# Patient Record
Sex: Female | Born: 1950
Health system: Southern US, Community
[De-identification: ages and names within clinical notes are randomized; demographics above are authoritative.]

## PROBLEM LIST (undated history)

## (undated) DIAGNOSIS — J189 Pneumonia, unspecified organism: Secondary | ICD-10-CM

## (undated) DIAGNOSIS — Z950 Presence of cardiac pacemaker: Secondary | ICD-10-CM

## (undated) DIAGNOSIS — I679 Cerebrovascular disease, unspecified: Secondary | ICD-10-CM

## (undated) DIAGNOSIS — M1990 Unspecified osteoarthritis, unspecified site: Secondary | ICD-10-CM

## (undated) DIAGNOSIS — Z87442 Personal history of urinary calculi: Secondary | ICD-10-CM

## (undated) DIAGNOSIS — C50919 Malignant neoplasm of unspecified site of unspecified female breast: Secondary | ICD-10-CM

## (undated) DIAGNOSIS — R7303 Prediabetes: Secondary | ICD-10-CM

## (undated) DIAGNOSIS — N189 Chronic kidney disease, unspecified: Secondary | ICD-10-CM

## (undated) DIAGNOSIS — I509 Heart failure, unspecified: Secondary | ICD-10-CM

## (undated) DIAGNOSIS — Z95810 Presence of automatic (implantable) cardiac defibrillator: Secondary | ICD-10-CM

## (undated) DIAGNOSIS — I639 Cerebral infarction, unspecified: Secondary | ICD-10-CM

## (undated) DIAGNOSIS — I428 Other cardiomyopathies: Secondary | ICD-10-CM

## (undated) HISTORY — DX: Malignant neoplasm of unspecified site of unspecified female breast: C50.919

## (undated) HISTORY — PX: ABDOMINAL HYSTERECTOMY: SHX81

## (undated) HISTORY — PX: TONSILLECTOMY: SUR1361

## (undated) HISTORY — PX: VARICOSE VEIN SURGERY: SHX832

## (undated) HISTORY — PX: ESOPHAGOGASTRODUODENOSCOPY: SHX1529

## (undated) HISTORY — DX: Cerebrovascular disease, unspecified: I67.9

## (undated) HISTORY — DX: Other cardiomyopathies: I42.8

## (undated) HISTORY — PX: MASTECTOMY: SHX3

## (undated) HISTORY — PX: COLONOSCOPY: SHX174

## (undated) HISTORY — DX: Heart failure, unspecified: I50.9

## (undated) HISTORY — PX: CHOLECYSTECTOMY: SHX55

## (undated) SURGERY — Surgical Case
Anesthesia: *Unknown

---

## 1993-02-10 HISTORY — PX: BREAST RECONSTRUCTION: SHX9

## 2008-02-11 DIAGNOSIS — I639 Cerebral infarction, unspecified: Secondary | ICD-10-CM

## 2008-02-11 HISTORY — PX: CARDIAC DEFIBRILLATOR PLACEMENT: SHX171

## 2008-02-11 HISTORY — DX: Cerebral infarction, unspecified: I63.9

## 2008-03-09 ENCOUNTER — Encounter: Payer: Self-pay | Admitting: Cardiology

## 2008-03-09 ENCOUNTER — Encounter (INDEPENDENT_AMBULATORY_CARE_PROVIDER_SITE_OTHER): Payer: Self-pay | Admitting: Neurology

## 2008-03-09 ENCOUNTER — Inpatient Hospital Stay (HOSPITAL_COMMUNITY): Admission: AD | Admit: 2008-03-09 | Discharge: 2008-03-13 | Payer: Self-pay | Admitting: Neurology

## 2008-03-09 ENCOUNTER — Ambulatory Visit: Payer: Self-pay | Admitting: Internal Medicine

## 2008-03-10 ENCOUNTER — Encounter: Payer: Self-pay | Admitting: Cardiology

## 2008-03-10 ENCOUNTER — Encounter (INDEPENDENT_AMBULATORY_CARE_PROVIDER_SITE_OTHER): Payer: Self-pay | Admitting: Neurology

## 2008-03-15 ENCOUNTER — Ambulatory Visit: Payer: Self-pay | Admitting: Cardiology

## 2008-03-16 DIAGNOSIS — I509 Heart failure, unspecified: Secondary | ICD-10-CM | POA: Insufficient documentation

## 2008-03-16 DIAGNOSIS — R5383 Other fatigue: Secondary | ICD-10-CM

## 2008-03-16 DIAGNOSIS — R0602 Shortness of breath: Secondary | ICD-10-CM | POA: Insufficient documentation

## 2008-03-16 DIAGNOSIS — R609 Edema, unspecified: Secondary | ICD-10-CM

## 2008-03-16 DIAGNOSIS — R5381 Other malaise: Secondary | ICD-10-CM

## 2008-03-23 ENCOUNTER — Encounter: Payer: Self-pay | Admitting: Nurse Practitioner

## 2008-03-23 ENCOUNTER — Ambulatory Visit: Payer: Self-pay | Admitting: Cardiology

## 2008-03-24 ENCOUNTER — Ambulatory Visit: Payer: Self-pay | Admitting: Cardiology

## 2008-03-30 ENCOUNTER — Ambulatory Visit: Payer: Self-pay

## 2008-03-30 ENCOUNTER — Encounter: Payer: Self-pay | Admitting: Cardiology

## 2008-03-31 ENCOUNTER — Ambulatory Visit: Payer: Self-pay | Admitting: Cardiology

## 2008-04-07 ENCOUNTER — Ambulatory Visit: Payer: Self-pay | Admitting: Cardiology

## 2008-04-11 ENCOUNTER — Encounter: Payer: Self-pay | Admitting: Nurse Practitioner

## 2008-04-11 ENCOUNTER — Ambulatory Visit: Payer: Self-pay | Admitting: Internal Medicine

## 2008-04-11 ENCOUNTER — Encounter: Payer: Self-pay | Admitting: Cardiology

## 2008-04-11 DIAGNOSIS — M542 Cervicalgia: Secondary | ICD-10-CM | POA: Insufficient documentation

## 2008-04-11 LAB — CONVERTED CEMR LAB
BUN: 12 mg/dL (ref 6–23)
Chloride: 103 meq/L (ref 96–112)
Creatinine, Ser: 0.7 mg/dL (ref 0.4–1.2)
GFR calc non Af Amer: 92 mL/min

## 2008-04-14 ENCOUNTER — Ambulatory Visit: Payer: Self-pay | Admitting: Cardiology

## 2008-04-18 ENCOUNTER — Ambulatory Visit: Payer: Self-pay | Admitting: Cardiology

## 2008-04-24 ENCOUNTER — Encounter: Payer: Self-pay | Admitting: Cardiology

## 2008-04-25 ENCOUNTER — Ambulatory Visit: Payer: Self-pay | Admitting: Cardiology

## 2008-04-28 ENCOUNTER — Ambulatory Visit: Payer: Self-pay

## 2008-05-04 ENCOUNTER — Encounter: Admission: RE | Admit: 2008-05-04 | Discharge: 2008-05-04 | Payer: Self-pay | Admitting: Hematology and Oncology

## 2008-05-09 ENCOUNTER — Ambulatory Visit: Payer: Self-pay | Admitting: Cardiology

## 2008-05-10 ENCOUNTER — Ambulatory Visit: Payer: Self-pay | Admitting: Cardiology

## 2008-05-11 HISTORY — PX: PACEMAKER INSERTION: SHX728

## 2008-05-17 ENCOUNTER — Telehealth (INDEPENDENT_AMBULATORY_CARE_PROVIDER_SITE_OTHER): Payer: Self-pay | Admitting: *Deleted

## 2008-05-18 ENCOUNTER — Ambulatory Visit: Payer: Self-pay

## 2008-05-18 ENCOUNTER — Encounter: Payer: Self-pay | Admitting: Cardiology

## 2008-05-18 ENCOUNTER — Encounter: Payer: Self-pay | Admitting: Internal Medicine

## 2008-05-18 ENCOUNTER — Ambulatory Visit: Payer: Self-pay | Admitting: Internal Medicine

## 2008-05-18 DIAGNOSIS — I428 Other cardiomyopathies: Secondary | ICD-10-CM | POA: Insufficient documentation

## 2008-05-18 DIAGNOSIS — I639 Cerebral infarction, unspecified: Secondary | ICD-10-CM | POA: Insufficient documentation

## 2008-05-18 DIAGNOSIS — I6350 Cerebral infarction due to unspecified occlusion or stenosis of unspecified cerebral artery: Secondary | ICD-10-CM | POA: Insufficient documentation

## 2008-05-18 DIAGNOSIS — I9589 Other hypotension: Secondary | ICD-10-CM

## 2008-05-18 DIAGNOSIS — I420 Dilated cardiomyopathy: Secondary | ICD-10-CM | POA: Insufficient documentation

## 2008-05-18 HISTORY — DX: Other hypotension: I95.89

## 2008-05-18 LAB — CONVERTED CEMR LAB
BUN: 20 mg/dL (ref 6–23)
Basophils Absolute: 0 10*3/uL (ref 0.0–0.1)
CO2: 30 meq/L (ref 19–32)
Eosinophils Absolute: 0.1 10*3/uL (ref 0.0–0.7)
GFR calc non Af Amer: 68.6 mL/min (ref >60)
Glucose, Bld: 124 mg/dL — ABNORMAL HIGH (ref 70–99)
HCT: 35.9 % — ABNORMAL LOW (ref 36.0–46.0)
Lymphs Abs: 1.5 10*3/uL (ref 0.7–4.0)
MCHC: 34.5 g/dL (ref 30.0–36.0)
MCV: 85.3 fL (ref 78.0–100.0)
Monocytes Absolute: 0.6 10*3/uL (ref 0.1–1.0)
Monocytes Relative: 9.6 % (ref 3.0–12.0)
Platelets: 183 10*3/uL (ref 150.0–400.0)
Potassium: 4.2 meq/L (ref 3.5–5.1)
Prothrombin Time: 24.6 s — ABNORMAL HIGH (ref 10.9–13.3)
RDW: 15.9 % — ABNORMAL HIGH (ref 11.5–14.6)

## 2008-05-26 ENCOUNTER — Observation Stay (HOSPITAL_COMMUNITY): Admission: AD | Admit: 2008-05-26 | Discharge: 2008-05-27 | Payer: Self-pay | Admitting: Internal Medicine

## 2008-05-26 ENCOUNTER — Ambulatory Visit: Payer: Self-pay | Admitting: Internal Medicine

## 2008-05-26 HISTORY — PX: CARDIAC DEFIBRILLATOR PLACEMENT: SHX171

## 2008-05-27 ENCOUNTER — Encounter: Payer: Self-pay | Admitting: Cardiology

## 2008-05-30 ENCOUNTER — Telehealth: Payer: Self-pay | Admitting: Internal Medicine

## 2008-05-30 ENCOUNTER — Ambulatory Visit: Payer: Self-pay | Admitting: Cardiology

## 2008-05-31 ENCOUNTER — Encounter: Payer: Self-pay | Admitting: Internal Medicine

## 2008-06-05 ENCOUNTER — Encounter: Payer: Self-pay | Admitting: Internal Medicine

## 2008-06-05 ENCOUNTER — Ambulatory Visit: Payer: Self-pay | Admitting: Cardiology

## 2008-06-05 ENCOUNTER — Ambulatory Visit: Payer: Self-pay

## 2008-06-06 ENCOUNTER — Ambulatory Visit: Payer: Self-pay | Admitting: Cardiology

## 2008-06-14 ENCOUNTER — Encounter: Payer: Self-pay | Admitting: Cardiology

## 2008-06-20 ENCOUNTER — Ambulatory Visit: Payer: Self-pay | Admitting: Cardiology

## 2008-06-27 ENCOUNTER — Ambulatory Visit: Payer: Self-pay | Admitting: Cardiology

## 2008-07-11 ENCOUNTER — Ambulatory Visit: Payer: Self-pay | Admitting: Cardiology

## 2008-07-25 ENCOUNTER — Ambulatory Visit: Payer: Self-pay | Admitting: Cardiology

## 2008-08-15 ENCOUNTER — Ambulatory Visit: Payer: Self-pay | Admitting: Cardiology

## 2008-08-23 ENCOUNTER — Ambulatory Visit: Payer: Self-pay | Admitting: Internal Medicine

## 2008-08-24 ENCOUNTER — Encounter: Admission: RE | Admit: 2008-08-24 | Discharge: 2008-08-24 | Payer: Self-pay | Admitting: Neurology

## 2008-08-24 ENCOUNTER — Encounter: Payer: Self-pay | Admitting: Cardiology

## 2008-09-05 ENCOUNTER — Encounter: Payer: Self-pay | Admitting: Cardiology

## 2008-09-19 ENCOUNTER — Ambulatory Visit: Payer: Self-pay | Admitting: Cardiology

## 2008-09-25 ENCOUNTER — Encounter: Payer: Self-pay | Admitting: *Deleted

## 2008-10-20 ENCOUNTER — Ambulatory Visit: Payer: Self-pay | Admitting: Cardiology

## 2008-11-26 ENCOUNTER — Encounter: Payer: Self-pay | Admitting: Internal Medicine

## 2008-11-27 ENCOUNTER — Ambulatory Visit: Payer: Self-pay | Admitting: Internal Medicine

## 2008-11-28 ENCOUNTER — Ambulatory Visit: Payer: Self-pay | Admitting: Cardiology

## 2008-12-18 ENCOUNTER — Encounter: Payer: Self-pay | Admitting: Internal Medicine

## 2008-12-26 ENCOUNTER — Ambulatory Visit: Payer: Self-pay | Admitting: Cardiology

## 2009-01-26 ENCOUNTER — Ambulatory Visit: Payer: Self-pay | Admitting: Cardiology

## 2009-01-26 LAB — CONVERTED CEMR LAB: POC INR: 2.7

## 2009-02-15 ENCOUNTER — Encounter: Payer: Self-pay | Admitting: Cardiology

## 2009-02-19 ENCOUNTER — Encounter: Payer: Self-pay | Admitting: Cardiology

## 2009-02-25 ENCOUNTER — Encounter: Payer: Self-pay | Admitting: Internal Medicine

## 2009-02-26 ENCOUNTER — Ambulatory Visit: Payer: Self-pay | Admitting: Internal Medicine

## 2009-03-06 ENCOUNTER — Ambulatory Visit: Payer: Self-pay | Admitting: Cardiology

## 2009-03-06 DIAGNOSIS — Z95810 Presence of automatic (implantable) cardiac defibrillator: Secondary | ICD-10-CM | POA: Insufficient documentation

## 2009-03-06 LAB — CONVERTED CEMR LAB: POC INR: 2.9

## 2009-03-07 ENCOUNTER — Encounter: Payer: Self-pay | Admitting: Internal Medicine

## 2009-04-06 ENCOUNTER — Ambulatory Visit: Payer: Self-pay | Admitting: Cardiology

## 2009-04-06 LAB — CONVERTED CEMR LAB: POC INR: 2.9

## 2009-04-24 ENCOUNTER — Encounter: Payer: Self-pay | Admitting: Cardiology

## 2009-05-15 ENCOUNTER — Ambulatory Visit: Payer: Self-pay | Admitting: Cardiology

## 2009-05-15 LAB — CONVERTED CEMR LAB: POC INR: 2.2

## 2009-06-12 ENCOUNTER — Ambulatory Visit: Payer: Self-pay | Admitting: Internal Medicine

## 2009-06-12 ENCOUNTER — Ambulatory Visit: Payer: Self-pay | Admitting: Cardiology

## 2009-06-12 DIAGNOSIS — I5022 Chronic systolic (congestive) heart failure: Secondary | ICD-10-CM

## 2009-06-12 HISTORY — DX: Chronic systolic (congestive) heart failure: I50.22

## 2009-06-12 LAB — CONVERTED CEMR LAB: POC INR: 2.3

## 2009-07-10 ENCOUNTER — Ambulatory Visit: Payer: Self-pay | Admitting: Cardiology

## 2009-07-10 LAB — CONVERTED CEMR LAB: POC INR: 2.3

## 2009-08-07 ENCOUNTER — Ambulatory Visit: Payer: Self-pay | Admitting: Cardiology

## 2009-08-14 ENCOUNTER — Telehealth (INDEPENDENT_AMBULATORY_CARE_PROVIDER_SITE_OTHER): Payer: Self-pay | Admitting: *Deleted

## 2009-09-04 ENCOUNTER — Ambulatory Visit: Payer: Self-pay | Admitting: Cardiology

## 2009-09-12 ENCOUNTER — Encounter: Payer: Self-pay | Admitting: Internal Medicine

## 2009-09-13 ENCOUNTER — Ambulatory Visit: Payer: Self-pay | Admitting: Internal Medicine

## 2009-09-28 ENCOUNTER — Encounter: Payer: Self-pay | Admitting: Internal Medicine

## 2009-10-02 ENCOUNTER — Ambulatory Visit: Payer: Self-pay | Admitting: Cardiology

## 2009-10-30 ENCOUNTER — Ambulatory Visit: Payer: Self-pay | Admitting: Cardiology

## 2009-11-27 ENCOUNTER — Ambulatory Visit: Payer: Self-pay | Admitting: Cardiology

## 2009-12-13 ENCOUNTER — Ambulatory Visit: Payer: Self-pay | Admitting: Internal Medicine

## 2009-12-14 ENCOUNTER — Encounter: Payer: Self-pay | Admitting: Internal Medicine

## 2009-12-24 ENCOUNTER — Encounter: Payer: Self-pay | Admitting: Internal Medicine

## 2009-12-25 ENCOUNTER — Ambulatory Visit: Payer: Self-pay | Admitting: Cardiology

## 2009-12-25 LAB — CONVERTED CEMR LAB: POC INR: 3.2

## 2010-01-22 ENCOUNTER — Ambulatory Visit: Payer: Self-pay | Admitting: Cardiology

## 2010-01-22 LAB — CONVERTED CEMR LAB: POC INR: 1.8

## 2010-02-19 ENCOUNTER — Ambulatory Visit: Admission: RE | Admit: 2010-02-19 | Discharge: 2010-02-19 | Payer: Self-pay | Source: Home / Self Care

## 2010-02-19 ENCOUNTER — Encounter: Payer: Self-pay | Admitting: Cardiology

## 2010-02-19 LAB — CONVERTED CEMR LAB: POC INR: 3.4

## 2010-03-08 ENCOUNTER — Ambulatory Visit
Admission: RE | Admit: 2010-03-08 | Discharge: 2010-03-08 | Payer: Self-pay | Source: Home / Self Care | Attending: Cardiology | Admitting: Cardiology

## 2010-03-08 ENCOUNTER — Encounter: Payer: Self-pay | Admitting: Cardiology

## 2010-03-12 NOTE — Assessment & Plan Note (Signed)
Summary: 6 MO FU DEC REMINDER-SRS   Visit Type:  Follow-up Referring Provider:  Dr Gala Romney Primary Provider:  Reuel Boom  CC:  follow-up visit.  History of Present Illness: the patient is a 60 year old female with history of nonischemic cardiomyopathy ejection fraction 25%. The patient status post ICD implant. She is in NYHA class I. She is on appropriate medical therapy for heart failure. The patient feels much improved. She denies any chest pain orthopnea PND palpitations or syncope. The patient has been taking drives by herself to [facility]. She has been visiting her grandson.  Patient's also been evaluated by neurology for possible small cerebral aneurysm. However CT scan did not demonstrate an aneurysm. She does have a small area of encephalomalacia or possible prior embolic event. The patient is on Coumadin therapy.  Clinical Review Panels:  Lab Work BUN 20 (05/18/2008) Creatinine 0.9 (05/18/2008)  EKG EKG Impression No significant ST segment change suggestive of ischemia. (05/18/2008)  CXR CXR results IMPRESSION:   Mild cardiomegaly.  No active lung disease.  Mild peribronchial   thickening. (03/08/2008)  Echocardiogram Echocardiogram   LEFT VENTRICLE:   -  The left ventricle was mildly dilated.   -  Overall left ventricular systolic function was markedly         decreased.   -  Left ventricular ejection fraction was estimated , range being 25         % to 30 %.   -  There was severe diffuse left ventricular hypokinesis.   -  Left ventricular wall thickness was normal.     Doppler interpretation(s):   -  Features were consistent with mild diastolic dysfunction.    AORTIC VALVE:   -  The aortic valve was trileaflet.   -  Aortic valve thickness was normal.   -  There was normal aortic valve leaflet excursion.     Doppler interpretation(s):   -  Transaortic velocity was within the normal range.   -  There was no evidence for aortic valve stenosis.  -  There was no significant aortic valvular regurgitation.    AORTA:   -  The aortic root was normal in size.    MITRAL VALVE:   -  There was mild thickening of the mitral valve.     Doppler interpretation(s):   -  There was trivial mitral valvular regurgitation.    LEFT ATRIUM:   -  Left atrial size was normal.    -  There was an atrial septal aneurysm.    RIGHT VENTRICLE:   -  Right ventricular size was normal.   -  Right ventricular systolic function was normal.    PULMONIC VALVE:   Doppler interpretation(s):   -  There was no significant pulmonic regurgitation.    TRICUSPID VALVE:   Doppler interpretation(s):   -  There was trivial tricuspid valvular regurgitation.    RIGHT ATRIUM:   -  Right atrial size was normal.    PERICARDIUM:   -  There was a minimal pericardial effusion, without hemodynamic         compromise. (05/18/2008)  Carotid Studies Carotid Doppler Results SUMMARY:   -  Mild mix plaque noted throughout bilaterally.    -  Vertebral artery flow antegrade bilaterally.   -  No significant right ICA stenosis noted.   -  No significant left ICA stenosis noted. (03/08/2008)    Preventive Screening-Counseling & Management  Alcohol-Tobacco     Smoking Status: never  Current  Problems (verified): 1)  Cva  (ICD-434.91) 2)  Hypotension, Chronic  (ICD-458.1) 3)  Cardiomyopathy, Primary, Dilated  (ICD-425.4) 4)  Neck Pain  (ICD-723.1) 5)  Fatigue / Malaise  (ICD-780.79) 6)  Long Term Use of Aspirin  (ICD-V58.66) 7)  Long Term Antiplatelet Therapy  (ICD-V58.63) 8)  Dyspnea  (ICD-786.05) 9)  Edema  (ICD-782.3) 10)  Congestive Heart Failure, Unspecified  (ICD-428.0)  Current Medications (verified): 1)  Adult Aspirin Ec Low Strength 81 Mg Tbec (Aspirin) .... Qd 2)  Toprol Xl 50 Mg Xr24h-Tab (Metoprolol Succinate) .Marland Kitchen.. 1 Tab Once Daily 3)  Furosemide 20 Mg Tabs (Furosemide) .Marland Kitchen.. 1 Tab Once Daily As Needed 4)  Lanoxin 0.125 Mg Tabs (Digoxin) .... Qd 5)   Warfarin Sodium 5 Mg Tabs (Warfarin Sodium) .... Take As Directed 6)  Lisinopril 10 Mg Tabs (Lisinopril) .... Take 1  Tablet By Mouth Daily 7)  Spironolactone 25 Mg Tabs (Spironolactone) .... Take 1/2 Tablet By Mouth Daily 8)  Macrodantin 50 Mg Caps (Nitrofurantoin Macrocrystal) .... Take 1 Tablet By Mouth Once A Day  Allergies (verified): No Known Drug Allergies  Comments:  Nurse/Medical Assistant: The patient's medications and allergies were reviewed with the patient and were updated in the Medication and Allergy Lists. List reviewed.  Past History:  Past Medical History: Last updated: 03/16/2008 Cancer (Breast) txt'd with Adriamycin 1990's Cerebrovascular Disease S/P Right MCA cardioemoblic infarct  03/09/08 Current Problems:  FATIGUE / MALAISE (ICD-780.79) LONG TERM USE OF ASPIRIN (ICD-V58.66) LONG TERM ANTIPLATELET THERAPY (ICD-V58.63) DYSPNEA (ICD-786.05) EDEMA (ICD-782.3) CONGESTIVE HEART FAILURE, UNSPECIFIED (ICD-428.0)  Family History: Last updated: 03/16/2008 Family History of Cardiomyopathy:  Brother S/P heart transplant  Family History of CVA or Stroke:   Social History: Last updated: 03/16/2008 Married lives in [place] w/husband Tobacco Use - No.  Alcohol Use - no Drug Use - no  Review of Systems  The patient denies fatigue, malaise, fever, weight gain/loss, vision loss, decreased hearing, hoarseness, chest pain, palpitations, shortness of breath, prolonged cough, wheezing, sleep apnea, coughing up blood, abdominal pain, blood in stool, nausea, vomiting, diarrhea, heartburn, incontinence, blood in urine, muscle weakness, joint pain, leg swelling, rash, skin lesions, headache, fainting, dizziness, depression, anxiety, enlarged lymph nodes, easy bruising or bleeding, and environmental allergies.    Vital Signs:  Patient profile:   60 year old female Height:      68 inches Weight:      153 pounds BMI:     23.35 Pulse rate:   66 / minute BP sitting:   103 / 69   (left arm) Cuff size:   regular  Vitals Entered By: Carlye Grippe (March 06, 2009 10:03 AM) CC: follow-up visit   Physical Exam  Additional Exam:  General: Well-developed, well-nourished in no distress head: Normocephalic and atraumatic eyes PERRLA/EOMI intact, conjunctiva and lids normal nose: No deformity or lesions mouth normal dentition, normal posterior pharynx neck: Supple, no JVD.  No masses, thyromegaly or abnormal cervical nodes. No carotid bruits lungs: Normal breath sounds bilaterally without wheezing.  Normal percussion heart: regular rate and rhythm with normal S1 and S2, no S3 or S4.  PMI is normal.  No pathological murmurs abdomen: Normal bowel sounds, abdomen is soft and nontender without masses, organomegaly or hernias noted.  No hepatosplenomegaly musculoskeletal: Back normal, normal gait muscle strength and tone normal pulsus: Pulse is normal in all 4 extremities Extremities: No peripheral pitting edema neurologic: Alert and oriented x 3 skin: Intact without lesions or rashes cervical nodes: No significant adenopathy psychologic: Normal affect  EKG  Procedure date:  03/06/2009  Findings:      atrial paced rhythm normal ventricular conduction. Heart rate 64 beats per minute PR interval 182 ms QRS duration 122 ms   ICD Specifications Following MD:  Sherryl Manges, MD     Referring MD:  [facility] ICD Vendor:  [employer]     ICD Model Number:  [id number]     ICD Serial Number:  [id number] ICD DOI:  05/26/2008     ICD Implanting MD:  Sherryl Manges, MD  Lead 1:    Location: RA     DOI: 05/26/2008     Model #: [id number]     Serial #: [id number]     Status: active Lead 2:    Location: RV     DOI: 05/26/2008     Model #: [id number]     Serial #: [id number]     Status: active  Indications::  NICM   ICD Follow Up ICD Dependent:  No      Episodes Coumadin:  No  Brady Parameters Mode DDI     Lower Rate Limit:  60     PAV 300      Tachy Zones VF:  181     VT:  214     VT1:  240      Impression & Recommendations:  Problem # 1:  CARDIOMYOPATHY, PRIMARY, DILATED (ICD-425.4) patient NYHA class I. She has AV sequential pacing. Her QRS interval 1 20 ms . Currently no definite indication for CRT upgrade.the patient is on appropriate therapy for heart failure. On her next visit we will obtain a 2-D echocardiogramlaboratory work from February 17, 1999 were reviewed. There was global renal function. Potassium on Aldactone is 3.9 and normal CBC was within normal limits LDL is 102 with an HDL cholesterol 50 The following medications were removed from the medication list:    Aldactone 25 Mg Tabs (Spironolactone) .Marland Kitchen... Take 1 tablet by mouth once a day Her updated medication list for this problem includes:    Adult Aspirin Ec Low Strength 81 Mg Tbec (Aspirin) ..... Qd    Toprol Xl 50 Mg Xr24h-tab (Metoprolol succinate) .Marland Kitchen... 1 tab once daily    Furosemide 20 Mg Tabs (Furosemide) .Marland Kitchen... 1 tab once daily as needed    Lanoxin 0.125 Mg Tabs (Digoxin) ..... Qd    Warfarin Sodium 5 Mg Tabs (Warfarin sodium) .Marland Kitchen... Take as directed    Lisinopril 10 Mg Tabs (Lisinopril) .Marland Kitchen... Take 1  tablet by mouth daily    Spironolactone 25 Mg Tabs (Spironolactone) .Marland Kitchen... Take 1/2 tablet by mouth daily  Problem # 2:  CVA (ICD-434.91) the patient has small area of encephalomalacia on her CT scan but no evidence of aneurysm. The patient will continue on Coumadin. She reports no complications. Her updated medication list for this problem includes:    Adult Aspirin Ec Low Strength 81 Mg Tbec (Aspirin) ..... Qd    Warfarin Sodium 5 Mg Tabs (Warfarin sodium) .Marland Kitchen... Take as directed  Problem # 3:  IMPLANTATION OF DEFIBRILLATOR, HX OF (ICD-V45.02) the patient has dual-chamber defibrillator. There was atrial pacing with normal ventricular conduction. We will stagger to patient's future appointments every 6 months with Dr. Graciela Husbands and myself.  Other Orders: EKG w/ Interpretation (93000)  Patient Instructions: 1)   Your physician recommends that you continue on your current medications as directed. Please refer to the Current Medication list given to you today. 2)  Follow up in  1 year.

## 2010-03-12 NOTE — Medication Information (Signed)
Summary: ccr-lr  Anticoagulant Therapy  Managed by: Vashti Hey, RN Referring MD: Donnella Bi PCP: Billie Lade MD: Diona Browner MD, Remi Deter Indication 1: CVA--stroke (ICD-436) Indication 2: Cerebral Artery Thrombosis (ICD-434.0) [facility] Site: [place] INR POC 3.2  Dietary changes: no    Health status changes: no    Bleeding/hemorrhagic complications: no    Recent/future hospitalizations: no    Any changes in medication regimen? no    Recent/future dental: no  Any missed doses?: no       Is patient compliant with meds? yes       Allergies: No Known Drug Allergies  Anticoagulation Management History:      The patient is taking warfarin and comes in today for a routine follow up visit.  Positive risk factors for bleeding include history of CVA/TIA.  Negative risk factors for bleeding include an age less than 59 years old.  The bleeding index is 'intermediate risk'.  Positive CHADS2 values include History of CHF and Prior Stroke/CVA/TIA.  Negative CHADS2 values include Age > 59 years old.  The start date was 03/10/2008.  Her last INR was 2.4 ratio.  Anticoagulation responsible provider: Diona Browner MD, Remi Deter.  INR POC: 3.2.  Cuvette Lot#: [id number].  Exp: 10/11.    Anticoagulation Management Assessment/Plan:      The patient's current anticoagulation dose is Warfarin sodium 5 mg tabs: take as directed.  The target INR is 2 - 3.  The next INR is due 01/22/2010.  Anticoagulation instructions were given to patient.  Results were reviewed/authorized by Vashti Hey, RN.  She was notified by Vashti Hey RN.         Prior Anticoagulation Instructions: INR 3.1 Continue coumadin 5mg  once daily except 2.5mg  on Mondays and Fridays  Current Anticoagulation Instructions: INR 3.2 Continue coumadin 5mg  once daily except 2.5mg  on Mondays and Fridays Increase greens

## 2010-03-12 NOTE — Cardiovascular Report (Signed)
Summary: Office Visit Remote   Office Visit Remote   Imported By: Roderic Ovens 10/01/2009 13:21:16  _____________________________________________________________________  External Attachment:    Type:   Image     Comment:   External Document

## 2010-03-12 NOTE — Medication Information (Signed)
Summary: ccr-lr  Anticoagulant Therapy  Managed by: Vashti Hey, RN Referring MD: Donnella Bi PCP: Billie Lade MD: Antoine Poche MD, Fayrene Fearing Indication 1: CVA--stroke (ICD-436) Indication 2: Cerebral Artery Thrombosis (ICD-434.0) Lab [facility] Site: [place] INR POC 2.5  Dietary changes: no    Health status changes: no    Bleeding/hemorrhagic complications: no    Recent/future hospitalizations: no    Any changes in medication regimen? no    Recent/future dental: no  Any missed doses?: no       Is patient compliant with meds? yes       Allergies: No Known Drug Allergies  Anticoagulation Management History:      The patient is taking warfarin and comes in today for a routine follow up visit.  Positive risk factors for bleeding include history of CVA/TIA.  Negative risk factors for bleeding include an age less than 24 years old.  The bleeding index is 'intermediate risk'.  Positive CHADS2 values include History of CHF and Prior Stroke/CVA/TIA.  Negative CHADS2 values include Age > 69 years old.  The start date was 03/10/2008.  Her last INR was 2.4 ratio.  Anticoagulation responsible provider: Antoine Poche MD, Fayrene Fearing.  INR POC: 2.5.  Cuvette Lot#: [id number].  Exp: 10/11.    Anticoagulation Management Assessment/Plan:      The patient's current anticoagulation dose is Warfarin sodium 5 mg tabs: take as directed.  The target INR is 2 - 3.  The next INR is due 08/07/2009.  Anticoagulation instructions were given to patient.  Results were reviewed/authorized by Vashti Hey, RN.  She was notified by Vashti Hey RN.         Prior Anticoagulation Instructions: INR 2.3 Continue coumadin 5mg  once daily except 2.5mg  on Mondays and Fridays  Current Anticoagulation Instructions: INR 2.5 Continue coumadin 5mg  once daily except 2.5mg  on Mondays and Fridays

## 2010-03-12 NOTE — Medication Information (Signed)
Summary: ccr-lr  Anticoagulant Therapy  Managed by: Vashti Hey, RN Referring MD: Donnella Bi PCP: Billie Lade MD: Andee Lineman MD, Michelle Piper Indication 1: CVA--stroke (ICD-436) Indication 2: Cerebral Artery Thrombosis (ICD-434.0) Lab [facility] [place] Site: [place] INR POC 3.4  Dietary changes: no    Health status changes: no    Bleeding/hemorrhagic complications: no    Recent/future hospitalizations: no    Any changes in medication regimen? no    Recent/future dental: no  Any missed doses?: no       Is patient compliant with meds? yes       Allergies: No Known Drug Allergies  Anticoagulation Management History:      The patient is taking warfarin and comes in today for a routine follow up visit.  Positive risk factors for bleeding include history of CVA/TIA.  Negative risk factors for bleeding include an age less than 29 years old.  The bleeding index is 'intermediate risk'.  Positive CHADS2 values include History of CHF and Prior Stroke/CVA/TIA.  Negative CHADS2 values include Age > 24 years old.  The start date was 03/10/2008.  Her last INR was 2.4 ratio.  Anticoagulation responsible provider: Andee Lineman MD, Michelle Piper.  INR POC: 3.4.  Cuvette Lot#: [id number].  Exp: 10/11.    Anticoagulation Management Assessment/Plan:      The patient's current anticoagulation dose is Warfarin sodium 5 mg tabs: take as directed.  The target INR is 2 - 3.  The next INR is due 10/30/2009.  Anticoagulation instructions were given to patient.  Results were reviewed/authorized by Vashti Hey, RN.  She was notified by Vashti Hey RN.         Prior Anticoagulation Instructions: INR 2.5 Continue coumadin 5mg  once daily except 2.5mg  on Mondays and Fridays  Current Anticoagulation Instructions: INR 3.4 Hold coumadin tonight then resume 5mg  once daily except 2.5mg  on Mondays and Fridays

## 2010-03-12 NOTE — Letter (Signed)
Summary: [name] [name] [facility] OFFICE NOTE  [facility] OFFICE NOTE   Imported By: Claudette Laws 05/01/2009 09:14:11  _____________________________________________________________________  External Attachment:    Type:   Image     Comment:   External Document

## 2010-03-12 NOTE — Medication Information (Signed)
Summary: ccr-lr  Anticoagulant Therapy  Managed by: Vashti Hey, RN Referring MD: Donnella Bi PCP: Lia Hopping Supervising MD: Andee Lineman MD, Michelle Piper Indication 1: CVA--stroke (ICD-436) Indication 2: Cerebral Artery Thrombosis (ICD-434.0) Lab Used: [facility] [place] Site: [place] INR POC 2.9  Dietary changes: no    Health status changes: no    Bleeding/hemorrhagic complications: no    Recent/future hospitalizations: no    Any changes in medication regimen? no    Recent/future dental: no  Any missed doses?: no       Is patient compliant with meds? yes       Allergies: No Known Drug Allergies  Anticoagulation Management History:      The patient is taking warfarin and comes in today for a routine follow up visit.  Positive risk factors for bleeding include history of CVA/TIA.  Negative risk factors for bleeding include an age less than 75 years old.  The bleeding index is 'intermediate risk'.  Positive CHADS2 values include History of CHF and Prior Stroke/CVA/TIA.  Negative CHADS2 values include Age > 13 years old.  The start date was 03/10/2008.  Her last INR was 2.4 ratio.  Anticoagulation responsible provider: Andee Lineman MD, Michelle Piper.  INR POC: 2.9.  Cuvette Lot#: [id number].  Exp: 10/11.    Anticoagulation Management Assessment/Plan:      The patient's current anticoagulation dose is Warfarin sodium 5 mg tabs: take as directed.  The target INR is 2 - 3.  The next INR is due 04/06/2009.  Anticoagulation instructions were given to patient.  Results were reviewed/authorized by Vashti Hey, RN.  She was notified by Vashti Hey RN.         Prior Anticoagulation Instructions: INR 2.7 Continue coumadin 5mg  once daily except 2.5mg  on Mondays and Fridays Pt requests recheck on 03/06/09 at Dr Andee Lineman appt  Current Anticoagulation Instructions: INR 2.9 Continue coumadin 5mg  once daily except 2.5mg  on Mondays and Fridays

## 2010-03-12 NOTE — Medication Information (Signed)
Summary: ccr-lr  Anticoagulant Therapy  Managed by: Vashti Hey, RN Referring MD: Donnella Bi PCP: Billie Lade MD: Antoine Poche MD, Fayrene Fearing Indication 1: CVA--stroke (ICD-436) Indication 2: Cerebral Artery Thrombosis (ICD-434.0) Lab [facility]: [facility] [place] Site: [place] INR POC 2.0  Dietary changes: no    Health status changes: no    Bleeding/hemorrhagic complications: no    Recent/future hospitalizations: no    Any changes in medication regimen? no    Recent/future dental: no  Any missed doses?: yes     Details: missed 1 dose last week  Is patient compliant with meds? yes       Allergies: No Known Drug Allergies  Anticoagulation Management History:      The patient is taking warfarin and comes in today for a routine follow up visit.  Positive risk factors for bleeding include history of CVA/TIA.  Negative risk factors for bleeding include an age less than 50 years old.  The bleeding index is 'intermediate risk'.  Positive CHADS2 values include History of CHF and Prior Stroke/CVA/TIA.  Negative CHADS2 values include Age > 77 years old.  The start date was 03/10/2008.  Her last INR was 2.4 ratio.  Anticoagulation responsible provider: Antoine Poche MD, Fayrene Fearing.  INR POC: 2.0.  Cuvette Lot#: [id number].  Exp: 10/11.    Anticoagulation Management Assessment/Plan:      The patient's current anticoagulation dose is Warfarin sodium 5 mg tabs: take as directed.  The target INR is 2 - 3.  The next INR is due 11/27/2009.  Anticoagulation instructions were given to patient.  Results were reviewed/authorized by Vashti Hey, RN.  She was notified by Vashti Hey RN.         Prior Anticoagulation Instructions: INR 3.4 Hold coumadin tonight then resume 5mg  once daily except 2.5mg  on Mondays and Fridays  Current Anticoagulation Instructions: INR 2.0 Take coumadin 7.5mg  tonight then resume 5mg  once daily except 2.5mg  on Mondays and Fridays

## 2010-03-12 NOTE — Medication Information (Signed)
Summary: ccr-lr  Anticoagulant Therapy  Managed by: Vashti Hey, RN Referring MD: Donnella Bi PCP: Billie Lade MD: Andee Lineman MD, Michelle Piper Indication 1: CVA--stroke (ICD-436) Indication 2: Cerebral Artery Thrombosis (ICD-434.0) Lab [facility] [place] Site: [place] INR POC 2.5  Dietary changes: no    Health status changes: no    Bleeding/hemorrhagic complications: no    Recent/future hospitalizations: no    Any changes in medication regimen? no    Recent/future dental: no  Any missed doses?: no       Is patient compliant with meds? yes       Allergies: No Known Drug Allergies  Anticoagulation Management History:      The patient is taking warfarin and comes in today for a routine follow up visit.  Positive risk factors for bleeding include history of CVA/TIA.  Negative risk factors for bleeding include an age less than 39 years old.  The bleeding index is 'intermediate risk'.  Positive CHADS2 values include History of CHF and Prior Stroke/CVA/TIA.  Negative CHADS2 values include Age > 23 years old.  The start date was 03/10/2008.  Her last INR was 2.4 ratio.  Anticoagulation responsible provider: Andee Lineman MD, Michelle Piper.  INR POC: 2.5.  Cuvette Lot#: [id number].  Exp: 10/11.    Anticoagulation Management Assessment/Plan:      The patient's current anticoagulation dose is Warfarin sodium 5 mg tabs: take as directed.  The target INR is 2 - 3.  The next INR is due 10/02/2009.  Anticoagulation instructions were given to patient.  Results were reviewed/authorized by Vashti Hey, RN.  She was notified by Vashti Hey RN.         Prior Anticoagulation Instructions: INR 2.5 Continue coumadin 5mg  once daily except 2.5mg  on Mondays and Fridays  Current Anticoagulation Instructions: Same as Prior Instructions.

## 2010-03-12 NOTE — Cardiovascular Report (Signed)
Summary: Office Visit   Office Visit   Imported By: Roderic Ovens 06/15/2009 14:18:10  _____________________________________________________________________  External Attachment:    Type:   Image     Comment:   External Document

## 2010-03-12 NOTE — Medication Information (Signed)
Summary: ccr-lr  Anticoagulant Therapy  Managed by: Vashti Hey, RN Referring MD: Donnella Bi PCP: Billie Lade MD: Myrtis Ser MD, Tinnie Gens Indication 1: CVA--stroke (ICD-436) Indication 2: Cerebral Artery Thrombosis (ICD-434.0) [facility] Site: [facility] INR POC 2.9  Dietary changes: no    Health status changes: no    Bleeding/hemorrhagic complications: no    Recent/future hospitalizations: no    Any changes in medication regimen? no    Recent/future dental: no  Any missed doses?: no       Is patient compliant with meds? yes       Allergies: No Known Drug Allergies  Anticoagulation Management History:      The patient is taking warfarin and comes in today for a routine follow up visit.  Positive risk factors for bleeding include history of CVA/TIA.  Negative risk factors for bleeding include an age less than 6 years old.  The bleeding index is 'intermediate risk'.  Positive CHADS2 values include History of CHF and Prior Stroke/CVA/TIA.  Negative CHADS2 values include Age > 9 years old.  The start date was 03/10/2008.  Her last INR was 2.4 ratio.  Anticoagulation responsible provider: Myrtis Ser MD, Tinnie Gens.  INR POC: 2.9.  Cuvette Lot#: [id number].  Exp: 10/11.    Anticoagulation Management Assessment/Plan:      The patient's current anticoagulation dose is Warfarin sodium 5 mg tabs: take as directed.  The target INR is 2 - 3.  The next INR is due 05/08/2009.  Anticoagulation instructions were given to patient.  Results were reviewed/authorized by Vashti Hey, RN.  She was notified by Vashti Hey RN.         Prior Anticoagulation Instructions: INR 2.9 Continue coumadin 5mg  once daily except 2.5mg  on Mondays and Fridays  Current Anticoagulation Instructions: Same as Prior Instructions.

## 2010-03-12 NOTE — Medication Information (Signed)
Summary: ccr-lr  Anticoagulant Therapy  Managed by: [name] Hey, RN Referring MD: Donnella Bi PCP: Billie Lade MD: Diona Browner MD, Remi Deter Indication 1: CVA--stroke (ICD-436) Indication 2: Cerebral Artery Thrombosis (ICD-434.0) Lab [facility] Site: [facility] INR POC 2.3  Dietary changes: no    Health status changes: no    Bleeding/hemorrhagic complications: no    Recent/future hospitalizations: no    Any changes in medication regimen? no    Recent/future dental: no  Any missed doses?: no       Is patient compliant with meds? yes       Allergies: No Known Drug Allergies  Anticoagulation Management History:      The patient is taking warfarin and comes in today for a routine follow up visit.  Positive risk factors for bleeding include history of CVA/TIA.  Negative risk factors for bleeding include an age less than 108 years old.  The bleeding index is 'intermediate risk'.  Positive CHADS2 values include History of CHF and Prior Stroke/CVA/TIA.  Negative CHADS2 values include Age > 11 years old.  The start date was 03/10/2008.  Her last INR was 2.4 ratio.  Anticoagulation responsible provider: Diona Browner MD, Remi Deter.  INR POC: 2.3.  Cuvette Lot#: [id number].  Exp: 10/11.    Anticoagulation Management Assessment/Plan:      The patient's current anticoagulation dose is Warfarin sodium 5 mg tabs: take as directed.  The target INR is 2 - 3.  The next INR is due 07/10/2009.  Anticoagulation instructions were given to patient.  Results were reviewed/authorized by [name] Hey, RN.  She was notified by [name] Hey RN.         Prior Anticoagulation Instructions: INR 2.2 Continue coumadin 5mg  once daily except 2.5mg  on M,F  Current Anticoagulation Instructions: INR 2.3 Continue coumadin 5mg  once daily except 2.5mg  on M,F

## 2010-03-12 NOTE — Progress Notes (Signed)
Summary: REQUEST REFILL K+  Phone Note Outgoing Call   Call placed by: Carlye Grippe,  August 14, 2009 8:53 AM Call placed to: Patient Summary of Call: Called patient inquiring about the request for K+ and that it wasn't on her list when she came in last to see [facility]. Patient advised nurse that she spoke with MD last week out in hallway and informed him that she was having some additional swelling and was now using the lasix daily and she said MD informed her to make sure she uses her K+ along with this. Nurse advised patient that a refill would have to be authorized by a provider since MD was out of the office to confirm this.  recommend a BMET prior to adding supplemental K.Nelida Meuse, PA-C  August 15, 2009 12:41 PM  Initial call taken by: Carlye Grippe,  August 14, 2009 8:55 AM  Follow-up for Phone Call        left message on machine to call office.    Follow-up by: Carlye Grippe,  August 15, 2009 2:31 PM  Additional Follow-up for Phone Call Additional follow up Details #1::        Patient informed of the above and does'nt want labs and wants message to be sent to [facility] on next week upon his return to the office.  Additional Follow-up by: Carlye Grippe,  August 15, 2009 4:14 PM    Additional Follow-up for Phone Call Additional follow up Details #2::    Tell patient she does not need KDur because she is on spironolactone. I was probably not aware of this when I spoke to her.  Lewayne Bunting, MD, [facility]  August 20, 2009 11:31 AM    Appended Document: REQUEST REFILL K+ Left message to return call.  Appended Document: REQUEST REFILL K+ Patient notified.

## 2010-03-12 NOTE — Letter (Signed)
Summary: Remote Device Check  [facility], Main Office  [address]   [place], [place] [address]   Phone: [phone]  Fax: [phone]     March 07, 2009 MRN: [id number]   [name] [name] [address] [place], [place]  [address]   Dear [name],   Your remote transmission was recieved and reviewed by your physician.  All diagnostics were within normal limits for you.    ___X___Your next office visit is scheduled for:  APRIL 2011. Dr Graciela Husbands is no longer seeing patients in our [place] office.  His partner, Dr Johney Frame, has appointment availability in [place].  If you would prefer to be seen in [place], please call [phone] to schedule an appointment with Dr Johney Frame.  If you would prefer to see Dr Graciela Husbands in [place], please call [phone].         Sincerely,  Proofreader

## 2010-03-12 NOTE — Medication Information (Signed)
Summary: ccr-lr  Anticoagulant Therapy  Managed by: Vashti Hey, RN Referring MD: Donnella Bi PCP: Billie Lade MD: Antoine Poche MD, Fayrene Fearing Indication 1: CVA--stroke (ICD-436) Indication 2: Cerebral Artery Thrombosis (ICD-434.0) Lab [facility] Site: [facility] INR POC 2.2  Dietary changes: no    Health status changes: no    Bleeding/hemorrhagic complications: no    Recent/future hospitalizations: no    Any changes in medication regimen? no    Recent/future dental: no  Any missed doses?: yes     Details: missed 1 dose 10 days ago  Is patient compliant with meds? yes       Allergies: No Known Drug Allergies  Anticoagulation Management History:      The patient is taking warfarin and comes in today for a routine follow up visit.  Positive risk factors for bleeding include history of CVA/TIA.  Negative risk factors for bleeding include an age less than 47 years old.  The bleeding index is 'intermediate risk'.  Positive CHADS2 values include History of CHF and Prior Stroke/CVA/TIA.  Negative CHADS2 values include Age > 42 years old.  The start date was 03/10/2008.  Her last INR was 2.4 ratio.  Anticoagulation responsible provider: Antoine Poche MD, Fayrene Fearing.  INR POC: 2.2.  Cuvette Lot#: [id number].  Exp: 10/11.    Anticoagulation Management Assessment/Plan:      The patient's current anticoagulation dose is Warfarin sodium 5 mg tabs: take as directed.  The target INR is 2 - 3.  The next INR is due 06/12/2009.  Anticoagulation instructions were given to patient.  Results were reviewed/authorized by Vashti Hey, RN.  She was notified by Vashti Hey RN.         Prior Anticoagulation Instructions: INR 2.9 Continue coumadin 5mg  once daily except 2.5mg  on Mondays and Fridays  Current Anticoagulation Instructions: INR 2.2 Continue coumadin 5mg  once daily except 2.5mg  on M,F

## 2010-03-12 NOTE — Medication Information (Signed)
Summary: ccr-lr  Anticoagulant Therapy  Managed by: Vashti Hey, RN Referring MD: Donnella Bi PCP: Billie Lade MD: Antoine Poche MD, Fayrene Fearing Indication 1: CVA--stroke (ICD-436) Indication 2: Cerebral Artery Thrombosis (ICD-434.0) Lab [facility] Site: [facility] INR POC 2.3  Dietary changes: no    Health status changes: no    Bleeding/hemorrhagic complications: no    Recent/future hospitalizations: no    Any changes in medication regimen? no    Recent/future dental: no  Any missed doses?: yes     Details: missed 1 dose  Is patient compliant with meds? yes       Allergies: No Known Drug Allergies  Anticoagulation Management History:      The patient is taking warfarin and comes in today for a routine follow up visit.  Positive risk factors for bleeding include history of CVA/TIA.  Negative risk factors for bleeding include an age less than 10 years old.  The bleeding index is 'intermediate risk'.  Positive CHADS2 values include History of CHF and Prior Stroke/CVA/TIA.  Negative CHADS2 values include Age > 18 years old.  The start date was 03/10/2008.  Her last INR was 2.4 ratio.  Anticoagulation responsible provider: Antoine Poche MD, Fayrene Fearing.  INR POC: 2.3.  Cuvette Lot#: [id number].  Exp: 10/11.    Anticoagulation Management Assessment/Plan:      The patient's current anticoagulation dose is Warfarin sodium 5 mg tabs: take as directed.  The target INR is 2 - 3.  The next INR is due 08/07/2009.  Anticoagulation instructions were given to patient.  Results were reviewed/authorized by Vashti Hey, RN.  She was notified by Vashti Hey RN.         Prior Anticoagulation Instructions: INR 2.3 Continue coumadin 5mg  once daily except 2.5mg  on M,F  Current Anticoagulation Instructions: INR 2.3 Continue coumadin 5mg  once daily except 2.5mg  on Mondays and Fridays

## 2010-03-12 NOTE — Letter (Signed)
Summary: Remote Device Check  [facility], Main Office  [address]   [place], [place] [address]   Phone: [phone]  Fax: [phone]     December 24, 2009 MRN: [id number]   ANDREINA OUTTEN [address] [place], [place]  [address]   Dear Ms. CLAGG,   Your remote transmission was recieved and reviewed by your physician.  All diagnostics were within normal limits for you.  __X___Your next transmission is scheduled for:  03-14-2010.  Please transmit at any time this day.  If you have a wireless device your transmission will be sent automatically.   Sincerely,  Vella Kohler

## 2010-03-12 NOTE — Cardiovascular Report (Signed)
Summary: [facility]/ OFFICE NOTE  [facility]/ OFFICE NOTE   Imported By: Dorise Hiss 03/05/2009 16:41:09  _____________________________________________________________________  External Attachment:    Type:   Image     Comment:   External Document

## 2010-03-12 NOTE — Assessment & Plan Note (Signed)
Summary: defib [email or web address]   Referring Provider:  Dr Gala Romney Primary Provider:  Reuel Boom   History of Present Illness: as is [name] [name] is seen in followup history of nonischemic cardiomyopathy ejection fraction 25%. The patient status post ICD implant. She is in NYHA class I. She is on appropriate medical therapy for heart failure. The patient denies SOB, chest pain, edema or palpitations   Current Medications (verified): 1)  Adult Aspirin Ec Low Strength 81 Mg Tbec (Aspirin) .... Qd 2)  Toprol Xl 50 Mg Xr24h-Tab (Metoprolol Succinate) .Marland Kitchen.. 1 Tab Once Daily 3)  Furosemide 20 Mg Tabs (Furosemide) .Marland Kitchen.. 1 Tab Once Daily As Needed 4)  Lanoxin 0.125 Mg Tabs (Digoxin) .... Take 1 Tablet By Mouth Once A Day 5)  Warfarin Sodium 5 Mg Tabs (Warfarin Sodium) .... Take As Directed 6)  Lisinopril 10 Mg Tabs (Lisinopril) .... Take 1  Tablet By Mouth Daily 7)  Spironolactone 25 Mg Tabs (Spironolactone) .... Take 1/2 Tablet By Mouth Daily 8)  Macrodantin 50 Mg Caps (Nitrofurantoin Macrocrystal) .... Take 1 Tablet By Mouth Once A Day  Allergies (verified): No Known Drug Allergies  Past History:  Past Medical History: Last updated: 03/16/2008 Cancer (Breast) txt'd with Adriamycin 1990's Cerebrovascular Disease S/P Right MCA cardioemoblic infarct  03/09/08 Current Problems:  FATIGUE / MALAISE (ICD-780.79) LONG TERM USE OF ASPIRIN (ICD-V58.66) LONG TERM ANTIPLATELET THERAPY (ICD-V58.63) DYSPNEA (ICD-786.05) EDEMA (ICD-782.3) CONGESTIVE HEART FAILURE, UNSPECIFIED (ICD-428.0)  Family History: Last updated: 03/16/2008 Family History of Cardiomyopathy:  Brother S/P heart transplant  Family History of CVA or Stroke:   Social History: Last updated: 03/16/2008 Married lives in [place] w/husband Tobacco Use - No.  Alcohol Use - no Drug Use - no  Vital Signs:  Patient profile:   60 year old female Height:      68 inches Weight:      156 pounds BMI:     23.81 Pulse rate:   67 /  minute Pulse rhythm:   regular BP sitting:   105 / 63  (left arm) Cuff size:   regular  Vitals Entered By: Judithe Modest CMA (Jun 12, 2009 12:43 PM)  Physical Exam  General:  The patient was alert and oriented in no acute distress. HEENT Normal.  Neck veins were flat, carotids were brisk.  Lungs were clear.  Heart sounds were regular without murmurs or gallops.  Abdomen was soft with active bowel sounds. There is no clubbing cyanosis or edema. Skin Warm and dry    PPM Specifications Following MD:  Sherryl Manges, MD       PPM Follow Up Remote Check?  No   PPM Device Measurements Atrium  Threshold:   V at   msec Right Ventricle  Threshold:   V at   msec  Parameters Mode:  DDDR      ICD Specifications Following MD:  Sherryl Manges, MD     Referring MD:  [facility] ICD Vendor:  [employer]     ICD Model Number:  [id number]     ICD Serial Number:  [id number] ICD DOI:  05/26/2008     ICD Implanting MD:  Sherryl Manges, MD  Lead 1:    Location: RA     DOI: 05/26/2008     Model #: [id number]     Serial #: [id number]     Status: active Lead 2:    Location: RV     DOI: 05/26/2008     Model #: [id number]     Serial #: [id number]  Status: active  Indications::  NICM   ICD Follow Up Battery Voltage:  3.19 V     Charge Time:  12.4 seconds     Battery Est. Longevity:  6.1-6.7 YRS Underlying rhythm:  SR ICD Dependent:  No       ICD Device Measurements Atrium:  Amplitude: 3.1 mV, Impedance: 440 ohms, Threshold: 0.5 V at 0.5 msec Right Ventricle:  Amplitude: 12.0  mV, Impedance: 540 ohms, Threshold: 0.75 V at 0.5 msec Shock Impedance: 65 ohms   Episodes MS Episodes:  0     Percent Mode Switch:  0     Coumadin:  No Shock:  0     ATP:  0     Nonsustained:  0     Atrial Therapies:  0 Atrial Pacing:  39%     Ventricular Pacing:  5.6%  Brady Parameters Mode DDI     Lower Rate Limit:  60     PAV 300      Tachy Zones VF:  240     VT:  214     VT1:  181     Next Remote Date:  09/13/2009     Tech Comments:   NORMAL DEVICE FUNCTION .  NO EPISODES.  NO CHANGES MADE. MERLIN CHECK 09-13-09. KRISTIN BANKSTON  Impression & Recommendations:  Problem # 1:  IMPLANTATION OF DEFIBRILLATOR, HX OF (ICD-V45.02) Device parameters and data were reviewed and no changes were made  Problem # 2:  CARDIOMYOPATHY, PRIMARY, DILATED (ICD-425.4) stable on her current extensive medications Her updated medication list for this problem includes:    Adult Aspirin Ec Low Strength 81 Mg Tbec (Aspirin) ..... Qd    Toprol Xl 50 Mg Xr24h-tab (Metoprolol succinate) .Marland Kitchen... 1 tab once daily    Furosemide 20 Mg Tabs (Furosemide) .Marland Kitchen... 1 tab once daily as needed    Lanoxin 0.125 Mg Tabs (Digoxin) .Marland Kitchen... Take 1 tablet by mouth once a day    Warfarin Sodium 5 Mg Tabs (Warfarin sodium) .Marland Kitchen... Take as directed    Lisinopril 10 Mg Tabs (Lisinopril) .Marland Kitchen... Take 1  tablet by mouth daily    Spironolactone 25 Mg Tabs (Spironolactone) .Marland Kitchen... Take 1/2 tablet by mouth daily  Problem # 3:  SYSTOLIC HEART FAILURE, CHRONIC (ICD-428.22) patient has stable class II heart failure on her current medications. Her updated medication list for this problem includes:    Adult Aspirin Ec Low Strength 81 Mg Tbec (Aspirin) ..... Qd    Toprol Xl 50 Mg Xr24h-tab (Metoprolol succinate) .Marland Kitchen... 1 tab once daily    Furosemide 20 Mg Tabs (Furosemide) .Marland Kitchen... 1 tab once daily as needed    Lanoxin 0.125 Mg Tabs (Digoxin) .Marland Kitchen... Take 1 tablet by mouth once a day    Warfarin Sodium 5 Mg Tabs (Warfarin sodium) .Marland Kitchen... Take as directed    Lisinopril 10 Mg Tabs (Lisinopril) .Marland Kitchen... Take 1  tablet by mouth daily    Spironolactone 25 Mg Tabs (Spironolactone) .Marland Kitchen... Take 1/2 tablet by mouth daily

## 2010-03-12 NOTE — Medication Information (Signed)
Summary: ccr-lr  Anticoagulant Therapy  Managed by: Vashti Hey, RN Referring MD: Donnella Bi PCP: Billie Lade MD: Andee Lineman MD, Michelle Piper Indication 1: CVA--stroke (ICD-436) Indication 2: Cerebral Artery Thrombosis (ICD-434.0) [facility]  Site: [facility] INR POC 3.1  Dietary changes: no    Health status changes: no    Bleeding/hemorrhagic complications: no    Recent/future hospitalizations: no    Any changes in medication regimen? no    Recent/future dental: no  Any missed doses?: no       Is patient compliant with meds? yes       Allergies: No Known Drug Allergies  Anticoagulation Management History:      The patient is taking warfarin and comes in today for a routine follow up visit.  Positive risk factors for bleeding include history of CVA/TIA.  Negative risk factors for bleeding include an age less than 80 years old.  The bleeding index is 'intermediate risk'.  Positive CHADS2 values include History of CHF and Prior Stroke/CVA/TIA.  Negative CHADS2 values include Age > 20 years old.  The start date was 03/10/2008.  Her last INR was 2.4 ratio.  Anticoagulation responsible provider: Andee Lineman MD, Michelle Piper.  INR POC: 3.1.  Cuvette Lot#: [id number].  Exp: 10/11.    Anticoagulation Management Assessment/Plan:      The patient's current anticoagulation dose is Warfarin sodium 5 mg tabs: take as directed.  The target INR is 2 - 3.  The next INR is due 12/25/2009.  Anticoagulation instructions were given to patient.  Results were reviewed/authorized by Vashti Hey, RN.  She was notified by Vashti Hey RN.         Prior Anticoagulation Instructions: INR 2.0 Take coumadin 7.5mg  tonight then resume 5mg  once daily except 2.5mg  on Mondays and Fridays  Current Anticoagulation Instructions: INR 3.1 Continue coumadin 5mg  once daily except 2.5mg  on Mondays and Fridays

## 2010-03-12 NOTE — Letter (Signed)
Summary: Remote Device Check  [facility], Main Office  [address]   [place], [place] [address]   Phone: [phone]  Fax: [phone]     September 28, 2009 MRN: [id number]   [name] [name] [address] [place], [place]  [address]   Dear Ms. WILCOXSON,   Your remote transmission was recieved and reviewed by your physician.  All diagnostics were within normal limits for you.  __X___Your next transmission is scheduled for:  12-13-2009.  Please transmit at any time this day.  If you have a wireless device your transmission will be sent automatically.   Sincerely,  Vella Kohler

## 2010-03-12 NOTE — Cardiovascular Report (Signed)
Summary: Office Visit Remote   Office Visit   Imported By: Roderic Ovens 03/07/2009 12:17:53  _____________________________________________________________________  External Attachment:    Type:   Image     Comment:   External Document

## 2010-03-12 NOTE — Cardiovascular Report (Signed)
Summary: Office Visit Remote   Office Visit Remote   Imported By: Roderic Ovens 12/24/2009 15:12:17  _____________________________________________________________________  External Attachment:    Type:   Image     Comment:   External Document

## 2010-03-12 NOTE — Letter (Signed)
Summary: External Correspondence/ NOTE DR. DANIEL  External Correspondence/ NOTE DR. DANIEL   Imported By: Dorise Hiss 02/22/2009 08:28:18  _____________________________________________________________________  External Attachment:    Type:   Image     Comment:   External Document

## 2010-03-14 ENCOUNTER — Encounter: Payer: Self-pay | Admitting: Cardiology

## 2010-03-14 ENCOUNTER — Encounter: Payer: Self-pay | Admitting: Internal Medicine

## 2010-03-14 ENCOUNTER — Ambulatory Visit: Admit: 2010-03-14 | Payer: Self-pay | Admitting: Internal Medicine

## 2010-03-14 DIAGNOSIS — I059 Rheumatic mitral valve disease, unspecified: Secondary | ICD-10-CM

## 2010-03-14 NOTE — Medication Information (Signed)
Summary: ccr-lr  Anticoagulant Therapy  Managed by: Vashti Hey, RN Referring MD: Donnella Bi PCP: Billie Lade MD: Andee Lineman MD, Michelle Piper Indication 1: CVA--stroke (ICD-436) Indication 2: Cerebral Artery Thrombosis (ICD-434.0) Lab [facility] [place] Site: [facility] INR POC 1.8  Dietary changes: no    Health status changes: no    Bleeding/hemorrhagic complications: no    Recent/future hospitalizations: no    Any changes in medication regimen? no    Recent/future dental: no  Any missed doses?: yes     Details: Missed 1/2 dose  Is patient compliant with meds? yes       Allergies: No Known Drug Allergies  Anticoagulation Management History:      The patient is taking warfarin and comes in today for a routine follow up visit.  Positive risk factors for bleeding include history of CVA/TIA.  Negative risk factors for bleeding include an age less than 48 years old.  The bleeding index is 'intermediate risk'.  Positive CHADS2 values include History of CHF and Prior Stroke/CVA/TIA.  Negative CHADS2 values include Age > 59 years old.  The start date was 03/10/2008.  Her last INR was 2.4 ratio.  Anticoagulation responsible provider: Andee Lineman MD, Michelle Piper.  INR POC: 1.8.  Cuvette Lot#: [id number].  Exp: 10/11.    Anticoagulation Management Assessment/Plan:      The patient's current anticoagulation dose is Warfarin sodium 5 mg tabs: take as directed.  The target INR is 2 - 3.  The next INR is due 02/19/2010.  Anticoagulation instructions were given to patient.  Results were reviewed/authorized by Vashti Hey, RN.  She was notified by Vashti Hey RN.         Prior Anticoagulation Instructions: INR 3.2 Continue coumadin 5mg  once daily except 2.5mg  on Mondays and Fridays Increase greens  Current Anticoagulation Instructions: INR 1.8 Missed 1 dose Take coumadin 1 1/2 tablets tonight then resume 1 tablet once daily except 1/2 tablet on Mondays and Fridays

## 2010-03-14 NOTE — Letter (Signed)
Summary: External Correspondence/ OFFICE VISIT [facility]  External Correspondence/ OFFICE VISIT [facility]   Imported By: Dorise Hiss 02/26/2010 12:41:10  _____________________________________________________________________  External Attachment:    Type:   Image     Comment:   External Document

## 2010-03-14 NOTE — Assessment & Plan Note (Addendum)
Summary: 1 yr fu per jan reminder -srs   Visit Type:  Follow-up Referring Provider:  Dr Gala Romney Primary Provider:  Reuel Boom   History of Present Illness: the patient is a 60 year old female with a history of nonischemic cardiomyopathy ejection fraction 20 to 25%.  The patient has a family history of dilated cardiomyopathy but in addition she also received in the 90s several cycles of Adriamycin.  Fourthly she remains in NYHA class one.  She has noticed that he sleep with some fluids however and at times takes extra Lasix.  She started taking 20 mg of Lasix daily.  The patient denies any shortness of breath.  She has no orthopnea PND.  She does have occasional brief chest pains which are very atypical.  Check Cardiolite stress study done in the past which was entirely within normal limits.  The patient never had a cardiac catheterization.  She has a low risk profile for significant coronary artery disease.  She reports no defibrillator discharges.  Preventive Screening-Counseling & Management  Alcohol-Tobacco     Smoking Status: never  Current Medications (verified): 1)  Adult Aspirin Ec Low Strength 81 Mg Tbec (Aspirin) .... Qd 2)  Toprol Xl 50 Mg Xr24h-Tab (Metoprolol Succinate) .Marland Kitchen.. 1 Tab Once Daily 3)  Furosemide 20 Mg Tabs (Furosemide) .Marland Kitchen.. 1 Tab Once Daily As Needed 4)  Lanoxin 0.125 Mg Tabs (Digoxin) .... Take 1 Tablet By Mouth Once A Day 5)  Warfarin Sodium 5 Mg Tabs (Warfarin Sodium) .... Take As Directed 6)  Lisinopril 10 Mg Tabs (Lisinopril) .... Take 1  Tablet By Mouth Daily 7)  Spironolactone 25 Mg Tabs (Spironolactone) .... Take 1/2 Tablet By Mouth Daily 8)  Macrodantin 50 Mg Caps (Nitrofurantoin Macrocrystal) .... Take 1 Tablet By Mouth Once A Day  Allergies (verified): No Known Drug Allergies  Comments:  Nurse/Medical Assistant: The patient's medication list and allergies were reviewed with the patient and were updated in the Medication and Allergy Lists.  Past  History:  Past Medical History: Last updated: 03/16/2008 Cancer (Breast) txt'd with Adriamycin 1990's Cerebrovascular Disease S/P Right MCA cardioemoblic infarct  03/09/08 Current Problems:  FATIGUE / MALAISE (ICD-780.79) LONG TERM USE OF ASPIRIN (ICD-V58.66) LONG TERM ANTIPLATELET THERAPY (ICD-V58.63) DYSPNEA (ICD-786.05) EDEMA (ICD-782.3) CONGESTIVE HEART FAILURE, UNSPECIFIED (ICD-428.0)  Family History: Last updated: 03/16/2008 Family History of Cardiomyopathy:  Brother S/P heart transplant  Family History of CVA or Stroke:   Social History: Last updated: 03/16/2008 Married lives in [place] w/husband Tobacco Use - No.  Alcohol Use - no Drug Use - no  Risk Factors: Smoking Status: never (03/08/2010)  Review of Systems       The patient complains of weight gain/loss.  The patient denies fatigue, malaise, fever, vision loss, decreased hearing, hoarseness, chest pain, palpitations, shortness of breath, prolonged cough, wheezing, sleep apnea, coughing up blood, abdominal pain, blood in stool, nausea, vomiting, diarrhea, heartburn, incontinence, blood in urine, muscle weakness, joint pain, leg swelling, rash, skin lesions, headache, fainting, dizziness, depression, anxiety, enlarged lymph nodes, easy bruising or bleeding, and environmental allergies.    Vital Signs:  Patient profile:   60 year old female Height:      68 inches Weight:      160 pounds Pulse rate:   83 / minute BP sitting:   99 / 64  (left arm) Cuff size:   regular  Vitals Entered By: Carlye Grippe (March 08, 2010 10:42 AM)  Physical Exam  Additional Exam:  General: Well-developed, well-nourished in no distress head: Normocephalic  and atraumatic eyes PERRLA/EOMI intact, conjunctiva and lids normal nose: No deformity or lesions mouth normal dentition, normal posterior pharynx neck: Supple, no JVD.  No masses, thyromegaly or abnormal cervical nodes. No carotid bruits lungs: Normal breath sounds  bilaterally without wheezing.  Normal percussion heart: regular rate and rhythm with normal S1 and S2, no S3 or S4.  PMI is normal.  No pathological murmurs abdomen: Normal bowel sounds, abdomen is soft and nontender without masses, organomegaly or hernias noted.  No hepatosplenomegaly musculoskeletal: Back normal, normal gait muscle strength and tone normal pulsus: Pulse is normal in all 4 extremities Extremities: trace peripheral pitting edema neurologic: Alert and oriented x 3 skin: Intact without lesions or rashes cervical nodes: No significant adenopathy psychologic: Normal affect    PPM Specifications Following MD:  Sherryl Manges, MD       Parameters Mode:  DDDR      ICD Specifications Following MD:  Sherryl Manges, MD     Referring MD:  [facility] ICD Vendor:  St Jude     ICD Model Number:  ZO1096-04     ICD Serial Number:  [id number] ICD DOI:  05/26/2008     ICD Implanting MD:  Sherryl Manges, MD  Lead 1:    Location: RA     DOI: 05/26/2008     Model #: [id number]     Serial #: [id number]     Status: active Lead 2:    Location: RV     DOI: 05/26/2008     Model #: [id number]     Serial #: [id number]     Status: active  Indications::  NICM   ICD Follow Up ICD Dependent:  No      Episodes Coumadin:  No  Brady Parameters Mode DDI     Lower Rate Limit:  60     PAV 300      Tachy Zones VF:  240     VT:  214     VT1:  181     Impression & Recommendations:  Problem # 1:  SYSTOLIC HEART FAILURE, CHRONIC (ICD-428.22) stable but with some easy increase in weight and volume status.  I asked the patient to increase her Lasix to 40 milligrams p.o. daily.  We will check an electrolyte panel in one week.  She was instructed not to take any potassium due to the fact that she is on Aldactone therapy.  The patient also needs follow-up echocardiogram to reassess her LV function and this will also be scheduled for next week. Her updated medication list for this problem includes:    Adult Aspirin Ec Low Strength  81 Mg Tbec (Aspirin) ..... Qd    Toprol Xl 50 Mg Xr24h-tab (Metoprolol succinate) .Marland Kitchen... 1 tab once daily    Furosemide 40 Mg Tabs (Furosemide) .Marland Kitchen... Take one tablet by mouth daily.    Lanoxin 0.125 Mg Tabs (Digoxin) .Marland Kitchen... Take 1 tablet by mouth once a day    Warfarin Sodium 5 Mg Tabs (Warfarin sodium) .Marland Kitchen... Take as directed    Lisinopril 10 Mg Tabs (Lisinopril) .Marland Kitchen... Take 1  tablet by mouth daily    Spironolactone 25 Mg Tabs (Spironolactone) .Marland Kitchen... Take 1/2 tablet by mouth daily  Orders: EKG w/ Interpretation (93000) T-Basic Metabolic Panel (84132-44010) T-BNP  (B Natriuretic Peptide) (27253-66440) 2-D Echocardiogram (2D Echo)  Problem # 2:  HYPOTENSION, CHRONIC (ICD-458.1) blood pressure stable and is asymptomatic.  No further changes in medical therapy required.  Problem # 3:  IMPLANTATION OF DEFIBRILLATOR, HX  OF (ICD-V45.02) patient is followed by EP service.  She has had no discharges. Orders: EKG w/ Interpretation (93000)  Patient Instructions: 1)  Your physician wants you to follow-up in: 6 months. You will receive a reminder letter in the mail one-two months in advance. If you don't receive a letter, please call our office to schedule the follow-up appointment. 2)  Increase Lasix (furosemide) to 40mg  by mouth once daily. 3)  Your physician recommends that you go to the [facility] for lab work: DO LABS NEXT WEEK. 4)  Your physician has requested that you have an echocardiogram.  Echocardiography is a painless test that uses sound waves to create images of your heart. It provides your doctor with information about the size and shape of your heart and how well your heart's chambers and valves are working.  This procedure takes approximately one hour. There are no restrictions for this procedure. Prescriptions: FUROSEMIDE 40 MG TABS (FUROSEMIDE) Take one tablet by mouth daily.  #30 x 6   Entered by:   Cyril Loosen, RN, BSN   Authorized by:   Lewayne Bunting, MD, [facility]   Signed by:    Cyril Loosen, RN, BSN on 03/08/2010   Method used:   Electronically to        [facility] [address] (retail)       [address]       [place], [place]  [address]       Ph: [phone] or [phone]       Fax: [phone]   RxID:   [id number]

## 2010-03-14 NOTE — Medication Information (Signed)
Summary: ccr-lr  Anticoagulant Therapy  Managed by: Vashti Hey, RN Referring MD: Donnella Bi PCP: Billie Lade MD: Andee Lineman MD, Michelle Piper Indication 1: CVA--stroke (ICD-436) Indication 2: Cerebral Artery Thrombosis (ICD-434.0) Lab [facility] [place] Site: [place] INR POC 3.4  Dietary changes: yes       Details: ate less Vit K foods this month  Health status changes: no    Bleeding/hemorrhagic complications: no    Recent/future hospitalizations: no    Any changes in medication regimen? no    Recent/future dental: no  Any missed doses?: no       Is patient compliant with meds? yes       Allergies: No Known Drug Allergies  Anticoagulation Management History:      The patient is taking warfarin and comes in today for a routine follow up visit.  Positive risk factors for bleeding include history of CVA/TIA.  Negative risk factors for bleeding include an age less than 69 years old.  The bleeding index is 'intermediate risk'.  Positive CHADS2 values include History of CHF and Prior Stroke/CVA/TIA.  Negative CHADS2 values include Age > 59 years old.  The start date was 03/10/2008.  Her last INR was 2.4 ratio.  Anticoagulation responsible provider: Andee Lineman MD, Michelle Piper.  INR POC: 3.4.  Cuvette Lot#: [id number].  Exp: 10/11.    Anticoagulation Management Assessment/Plan:      The patient's current anticoagulation dose is Warfarin sodium 5 mg tabs: take as directed.  The target INR is 2 - 3.  The next INR is due 03/19/2010.  Anticoagulation instructions were given to patient.  Results were reviewed/authorized by Vashti Hey, RN.  She was notified by Vashti Hey RN.         Prior Anticoagulation Instructions: INR 1.8 Missed 1 dose Take coumadin 1 1/2 tablets tonight then resume 1 tablet once daily except 1/2 tablet on Mondays and Fridays  Current Anticoagulation Instructions: INR 3.4 Take coumadin 1/2 tablet tonight then resume 1 tablet once daily except 1/2 tablets on Mondays and  Fridays Increase greens/salads

## 2010-03-18 ENCOUNTER — Encounter (INDEPENDENT_AMBULATORY_CARE_PROVIDER_SITE_OTHER): Payer: Self-pay | Admitting: *Deleted

## 2010-03-19 ENCOUNTER — Encounter (INDEPENDENT_AMBULATORY_CARE_PROVIDER_SITE_OTHER): Payer: BC Managed Care – PPO

## 2010-03-19 ENCOUNTER — Encounter: Payer: Self-pay | Admitting: Cardiology

## 2010-03-19 DIAGNOSIS — Z7901 Long term (current) use of anticoagulants: Secondary | ICD-10-CM

## 2010-03-19 DIAGNOSIS — I6789 Other cerebrovascular disease: Secondary | ICD-10-CM

## 2010-03-24 ENCOUNTER — Encounter: Payer: Self-pay | Admitting: Internal Medicine

## 2010-03-25 ENCOUNTER — Encounter (INDEPENDENT_AMBULATORY_CARE_PROVIDER_SITE_OTHER): Payer: BC Managed Care – PPO

## 2010-03-25 DIAGNOSIS — I428 Other cardiomyopathies: Secondary | ICD-10-CM

## 2010-03-28 NOTE — Letter (Signed)
Summary: Engineer, materials at [facility]  [address]   [place], [place] [address]   Phone: [phone]  Fax: [phone]        March 18, 2010 MRN: [id number]   [name] [name] [address] [place], [place]  [address]   Dear Ms. Orvan Falconer,  Your test ordered by Selena Batten has been reviewed by your physician (or physician assistant) and was found to be normal or stable. Your physician (or physician assistant) felt no changes were needed at this time.  ____ Echocardiogram  ____ Cardiac Stress Test  __X__ Lab Work  ____ Peripheral vascular study of arms, legs or neck  ____ CT scan or X-ray  ____ Lung or Breathing test  ____ Other:   Thank you.   Hoover Brunette, LPN    Duane Boston, M.D., F.A.C.C. Thressa Sheller, M.D., F.A.C.C. Oneal Grout, M.D., F.A.C.C. Cheree Ditto, M.D., F.A.C.C. Daiva Nakayama, M.D., F.A.C.C. Kenney Houseman, M.D., F.A.C.C. Jeanne Ivan, PA-C

## 2010-03-28 NOTE — Letter (Signed)
Summary: [employer] Phone Journalist, newspaper, Main Office  [address]   [place], [place] [address]   Phone: [phone]  Fax: [phone]     March 18, 2010 MRN: [id number]   NAELLE DIEGEL [address] [place], [place]  [address]   Dear Ms. GRANDBERRY,  According to our records, you were scheduled for a device phone transmission on  03-14-2010.     We did not receive any results from this check.  If you transmitted on your scheduled day, please call us to help troubleshoot your system.  If you forgot to send your transmission, please send one upon receipt of this letter.  Thank you,   Architectural technologist [facility]

## 2010-03-28 NOTE — Medication Information (Signed)
Summary: ccr-lr  Lab Visit  Orders Today:  Anticoagulant Therapy  Managed by: Vashti Hey, RN Referring MD: Donnella Bi PCP: Billie Lade MD: Diona Browner MD, Remi Deter Indication 1: CVA--stroke (ICD-436) Indication 2: Cerebral Artery Thrombosis (ICD-434.0) Lab [facility]: [facility] [place] Site: [place] INR POC 2.8  Dietary changes: no    Health status changes: no    Bleeding/hemorrhagic complications: no    Recent/future hospitalizations: no    Any changes in medication regimen? no    Recent/future dental: no  Any missed doses?: no       Is patient compliant with meds? yes         Anticoagulation Management History:      The patient is taking warfarin and comes in today for a routine follow up visit.  Positive risk factors for bleeding include history of CVA/TIA.  Negative risk factors for bleeding include an age less than 59 years old.  The bleeding index is 'intermediate risk'.  Positive CHADS2 values include History of CHF and Prior Stroke/CVA/TIA.  Negative CHADS2 values include Age > 59 years old.  The start date was 03/10/2008.  Her last INR was 2.4 ratio.  Anticoagulation responsible provider: Diona Browner MD, Remi Deter.  INR POC: 2.8.  Cuvette Lot#: [id number].  Exp: 10/11.    Anticoagulation Management Assessment/Plan:      The patient's current anticoagulation dose is Warfarin sodium 5 mg tabs: take as directed.  The target INR is 2 - 3.  The next INR is due 04/16/2010.  Anticoagulation instructions were given to patient.  Results were reviewed/authorized by Vashti Hey, RN.  She was notified by Vashti Hey RN.         Prior Anticoagulation Instructions: INR 3.4 Take coumadin 1/2 tablet tonight then resume 1 tablet once daily except 1/2 tablets on Mondays and Fridays Increase greens/salads  Current Anticoagulation Instructions: INR 2.8 Continue coumadin 5mg  once daily except 2.5mg  on Mondays and Fridays

## 2010-04-01 ENCOUNTER — Encounter (INDEPENDENT_AMBULATORY_CARE_PROVIDER_SITE_OTHER): Payer: Self-pay | Admitting: *Deleted

## 2010-04-09 NOTE — Cardiovascular Report (Signed)
Summary: Office Visit Remote   Office Visit Remote   Imported By: Roderic Ovens 04/05/2010 11:14:17  _____________________________________________________________________  External Attachment:    Type:   Image     Comment:   External Document

## 2010-04-09 NOTE — Letter (Signed)
Summary: Remote Device Check  [employer], Main Office  [address]   [place], [place] [address]   Phone: [phone]  Fax: [phone]     April 01, 2010 MRN: [id number]   [name] [name] [address] [place], [place]  [address]   Dear [name],   Your remote transmission was recieved and reviewed by your physician.  All diagnostics were within normal limits for you.  __X____Your next office visit is scheduled for: May 2012 with Dr Graciela Husbands. Please call our office to schedule an appointment.    Sincerely,  Vella Kohler

## 2010-04-16 ENCOUNTER — Encounter: Payer: Self-pay | Admitting: Cardiology

## 2010-04-16 ENCOUNTER — Encounter (INDEPENDENT_AMBULATORY_CARE_PROVIDER_SITE_OTHER): Payer: BC Managed Care – PPO

## 2010-04-16 DIAGNOSIS — Z7901 Long term (current) use of anticoagulants: Secondary | ICD-10-CM

## 2010-04-16 DIAGNOSIS — I6789 Other cerebrovascular disease: Secondary | ICD-10-CM

## 2010-04-23 NOTE — Medication Information (Signed)
Summary: [facility]  Anticoagulant Therapy  Managed by: Vashti Hey, RN Referring MD: Donnella Bi PCP: Billie Lade MD: Myrtis Ser MD, Tinnie Gens Indication 1: CVA--stroke (ICD-436) Indication 2: Cerebral Artery Thrombosis (ICD-434.0) Lab Used: [facility] [place] Site: [place] INR POC 3.3  Dietary changes: no    Health status changes: no    Bleeding/hemorrhagic complications: no    Recent/future hospitalizations: no    Any changes in medication regimen? no    Recent/future dental: no  Any missed doses?: no       Is patient compliant with meds? yes       Allergies: No Known Drug Allergies  Anticoagulation Management History:      The patient is taking warfarin and comes in today for a routine follow up visit.  Positive risk factors for bleeding include history of CVA/TIA.  Negative risk factors for bleeding include an age less than 59 years old.  The bleeding index is 'intermediate risk'.  Positive CHADS2 values include History of CHF and Prior Stroke/CVA/TIA.  Negative CHADS2 values include Age > 59 years old.  The start date was 03/10/2008.  Her last INR was 2.4 ratio.  Anticoagulation responsible provider: Myrtis Ser MD, Tinnie Gens.  INR POC: 3.3.  Cuvette Lot#: [id number].  Exp: 10/11.    Anticoagulation Management Assessment/Plan:      The patient's current anticoagulation dose is Warfarin sodium 5 mg tabs: take as directed.  The target INR is 2 - 3.  The next INR is due 05/14/2010.  Anticoagulation instructions were given to patient.  Results were reviewed/authorized by Vashti Hey, RN.  She was notified by Vashti Hey RN.         Prior Anticoagulation Instructions: INR 2.8 Continue coumadin 5mg  once daily except 2.5mg  on Mondays and Fridays  Current Anticoagulation Instructions: INR 3.3 Hold coumadin tonight then resume 5mg  once daily except 2.5mg  on Mondays and Fridays Increase greens

## 2010-04-29 ENCOUNTER — Encounter: Payer: Self-pay | Admitting: *Deleted

## 2010-05-06 ENCOUNTER — Encounter: Payer: Self-pay | Admitting: Cardiology

## 2010-05-06 DIAGNOSIS — I6350 Cerebral infarction due to unspecified occlusion or stenosis of unspecified cerebral artery: Secondary | ICD-10-CM

## 2010-05-06 DIAGNOSIS — Z7901 Long term (current) use of anticoagulants: Secondary | ICD-10-CM | POA: Insufficient documentation

## 2010-05-09 NOTE — Letter (Signed)
Summary: Remote Device Check  [employer], Main Office  [address]   [place], [place] [address]   Phone: [phone]  Fax: [phone]     April 29, 2010 MRN: [id number]   [name] [name] [address] [place], [place]  [address]   Dear [name],   Your remote transmission was recieved and reviewed by your physician.  All diagnostics were within normal limits for you.  __X____Your next office visit is scheduled for: May 2012 with Dr Graciela Husbands. Please call our office to schedule an appointment.    Sincerely,  Vella Kohler

## 2010-05-09 NOTE — Cardiovascular Report (Signed)
Summary: Office Visit   Office Visit   Imported By: Roderic Ovens 05/01/2010 09:43:01  _____________________________________________________________________  External Attachment:    Type:   Image     Comment:   External Document

## 2010-05-14 ENCOUNTER — Other Ambulatory Visit: Payer: Self-pay | Admitting: *Deleted

## 2010-05-14 ENCOUNTER — Ambulatory Visit (INDEPENDENT_AMBULATORY_CARE_PROVIDER_SITE_OTHER): Payer: BC Managed Care – PPO | Admitting: *Deleted

## 2010-05-14 DIAGNOSIS — I6350 Cerebral infarction due to unspecified occlusion or stenosis of unspecified cerebral artery: Secondary | ICD-10-CM

## 2010-05-14 DIAGNOSIS — Z7901 Long term (current) use of anticoagulants: Secondary | ICD-10-CM

## 2010-05-14 MED ORDER — FUROSEMIDE 40 MG PO TABS: 40.0000 mg | ORAL_TABLET | Freq: Every day | ORAL | Status: DC

## 2010-05-16 ENCOUNTER — Telehealth: Payer: Self-pay | Admitting: *Deleted

## 2010-05-22 LAB — PROTIME-INR
INR: 2.4 — ABNORMAL HIGH (ref 0.00–1.49)
Prothrombin Time: 27.5 seconds — ABNORMAL HIGH (ref 11.6–15.2)

## 2010-05-23 NOTE — Telephone Encounter (Signed)
Left message for patient to call office.  

## 2010-05-23 NOTE — Telephone Encounter (Signed)
Spoke with patient and informed her that [employer] needed additonal information. Patient say's that she is supposed to be using a different pharmacy now and she would get back with our office with the name we need to send the furosemide rx to .

## 2010-05-27 LAB — BLOOD GAS, ARTERIAL
Acid-base deficit: 0.3 mmol/L (ref 0.0–2.0)
Bicarbonate: 22.9 mEq/L (ref 20.0–24.0)
FIO2: 0.21 %
O2 Saturation: 95.5 %
Patient temperature: 98.6
TCO2: 23.8 mmol/L (ref 0–100)
pCO2 arterial: 31.1 mmHg — ABNORMAL LOW (ref 35.0–45.0)
pH, Arterial: 7.48 — ABNORMAL HIGH (ref 7.350–7.400)
pO2, Arterial: 74.2 mmHg — ABNORMAL LOW (ref 80.0–100.0)

## 2010-05-27 LAB — COMPREHENSIVE METABOLIC PANEL
ALT: 64 U/L — ABNORMAL HIGH (ref 0–35)
ALT: 74 U/L — ABNORMAL HIGH (ref 0–35)
Alkaline Phosphatase: 45 U/L (ref 39–117)
Alkaline Phosphatase: 49 U/L (ref 39–117)
CO2: 24 mEq/L (ref 19–32)
GFR calc non Af Amer: >60 mL/min (ref >60)
Glucose, Bld: 108 mg/dL — ABNORMAL HIGH (ref 70–99)
Glucose, Bld: 111 mg/dL — ABNORMAL HIGH (ref 70–99)
Potassium: 3.6 mEq/L (ref 3.5–5.1)
Potassium: 3.7 mEq/L (ref 3.5–5.1)
Sodium: 137 mEq/L (ref 135–145)
Sodium: 141 mEq/L (ref 135–145)
Total Bilirubin: 0.8 mg/dL (ref 0.3–1.2)
Total Protein: 5.7 g/dL — ABNORMAL LOW (ref 6.0–8.3)

## 2010-05-27 LAB — BASIC METABOLIC PANEL
BUN: 12 mg/dL (ref 6–23)
CO2: 27 mEq/L (ref 19–32)
Calcium: 9 mg/dL (ref 8.4–10.5)
Creatinine, Ser: 0.89 mg/dL (ref 0.4–1.2)
GFR calc non Af Amer: >60 mL/min (ref >60)
Glucose, Bld: 100 mg/dL — ABNORMAL HIGH (ref 70–99)
Potassium: 3.4 mEq/L — ABNORMAL LOW (ref 3.5–5.1)
Potassium: 4 mEq/L (ref 3.5–5.1)
Sodium: 142 mEq/L (ref 135–145)

## 2010-05-27 LAB — HEPARIN LEVEL (UNFRACTIONATED)
Heparin Unfractionated: 0.15 IU/mL — ABNORMAL LOW (ref 0.30–0.70)
Heparin Unfractionated: 0.61 IU/mL (ref 0.30–0.70)

## 2010-05-27 LAB — URINALYSIS, ROUTINE W REFLEX MICROSCOPIC
Bilirubin Urine: NEGATIVE
Glucose, UA: NEGATIVE mg/dL
Leukocytes, UA: NEGATIVE
Nitrite: NEGATIVE
Specific Gravity, Urine: 1.026 (ref 1.005–1.030)
Specific Gravity, Urine: 1.027 (ref 1.005–1.030)
Urobilinogen, UA: 0.2 mg/dL (ref 0.0–1.0)
Urobilinogen, UA: 1 mg/dL (ref 0.0–1.0)

## 2010-05-27 LAB — CBC
HCT: 39.2 % (ref 36.0–46.0)
HCT: 39.5 % (ref 36.0–46.0)
HCT: 39.9 % (ref 36.0–46.0)
Hemoglobin: 13.1 g/dL (ref 12.0–15.0)
Hemoglobin: 13.2 g/dL (ref 12.0–15.0)
Hemoglobin: 13.2 g/dL (ref 12.0–15.0)
MCHC: 33.3 g/dL (ref 30.0–36.0)
MCV: 86.1 fL (ref 78.0–100.0)
Platelets: 176 10*3/uL (ref 150–400)
RBC: 4.5 MIL/uL (ref 3.87–5.11)
RBC: 4.55 MIL/uL (ref 3.87–5.11)
RBC: 4.56 MIL/uL (ref 3.87–5.11)
RBC: 4.56 MIL/uL (ref 3.87–5.11)
RDW: 15 % (ref 11.5–15.5)
WBC: 10.7 10*3/uL — ABNORMAL HIGH (ref 4.0–10.5)
WBC: 5.9 10*3/uL (ref 4.0–10.5)

## 2010-05-27 LAB — CARDIAC PANEL(CRET KIN+CKTOT+MB+TROPI)
CK, MB: 3 ng/mL (ref 0.3–4.0)
Total CK: 90 U/L (ref 7–177)
Troponin I: 0.1 ng/mL — ABNORMAL HIGH (ref 0.00–0.06)

## 2010-05-27 LAB — URINE MICROSCOPIC-ADD ON

## 2010-05-27 LAB — PREGNANCY, URINE: Preg Test, Ur: NEGATIVE

## 2010-05-27 LAB — TROPONIN I: Troponin I: 0.12 ng/mL — ABNORMAL HIGH (ref 0.00–0.06)

## 2010-05-27 LAB — URINE CULTURE

## 2010-05-27 LAB — PROTIME-INR: Prothrombin Time: 19 seconds — ABNORMAL HIGH (ref 11.6–15.2)

## 2010-05-27 LAB — LIPID PANEL
Cholesterol: 128 mg/dL (ref 0–200)
HDL: 33 mg/dL — ABNORMAL LOW (ref >39)
LDL Cholesterol: 82 mg/dL (ref 0–99)
Total CHOL/HDL Ratio: 3.9 RATIO

## 2010-05-27 LAB — HOMOCYSTEINE: Homocysteine: 4.8 umol/L (ref 4.0–15.4)

## 2010-05-27 LAB — HEMOGLOBIN A1C: Hgb A1c MFr Bld: 6 % (ref 4.6–6.1)

## 2010-05-27 LAB — CK TOTAL AND CKMB (NOT AT ARMC)
CK, MB: 2.5 ng/mL (ref 0.3–4.0)
Total CK: 67 U/L (ref 7–177)

## 2010-05-27 LAB — BRAIN NATRIURETIC PEPTIDE: Pro B Natriuretic peptide (BNP): 1161 pg/mL — ABNORMAL HIGH (ref 0.0–100.0)

## 2010-05-27 LAB — SODIUM, URINE, RANDOM: Sodium, Ur: 107 mEq/L

## 2010-05-28 LAB — BRAIN NATRIURETIC PEPTIDE: Pro B Natriuretic peptide (BNP): 713 pg/mL — ABNORMAL HIGH (ref 0.0–100.0)

## 2010-05-28 LAB — CBC
Platelets: 176 10*3/uL (ref 150–400)
RDW: 14.9 % (ref 11.5–15.5)

## 2010-05-28 LAB — COMPREHENSIVE METABOLIC PANEL
ALT: 35 U/L (ref 0–35)
Albumin: 3.2 g/dL — ABNORMAL LOW (ref 3.5–5.2)
Alkaline Phosphatase: 42 U/L (ref 39–117)
Calcium: 9.2 mg/dL (ref 8.4–10.5)
GFR calc Af Amer: >60 mL/min (ref >60)
Potassium: 4 mEq/L (ref 3.5–5.1)
Sodium: 140 mEq/L (ref 135–145)
Total Protein: 5.9 g/dL — ABNORMAL LOW (ref 6.0–8.3)

## 2010-06-11 ENCOUNTER — Ambulatory Visit (INDEPENDENT_AMBULATORY_CARE_PROVIDER_SITE_OTHER): Payer: BC Managed Care – PPO | Admitting: *Deleted

## 2010-06-11 DIAGNOSIS — Z7901 Long term (current) use of anticoagulants: Secondary | ICD-10-CM

## 2010-06-11 DIAGNOSIS — I6350 Cerebral infarction due to unspecified occlusion or stenosis of unspecified cerebral artery: Secondary | ICD-10-CM

## 2010-06-11 LAB — POCT INR: INR: 3.1

## 2010-06-25 NOTE — Op Note (Signed)
NAMECLAUDETTE, WERMUTH NO.:  [id number]   MEDICAL RECORD NO.:  [id number]          PATIENT TYPE:  INP   LOCATION:  3735                         FACILITY:  [facility]   PHYSICIAN:  Duke Salvia, MD, FACCDATE OF BIRTH:  August 02, 1950   DATE OF PROCEDURE:  05/26/2008  DATE OF DISCHARGE:                               OPERATIVE REPORT   PREOPERATIVE DIAGNOSIS:  Nonischemic cardiomyopathy, probably genetic,  possibly chemotherapy induced.   POSTOPERATIVE DIAGNOSIS:  Nonischemic cardiomyopathy, probably genetic,  possibly chemotherapy induced; bradycardia.   PROCEDURE:  Dual-chamber defibrillator implantation with intraoperative  defibrillation threshold testing.   PROCEDURE IN DETAIL:  Following obtaining informed consent, the patient  was brought to the Electrophysiology Laboratory and placed on the  fluoroscopic table in supine position.  After routine prep and drape of  the left upper chest, lidocaine was infiltrated in the prepectoral  subclavicular region.  An incision was made and carried down to layer of  the prepectoral fascia using electrocautery and sharp dissection.  A  pocket was formed similarly.  Hemostasis was obtained.   Thereafter, attention was turned to gain access to the extrathoracic  left subclavian vein which was accomplished without difficulty without  the aspiration or puncture of the artery.  Two separate venipunctures  were accomplished.  Guidewires were placed and retained and 0-silk  suture was placed in figure-of-eight fashion and allowed to hang  loosely.   Subsequently, an 8-French and then 7-French sheaths were placed through  which were passed sequentially a Durata 7122 65-cm active fixation  single coil defibrillator lead, serial #[id number] and a St. Jude [id number]  active fixation atrial lead, serial #[id number].  Under fluoroscopic  guidance, these were manipulated in the right ventricular apex and the  right atrial appendage respectively  where the bipolar R-wave was 11.3  with a pace impedance of 672 ohms, a threshold of 0.9 volts at 0.5  milliseconds, and current of threshold was 1.6 mA.  There was no  diaphragmatic pacing at 10 volts and the current of injury was modest.  The bipolar P-wave was 6.4 with the pace impedance of 578 ohms, a  threshold of 0.9 volts at 0.5 milliseconds, and current of threshold was  0.8 mA.  There was no diaphragmatic pacing at 10 volts and the current  of injury was brisk.  These leads were secured to the prepectoral fascia  and then attached to a St. Jude Current CD 2211 ICD model 4, serial  [id number].  Through the device, the bipolar P-wave was 5 with a pace  impedance of 540, threshold 0.5 volts at 0.5 milliseconds, the R-wave  was 11.8 with a pace impedance of 580, threshold 0.5 at 0.5, and high-  voltage impedance was 57 ohms.   With these acceptable parameters recorded, defibrillation threshold  testing was undertaken.  Ventricular fibrillation was induced via the T-  wave shock.  After a total duration of 6.5 seconds, a 15-joule shock was  delivered through a measured resistance of 61 ohms terminating  ventricular fibrillation and restoring sinus rhythm.  The device was  implanted.  The pocket was copiously irrigated  with antibiotic  containing saline solution.  Hemostasis was assured.  Leads in the pulse  generator were placed in the pocket and secured to the prepectoral  fascia.  The wound was closed  in 3 layers in normal fashion.  The wound was washed, dried, and a  benzoin and Steri-Strip dressing was applied.  Needle counts, sponge  counts, and instrument counts were correct at the end of the procedure  according to the staff.  The patient tolerated the procedure without  apparent complication.      Duke Salvia, MD, [facility]  Electronically Signed     Duke Salvia, MD, [facility]  Electronically Signed    SCK/MEDQ  D:  05/26/2008  T:  05/26/2008  Job:  [id number]   cc:    Learta Codding, MD,FACC  Carlena Bjornstad, M.D.

## 2010-06-25 NOTE — Assessment & Plan Note (Signed)
[facility]                          [facility]   [name] [name], [name] [name]                    MRN:          [id number]  DATE:04/18/2008                            DOB:          [birth year]    PRIMARY CARE PHYSICIAN:  Dr. Lia Hopping.   REASON FOR OFFICE VISIT:  At request of Dr. Olena Leatherwood to see the patient  after recent heart failure exacerbation and stroke at the [facility].  Also, questions regarding known use of ACE inhibitor  currently.   HISTORY OF PRESENT ILLNESS:  The patient is a very pleasant 60 year old  female with a family history of cardiomyopathy and a prior personal  history of cardiomyopathy.  The patient has a history of breast cancer  and experienced cardiotoxicity secondary to anthracycline therapy.  Reportedly, she received her chemotherapy in 1994.  Previous ejection  fractions, however, were not known and the patient presented with a  first heart failure exacerbation in January 2010.  During  hospitalization, ejection fraction was 15%, and based on the MRI, it  appeared that the patient had a cardioembolic event explaining her  neurological symptoms.  A followup ejection fraction demonstrate an EF  of 20-25% and the patient has been seen in followup in Heart Failure  Clinic at [facility].  Currently, the patient is an NYHA class IIB.  The  patient was on a medical regimen with Toprol, furosemide, and Lanoxin,  but there was no evidence that she was placed on an ACE inhibitor and  this was a question that Dr. Olena Leatherwood raised.  Also because of poor prior  documentation of her ejection fractions, I assume that no mention was  made of the need for ICD, although given her family history and her near  optimal therapy for heart failure therapy, and recent class IV heart  failure symptoms, the patient is a candidate for ICD implant.  She does  have narrow QRS by EKG and normal PR interval.  During this office  visit, I also spent considerable time with the patient to put data into  the [facility] Heart Failure Model.  The patient has been very frightful  and was worried that her prognosis was extremely poor.  At the right to  demonstrate that when she is on optimal therapy including an ACE  inhibitor and potentially even an ARB in addition together with ICD, her  statistical long-term survival is at least 16-17 years possibly longer  given her lack of major comorbidities.  The patient was very pleased  with me showing her these datas in a computer model fashion.   Currently, the patient states that she is doing well.  She does have  some neck pain secondary to herniated disk history.  She has no chest  pain, orthopnea, or PND.  Interestingly, it does not appear that the  patient never had an ischemia workup done previously.   As mentioned in the note from Dorian Pod that the patient was not  on ACE inhibitor due to hypotension.  Certainly, the patient today in  the office, however, blood pressure is quite good  at 118/75 and she  should be able to tolerate an ACE inhibitor.  We will have to follow up  her blood pressures carefully.   PAST MEDICAL HISTORY:  1. Breast cancer with anthracycline therapy in the 1990s.  2. Cerebrovascular disease status post right MCA cardioembolic infarct      March 09, 2008, also with mention made of a 3 x 2-mm aneurysm in      internal carotid artery with hypophysis.  3. Dyspnea, NYHA class IIB.  4. Status post acute systolic heart failure.  5. Family history of cardiomyopathy with a brother who had a heart      transplant.  6. Family history of CVA.   SOCIAL HISTORY:  The patient is married and lives in with her husband.  She does not smoke or drink.   MEDICATIONS:  1. Aspirin 81 mg p.o. daily.  2. Digoxin 0.125 mg p.o. daily.  3. Toprol-XL 25 mg p.o. daily.  4. Lasix 40 mg p.o. daily.  5. Potassium 20 mEq p.o. daily.  6. Coumadin 5 mg as  directed.  7. Prednisone taper.  8. Macrodantin 50 mg p.o. daily.   REVIEW OF SYSTEMS:  Of note is also that the patient has had frequent  UTIs and in her review of system today mention that she has had  significant hematuria as a matter of fact that they had her interview  when her INR was on the low side.  She had bright red blood from the  bladder.   PHYSICAL EXAMINATION:  VITAL SIGNS:  Blood pressure is 118/75, heart  rate 66, weight 161 pounds.  GENERAL:  Well-developed, well-nourished female in no acute distress.  HEENT:  Pupils anisocoric.  Conjunctivae are clear.  NECK:  Supple.  Normal carotid upstrokes and no carotid bruits.  LUNGS:  Clear breath sounds.  HEART:  Regular rate and rhythm.  Normal S1 and s2.  No murmurs, rubs,  or gallops.  ABDOMEN:  Soft, nontender with no rebound or guarding and good bowel  sounds.  EXTREMITIES:  No cyanosis, clubbing, or edema.  NEURO:  Patient alert, oriented, and grossly nonfocal.   PROBLEM LIST:  1. Status post acute-on-chronic systolic heart failure.  2. Ejection fraction 20-25%.  3. Probable nonischemic cardiomyopathy.  4. History of cardiomyopathy and a brother with heart transplant.  5. Question on use of ACE inhibitor due to prior hypotension.  6. History of breast cancer with chemotherapy with anthracycline      therapy and told that this cost cardiotoxicity.  7. Significant hematuria with an INR of 1.7.  8. Coumadin therapy secondary to recent cardioembolic events to the      right middle cerebral artery.  9. 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  10.Carotid hypophysis.   PLAN:  1. Interestingly in the discharge summary it was not mentioned that      there were any abnormalities on the MRI.  In addition, there is      also a small aneurysm and I will refer the patient back to      Neurology to address this as this may have been an oversight.  2. The patient has significant hematuria and she has a history of       chronic UTIs, but she will need a referral to urologist as she      certainly need a repeat cystoscopy  3. From a heart failure standpoint, the patient is currently stable      and is  in NYHA class IIB, but she needs to be optimized on her      medical therapy and we will add low-dose ACE inhibitor to her.  In      addition, we also possibly can add a low-dose ARB if her blood      pressure tolerates this, we may need to back off some on Lasix if      needed.  4. The patient's data were plugged into the [facility] Heart Failure      Model and if we are able to put her on ACE inhibitor as well as      Aldactone which I will start during this office visit and future      ICD, the patient's prognosis is at least 16-17 years based on the      addictive mortality benefit of the various therapies.  5. The patient also will need a repeat echo in 3-4 weeks and we will      then refer her to Dr. Graciela Husbands and I would opt for an early ICD      implant.  6. I discussed in great length needed heart failure therapies with the      patient and she was very appreciative and she will follow up with      me in the next couple of weeks.     Learta Codding, MD,FACC  Electronically Signed    GED/MedQ  DD: 04/23/2008  DT: 04/24/2008  Job #: [id number]   cc:   Lia Hopping

## 2010-06-25 NOTE — Letter (Signed)
Jun 21, 2008     RE:  [name] [name], [name] [name]  MRN:  [id number]  /  DOB:  April 17, 1950   To Whom It May Concern:   [name] [name] is under my care for severe nonischemic cardiomyopathy.  The patient should be released from jury duty due to functional  limitation associated with her heart disease.  She also recently  underwent surgical procedure requiring an implantable defibrillator.  Because of the above reasons, I do not feel that it is appropriate for  this patient to be exposed to the stress of jury duty.  If you have any  questions or concerns regarding this request, please do not hesitate to  contact me at [phone] at the [facility] in [place].    Sincerely,      Learta Codding, MD,FACC  Electronically Signed    GED/MedQ  DD: 06/21/2008  DT: 06/22/2008  Job #: [id number]

## 2010-06-25 NOTE — Assessment & Plan Note (Signed)
[facility]                          [facility]   [name] [name], [name] [name]                    MRN:          [id number]  DATE:07/11/2008                            DOB:          Sep 02, 1950    REFERRING PHYSICIAN:  Lia Hopping   The patient is a 60 year old female with history of nonischemic  cardiomyopathy.  The patient is status post CRTD.  She has been doing  extremely well.  During her last office visit, she was orthostatic and  we stopped her Lasix.  We also decreased her Aldactone and now she only  takes Lasix p.r.n.  She states that she has much improved.  This was  confirmed with a BNP level of only 116 and a BMET which was entirely  within normal limits with a potassium of 4.0.   MEDICATIONS:  1. Aspirin 81 mg a day.  2. Digoxin 0.125 daily.  3. Coumadin 5 mg as directed.  4. Macrodantin 50 mg daily.  5. Lisinopril 10 mg p.o. daily.  6. Toprol-XL 50 mg p.o. daily.  7. Aldactone 12.5 mg p.o. daily.   PHYSICAL EXAMINATION:  VITAL SIGNS:  Blood pressure 100/63, heart rate  69, weight 153.  NECK:  Normal carotid upstroke.  No carotid bruits.  LUNGS:  Clear breath sounds bilaterally.  HEART:  Regular rate and rhythm.  Normal S1 and S2.  No murmur, rubs, or  gallops.  ABDOMEN:  Soft, nontender.  No rebound or guarding.  Good bowel sounds.  EXTREMITIES:  No cyanosis, clubbing, or edema.  NEURO:  The patient is alert, oriented, and grossly nonfocal.   PROBLEM LIST:  1. Status post implantable cardioverter-defibrillator for primary      prevention.  2. Status post acute on chronic systolic heart failure.  3. Ejection fraction 25%.  4. Nonischemic cardiomyopathy.  5. History of breast cancer, chemotherapy with anthracycline therapy      with possible late cardiotoxicity.  6. Coumadin therapy secondary to recent cardioembolic event, right      middle cerebral artery.  7. A 3 x 2 aneurysm in the left internal carotid artery  in medical      therapy.  8. Orthostatic hypotension, resolved.   PLAN:  1. I made no changes in the patient's blood pressure medications and      heart failure drugs.  She is stable with no orthostasis.  2. The patient can be seen back in 6 months.  3. The patient will continue with routine EP followup.     Learta Codding, MD,FACC  Electronically Signed    GED/MedQ  DD: 07/11/2008  DT: 07/11/2008  Job #: [id number]   cc:   Lia Hopping

## 2010-06-25 NOTE — Discharge Summary (Signed)
NAMEMARLYNN, [name]           ACCOUNT NO.:  [id number]   MEDICAL RECORD NO.:  [id number]          PATIENT TYPE:  INP   LOCATION:  4730                         FACILITY:  [facility]   PHYSICIAN:  Melvyn Novas, M.D.  DATE OF BIRTH:  03-10-1950   DATE OF ADMISSION:  03/09/2008  DATE OF DISCHARGE:  03/13/2008                               DISCHARGE SUMMARY   DIAGNOSES AT THE TIME OF DISCHARGE:  1. Right middle cerebral artery cardioembolic infarct not seen on MRI      secondary to cardiomyopathy with ejection fraction 10-15%.  2. New-onset cardiomyopathy with ejection fraction 10-15%.  3. Breast cancer 1994 status post chemotherapy.  4. New-onset congestive heart failure.  5. Appendectomy.  6. Gallbladder resection.  7. Frequent urinary tract infections.   MEDICINES AT THE TIME OF DISCHARGE:  1. Macrodantin 100 mg p.o. b.i.d. x5 days, then decrease to 50 mg a      day.  2. Multivitamin a day.  3. Vitamin C a day.  4. Aspirin 81 mg a day.  5. Coumadin 5 mg a day.  6. Lanoxin 0.125 mg a day.  7. Toprol 25 mg XL a day.  8. Lasix 40 mg a day.  9. Potassium 20 mEq a day.  10.Pyridium 100 mg t.i.d. over-the-counter p.r.n.   STUDIES PERFORMED:  1. MRI done at [facility] showed no acute stroke that suggested      low flow in the right middle cerebral artery distribution and      possible clot in the M1 segment.  Chest x-ray at [facility] showed      mild congestive heart failure.  2. CT of the head at [facility] was unremarkable.  3. MRA of the brain at [facility] showed focal area of flow signal      drop off in the right MCA with a superior division of the right MCA      most consistent with an embolic thrombus of 3 x 2 mm focal      outpouching in the superior  hypophyseal region of the left ICA      suspicious of an aneurysm.  4. Chest x-ray at [facility] shows mild cardiomegaly, no active lung      disease.  Mild peribronchial thickening.  5. Two-D echocardiogram shows EF of  15-20% with diffuse hypokinesis,      inferoseptal posterior akinesis, no definite LV thrombus seen.      Some of the myocardium near the apex is echo bright.  Left      ventricle moderately dilated, moderate mitral valve regurgitation.      Left atrium moderately dilated, moderate pulmonic regurgitation and      small pericardial effusion.  Carotid Doppler shows no ICA stenosis.      EKG shows normal sinus rhythm with possible left atrial      enlargement, left anterior fascicular block, anterior infarct, ST      and T-wave abnormality and remained stable during hospitalization.  6. Transcranial Doppler was performed, results were pending.   LABORATORY STUDIES:  BNP March 10, 2008 2506 down to 713 on March 13, 2008.  Urine culture shows greater than 100,000 colonies of Gram-  negative rods, actual culture is pending.  PT was 25.4 with INR of 2.2  and heparin level was 0.55.  Chemistry showed glucose 105, otherwise  normal liver function test with total protein 5.9, albumin 3.2,  otherwise normal.  Random urine sodium 107.  Urine pregnancy test  negative.  Homocystine 4.8.  Cardiac enzymes with CK 83, CK-MB 1.4 and  troponin 0.1.  TSH 1.096.  Cholesterol 128, triglycerides 65, HDL 33,  LDL 82, hemoglobin A1c 6.0.  ABG on admission with pH 7.48, PO2 74.2,  bicarb 22.9, PCO2 23.8 and now with O2 sat at 95.5.   HISTORY OF PRESENT ILLNESS:  [name] is a 60 year old  right-handed Caucasian female with a history of breast cancer in 1994.  The patient received Adriamycin and another chemotherapy agent she  cannot remember.  Following treatment of the breast cancer, the patient  has had documented ejection fractions in the mid 40s.  The patient  however began having problems with orthopnea, shortness of breath and  over the past 10 days prior to admission, the patient noted sudden onset  of left hemiparesis at 7:30 a.m. that resolved within 20 minutes.  She  had a gait  deviation to the right and a pronounced left hemiparesis.  She presented to [facility] where an MRI of the brain showed no  acute stroke event but the MRI suggested low flow in the right middle  cerebral artery and possible clot in the M1 segment.  The patient had a  chest x-ray that showed mild congestive heart failure and a troponin  level of 0.10.  The patient was transferred to [facility] for further  evaluation.   From the stroke perspective, the patient's neurologic symptoms recovered  in the hospital.  It was felt that she had a right embolic infarct that  was not seen on MRI.  She had some mild finger-nose ataxia on the left  and right arm would orbit her left arm.  Other than that, her deficits  were minimal.  She was evaluated by PT and OT and had no outpatient  therapy needs.   Cardiology was consulted to address new congestive heart failure.  Medications were added including IV heparin and Coumadin.  She  apparently has a history of low blood pressure and was started on  captopril in addition to her other medicines in the hospital but this  was discontinued as blood pressure remained low.  Plans are to follow up  with her on Wednesday at the cardiologist's office in [place].  They  were to continue the Lasix and potassium as well as Toprol, hold  captopril, continue Coumadin and aspirin 81 mg.  She needs an outpatient  MRI of her heart for hyperechoic region of myocardium on the 2-D  echocardiogram.  She needs close followup of her blood pressure.  At  some point, she most likely needs a cardiac cath.   Of note, the patient also has history of chronic UTIs.  Urine culture  grew 100,000 colonies of Gram-negative rods with final culture pending.  She is on Macrodantin at home at low-dose but we will increase her dose  for 5 days then return to normal dose and follow up with her primary  care physician.  She has also been asked to follow up with Dr. Cleone Slim  related to  her Evista which she has not been taking regularly.   CONDITION AT DISCHARGE:  The patient alert and oriented x3.  Speech  clear.  No aphasia.  Extraocular movements are intact.  Visual fields  are full.  Face is symmetric.  Tongue is midline.  She has decreased  fine motor movement in the left arm and she orbits her right over her  left.  She has some finger-nose-finger ataxia but no lower extremity  weakness and her gait steady.   DISCHARGE PLAN:  1. Discharge home with family.  2. Coumadin and low-dose aspirin for secondary stroke prevention.      Follow up with [facility] on Wednesday in [name]      for dose adjustments as needed.  3. Follow up Dr. Cleone Slim on March 27, 2008.  4. Follow up Dr. Delia Heady in 2-3 months.  5. Follow up primary care physician in 1 month.      Annie Main, N.P.      Melvyn Novas, M.D.  Electronically Signed    SB/MEDQ  D:  03/13/2008  T:  03/14/2008  Job:  [id number]   cc:   Pramod P. Pearlean Brownie, MD  [facility]  Lia Hopping  Lynett Fish, M.D.

## 2010-06-25 NOTE — H&P (Signed)
[name] [name], DERRIG           ACCOUNT NO.:  [id number]   MEDICAL RECORD NO.:  [id number]          PATIENT TYPE:  INP   LOCATION:  3027                         FACILITY:  [facility]   PHYSICIAN:  Pricilla Riffle, MD, FACCDATE OF BIRTH:  1950-06-13   DATE OF ADMISSION:  03/09/2008  DATE OF DISCHARGE:                              HISTORY & PHYSICAL   [name] [name] is a very pleasant 60 year old. Caucasian female with no  known history of coronary artery disease or congestive heart failure.  She initially presented to [facility] today by EMS complaining of  left-sided weakness.  She also complained of dyspnea with minimal  exertion.  At [facility], CT of the head obtained that was  negative for bleed.  MRI negative for ischemia, however, questionable  right MCA thrombus.  The patient was transferred to [facility] under the  care of Dr. Anne Hahn.  The patient also complained of dyspnea with minimal  exertion.  Chest x-ray showed moderate cardiomegaly with mild diffuse  interstitial pulmonary edema and small bilateral pleural effusions.  A  12-lead EKG showed sinus rhythm with the right bundle branch block and  also questionable anterior MI with positive Q-waves.  Blood work at  [facility] also showed a BNP of 18.06 with mild bump in troponin of 0.11,  CK-MB of 2.2.  The patient complains of flu-like symptoms and congestion  that started 2 months ago prior to Thanksgiving.  She states she felt  like she recovered and then became sick again, also had an episode of  shingles and what sounds like sinus infection, possibly.  For the last 2  weeks, she has had increased shortness of breath with orthopnea.  She  states she is now sleeping on 3-4 pillows.  She is complaining of lower  extremity edema, abdominal fullness, nocturia, and early satiety, and  decreased appetite.  She had a fever during that illness with a  temperature greater than 101.  She currently is on 3000 at [facility].  Neural changes have resolved and the patient is pending repeat MRI.  She  has also received 20 mg of Lasix IV and has had a total of 3000 mL urine  output in her Foley cath.   PAST MEDICAL HISTORY:  Positive for breast cancer status post right  mastectomy treated with Adriamycin in 1994.  Echocardiogram in 1996 at  [facility].  The patient states her EF was 40-45%.  Her last  Adriamycin treatment was in 1995.  Status post cholecystectomy, status  post hysterectomy.  Denies any history of thyroid problems,  hypertension, or dyslipidemia.   MEDICATION:  The patient is currently on Macrodantin for UTI.   No known drug allergies.   SOCIAL HISTORY:  She lives in [place] with her husband.  She has a son and  a daughter-in-law, who is a Engineer, civil (consulting) in [place] here visiting.  Denies  any history of tobacco or EtOH use.   FAMILY HISTORY:  Concerning for heart failure on both sides.  Father had  cardiomyopathy, deceased in his 72s secondary to MI.  Mother with a  history of CHF, questionable valve  problems, and died from a stroke.  She has one brother who is followed at [facility] by Dr. Antoine Poche.  He had a  cardiac transplant secondary to a viral cardiomyopathy and second  brother also with cardiomyopathy with EF of 10%.   REVIEW OF SYSTEMS:  Positive for hematuria with recent UTI, orthopnea,  PND, lower extremity edema, dyspnea on exertion, abdominal fullness,  early satiety, decreased appetite.  Positive for nocturia.  Positive for  increased frequency and urgency.  Positive for dizziness prior to CVA.  All other review of systems negative.   PHYSICAL EXAMINATION:  VITAL SIGNS:  Temperature 98, heart rate 95,  respirations 18, blood pressure 109/77, sat 96% on 2 L.  GENERAL:  [name] is in no acute distress.  HEENT:  Unremarkable.  NECK:  Supple without lymphadenopathy or bruits.  She has JVD around 8  cm.  LUNGS:  She still has crackles in the right base, otherwise clear to   auscultation.  CARDIOVASCULAR:  S1 and S2, regular rate and rhythm, I do not hear any  murmurs, rubs, or gallops.  ABDOMEN:  Soft, nontender.  Positive bowel sounds.  EXTREMITIES:  Lower extremities without clubbing, cyanosis, or edema.  2+ pedal pulses bilaterally.  NEUROLOGIC:  Alert and oriented x3.   EKG sinus rhythm with a left anterior fascicular block, questionable  anterior MI, incomplete right bundle branch block also with prolonged  QT.   Lab work obtained here thus far show an H and H of 13.2 and 39.5  respectively, WBCs 8.2, platelets 168,000, INR 1.2.   IMPRESSION:  A 60 year old Caucasian female from [place] with transient  ischemic attack, with flu-like symptoms over the last 2 months that  never resolved, with presyncopal/dizziness yesterday resolved, with  episode of slurred speech and left-sided weakness today resulting in  several falls.  Symptoms resolved en route to hospital.  The patient  with symptoms suggestive of heart failure with abnormal EKG and recent  illness also with a history of Adriamycin therapy and previous ejection  fraction known to be 40-45%.  Strong family history of congestive heart  failure and coronary artery disease.  Symptoms worrisome for embolic  event.  We will continue with diuresis, check lipids, thyroid,  telemetry, aspirin, 2-D echocardiogram.  We will have echo done now.  Dr. Dietrich Pates has been in to examine and assess the patient.  We will  talk with Dr. Anne Hahn regarding further reassess in the a.m. an for other  medications in regards to her heart failure.      [name] [name], ACNP      Pricilla Riffle, MD, [facility]  Electronically Signed    MB/MEDQ  D:  03/09/2008  T:  03/10/2008  Job:  [id number]

## 2010-06-25 NOTE — Assessment & Plan Note (Signed)
[facility]                          [facility]   [name] [name], [name] [name]                    MRN:          [id number]  DATE:06/06/2008                            DOB:          20-Apr-1950    REFERRING PHYSICIANS:  Dr. Olena Leatherwood and Dr. Reuel Boom   HISTORY OF PRESENT ILLNESS:  The patient is a 60 year old female with a  history of nonischemic cardiomyopathy.  Her ejection fraction is 25%.  The patient is status post recent ICD implant.  She received a dual  chamber device due to underlying bradycardia.  She is currently A-  pacing.  I received a call from Dr. Olena Leatherwood, who was worried that the  patient was pacing frequently.  He wanted me to further evaluate this.  As it turns out that the patient just had a ICD interrogation and she  was at that time also A pacing.  She is on Toprol 50 mg a day, which she  usually takes at night.  I asked her to take this in the morning.  However, there is no ventricular pacing and the patient's symptoms have  not worsened.  She does report dizziness when she bends over.  Prior to  hospitalization, the patient also exhibited some orthostatic hypotension  when she was initiated on a more aggressive medical regimen.  Also her  BUN has doubled from 12 to 23 in the beginning of March when we  increased her medications.  Overall, she is doing well.  She denies any  chest pain, shortness of breath, orthopnea, or PND.   MEDICATIONS:  1. Aspirin 81 mg p.o. daily.  2. Digoxin 0.125 mg p.o. daily.  3. Toprol-XL 25 mg p.o. daily.  4. Coumadin 5 mg as directed.  5. Macrodantin 50 mg p.o. daily.  6. Lasix 40 mg half a tablet p.o. daily.  7. Aldactone 25 mg p.o. daily.  8. Lisinopril 10 mg p.o. daily.  9. Toprol-XL 50 mg p.o. daily.   PHYSICAL EXAMINATION:  VITAL SIGNS:  Blood pressure 109/60, heart rate  64, and weight is 167 pounds.  NECK:  Normal carotid upstrokes.  No carotid bruit.  LUNGS:  Clear breath  sounds bilaterally.  HEART:  Regular rate and rhythm.  Normal S1 and S2.  No murmurs, rubs,  or gallops.  ABDOMEN:  Soft and nontender.  No rebound or guarding.  Good bowel  sounds.  EXTREMITIES:  No cyanosis, clubbing, or edema.  NEUROLOGIC:  The patient is alert and oriented, grossly nonfocal.  SKIN:  The ICD pocket appears  to be well healed.   PROBLEMS:  1. Status post implantable cardioverter-defibrillator placement for      primary prevention.  2. Status post acute-on-chronic systolic heart failure.  3. Ejection fraction 25%.  4. Nonischemic cardiomyopathy.  5. History of breast cancer, chemotherapy with anthracycline therapy      with possible cardiotoxicity.  6. Coumadin therapy secondary to recent cardioembolic event, right      middle cerebral artery.  7. 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  8. Orthostatic hypotension.   PLAN:  1.  The patient will get formal orthostatic blood pressures done.  I      suspect that she is orthostatic.  2. I asked her to take her Lasix only p.r.n., cut her Aldactone to      12.5 mg p.o. daily.  We will check a BMET and BNP level in 1 week.  3. The patient remains somewhat dizzy, weak, and orthostatic.  We will      further decrease her Toprol to allow intrinsic pacing.     Learta Codding, MD,FACC  Electronically Signed    GED/MedQ  DD: 06/06/2008  DT: 06/07/2008  Job #: [id number]   cc:   Lavonda Jumbo

## 2010-06-25 NOTE — Assessment & Plan Note (Signed)
[facility]                          [facility]   LADREA, HOLLADAY                    MRN:          [id number]  DATE:05/10/2008                            DOB:          [birth year]    REFERRING PHYSICIAN:  Dr. Reuel Boom   HISTORY OF PRESENT ILLNESS:  The patient is a very pleasant 60 year old  female with a history of anthracycline cardiomyopathy.  The patient  presented with class IV heart failure symptoms and pulmonary edema in  January to [facility].  This was her first clinical presentation with  heart failure after she was administered anthracyclines in the 1990s.  The patient does have a brother, who required a transplant and a  familial cardiomyopathy cannot be excluded.  The patient saw me for  initial visit on April 18, 2008, and spend significant time with the  patient talking about the ability of medications and more advanced  therapies in the setting of chronic congestive heart failure.  The  patient has a presumed nonischemic cardiomyopathy, although she never  had a stress test done and we will order this prior to her visit with  Dr. Graciela Husbands on May 22, 2008.  The patient's ejection fraction initially  was 20-25%.  She has now been uptitrated on her medical therapy,  although not quite a target doses yet.  The patient does report heart  failure symptoms and she is currently in NYHA class IIB.  She is able to  tolerate the initiation of Aldactone and lisinopril started during her  last clinic visit.   MEDICATIONS:  1. Aspirin 81 mg p.o. daily.  2. Digoxin 0.125 mg p.o. daily.  3. Toprol-XL 25 mg p.o. daily.  4. Coumadin 5 mg as directed.  5. Lasix 40 mg half a tablet p.o. daily.  6. Macrodantin 50 mg p.o. daily.  7. Lasix 20 mg p.o. daily.  8. Aldactone 25 mg p.o. daily.  9. Lisinopril 10 mg p.o. daily   PHYSICAL EXAMINATION:  VITAL SIGNS:  Blood pressure 102/67, heart rate  85, and weight 167 pounds.  GENERAL:   A well-nourished white female in no apparent distress.  HEENT:  Pupils are isocoric.  Conjunctivae clear.  NECK:  Supple.  Normal carotid upstroke and no carotid bruits.  LUNGS:  Clear breath sounds bilaterally.  HEART:  Regular rate and rhythm.  Normal S1 and S2.  No pathological  murmurs.  ABDOMEN:  Soft, nontender.  No rebound or guarding.  Good bowel sounds.  EXTREMITIES:  No cyanosis, clubbing, or edema.   PROBLEM LIST:  1. Status post acute on chronic systolic heart failure.  2. Ejection fraction 20-25%, likely nonischemic cardiomyopathy, [place]      [place] Heart Association class IIB currently.  3. History of anthracycline therapy in the 1990s.  4. Family history of cardiomyopathy.  5. History of hypotension on angiotensin-converting enzyme inhibitor,      but currently tolerating.  6. Hematuria being evaluated.  7. Coumadin therapy secondary to recent cardioembolic events in the      right middle cerebral artery.  8. Small 3 x 2-mm aneurysm of the  internal carotid artery as      previously outlined.   PLAN:  1. The patient will further uptitrate her medical therapy of Toprol-XL      to 50 mg p.o. daily.  2. The patient has a followup appointment scheduled with Dr. Graciela Husbands.      Just prior to this, she will have a repeat echocardiographic study      to reassess her ejection fraction and also a stress test to rule      out the unlikely possibility of an ischemic cardiomyopathy.  3. At this point, I would favor early placement of an ICD.  The      patient may have some recovery of her LV function with medical      therapy; but given the family history and her late presentation of      anthracycline toxicity, make me suggest that ICD therapy is      indicated.  4.  The patient will follow up with [place] in 1-2 months      for further medical therapy and we will await the recommendations      with Dr. Graciela Husbands.     Learta Codding, MD,FACC  Electronically Signed    GED/MedQ  DD:  05/10/2008  DT: 05/10/2008  Job #: [id number]   cc:   Donzetta Sprung

## 2010-06-25 NOTE — Cardiovascular Report (Signed)
[facility]                   [facility]   [name] [name], [name] [name]                    MRN:          [id number]  DATE:08/23/2008                            DOB:          07/13/1950    [name] [name] was seen in followup for ICD implanted for primary  prevention in the setting of nonischemic cardiomyopathy.  She has done  well.  The device is well-healed and she is back to walking up to about  a mile a day.   Her medications are reviewed and are unchanged include:  1. Aspirin.  2. Digoxin 0.125.  3. Coumadin.  4. Macrodantin.  5. Lisinopril 10.  6. Toprol 50.  7. Aldactone 12.5.   PHYSICAL EXAMINATION:  VITAL SIGNS:  Today her blood pressure is 109/66,  her pulse is 65, her weight is 154.  LUNGS:  Clear.  HEART:  Sounds are regular.  Device pocket was well healed.  ABDOMEN:  Soft.  EXTREMITIES:  Without edema.  NEUROLOGIC:  Grossly normal.  SKIN:  Warm and dry.   Interrogation of her [employer] pulse generator demonstrates a battery  voltage of greater than 3.2 with a P-wave of 1.6 and R-wave of 11.8.  Atrial impedance was 440 and V was 450.  High-voltage impedance was 53  ohms.   Thresholds were 0.5 in the A and 0.75 in the V.   IMPRESSION:  1. Nonischemic cardiomyopathy.  2. Status post implantable cardioverter-defibrillator for the above.  3. Prior stroke with recent testing suggested that there may be an      aneurysm and scheduled for CT scan.  4. Family history of cardiomyopathy.   [name] [name] is doing really quite well from my point of view.  We will  try to understand what course to pursue in terms of elucidating the  potential and familial contribution of her cardiomyopathy.  I will  discuss her case with Dr. Jarold Motto down at [facility]     Duke Salvia, MD, [facility]  Electronically Signed    SCK/MedQ  DD: 08/23/2008  DT: 08/24/2008  Job #: [id number]

## 2010-06-25 NOTE — H&P (Signed)
NAMELUDMILA, EBARB           ACCOUNT NO.:  [id number]   MEDICAL RECORD NO.:  [id number]          PATIENT TYPE:  INP   LOCATION:  3027                         FACILITY:  [facility]   PHYSICIAN:  Marlan Palau, M.D.  DATE OF BIRTH:  April 15, 1950   DATE OF ADMISSION:  03/09/2008  DATE OF DISCHARGE:                              HISTORY & PHYSICAL   HISTORY OF PRESENT ILLNESS:  Lachele Lievanos is a 60 year old right-  handed white female born on 1950-11-01, with a history of breast  cancer in 1994.  The patient received Adriamycin and another  chemotherapy agent she cannot remember the name of.  Following treatment  of the breast cancer, the patient has had documented lower ejection  fractions in the mid 40s.  The patient, however, began having problems  with orthopnea and shortness of breath over the last 10 days.  The  patient noted sudden onset of left hemiparesis today at 7:30 a.m. that  resolved within about 20 minutes.  The patient had gait deviation to the  right and pronounced left hemiparesis.  The patient went to [facility].  An MRI scan of the brain showed no stroke event, but MRI  suggested low-flow in the right middle cerebral artery distribution and  possible clot within the M-1 segment of the middle cerebral artery.  The  patient had a chest x-ray showing evidence of mild congestive heart  failure and troponin I level 0.1.  The patient was transferred to the  Neurologic Service for further management.   PAST MEDICAL HISTORY:  Significant for:  1. History of embolic TIA of the right brain as above.  2. Breast cancer in 1994, status post chemotherapy.  3. New onset congestive heart failure.  4. Appendectomy.  5. Gallbladder resection.  6. Frequent urinary tract infections.   MEDICATIONS:  None.   The patient has no known allergies.  She does not smoke or drink.   SOCIAL HISTORY:  This patient is married, lives in the [place], [place]  [place] area.  Has 1  son who is alive and well.  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   FAMILY MEDICAL HISTORY:  Notable that mother died with heart disease and  stroke.  Father died with heart disease.  The patient has 3 brothers and  2 sisters. The patient has 1 brother who has had heart transplant, 1  sister with ovarian cancer, 1 brother also has heart disease.   REVIEW OF SYSTEMS:  Notable for no recent fevers or chills.  The patient  denied significant headache.  Did feel some dizziness.  Does not know if  she had any visual disturbance or not.  Did have weakness on the left  side.  The patient has had no chest pain, some slight chest pressure  over the last week or so.  Definitely, she has had orthopnea and  shortness of breath.  Denies any problems controlling bowels or bladder.  Denies any abdominal pain.  Has not had any blackout episodes.   PHYSICAL EXAMINATION:  VITAL SIGNS:  Blood pressure is 117/81, heart  rate 104, respiratory rate 20, and temperature afebrile.  GENERAL:  This patient is a fairly well-developed white female who is  alert and cooperative at the time of examination.  HEENT:  Head is atraumatic.  Eyes, pupils are equal, round and reactive  to light.  Disks are soft and flat bilaterally.  NECK:  Supple.  No carotid bruits noted.  RESPIRATORY:  Clear.  The patient did have bibasilar rales.  CARDIOVASCULAR:  Regular rate and rhythm.  No obvious murmurs or rubs  noted.  EXTREMITIES:  Without significant edema.  ABDOMEN:  Positive bowel sounds.  No organomegaly or tenderness is  noted.  NEUROLOGIC:  Cranial nerves as above.  Facial symmetry is present.  The  patient has good sensation of face to pinprick and soft touch  bilaterally.  She has good strength of facial muscles and muscles with  head turning and shoulder shrug bilaterally.  Speech is well enunciated,  not aphasic.  Motor testing reveals 5/5 strength in all 4s.  Good  symmetric motor tone is noted  throughout.  Sensory testing is intact to  pinprick, soft touch, and vibratory sensation throughout.  The patient  has good finger-nose-finger and heel-to-shin.  Gait was not tested.  Deep tendon reflexes are symmetric and normal.  Toes are downgoing  bilaterally.  No aphasia noted.  NIH stroke scale score was 0.   LABORATORY VALUES:  Notable for sodium 140, potassium 3.7, chloride of  111, CO2 of 24, BUN of 21, creatinine 0.8, glucose 122, calcium 8.3, CK-  MB of 2.2, troponin I of 0.11, total CK of 72.  The patient has an INR  of 1.3.  White count of 8.4, hemoglobin of 11.7, hematocrit of 34.8, MCV  of 87.6, and platelet count of 147.    CT of the head at [facility] is unremarkable.  MRI study did show  blow flow in the right middle cerebral artery distribution, no stroke.  A CT angiogram was done, but I do not have the report of this.  Chest x-  ray did show mild pulmonary edema with cardiomegaly.   IMPRESSION:  1. Transient ischemic attack event, right brain, likely cardioembolic.  2. New onset congestive heart failure, symptoms present for 10 days.  3. History of breast cancer.   This patient has an NIH stroke scale score of 0.  She is not a candidate  for TPA because of this.  The patient has had new cardiac symptoms and  the congestive heart failure may be her primary risk factor for stroke.  The patient has a very strong family history for heart disease.  We will  pursue further evaluation at this point.   PLAN:  1. The patient will be admitted to [facility].  2. MRI angiogram of intracranial and extracranial vessels.  3. A 2-D echocardiogram.  4. Aspirin therapy.  5. Gentle fluid hydration.  6. Cardiology evaluation.  7. Follow cardiac enzymes.  The patient could be considered for TPA if      symptoms recur.  We will follow the patient's clinical course while      in-house.      Marlan Palau, M.D.  Electronically Signed     CKW/MEDQ  D:   03/09/2008  T:  03/10/2008  Job:  [id number]   cc:   Lia Hopping  [facility]

## 2010-07-09 ENCOUNTER — Ambulatory Visit (INDEPENDENT_AMBULATORY_CARE_PROVIDER_SITE_OTHER): Payer: BC Managed Care – PPO | Admitting: *Deleted

## 2010-07-09 DIAGNOSIS — Z7901 Long term (current) use of anticoagulants: Secondary | ICD-10-CM

## 2010-07-09 DIAGNOSIS — I6350 Cerebral infarction due to unspecified occlusion or stenosis of unspecified cerebral artery: Secondary | ICD-10-CM

## 2010-07-09 LAB — POCT INR: INR: 2.3

## 2010-07-31 IMAGING — CR DG CHEST 2V
2 series · 2 of 2 positions shown · non-contrast
Comparison: 03/10/2008

CLINICAL DATA: Status post defibrillator placement

CHEST - 2 VIEW

[w chest pa]
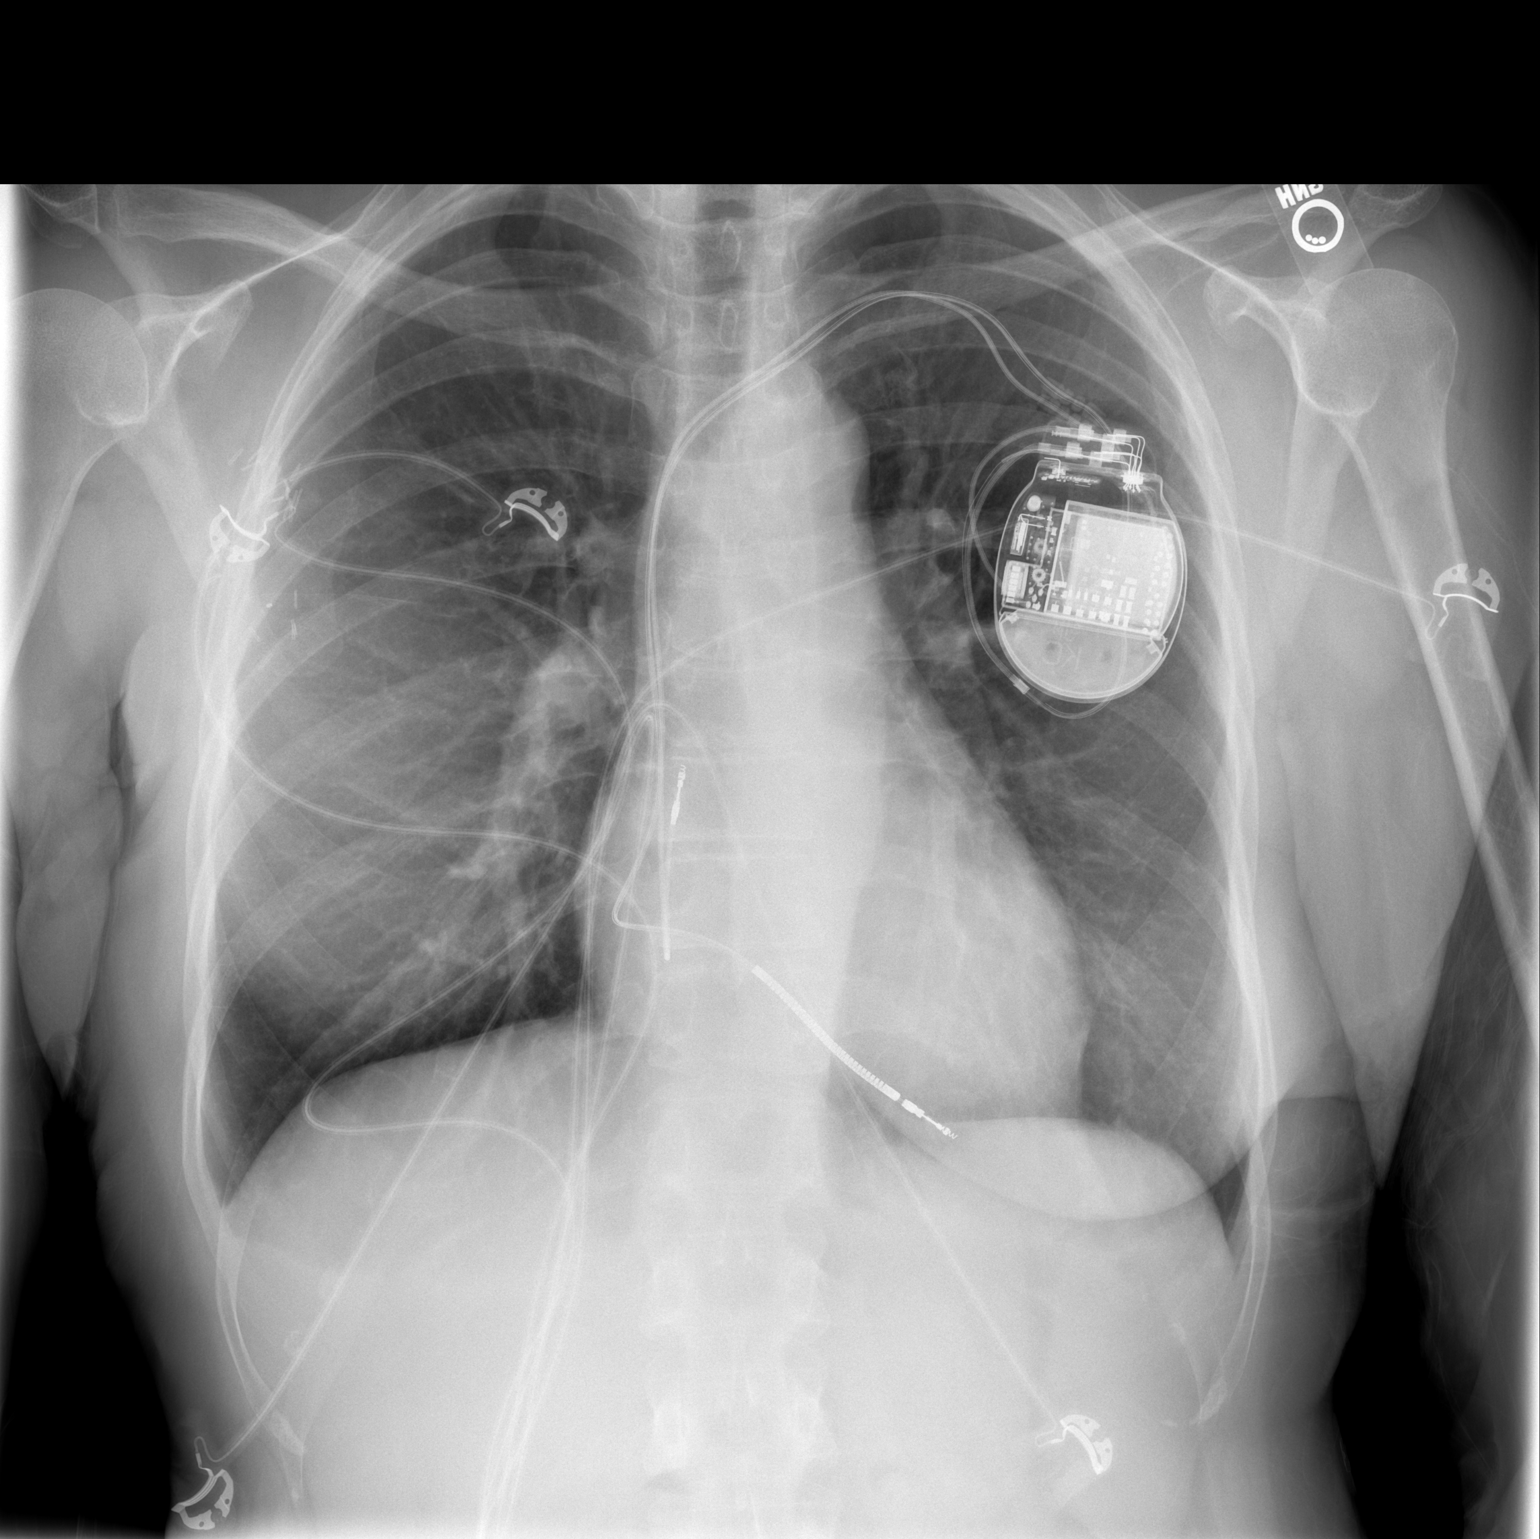

[w chest lat]
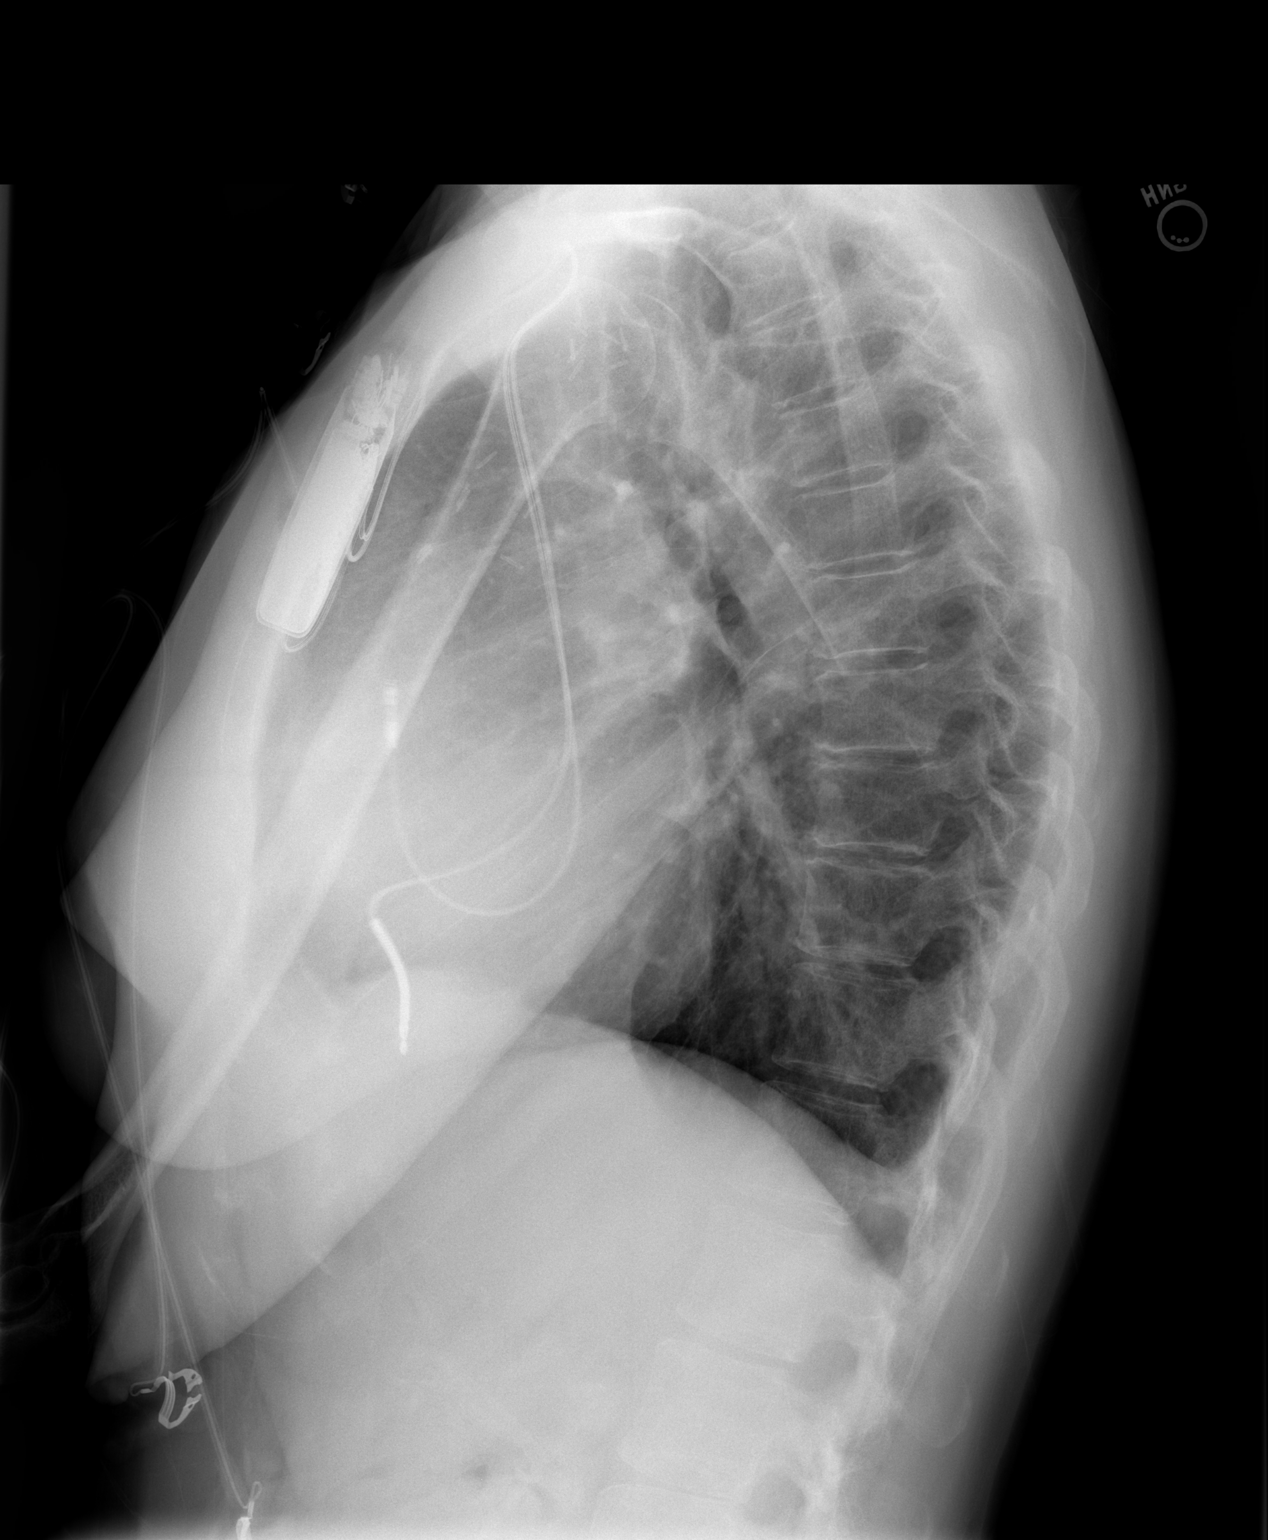

[2 of 2 positions shown; findings below may reference images not displayed]

FINDINGS: Lungs are hyperexpanded but clear.  Tiny right pleural
effusion is evident. The cardiopericardial silhouette is within
normal limits for size. Left-sided AICD noted with lead tips
overlying the right atrial appendage and right ventricular apex.
No evidence for pneumothorax. Telemetry leads overlie the chest.
The patient is status post right mastectomy with surgical clips
noted in the right axilla.
IMPRESSION: Status post left-sided AICD placement without evidence for
pneumothorax.

Emphysema with tiny right pleural effusion.

## 2010-08-06 ENCOUNTER — Ambulatory Visit (INDEPENDENT_AMBULATORY_CARE_PROVIDER_SITE_OTHER): Payer: BC Managed Care – PPO | Admitting: *Deleted

## 2010-08-06 DIAGNOSIS — Z7901 Long term (current) use of anticoagulants: Secondary | ICD-10-CM

## 2010-08-06 DIAGNOSIS — I6350 Cerebral infarction due to unspecified occlusion or stenosis of unspecified cerebral artery: Secondary | ICD-10-CM

## 2010-08-19 ENCOUNTER — Encounter: Payer: Self-pay | Admitting: Internal Medicine

## 2010-08-20 ENCOUNTER — Encounter: Payer: Self-pay | Admitting: Internal Medicine

## 2010-08-20 ENCOUNTER — Ambulatory Visit (INDEPENDENT_AMBULATORY_CARE_PROVIDER_SITE_OTHER): Payer: BC Managed Care – PPO | Admitting: Internal Medicine

## 2010-08-20 DIAGNOSIS — I6350 Cerebral infarction due to unspecified occlusion or stenosis of unspecified cerebral artery: Secondary | ICD-10-CM

## 2010-08-20 DIAGNOSIS — I428 Other cardiomyopathies: Secondary | ICD-10-CM

## 2010-08-20 DIAGNOSIS — I5022 Chronic systolic (congestive) heart failure: Secondary | ICD-10-CM

## 2010-08-20 DIAGNOSIS — Z95810 Presence of automatic (implantable) cardiac defibrillator: Secondary | ICD-10-CM

## 2010-08-20 LAB — ICD DEVICE OBSERVATION
AL AMPLITUDE: 2.3 mv
AL THRESHOLD: 0.5 V
BATTERY VOLTAGE: 3.1283 V
DEVICE MODEL ICD: 468775
FVT: 0
HV IMPEDENCE: 70 Ohm
MODE SWITCH EPISODES: 0
PACEART VT: 0
RV LEAD AMPLITUDE: 11.8 mv
TOT-0009: 1
TOT-0010: 10
TZAT-0001FASTVT: 1
TZAT-0013SLOWVT: 3
TZAT-0018FASTVT: NEGATIVE
TZAT-0018SLOWVT: NEGATIVE
TZAT-0020SLOWVT: 1 ms
TZON-0003FASTVT: 280 ms
TZON-0003SLOWVT: 330 ms
TZON-0005SLOWVT: 6
TZON-0010FASTVT: 80 ms
TZST-0001FASTVT: 2
TZST-0001SLOWVT: 2
TZST-0001SLOWVT: 4
TZST-0003FASTVT: 36 J
TZST-0003FASTVT: 36 J
TZST-0003SLOWVT: 30 J

## 2010-08-20 NOTE — Assessment & Plan Note (Signed)
Will discontinue aspirin given her attendant warfarin

## 2010-08-20 NOTE — Assessment & Plan Note (Signed)
The patient's device was interrogated.  The information was reviewed. No changes were made in the programming.    Is one episode of atrial noise and one magnet reversion

## 2010-08-20 NOTE — Assessment & Plan Note (Signed)
Stable on current meds 

## 2010-08-20 NOTE — Assessment & Plan Note (Signed)
Continue current medications. 

## 2010-08-20 NOTE — Patient Instructions (Signed)
Your physician has recommended you make the following change in your medication:  1) Stop Aspirin.  Your physician wants you to follow-up in: 1 year. You will receive a reminder letter in the mail two months in advance. If you don't receive a letter, please call our office to schedule the follow-up appointment.

## 2010-08-20 NOTE — Progress Notes (Signed)
  HPI  [name] [name] is a 60 y.o. female In followup for an ICD implanted for primary prevention in the setting of nonischemic cardiomyopathy.  The patient denies chest pain, shortness of breath, nocturnal dyspnea, orthopnea or peripheral edema.  There have been no palpitations, lightheadedness or syncope.        Past Medical History  Diagnosis Date  . Breast cancer     hx  . Cerebrovascular disease     s/p right MCA cardioemoblic infarct 5/62/13  . Fatigue   . Dyspnea   . Edema   . CHF (congestive heart failure)     Past Surgical History  Procedure Date  . Cardiac defibrillator placement     St. Jude remote    Current Outpatient Prescriptions  Medication Sig Dispense Refill  . aspirin 81 MG tablet Take 81 mg by mouth daily.        . digoxin (LANOXIN) 0.125 MG tablet Take 125 mcg by mouth daily.        . furosemide (LASIX) 40 MG tablet Take 1 tablet (40 mg total) by mouth daily. Dose increase  90 tablet  3  . lisinopril (PRINIVIL,ZESTRIL) 10 MG tablet Take 10 mg by mouth daily.        . metoprolol (TOPROL-XL) 50 MG 24 hr tablet Take 50 mg by mouth daily.        . nitrofurantoin (MACRODANTIN) 50 MG capsule Take 50 mg by mouth at bedtime.        Marland Kitchen spironolactone (ALDACTONE) 25 MG tablet Take by mouth. Take 1/2 tablet daily       . warfarin (COUMADIN) 5 MG tablet Take by mouth as directed.          No Known Allergies  Review of Systems negative except from HPI and PMH  Physical Exam Well developed and well nourished in no acute distress HENT normal E scleral and icterus clear Neck Supple JVP flat; carotids brisk and full Clear to ausculation Regular rate and rhythm, no murmurs gallops or rub Soft with active bowel sounds No clubbing cyanosis and edema Alert and oriented, grossly normal motor and sensory function Skin Warm and Dry   Assessment and  Plan

## 2010-09-03 ENCOUNTER — Ambulatory Visit (INDEPENDENT_AMBULATORY_CARE_PROVIDER_SITE_OTHER): Payer: BC Managed Care – PPO | Admitting: *Deleted

## 2010-09-03 DIAGNOSIS — Z7901 Long term (current) use of anticoagulants: Secondary | ICD-10-CM

## 2010-09-03 DIAGNOSIS — I6350 Cerebral infarction due to unspecified occlusion or stenosis of unspecified cerebral artery: Secondary | ICD-10-CM

## 2010-09-03 LAB — POCT INR: INR: 3

## 2010-09-09 ENCOUNTER — Other Ambulatory Visit: Payer: Self-pay | Admitting: *Deleted

## 2010-09-09 MED ORDER — DIGOXIN 125 MCG PO TABS: 125.0000 ug | ORAL_TABLET | Freq: Every day | ORAL | Status: DC

## 2010-09-09 MED ORDER — LISINOPRIL 10 MG PO TABS: 10.0000 mg | ORAL_TABLET | Freq: Every day | ORAL | Status: DC

## 2010-09-09 MED ORDER — SPIRONOLACTONE 25 MG PO TABS: ORAL_TABLET | ORAL | Status: DC

## 2010-09-17 ENCOUNTER — Other Ambulatory Visit: Payer: Self-pay | Admitting: *Deleted

## 2010-09-17 MED ORDER — WARFARIN SODIUM 5 MG PO TABS: 5.0000 mg | ORAL_TABLET | ORAL | Status: DC

## 2010-09-17 MED ORDER — FUROSEMIDE 40 MG PO TABS: 40.0000 mg | ORAL_TABLET | Freq: Every day | ORAL | Status: DC

## 2010-09-17 MED ORDER — METOPROLOL SUCCINATE ER 50 MG PO TB24: 50.0000 mg | ORAL_TABLET | Freq: Every day | ORAL | Status: DC

## 2010-10-01 ENCOUNTER — Encounter: Payer: Self-pay | Admitting: Cardiology

## 2010-10-01 ENCOUNTER — Ambulatory Visit (INDEPENDENT_AMBULATORY_CARE_PROVIDER_SITE_OTHER): Payer: BC Managed Care – PPO | Admitting: *Deleted

## 2010-10-01 ENCOUNTER — Ambulatory Visit (INDEPENDENT_AMBULATORY_CARE_PROVIDER_SITE_OTHER): Payer: BC Managed Care – PPO | Admitting: Cardiology

## 2010-10-01 VITALS — BP 93/57 | HR 73 | Ht 68.0 in | Wt 156.0 lb

## 2010-10-01 DIAGNOSIS — Z7901 Long term (current) use of anticoagulants: Secondary | ICD-10-CM

## 2010-10-01 DIAGNOSIS — I6350 Cerebral infarction due to unspecified occlusion or stenosis of unspecified cerebral artery: Secondary | ICD-10-CM

## 2010-10-01 DIAGNOSIS — I5022 Chronic systolic (congestive) heart failure: Secondary | ICD-10-CM

## 2010-10-01 DIAGNOSIS — Z95810 Presence of automatic (implantable) cardiac defibrillator: Secondary | ICD-10-CM

## 2010-10-01 DIAGNOSIS — I428 Other cardiomyopathies: Secondary | ICD-10-CM

## 2010-10-01 MED ORDER — FUROSEMIDE 40 MG PO TABS: 20.0000 mg | ORAL_TABLET | Freq: Every day | ORAL | Status: DC

## 2010-10-01 MED ORDER — SPIRONOLACTONE 25 MG PO TABS: 25.0000 mg | ORAL_TABLET | Freq: Every day | ORAL | Status: DC

## 2010-10-01 NOTE — Assessment & Plan Note (Signed)
Follow Dr. Graciela Husbands. No defibrillator discharges

## 2010-10-01 NOTE — Assessment & Plan Note (Signed)
Nonischemic cardio myopathy: Dramatic improvement in ejection fraction. Continue heart failure regimen but decrease Lasix to 20 mg by mouth daily and increase spironolactone to 25 mg by mouth daily in light of relatively low potassiums. We'll check an electrolyte panel/BMET in 2 weeks. Otherwise we'll followup the patient in one year. We'll repeat a bedside echocardiogram at that point in time, but unless there is change in her clinical symptoms the patient does not warrant repeat followup complete echocardiograms.

## 2010-10-01 NOTE — Patient Instructions (Signed)
   Decrease Lasix to 20mg  daily  Increase Spironolactone to 25mg  daily  Stop Aspirin Your physician recommends that you go to the [facility] for lab work in 2 weeks for [employer]. If the results of your test are normal or stable, you will receive a letter.  If they are abnormal, the nurse will contact you by phone. Your physician wants you to follow up in:  1 year.  You will receive a reminder letter in the mail one-two months in advance.  If you don't receive a letter, please call our office to schedule the follow up appointment

## 2010-10-01 NOTE — Assessment & Plan Note (Signed)
History of CVA felt to be cardioembolic in nature. Continue Coumadin. Aspirin has been discontinued.

## 2010-10-01 NOTE — Progress Notes (Signed)
HPI The patient is a 60 year old female with a history of nonischemic cardiomyopathy, prior ejection fraction of 20-25%, improved to 45-50% on a recent echocardiogram in February 2012. The patient remains in NYHA class I. The patient does have a family history of dilated cardiomyopathy in addition to prior therapy with Adriamycin. She has been evaluated with Cardiolite stress study which was within normal limits. Aspirin has been discontinued in the meanwhile. Laboratory work in February demonstrated slightly low potassium of 3.4 and a BNP level of 23. The patient has remained on Coumadin because of its higher right middle cerebral artery cardioembolic infarct in January of 2010. She states she is doing fairly well. She has had no defibrillator discharges and her defibrillator has been recently interrogated. She denies any orthopnea PND palpitations or syncope. Bedside echocardiogram: Normal LV function ejection fraction 55%. Normal RV function. Normal left atrial and right atrial size. Normal mitral valve and aortic valve.  No Known Allergies  Current Outpatient Prescriptions on File Prior to Visit  Medication Sig Dispense Refill  . digoxin (LANOXIN) 0.125 MG tablet Take 1 tablet (125 mcg total) by mouth daily.  90 tablet  3  . lisinopril (PRINIVIL,ZESTRIL) 10 MG tablet Take 1 tablet (10 mg total) by mouth daily.  90 tablet  3  . metoprolol (TOPROL-XL) 50 MG 24 hr tablet Take 1 tablet (50 mg total) by mouth daily.  90 tablet  3  . nitrofurantoin (MACRODANTIN) 50 MG capsule Take 50 mg by mouth at bedtime.        Marland Kitchen warfarin (COUMADIN) 5 MG tablet Take 1 tablet (5 mg total) by mouth as directed. Per Coumadin clinic  90 tablet  3    Past Medical History  Diagnosis Date  . Breast cancer     hx  . Cerebrovascular disease     s/p right MCA cardioemoblic infarct 10/24/76  . Nonischemic cardiomyopathy   . Automatic implantable cardiac defibrillator in situ     [employer]  . Edema   . CHF (congestive  heart failure)     Past Surgical History  Procedure Date  . Cardiac defibrillator placement     [employer] remote    Family History  Problem Relation Age of Onset  . Cardiomyopathy      family hx of  . Other Brother     s/p heart transplant  . Stroke      family hx of    History   Social History  . Marital Status: Married    Spouse Name: N/A    Number of Children: N/A  . Years of Education: N/A   Occupational History  . Not on file.   Social History Main Topics  . Smoking status: Never Smoker   . Smokeless tobacco: Never Used  . Alcohol Use: No  . Drug Use: No  . Sexually Active: Not on file   Other Topics Concern  . Not on file   Social History Narrative  . No narrative on file   GNF:[id number] positives as outlined above. The remainder of the 18  point review of systems is negative  PHYSICAL EXAM BP 93/57  Pulse 73  Ht 5\' 8"  (1.727 m)  Wt 156 lb (70.761 kg)  BMI 23.72 kg/m2  General: Well-developed, well-nourished in no distress Head: Normocephalic and atraumatic Eyes:PERRLA/EOMI intact, conjunctiva and lids normal Ears: No deformity or lesions Mouth:normal dentition, normal posterior pharynx Neck: Supple, no JVD.  No masses, thyromegaly or abnormal cervical nodes Lungs: Normal breath sounds  bilaterally without wheezing.  Normal percussion Cardiac: regular rate and rhythm with normal S1 and S2, no S3 or S4.  PMI is normal.  No pathological murmurs Abdomen: Normal bowel sounds, abdomen is soft and nontender without masses, organomegaly or hernias noted.  No hepatosplenomegaly MSK: Back normal, normal gait muscle strength and tone normal Vascular: Pulse is normal in all 4 extremities Extremities: No peripheral pitting edema Neurologic: Alert and oriented x 3 Skin: Intact without lesions or rashes Lymphatics: No significant adenopathy Psychologic: Normal affect   ECG:na  Bedside echocardiogram: Normal LV function ejection fraction 55%. Normal RV  function. Normal left atrial and right atrial size. Normal mitral valve and aortic valve.  ASSESSMENT AND PLAN

## 2010-10-28 IMAGING — CT CT ANGIO HEAD
1 of 11 series · 1 of 33 positions shown · IV contrast ([ID] OMNI 350)
Comparison: MRA head and neck 03/09/2008

CTA HEAD

CLINICAL DATA: Headache.  Rule out aneurysm.  Breast cancer.

CT ANGIOGRAPHY HEAD AND NECK
TECHNIQUE: Multidetector CT imaging of the head and neck was
performed using the standard protocol during bolus administration
of intravenous contrast. Multiplanar CT image reconstructions
including MIPs were obtained to evaluate the vascular anatomy.
Carotid stenosis measurements (when applicable) are obtained
utilizing NASCET criteria, using the distal internal carotid
diameter as the denominator.
Contrast: 100 ml Omnipaque 350 IV

[Series 2: head w/(date) · axial · 0.43mm/px · 1 of 28 slices shown]
[im 1/28  soft-tissue]
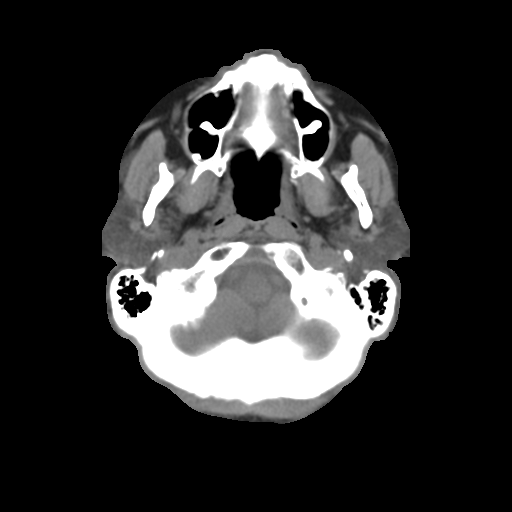

[1 of 33 positions shown; findings below may reference images not displayed]

FINDINGS: Small focal area of encephalomalacia in the right
posterior temporal parietal lobe compatible with chronic ischemia.
Mild chronic ischemia in the white matter.  There is no acute
infarct.  There is no hemorrhage or mass lesion.

Both vertebral arteries are patent and the basilar is patent.  The
posterior cerebral arteries are patent bilaterally.  Tiny posterior
communicating arteries bilaterally.

The internal carotid artery is patent bilaterally.  The prior MRI
questioned a small aneurysm in the left superior hypophyseal
region.  There is slight irregularity of the internal carotid in
this area but I do not see an aneurysm.  The anterior and middle
cerebral arteries are patent bilaterally without significant
stenosis.

Negative for cerebral aneurysm.

 Review of the MIP images confirms the above findings.
IMPRESSION: No significant abnormality.  Slight irregularity the cavernous
carotid may be early atherosclerotic disease without stenosis.  No
aneurysm is detected.

CTA NECK
FINDINGS: The right common carotid artery is widely patent without
stenosis or atherosclerotic disease.  The right carotid bifurcation
is patent without stenosis.  The right internal carotid artery is
patent to the skull base.

The left common carotid artery is widely patent without stenosis.
The left carotid bifurcation and left internal carotid artery are
patent without stenosis or atherosclerotic disease.

Both vertebral arteries are patent to the basilar without stenosis.

There are multiple small cervical lymph nodes bilaterally which are
not pathologic.  No mass lesion is present.

 Review of the MIP images confirms the above findings.
IMPRESSION: No significant carotid or vertebral atherosclerotic disease or
stenosis.

## 2010-10-29 ENCOUNTER — Ambulatory Visit (INDEPENDENT_AMBULATORY_CARE_PROVIDER_SITE_OTHER): Payer: BC Managed Care – PPO | Admitting: *Deleted

## 2010-10-29 DIAGNOSIS — I6350 Cerebral infarction due to unspecified occlusion or stenosis of unspecified cerebral artery: Secondary | ICD-10-CM

## 2010-10-29 DIAGNOSIS — Z7901 Long term (current) use of anticoagulants: Secondary | ICD-10-CM

## 2010-11-15 ENCOUNTER — Ambulatory Visit (INDEPENDENT_AMBULATORY_CARE_PROVIDER_SITE_OTHER): Payer: BC Managed Care – PPO | Admitting: *Deleted

## 2010-11-15 DIAGNOSIS — I6350 Cerebral infarction due to unspecified occlusion or stenosis of unspecified cerebral artery: Secondary | ICD-10-CM

## 2010-11-15 DIAGNOSIS — Z7901 Long term (current) use of anticoagulants: Secondary | ICD-10-CM

## 2010-11-28 ENCOUNTER — Encounter: Payer: BC Managed Care – PPO | Admitting: *Deleted

## 2010-12-02 ENCOUNTER — Encounter: Payer: Self-pay | Admitting: *Deleted

## 2010-12-06 ENCOUNTER — Ambulatory Visit (INDEPENDENT_AMBULATORY_CARE_PROVIDER_SITE_OTHER): Payer: BC Managed Care – PPO | Admitting: *Deleted

## 2010-12-06 ENCOUNTER — Encounter: Payer: BC Managed Care – PPO | Admitting: *Deleted

## 2010-12-06 ENCOUNTER — Other Ambulatory Visit: Payer: Self-pay | Admitting: Internal Medicine

## 2010-12-06 ENCOUNTER — Encounter: Payer: Self-pay | Admitting: Internal Medicine

## 2010-12-06 DIAGNOSIS — I6350 Cerebral infarction due to unspecified occlusion or stenosis of unspecified cerebral artery: Secondary | ICD-10-CM

## 2010-12-06 DIAGNOSIS — Z7901 Long term (current) use of anticoagulants: Secondary | ICD-10-CM

## 2010-12-06 DIAGNOSIS — I428 Other cardiomyopathies: Secondary | ICD-10-CM

## 2010-12-10 LAB — ICD DEVICE OBSERVATION
AL AMPLITUDE: 1.9 mv
BAMS-0001: 180 {beats}/min
DEV-0020ICD: NEGATIVE
DEVICE MODEL ICD: 468775
FVT: 0
MODE SWITCH EPISODES: 0
PACEART VT: 0
RV LEAD AMPLITUDE: 10.8 mv
RV LEAD IMPEDENCE ICD: 437.5 Ohm
TOT-0008: 0
TZAT-0004FASTVT: 8
TZAT-0018FASTVT: NEGATIVE
TZON-0003FASTVT: 280 ms
TZON-0010FASTVT: 80 ms
TZON-0010SLOWVT: 80 ms
TZST-0001FASTVT: 3
TZST-0001SLOWVT: 2
TZST-0001SLOWVT: 5
TZST-0003FASTVT: 36 J
TZST-0003FASTVT: 36 J
TZST-0003SLOWVT: 36 J
VENTRICULAR PACING ICD: 9.1 pct
VF: 1

## 2010-12-12 NOTE — Progress Notes (Signed)
ICD remote 

## 2010-12-20 ENCOUNTER — Other Ambulatory Visit: Payer: Self-pay | Admitting: *Deleted

## 2010-12-20 MED ORDER — SPIRONOLACTONE 25 MG PO TABS: 25.0000 mg | ORAL_TABLET | Freq: Every day | ORAL | Status: DC

## 2010-12-31 ENCOUNTER — Encounter: Payer: Self-pay | Admitting: *Deleted

## 2011-01-07 ENCOUNTER — Ambulatory Visit (INDEPENDENT_AMBULATORY_CARE_PROVIDER_SITE_OTHER): Payer: BC Managed Care – PPO | Admitting: *Deleted

## 2011-01-07 DIAGNOSIS — Z7901 Long term (current) use of anticoagulants: Secondary | ICD-10-CM

## 2011-01-07 DIAGNOSIS — I6350 Cerebral infarction due to unspecified occlusion or stenosis of unspecified cerebral artery: Secondary | ICD-10-CM

## 2011-01-07 LAB — POCT INR: INR: 2.4

## 2011-02-07 ENCOUNTER — Encounter: Payer: Self-pay | Admitting: *Deleted

## 2011-02-07 ENCOUNTER — Ambulatory Visit (INDEPENDENT_AMBULATORY_CARE_PROVIDER_SITE_OTHER): Payer: BC Managed Care – PPO | Admitting: *Deleted

## 2011-02-07 DIAGNOSIS — Z7901 Long term (current) use of anticoagulants: Secondary | ICD-10-CM

## 2011-02-07 DIAGNOSIS — I6350 Cerebral infarction due to unspecified occlusion or stenosis of unspecified cerebral artery: Secondary | ICD-10-CM

## 2011-03-11 ENCOUNTER — Ambulatory Visit (INDEPENDENT_AMBULATORY_CARE_PROVIDER_SITE_OTHER): Payer: BC Managed Care – PPO | Admitting: *Deleted

## 2011-03-11 DIAGNOSIS — I6350 Cerebral infarction due to unspecified occlusion or stenosis of unspecified cerebral artery: Secondary | ICD-10-CM

## 2011-03-11 DIAGNOSIS — Z7901 Long term (current) use of anticoagulants: Secondary | ICD-10-CM

## 2011-03-13 ENCOUNTER — Ambulatory Visit (INDEPENDENT_AMBULATORY_CARE_PROVIDER_SITE_OTHER): Payer: BC Managed Care – PPO | Admitting: *Deleted

## 2011-03-13 DIAGNOSIS — I428 Other cardiomyopathies: Secondary | ICD-10-CM

## 2011-03-13 DIAGNOSIS — Z95810 Presence of automatic (implantable) cardiac defibrillator: Secondary | ICD-10-CM

## 2011-03-14 ENCOUNTER — Encounter: Payer: Self-pay | Admitting: Internal Medicine

## 2011-03-14 LAB — REMOTE ICD DEVICE
AL AMPLITUDE: 2.3 mv
AL IMPEDENCE ICD: 380 Ohm
ATRIAL PACING ICD: 25 pct
BATTERY VOLTAGE: 2.96 V
DEV-0020ICD: NEGATIVE
MODE SWITCH EPISODES: 2
RV LEAD AMPLITUDE: 11.8 mv
TZAT-0001FASTVT: 1
TZAT-0004FASTVT: 8
TZAT-0013SLOWVT: 3
TZAT-0018SLOWVT: NEGATIVE
TZAT-0020SLOWVT: 1 ms
TZON-0003FASTVT: 280 ms
TZON-0003SLOWVT: 330 ms
TZON-0005SLOWVT: 6
TZON-0010FASTVT: 80 ms
TZON-0010SLOWVT: 80 ms
TZST-0001FASTVT: 2
TZST-0001FASTVT: 3
TZST-0001SLOWVT: 2
TZST-0001SLOWVT: 4
TZST-0003FASTVT: 36 J
TZST-0003FASTVT: 36 J
TZST-0003FASTVT: 36 J
TZST-0003SLOWVT: 30 J
TZST-0003SLOWVT: 36 J
VENTRICULAR PACING ICD: 2.1 pct

## 2011-03-19 NOTE — Progress Notes (Signed)
Remote defib check  

## 2011-04-01 ENCOUNTER — Encounter: Payer: Self-pay | Admitting: *Deleted

## 2011-04-08 ENCOUNTER — Ambulatory Visit (INDEPENDENT_AMBULATORY_CARE_PROVIDER_SITE_OTHER): Payer: BC Managed Care – PPO | Admitting: *Deleted

## 2011-04-08 DIAGNOSIS — I6350 Cerebral infarction due to unspecified occlusion or stenosis of unspecified cerebral artery: Secondary | ICD-10-CM

## 2011-04-08 DIAGNOSIS — Z7901 Long term (current) use of anticoagulants: Secondary | ICD-10-CM

## 2011-04-08 LAB — POCT INR: INR: 3.6

## 2011-04-25 ENCOUNTER — Ambulatory Visit (INDEPENDENT_AMBULATORY_CARE_PROVIDER_SITE_OTHER): Payer: BC Managed Care – PPO | Admitting: *Deleted

## 2011-04-25 DIAGNOSIS — Z7901 Long term (current) use of anticoagulants: Secondary | ICD-10-CM

## 2011-04-25 DIAGNOSIS — I6350 Cerebral infarction due to unspecified occlusion or stenosis of unspecified cerebral artery: Secondary | ICD-10-CM

## 2011-04-25 LAB — POCT INR: INR: 2.8

## 2011-05-20 ENCOUNTER — Ambulatory Visit (INDEPENDENT_AMBULATORY_CARE_PROVIDER_SITE_OTHER): Payer: BC Managed Care – PPO | Admitting: *Deleted

## 2011-05-20 DIAGNOSIS — I6350 Cerebral infarction due to unspecified occlusion or stenosis of unspecified cerebral artery: Secondary | ICD-10-CM

## 2011-05-20 DIAGNOSIS — Z7901 Long term (current) use of anticoagulants: Secondary | ICD-10-CM

## 2011-05-20 LAB — POCT INR: INR: 1.9

## 2011-06-12 ENCOUNTER — Ambulatory Visit (INDEPENDENT_AMBULATORY_CARE_PROVIDER_SITE_OTHER): Payer: BC Managed Care – PPO | Admitting: *Deleted

## 2011-06-12 ENCOUNTER — Encounter: Payer: Self-pay | Admitting: Internal Medicine

## 2011-06-12 DIAGNOSIS — I428 Other cardiomyopathies: Secondary | ICD-10-CM

## 2011-06-13 LAB — REMOTE ICD DEVICE
AL AMPLITUDE: 1.3 mv
DEV-0020ICD: NEGATIVE
DEVICE MODEL ICD: 468775
RV LEAD AMPLITUDE: 11.8 mv
TZAT-0001SLOWVT: 1
TZAT-0004FASTVT: 8
TZAT-0013FASTVT: 1
TZAT-0013SLOWVT: 3
TZAT-0018FASTVT: NEGATIVE
TZAT-0020SLOWVT: 1 ms
TZON-0003FASTVT: 280 ms
TZON-0005SLOWVT: 6
TZON-0010SLOWVT: 80 ms
TZST-0001FASTVT: 3
TZST-0001FASTVT: 5
TZST-0001SLOWVT: 2
TZST-0001SLOWVT: 4
TZST-0001SLOWVT: 5
TZST-0003FASTVT: 36 J
TZST-0003SLOWVT: 25 J
TZST-0003SLOWVT: 36 J

## 2011-06-17 ENCOUNTER — Ambulatory Visit (INDEPENDENT_AMBULATORY_CARE_PROVIDER_SITE_OTHER): Payer: BC Managed Care – PPO | Admitting: *Deleted

## 2011-06-17 DIAGNOSIS — I6350 Cerebral infarction due to unspecified occlusion or stenosis of unspecified cerebral artery: Secondary | ICD-10-CM

## 2011-06-17 DIAGNOSIS — Z7901 Long term (current) use of anticoagulants: Secondary | ICD-10-CM

## 2011-06-17 LAB — POCT INR: INR: 2.7

## 2011-06-20 NOTE — Progress Notes (Signed)
ICD remote 

## 2011-07-02 ENCOUNTER — Encounter: Payer: Self-pay | Admitting: *Deleted

## 2011-07-22 ENCOUNTER — Ambulatory Visit (INDEPENDENT_AMBULATORY_CARE_PROVIDER_SITE_OTHER): Payer: BC Managed Care – PPO | Admitting: *Deleted

## 2011-07-22 DIAGNOSIS — I6350 Cerebral infarction due to unspecified occlusion or stenosis of unspecified cerebral artery: Secondary | ICD-10-CM

## 2011-07-22 DIAGNOSIS — Z7901 Long term (current) use of anticoagulants: Secondary | ICD-10-CM

## 2011-08-19 ENCOUNTER — Ambulatory Visit (INDEPENDENT_AMBULATORY_CARE_PROVIDER_SITE_OTHER): Payer: BC Managed Care – PPO | Admitting: *Deleted

## 2011-08-19 DIAGNOSIS — Z7901 Long term (current) use of anticoagulants: Secondary | ICD-10-CM

## 2011-08-19 DIAGNOSIS — I6350 Cerebral infarction due to unspecified occlusion or stenosis of unspecified cerebral artery: Secondary | ICD-10-CM

## 2011-09-30 ENCOUNTER — Ambulatory Visit (INDEPENDENT_AMBULATORY_CARE_PROVIDER_SITE_OTHER): Payer: BC Managed Care – PPO | Admitting: *Deleted

## 2011-09-30 DIAGNOSIS — Z7901 Long term (current) use of anticoagulants: Secondary | ICD-10-CM

## 2011-09-30 DIAGNOSIS — I6350 Cerebral infarction due to unspecified occlusion or stenosis of unspecified cerebral artery: Secondary | ICD-10-CM

## 2011-10-01 ENCOUNTER — Other Ambulatory Visit: Payer: Self-pay | Admitting: *Deleted

## 2011-10-01 MED ORDER — DIGOXIN 125 MCG PO TABS: 125.0000 ug | ORAL_TABLET | Freq: Every day | ORAL | Status: DC

## 2011-10-03 ENCOUNTER — Other Ambulatory Visit: Payer: Self-pay | Admitting: *Deleted

## 2011-10-03 MED ORDER — LISINOPRIL 10 MG PO TABS: 10.0000 mg | ORAL_TABLET | Freq: Every day | ORAL | Status: DC

## 2011-10-29 ENCOUNTER — Encounter: Payer: Self-pay | Admitting: *Deleted

## 2011-11-11 ENCOUNTER — Encounter: Payer: BC Managed Care – PPO | Admitting: Internal Medicine

## 2011-11-11 ENCOUNTER — Ambulatory Visit (INDEPENDENT_AMBULATORY_CARE_PROVIDER_SITE_OTHER): Payer: BC Managed Care – PPO | Admitting: *Deleted

## 2011-11-11 DIAGNOSIS — I6350 Cerebral infarction due to unspecified occlusion or stenosis of unspecified cerebral artery: Secondary | ICD-10-CM

## 2011-11-11 DIAGNOSIS — Z7901 Long term (current) use of anticoagulants: Secondary | ICD-10-CM

## 2011-11-11 LAB — POCT INR: INR: 2.7

## 2011-11-24 ENCOUNTER — Other Ambulatory Visit: Payer: Self-pay | Admitting: *Deleted

## 2011-11-24 MED ORDER — METOPROLOL SUCCINATE ER 50 MG PO TB24: 50.0000 mg | ORAL_TABLET | Freq: Every day | ORAL | Status: DC

## 2011-11-24 MED ORDER — WARFARIN SODIUM 5 MG PO TABS: 5.0000 mg | ORAL_TABLET | ORAL | Status: DC

## 2011-11-24 MED ORDER — DIGOXIN 125 MCG PO TABS: 125.0000 ug | ORAL_TABLET | Freq: Every day | ORAL | Status: DC

## 2011-11-24 MED ORDER — FUROSEMIDE 40 MG PO TABS: 20.0000 mg | ORAL_TABLET | Freq: Every day | ORAL | Status: DC

## 2011-12-23 ENCOUNTER — Ambulatory Visit (INDEPENDENT_AMBULATORY_CARE_PROVIDER_SITE_OTHER): Payer: BC Managed Care – PPO | Admitting: *Deleted

## 2011-12-23 ENCOUNTER — Ambulatory Visit (INDEPENDENT_AMBULATORY_CARE_PROVIDER_SITE_OTHER): Payer: BC Managed Care – PPO | Admitting: Cardiology

## 2011-12-23 ENCOUNTER — Encounter: Payer: Self-pay | Admitting: Cardiology

## 2011-12-23 VITALS — BP 118/69 | HR 72 | Ht 68.0 in | Wt 161.0 lb

## 2011-12-23 DIAGNOSIS — R0602 Shortness of breath: Secondary | ICD-10-CM

## 2011-12-23 DIAGNOSIS — I509 Heart failure, unspecified: Secondary | ICD-10-CM

## 2011-12-23 DIAGNOSIS — Z7901 Long term (current) use of anticoagulants: Secondary | ICD-10-CM

## 2011-12-23 DIAGNOSIS — I428 Other cardiomyopathies: Secondary | ICD-10-CM

## 2011-12-23 DIAGNOSIS — R609 Edema, unspecified: Secondary | ICD-10-CM

## 2011-12-23 DIAGNOSIS — I6350 Cerebral infarction due to unspecified occlusion or stenosis of unspecified cerebral artery: Secondary | ICD-10-CM

## 2011-12-23 LAB — POCT INR: INR: 3

## 2011-12-23 MED ORDER — DIGOXIN 125 MCG PO TABS: 125.0000 ug | ORAL_TABLET | Freq: Every day | ORAL | Status: DC

## 2011-12-23 MED ORDER — FUROSEMIDE 40 MG PO TABS: 40.0000 mg | ORAL_TABLET | Freq: Every day | ORAL | Status: DC

## 2011-12-23 MED ORDER — SPIRONOLACTONE 25 MG PO TABS: 25.0000 mg | ORAL_TABLET | Freq: Every day | ORAL | Status: DC

## 2011-12-23 MED ORDER — METOPROLOL SUCCINATE ER 50 MG PO TB24: 50.0000 mg | ORAL_TABLET | Freq: Every day | ORAL | Status: DC

## 2011-12-23 MED ORDER — LISINOPRIL 10 MG PO TABS: 10.0000 mg | ORAL_TABLET | Freq: Every day | ORAL | Status: DC

## 2011-12-23 NOTE — Patient Instructions (Addendum)

## 2011-12-23 NOTE — Progress Notes (Signed)
HPI The patient has a history of a nonischemic cardiomyopathy. Her ejection fraction was at one point in time her EF was 20%. In February 2012 it was up to 45-50%. She does have a defibrillator. She gets along well. The patient denies any new symptoms such as chest discomfort, neck or arm discomfort. There has been no new shortness of breath, PND or orthopnea. There have been no reported palpitations, presyncope or syncope. 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 Of note she has a strong family history of dilated cardiomyopathy. Her brother has a heart transplant.  No Known Allergies  Current Outpatient Prescriptions  Medication Sig Dispense Refill  . digoxin (LANOXIN) 0.125 MG tablet Take 1 tablet (125 mcg total) by mouth daily.  90 tablet  0  . furosemide (LASIX) 40 MG tablet Take 40 mg by mouth daily. Dose increase      . lisinopril (PRINIVIL,ZESTRIL) 10 MG tablet Take 1 tablet (10 mg total) by mouth daily.  90 tablet  3  . metoprolol succinate (TOPROL-XL) 50 MG 24 hr tablet Take 1 tablet (50 mg total) by mouth daily.  90 tablet  0  . nitrofurantoin (MACRODANTIN) 50 MG capsule Take 50 mg by mouth at bedtime.        Marland Kitchen spironolactone (ALDACTONE) 25 MG tablet Take 1 tablet (25 mg total) by mouth daily.  90 tablet  3  . warfarin (COUMADIN) 5 MG tablet Take 1 tablet (5 mg total) by mouth as directed. Per Coumadin clinic  90 tablet  0  . [DISCONTINUED] furosemide (LASIX) 40 MG tablet Take 0.5 tablets (20 mg total) by mouth daily. Dose increase  45 tablet  0    Past Medical History  Diagnosis Date  . Breast cancer     hx  . Cerebrovascular disease     s/p right MCA cardioemoblic infarct 4/78/29  . Nonischemic cardiomyopathy   . Automatic implantable cardiac defibrillator in situ     [employer]  . Edema   . CHF (congestive heart failure)     Past Surgical History  Procedure Date  . Cardiac defibrillator placement     [employer] remote    ROS:  As stated in the HPI  and negative for all other systems.  PHYSICAL EXAM BP 118/69  Pulse 72  Ht 5\' 8"  (1.727 m)  Wt 161 lb (73.029 kg)  BMI 24.48 kg/m2 GENERAL:  Well appearing HEENT:  Pupils equal round and reactive, fundi not visualized, oral mucosa unremarkable NECK:  No jugular venous distention, waveform within normal limits, carotid upstroke brisk and symmetric, no bruits, no thyromegaly LYMPHATICS:  No cervical, inguinal adenopathy LUNGS:  Clear to auscultation bilaterally BACK:  No CVA tenderness CHEST:  Well healed ICD pocket HEART:  PMI not displaced or sustained,S1 and S2 within normal limits, no S3, no S4, no clicks, no rubs, no murmurs ABD:  Flat, positive bowel sounds normal in frequency in pitch, no bruits, no rebound, no guarding, no midline pulsatile mass, no hepatomegaly, no splenomegaly EXT:  2 plus pulses throughout, no edema, no cyanosis no clubbing SKIN:  No rashes no nodules NEURO:  Cranial nerves II through XII grossly intact, motor grossly intact throughout PSYCH:  Cognitively intact, oriented to person place and time   EKG:  Sinus rhythm with demand atrial pacing, axis within normal limits, intervals within normal limits, no acute ST-T wave changes. 12/23/2011  ASSESSMENT AND PLAN  CARDIOMYOPATHY, PRIMARY, DILATED - She has no symptoms. At  this point I will make no changes to her regimen. I would like to repeat an echocardiogram to make sure there's been no change in her ejection fraction.  Implantable defibrillator -  Follow Dr. Graciela Husbands. 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 Continue Coumadin. Aspirin has been discontinued

## 2012-01-13 ENCOUNTER — Encounter: Payer: BC Managed Care – PPO | Admitting: Internal Medicine

## 2012-01-15 ENCOUNTER — Other Ambulatory Visit (INDEPENDENT_AMBULATORY_CARE_PROVIDER_SITE_OTHER): Payer: BC Managed Care – PPO

## 2012-01-15 DIAGNOSIS — I509 Heart failure, unspecified: Secondary | ICD-10-CM

## 2012-01-15 DIAGNOSIS — I428 Other cardiomyopathies: Secondary | ICD-10-CM

## 2012-01-20 ENCOUNTER — Telehealth: Payer: Self-pay | Admitting: *Deleted

## 2012-01-20 NOTE — Telephone Encounter (Signed)
Message copied by Eustace Moore on Tue Jan 20, 2012 11:23 AM ------      Message from: Rollene Rotunda      Created: Fri Jan 16, 2012  6:11 PM       Her EF is about the same as previous.  No change in therapy or further imaging is indicated. Call Ms. Orvan Falconer with the results and send results to Donzetta Sprung, MD

## 2012-01-20 NOTE — Telephone Encounter (Signed)
Patient informed and copy sent to PCP. 

## 2012-02-10 ENCOUNTER — Ambulatory Visit (INDEPENDENT_AMBULATORY_CARE_PROVIDER_SITE_OTHER): Payer: BC Managed Care – PPO | Admitting: *Deleted

## 2012-02-10 DIAGNOSIS — Z7901 Long term (current) use of anticoagulants: Secondary | ICD-10-CM

## 2012-02-10 DIAGNOSIS — I6350 Cerebral infarction due to unspecified occlusion or stenosis of unspecified cerebral artery: Secondary | ICD-10-CM

## 2012-02-10 LAB — POCT INR: INR: 2.4

## 2012-02-17 ENCOUNTER — Ambulatory Visit (INDEPENDENT_AMBULATORY_CARE_PROVIDER_SITE_OTHER): Payer: BC Managed Care – PPO | Admitting: Internal Medicine

## 2012-02-17 ENCOUNTER — Encounter: Payer: Self-pay | Admitting: Internal Medicine

## 2012-02-17 VITALS — BP 111/69 | HR 79 | Resp 18 | Ht 68.0 in | Wt 163.0 lb

## 2012-02-17 DIAGNOSIS — I428 Other cardiomyopathies: Secondary | ICD-10-CM

## 2012-02-17 DIAGNOSIS — I6350 Cerebral infarction due to unspecified occlusion or stenosis of unspecified cerebral artery: Secondary | ICD-10-CM

## 2012-02-17 DIAGNOSIS — Z95810 Presence of automatic (implantable) cardiac defibrillator: Secondary | ICD-10-CM

## 2012-02-17 DIAGNOSIS — I5022 Chronic systolic (congestive) heart failure: Secondary | ICD-10-CM

## 2012-02-17 LAB — ICD DEVICE OBSERVATION
AL AMPLITUDE: 2 mv
BAMS-0001: 180 {beats}/min
DEV-0020ICD: NEGATIVE
DEVICE MODEL ICD: 468775
FVT: 0
MODE SWITCH EPISODES: 0
RV LEAD AMPLITUDE: 11.8 mv
RV LEAD THRESHOLD: 1 V
TOT-0008: 0
TZAT-0001FASTVT: 1
TZAT-0001SLOWVT: 1
TZAT-0012FASTVT: 200 ms
TZAT-0019SLOWVT: 7.5 V
TZAT-0020FASTVT: 1 ms
TZAT-0020SLOWVT: 1 ms
TZON-0004SLOWVT: 24
TZON-0005FASTVT: 6
TZON-0005SLOWVT: 6
TZON-0010SLOWVT: 80 ms
TZST-0001FASTVT: 5
TZST-0001SLOWVT: 2
TZST-0001SLOWVT: 4
TZST-0003FASTVT: 36 J
TZST-0003FASTVT: 36 J
TZST-0003SLOWVT: 25 J
TZST-0003SLOWVT: 36 J
VENTRICULAR PACING ICD: 7.7 pct
VF: 1

## 2012-02-17 LAB — BASIC METABOLIC PANEL
BUN: 15 mg/dL (ref 6–23)
Creatinine, Ser: 0.8 mg/dL (ref 0.4–1.2)
GFR: 81.07 mL/min (ref >60.00)
Glucose, Bld: 110 mg/dL — ABNORMAL HIGH (ref 70–99)
Potassium: 3.6 mEq/L (ref 3.5–5.1)
Sodium: 138 mEq/L (ref 135–145)

## 2012-02-17 NOTE — Patient Instructions (Signed)
Your physician recommends that you have lab work today: digoxin/ bmp  Your physician wants you to follow-up in: 1 year with Dr. Graciela Husbands. You will receive a reminder letter in the mail two months in advance. If you don't receive a letter, please call our office to schedule the follow-up appointment.  Your physician recommends that you continue on your current medications as directed. Please refer to the Current Medication list given to you today.

## 2012-02-17 NOTE — Assessment & Plan Note (Signed)
The patient's device was interrogated.  The information was reviewed. No changes were made in the programming.    

## 2012-02-17 NOTE — Progress Notes (Signed)
Patient Care Team: Donzetta Sprung, MD as PCP - General   HPI  [name] [name] is a 62 y.o. female Seen in followup for ICD implanted for primary prevention in the setting of nonischemic heart disease  The patient denies chest pain, shortness of breath, nocturnal dyspnea, orthopnea or peripheral edema.  There have been no palpitations, lightheadedness or syncope.   Tolerating meds  Now following with [facility]  Past Medical History  Diagnosis Date  . Breast cancer     hx  . Cerebrovascular disease     s/p right MCA cardioemoblic infarct 4/54/09  . Nonischemic cardiomyopathy   . Automatic implantable cardiac defibrillator in situ     [employer]  . Edema   . CHF (congestive heart failure)     Past Surgical History  Procedure Date  . Cardiac defibrillator placement     [employer] remote    Current Outpatient Prescriptions  Medication Sig Dispense Refill  . digoxin (LANOXIN) 0.125 MG tablet Take 1 tablet (125 mcg total) by mouth daily.  90 tablet  3  . furosemide (LASIX) 40 MG tablet Take by mouth as needed. Dose increase      . lisinopril (PRINIVIL,ZESTRIL) 10 MG tablet Take 1 tablet (10 mg total) by mouth daily.  90 tablet  3  . metoprolol succinate (TOPROL-XL) 50 MG 24 hr tablet Take 1 tablet (50 mg total) by mouth daily.  90 tablet  3  . nitrofurantoin (MACRODANTIN) 50 MG capsule Take 50 mg by mouth at bedtime.        Marland Kitchen spironolactone (ALDACTONE) 25 MG tablet Take 1 tablet (25 mg total) by mouth daily.  90 tablet  3  . warfarin (COUMADIN) 5 MG tablet Take 1 tablet (5 mg total) by mouth as directed. Per Coumadin clinic  90 tablet  0    No Known Allergies  Review of Systems negative except from HPI and PMH  Physical Exam BP 111/69  Pulse 79  Resp 18  Ht 5\' 8"  (1.727 m)  Wt 163 lb (73.936 kg)  BMI 24.78 kg/m2  SpO2 96% Well developed and well nourished in no acute distress HENT normal E scleral and icterus clear Neck Supple JVP flat; carotids brisk and full Clear to  ausculation Device pocket well healed; without hematoma or erythema Regular rate and rhythm, no murmurs gallops or rub Soft with active bowel sounds No clubbing cyanosis none Edema Alert and oriented, grossly normal motor and sensory function Skin Warm and Dry    Assessment and  Plan

## 2012-02-17 NOTE — Assessment & Plan Note (Signed)
This occurred in the setting of a apical thrombus. There are no data currently that I know of that would justify the use of a NOAC in this situation. I think it is tempting to switch, however, I think we should stay with what has been proven for right now

## 2012-02-17 NOTE — Assessment & Plan Note (Signed)
Stable class 2 symptoms;  echo 12/13-EF 35-40% to continue her on her medications

## 2012-02-17 NOTE — Assessment & Plan Note (Signed)
As above We'll check a metabolic profile and digoxin level given her Aldactone therapy

## 2012-02-18 LAB — DIGOXIN LEVEL: Digoxin Level: 0.6 ng/mL — ABNORMAL LOW (ref 0.8–2.0)

## 2012-03-23 ENCOUNTER — Ambulatory Visit (INDEPENDENT_AMBULATORY_CARE_PROVIDER_SITE_OTHER): Payer: BC Managed Care – PPO | Admitting: *Deleted

## 2012-03-23 DIAGNOSIS — Z7901 Long term (current) use of anticoagulants: Secondary | ICD-10-CM

## 2012-03-23 DIAGNOSIS — I6350 Cerebral infarction due to unspecified occlusion or stenosis of unspecified cerebral artery: Secondary | ICD-10-CM

## 2012-03-23 LAB — POCT INR: INR: 2.7

## 2012-05-04 ENCOUNTER — Ambulatory Visit (INDEPENDENT_AMBULATORY_CARE_PROVIDER_SITE_OTHER): Payer: BC Managed Care – PPO | Admitting: *Deleted

## 2012-05-04 DIAGNOSIS — Z7901 Long term (current) use of anticoagulants: Secondary | ICD-10-CM

## 2012-05-04 DIAGNOSIS — I6350 Cerebral infarction due to unspecified occlusion or stenosis of unspecified cerebral artery: Secondary | ICD-10-CM

## 2012-05-04 MED ORDER — DIGOXIN 125 MCG PO TABS: 125.0000 ug | ORAL_TABLET | Freq: Every day | ORAL | Status: DC

## 2012-05-04 MED ORDER — WARFARIN SODIUM 5 MG PO TABS: 5.0000 mg | ORAL_TABLET | ORAL | Status: DC

## 2012-05-17 ENCOUNTER — Encounter: Payer: BC Managed Care – PPO | Admitting: *Deleted

## 2012-05-19 ENCOUNTER — Encounter: Payer: Self-pay | Admitting: *Deleted

## 2012-05-24 ENCOUNTER — Ambulatory Visit (INDEPENDENT_AMBULATORY_CARE_PROVIDER_SITE_OTHER): Payer: BC Managed Care – PPO | Admitting: *Deleted

## 2012-05-24 ENCOUNTER — Encounter: Payer: Self-pay | Admitting: Internal Medicine

## 2012-05-24 ENCOUNTER — Other Ambulatory Visit: Payer: Self-pay

## 2012-05-24 DIAGNOSIS — Z95810 Presence of automatic (implantable) cardiac defibrillator: Secondary | ICD-10-CM

## 2012-05-24 DIAGNOSIS — I428 Other cardiomyopathies: Secondary | ICD-10-CM

## 2012-05-24 LAB — REMOTE ICD DEVICE
AL AMPLITUDE: 1.5 mv
ATRIAL PACING ICD: 29 pct
BAMS-0001: 180 {beats}/min
DEVICE MODEL ICD: 468775
HV IMPEDENCE: 64 Ohm
RV LEAD IMPEDENCE ICD: 560 Ohm
TZAT-0001SLOWVT: 1
TZAT-0004SLOWVT: 8
TZAT-0012FASTVT: 200 ms
TZAT-0012SLOWVT: 200 ms
TZAT-0013FASTVT: 1
TZAT-0018FASTVT: NEGATIVE
TZAT-0020SLOWVT: 1 ms
TZON-0003FASTVT: 280 ms
TZON-0005FASTVT: 6
TZON-0005SLOWVT: 6
TZST-0001FASTVT: 3
TZST-0001FASTVT: 5
TZST-0001SLOWVT: 4
TZST-0001SLOWVT: 5
TZST-0003FASTVT: 36 J
TZST-0003FASTVT: 36 J
TZST-0003SLOWVT: 25 J

## 2012-06-22 ENCOUNTER — Ambulatory Visit (INDEPENDENT_AMBULATORY_CARE_PROVIDER_SITE_OTHER): Payer: BC Managed Care – PPO | Admitting: *Deleted

## 2012-06-22 DIAGNOSIS — I6350 Cerebral infarction due to unspecified occlusion or stenosis of unspecified cerebral artery: Secondary | ICD-10-CM

## 2012-06-22 DIAGNOSIS — Z7901 Long term (current) use of anticoagulants: Secondary | ICD-10-CM

## 2012-06-22 LAB — POCT INR: INR: 2.4

## 2012-08-03 ENCOUNTER — Ambulatory Visit (INDEPENDENT_AMBULATORY_CARE_PROVIDER_SITE_OTHER): Payer: BC Managed Care – PPO | Admitting: *Deleted

## 2012-08-03 DIAGNOSIS — Z7901 Long term (current) use of anticoagulants: Secondary | ICD-10-CM

## 2012-08-03 DIAGNOSIS — I6350 Cerebral infarction due to unspecified occlusion or stenosis of unspecified cerebral artery: Secondary | ICD-10-CM

## 2012-08-23 ENCOUNTER — Encounter: Payer: BC Managed Care – PPO | Admitting: *Deleted

## 2012-08-24 ENCOUNTER — Encounter: Payer: Self-pay | Admitting: *Deleted

## 2012-09-01 ENCOUNTER — Ambulatory Visit (INDEPENDENT_AMBULATORY_CARE_PROVIDER_SITE_OTHER): Payer: BC Managed Care – PPO | Admitting: *Deleted

## 2012-09-01 DIAGNOSIS — Z95810 Presence of automatic (implantable) cardiac defibrillator: Secondary | ICD-10-CM

## 2012-09-01 DIAGNOSIS — I5022 Chronic systolic (congestive) heart failure: Secondary | ICD-10-CM

## 2012-09-02 LAB — REMOTE ICD DEVICE
AL AMPLITUDE: 1.2 mv
BAMS-0001: 180 {beats}/min
DEV-0020ICD: NEGATIVE
HV IMPEDENCE: 63 Ohm
RV LEAD AMPLITUDE: 11.8 mv
TZAT-0001SLOWVT: 1
TZAT-0004FASTVT: 8
TZAT-0012FASTVT: 200 ms
TZAT-0013FASTVT: 1
TZAT-0013SLOWVT: 3
TZAT-0018FASTVT: NEGATIVE
TZON-0003FASTVT: 280 ms
TZST-0001FASTVT: 3
TZST-0001FASTVT: 5
TZST-0001SLOWVT: 2
TZST-0001SLOWVT: 4
TZST-0001SLOWVT: 5
TZST-0003FASTVT: 36 J
TZST-0003FASTVT: 36 J
TZST-0003SLOWVT: 25 J
TZST-0003SLOWVT: 30 J
TZST-0003SLOWVT: 36 J

## 2012-09-14 ENCOUNTER — Ambulatory Visit (INDEPENDENT_AMBULATORY_CARE_PROVIDER_SITE_OTHER): Payer: BC Managed Care – PPO | Admitting: *Deleted

## 2012-09-14 DIAGNOSIS — Z7901 Long term (current) use of anticoagulants: Secondary | ICD-10-CM

## 2012-09-14 DIAGNOSIS — I6350 Cerebral infarction due to unspecified occlusion or stenosis of unspecified cerebral artery: Secondary | ICD-10-CM

## 2012-10-12 ENCOUNTER — Ambulatory Visit (INDEPENDENT_AMBULATORY_CARE_PROVIDER_SITE_OTHER): Payer: BC Managed Care – PPO | Admitting: *Deleted

## 2012-10-12 DIAGNOSIS — Z7901 Long term (current) use of anticoagulants: Secondary | ICD-10-CM

## 2012-10-12 DIAGNOSIS — I6350 Cerebral infarction due to unspecified occlusion or stenosis of unspecified cerebral artery: Secondary | ICD-10-CM

## 2012-10-26 ENCOUNTER — Ambulatory Visit: Payer: BC Managed Care – PPO | Admitting: Cardiology

## 2012-10-29 ENCOUNTER — Encounter: Payer: Self-pay | Admitting: *Deleted

## 2012-11-09 ENCOUNTER — Encounter: Payer: Self-pay | Admitting: Internal Medicine

## 2012-11-09 ENCOUNTER — Ambulatory Visit (INDEPENDENT_AMBULATORY_CARE_PROVIDER_SITE_OTHER): Payer: BC Managed Care – PPO | Admitting: *Deleted

## 2012-11-09 DIAGNOSIS — Z7901 Long term (current) use of anticoagulants: Secondary | ICD-10-CM

## 2012-11-09 DIAGNOSIS — I6350 Cerebral infarction due to unspecified occlusion or stenosis of unspecified cerebral artery: Secondary | ICD-10-CM

## 2012-11-09 LAB — POCT INR: INR: 3.4

## 2012-12-06 ENCOUNTER — Encounter: Payer: Self-pay | Admitting: Internal Medicine

## 2012-12-06 ENCOUNTER — Ambulatory Visit (INDEPENDENT_AMBULATORY_CARE_PROVIDER_SITE_OTHER): Payer: BC Managed Care – PPO | Admitting: *Deleted

## 2012-12-06 DIAGNOSIS — I428 Other cardiomyopathies: Secondary | ICD-10-CM

## 2012-12-06 LAB — REMOTE ICD DEVICE
AL AMPLITUDE: 2 mv
ATRIAL PACING ICD: 27 pct
BAMS-0001: 180 {beats}/min
BATTERY VOLTAGE: 2.57 V
DEV-0020ICD: NEGATIVE
DEVICE MODEL ICD: 468775
RV LEAD AMPLITUDE: 11.8 mv
TZAT-0004SLOWVT: 8
TZAT-0012SLOWVT: 200 ms
TZAT-0013FASTVT: 1
TZAT-0013SLOWVT: 3
TZAT-0018FASTVT: NEGATIVE
TZAT-0020FASTVT: 1 ms
TZON-0004FASTVT: 12
TZON-0005SLOWVT: 6
TZON-0010FASTVT: 80 ms
TZST-0001FASTVT: 2
TZST-0001SLOWVT: 3
TZST-0001SLOWVT: 5
TZST-0003FASTVT: 36 J
TZST-0003FASTVT: 36 J
TZST-0003FASTVT: 36 J
TZST-0003SLOWVT: 30 J
VENTRICULAR PACING ICD: 9.3 pct

## 2012-12-07 ENCOUNTER — Ambulatory Visit (INDEPENDENT_AMBULATORY_CARE_PROVIDER_SITE_OTHER): Payer: BC Managed Care – PPO | Admitting: *Deleted

## 2012-12-07 DIAGNOSIS — I6350 Cerebral infarction due to unspecified occlusion or stenosis of unspecified cerebral artery: Secondary | ICD-10-CM

## 2012-12-07 DIAGNOSIS — Z7901 Long term (current) use of anticoagulants: Secondary | ICD-10-CM

## 2012-12-14 ENCOUNTER — Encounter: Payer: Self-pay | Admitting: Cardiology

## 2012-12-14 ENCOUNTER — Ambulatory Visit (INDEPENDENT_AMBULATORY_CARE_PROVIDER_SITE_OTHER): Payer: BC Managed Care – PPO | Admitting: Cardiology

## 2012-12-14 ENCOUNTER — Encounter (INDEPENDENT_AMBULATORY_CARE_PROVIDER_SITE_OTHER): Payer: Self-pay

## 2012-12-14 VITALS — BP 118/70 | HR 62 | Ht 68.0 in | Wt 166.8 lb

## 2012-12-14 DIAGNOSIS — I5022 Chronic systolic (congestive) heart failure: Secondary | ICD-10-CM

## 2012-12-14 DIAGNOSIS — I428 Other cardiomyopathies: Secondary | ICD-10-CM

## 2012-12-14 MED ORDER — SPIRONOLACTONE 25 MG PO TABS: 12.5000 mg | ORAL_TABLET | Freq: Every day | ORAL | Status: DC

## 2012-12-14 NOTE — Progress Notes (Signed)
.  HPI The patient has a history of a nonischemic cardiomyopathy. Her ejection fraction was at one point in time her EF was 20%. The most recent ejection fraction was about 40% in 2013.. She gets along well. The patient denies any new symptoms such as chest discomfort, neck or arm discomfort. There has been no new PND or orthopnea. There have been no reported palpitations, presyncope or syncope. 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 She did have some lightheadedness and had her dose of spironolactone reduced.  She has had some mild increased dyspnea with significant exertion since then.  Of note she has a strong family history of dilated cardiomyopathy. Her brother has a heart transplant.  No Known Allergies  Current Outpatient Prescriptions  Medication Sig Dispense Refill  . digoxin (LANOXIN) 0.125 MG tablet Take 1 tablet (125 mcg total) by mouth daily.  90 tablet  3  . furosemide (LASIX) 40 MG tablet Take by mouth as needed. Dose increase      . lisinopril (PRINIVIL,ZESTRIL) 10 MG tablet Take 1 tablet (10 mg total) by mouth daily.  90 tablet  3  . metoprolol succinate (TOPROL-XL) 50 MG 24 hr tablet Take 1 tablet (50 mg total) by mouth daily.  90 tablet  3  . nitrofurantoin (MACRODANTIN) 50 MG capsule Take 50 mg by mouth every other day.       . spironolactone (ALDACTONE) 25 MG tablet Take 1 tablet (25 mg total) by mouth daily.  90 tablet  3  . warfarin (COUMADIN) 5 MG tablet Take 1 tablet (5 mg total) by mouth as directed. Per Coumadin clinic  90 tablet  3   No current facility-administered medications for this visit.    Past Medical History  Diagnosis Date  . Breast cancer     hx  . Cerebrovascular disease     s/p right MCA cardioemoblic infarct 8/46/96  . Nonischemic cardiomyopathy   . Automatic implantable cardiac defibrillator in situ     [employer]  . Edema   . CHF (congestive heart failure)     Past Surgical History  Procedure  Laterality Date  . Cardiac defibrillator placement      [employer] remote    ROS:  As stated in the HPI and negative for all other systems.  PHYSICAL EXAM BP 118/70  Pulse 62  Ht 5\' 8"  (1.727 m)  Wt 166 lb 12.8 oz (75.66 kg)  BMI 25.37 kg/m2 GENERAL:  Well appearing HEENT:  Pupils equal round and reactive, fundi not visualized, oral mucosa unremarkable NECK:  No jugular venous distention, waveform within normal limits, carotid upstroke brisk and symmetric, no bruits, no thyromegaly LUNGS:  Clear to auscultation bilaterally BACK:  No CVA tenderness CHEST:  Well healed ICD pocket HEART:  PMI not displaced or sustained,S1 and S2 within normal limits, no S3, no S4, no clicks, no rubs, no murmurs ABD:  Flat, positive bowel sounds normal in frequency in pitch, no bruits, no rebound, no guarding, no midline pulsatile mass, no hepatomegaly, no splenomegaly EXT:  2 plus pulses throughout, no edema, no cyanosis no clubbing  EKG:  Atrial pace rhythm rate 61,  axis within normal limits, intervals within normal limits, no acute ST-T wave changes. 12/14/2012  ASSESSMENT AND PLAN  CARDIOMYOPATHY, PRIMARY, DILATED - She does have some mild increased swelling she thinks that happens when her spironolactone, was reduced.  However, this has been mild. It was reduced because of lightheadedness. I  will keep her at the lower dose.  Implantable defibrillator -  She follows in our defibrillator clinic.   CVA -  History of CVA felt to be cardioembolic in nature. Continue Coumadin. Aspirin has been discontinued

## 2012-12-14 NOTE — Patient Instructions (Signed)
The current medical regimen is effective;  continue present plan and medications.  Follow up in 1 year with Dr Hochrein.  You will receive a letter in the mail 2 months before you are due.  Please call us when you receive this letter to schedule your follow up appointment.  

## 2012-12-16 NOTE — Progress Notes (Signed)
Remote defib received  

## 2012-12-17 ENCOUNTER — Encounter: Payer: Self-pay | Admitting: *Deleted

## 2012-12-31 ENCOUNTER — Ambulatory Visit (INDEPENDENT_AMBULATORY_CARE_PROVIDER_SITE_OTHER): Payer: BC Managed Care – PPO | Admitting: *Deleted

## 2012-12-31 DIAGNOSIS — I6350 Cerebral infarction due to unspecified occlusion or stenosis of unspecified cerebral artery: Secondary | ICD-10-CM

## 2012-12-31 DIAGNOSIS — Z7901 Long term (current) use of anticoagulants: Secondary | ICD-10-CM

## 2012-12-31 LAB — POCT INR: INR: 2.2

## 2013-01-17 ENCOUNTER — Other Ambulatory Visit: Payer: Self-pay

## 2013-01-17 MED ORDER — LISINOPRIL 10 MG PO TABS: 10.0000 mg | ORAL_TABLET | Freq: Every day | ORAL | Status: DC

## 2013-01-17 MED ORDER — METOPROLOL SUCCINATE ER 50 MG PO TB24: 50.0000 mg | ORAL_TABLET | Freq: Every day | ORAL | Status: DC

## 2013-01-17 MED ORDER — FUROSEMIDE 40 MG PO TABS: ORAL_TABLET | ORAL | Status: DC

## 2013-01-19 ENCOUNTER — Other Ambulatory Visit: Payer: Self-pay

## 2013-01-19 MED ORDER — SPIRONOLACTONE 25 MG PO TABS: 12.5000 mg | ORAL_TABLET | Freq: Every day | ORAL | Status: DC

## 2013-02-15 ENCOUNTER — Ambulatory Visit (INDEPENDENT_AMBULATORY_CARE_PROVIDER_SITE_OTHER): Payer: BC Managed Care – PPO | Admitting: *Deleted

## 2013-02-15 DIAGNOSIS — Z7901 Long term (current) use of anticoagulants: Secondary | ICD-10-CM

## 2013-02-15 DIAGNOSIS — I6350 Cerebral infarction due to unspecified occlusion or stenosis of unspecified cerebral artery: Secondary | ICD-10-CM

## 2013-02-15 LAB — POCT INR: INR: 2.8

## 2013-02-22 ENCOUNTER — Encounter: Payer: Self-pay | Admitting: *Deleted

## 2013-04-01 ENCOUNTER — Ambulatory Visit (INDEPENDENT_AMBULATORY_CARE_PROVIDER_SITE_OTHER): Payer: BC Managed Care – PPO | Admitting: *Deleted

## 2013-04-01 DIAGNOSIS — Z7901 Long term (current) use of anticoagulants: Secondary | ICD-10-CM

## 2013-04-01 DIAGNOSIS — Z5181 Encounter for therapeutic drug level monitoring: Secondary | ICD-10-CM

## 2013-04-01 DIAGNOSIS — I6350 Cerebral infarction due to unspecified occlusion or stenosis of unspecified cerebral artery: Secondary | ICD-10-CM

## 2013-04-01 LAB — POCT INR: INR: 1.8

## 2013-04-12 ENCOUNTER — Encounter: Payer: Self-pay | Admitting: Internal Medicine

## 2013-04-12 ENCOUNTER — Ambulatory Visit (INDEPENDENT_AMBULATORY_CARE_PROVIDER_SITE_OTHER): Payer: BC Managed Care – PPO | Admitting: Internal Medicine

## 2013-04-12 VITALS — BP 110/64 | HR 72 | Ht 68.0 in | Wt 168.5 lb

## 2013-04-12 DIAGNOSIS — Z95810 Presence of automatic (implantable) cardiac defibrillator: Secondary | ICD-10-CM

## 2013-04-12 DIAGNOSIS — I5022 Chronic systolic (congestive) heart failure: Secondary | ICD-10-CM

## 2013-04-12 DIAGNOSIS — I428 Other cardiomyopathies: Secondary | ICD-10-CM

## 2013-04-12 LAB — MDC_IDC_ENUM_SESS_TYPE_INCLINIC
Battery Remaining Longevity: 48 mo
Battery Voltage: 2.57 V
Brady Statistic RA Percent Paced: 29 %
Date Time Interrogation Session: 20150303154510
HighPow Impedance: 65.25 Ohm
Implantable Pulse Generator Serial Number: 468775
Lead Channel Impedance Value: 425 Ohm
Lead Channel Impedance Value: 525 Ohm
Lead Channel Pacing Threshold Amplitude: 1 V
Lead Channel Pacing Threshold Pulse Width: 0.5 ms
Lead Channel Sensing Intrinsic Amplitude: 1.9 mV
Lead Channel Setting Pacing Amplitude: 2.5 V
Lead Channel Setting Sensing Sensitivity: 0.3 mV
MDC IDC MSMT LEADCHNL RA PACING THRESHOLD AMPLITUDE: 0.5 V
MDC IDC MSMT LEADCHNL RA PACING THRESHOLD PULSEWIDTH: 0.5 ms
MDC IDC MSMT LEADCHNL RV SENSING INTR AMPL: 11.8 mV
MDC IDC SET LEADCHNL RA PACING AMPLITUDE: 2 V
MDC IDC SET LEADCHNL RV PACING PULSEWIDTH: 0.5 ms
MDC IDC SET ZONE DETECTION INTERVAL: 250 ms
MDC IDC STAT BRADY RV PERCENT PACED: 9.2 %
Zone Setting Detection Interval: 280 ms
Zone Setting Detection Interval: 330 ms

## 2013-04-12 NOTE — Patient Instructions (Addendum)
Your physician recommends that you have lab work today: Digoxin level  Your physician recommends that you continue on your current medications as directed. Please refer to the Current Medication list given to you today.  Remote monitoring is used to monitor your Pacemaker of ICD from home. This monitoring reduces the number of office visits required to check your device to one time per year. It allows [place] to keep an eye on the functioning of your device to ensure it is working properly. You are scheduled for a device check from home on 07/14/13. You may send your transmission at any time that day. If you have a wireless device, the transmission will be sent automatically. After your physician reviews your transmission, you will receive a postcard with your next transmission date.  Your physician wants you to follow-up in: 1 year with Dr. Caryl Comes.  You will receive a reminder letter in the mail two months in advance. If you don't receive a letter, please call our office to schedule the follow-up appointment.

## 2013-04-12 NOTE — Assessment & Plan Note (Addendum)
Euvolemic\ Check dig level

## 2013-04-12 NOTE — Assessment & Plan Note (Signed)
,  The patient's device was interrogated.  The information was reviewed. No changes were made in the programming.    

## 2013-04-12 NOTE — Progress Notes (Signed)
      Patient Care Team: Caryl Bis, MD as PCP - General   HPI  [name] [name] is a 63 y.o. female Seen in followup for ICD implanted for primary prevention in the setting of nonischemic heart disease  The patient denies chest pain, shortness of breath, nocturnal dyspnea, orthopnea  Some peripheral edema occasionallty  Prompting extra lasix. There have been no palpitations, lightheadedness or syncope.  Tolerating meds  Now following with [facility]   Past Medical History  Diagnosis Date  . Breast cancer     hx  . Cerebrovascular disease     s/p right MCA cardioemoblic infarct 3/71/69  . Nonischemic cardiomyopathy   . Automatic implantable cardiac defibrillator in situ     [employer]  . Edema   . CHF (congestive heart failure)     Past Surgical History  Procedure Laterality Date  . Cardiac defibrillator placement      [employer] remote    Current Outpatient Prescriptions  Medication Sig Dispense Refill  . digoxin (LANOXIN) 0.125 MG tablet Take 1 tablet (125 mcg total) by mouth daily.  90 tablet  3  . furosemide (LASIX) 40 MG tablet 40 mg daily. Dose increase      . lisinopril (PRINIVIL,ZESTRIL) 10 MG tablet Take 1 tablet (10 mg total) by mouth daily.  90 tablet  1  . metoprolol succinate (TOPROL-XL) 50 MG 24 hr tablet Take 1 tablet (50 mg total) by mouth daily.  90 tablet  0  . nitrofurantoin (MACRODANTIN) 50 MG capsule Take 50 mg by mouth every other day.       . spironolactone (ALDACTONE) 25 MG tablet Take 0.5 tablets (12.5 mg total) by mouth daily.  90 tablet  3  . warfarin (COUMADIN) 5 MG tablet Take 1 tablet (5 mg total) by mouth as directed. Per Coumadin clinic  90 tablet  3   No current facility-administered medications for this visit.    No Known Allergies  Review of Systems negative except from HPI and PMH  Physical Exam BP 110/64  Pulse 72  Ht 5\' 8"  (1.727 m)  Wt 168 lb 8 oz (76.431 kg)  BMI 25.63 kg/m2 Well developed and well nourished in no acute  distress HENT normal E scleral and icterus clear Neck Supple JVP flat; carotids brisk and full Clear to ausculation Device pocket well healed; without hematoma or erythema.  There is no tethering Regular rate and rhythm, no murmurs gallops or rub Soft with active bowel sounds No clubbing cyanosis none Edema Alert and oriented, grossly normal motor and sensory function Skin Warm and Dry    Assessment and  Plan

## 2013-04-12 NOTE — Assessment & Plan Note (Signed)
Continue c8urrernt meds

## 2013-04-13 LAB — DIGOXIN LEVEL: Digoxin Level: 0.4 ng/mL — ABNORMAL LOW (ref 0.8–2.0)

## 2013-04-22 ENCOUNTER — Other Ambulatory Visit: Payer: Self-pay

## 2013-04-22 ENCOUNTER — Ambulatory Visit (INDEPENDENT_AMBULATORY_CARE_PROVIDER_SITE_OTHER): Payer: BC Managed Care – PPO | Admitting: *Deleted

## 2013-04-22 DIAGNOSIS — Z7901 Long term (current) use of anticoagulants: Secondary | ICD-10-CM

## 2013-04-22 DIAGNOSIS — I6350 Cerebral infarction due to unspecified occlusion or stenosis of unspecified cerebral artery: Secondary | ICD-10-CM

## 2013-04-22 LAB — POCT INR: INR: 1.8

## 2013-04-22 MED ORDER — METOPROLOL SUCCINATE ER 50 MG PO TB24: 50.0000 mg | ORAL_TABLET | Freq: Every day | ORAL | Status: DC

## 2013-04-22 MED ORDER — DIGOXIN 125 MCG PO TABS: 125.0000 ug | ORAL_TABLET | Freq: Every day | ORAL | Status: DC

## 2013-05-06 ENCOUNTER — Ambulatory Visit (INDEPENDENT_AMBULATORY_CARE_PROVIDER_SITE_OTHER): Payer: BC Managed Care – PPO | Admitting: *Deleted

## 2013-05-06 DIAGNOSIS — Z7901 Long term (current) use of anticoagulants: Secondary | ICD-10-CM

## 2013-05-06 DIAGNOSIS — I6350 Cerebral infarction due to unspecified occlusion or stenosis of unspecified cerebral artery: Secondary | ICD-10-CM

## 2013-05-06 LAB — POCT INR: INR: 2.3

## 2013-05-16 ENCOUNTER — Encounter: Payer: Self-pay | Admitting: *Deleted

## 2013-05-19 ENCOUNTER — Telehealth: Payer: Self-pay | Admitting: Internal Medicine

## 2013-05-19 NOTE — Telephone Encounter (Signed)
I attempted to call the patient. I left a message I will call her back on Monday.

## 2013-05-19 NOTE — Telephone Encounter (Signed)
Spoke with pt who states she received letter from Alvis Lemmings regarding genetic testing. She would like to discuss.  Will have Heather contact her.  Pt states best number to reach her is the cell number.

## 2013-05-19 NOTE — Telephone Encounter (Signed)
The patient called back. She is interested in genetic testing. I will have Dr. Caryl Comes sign her paperwork on Monday for genetic testing and I will call her back when this is done. I can then coordinate with her when she can come for testing. She is agreeable.

## 2013-05-19 NOTE — Telephone Encounter (Signed)
New problem   Pt need to speak to nurse concerning having genetic testing. Please call pt.

## 2013-05-26 NOTE — Telephone Encounter (Signed)
I left the patient a message on her cell # that her Gene DX order is almost complete, but I am waiting on clarification as to which panel of test we need to order. I will call her back once completed. I have explained on the message that Dr. Caryl Comes is out of the country now and it may be around 4/28 before I can be back in touch with her. I stated she could call with any questions.

## 2013-06-02 ENCOUNTER — Other Ambulatory Visit: Payer: Self-pay

## 2013-06-02 MED ORDER — LISINOPRIL 10 MG PO TABS: 10.0000 mg | ORAL_TABLET | Freq: Every day | ORAL | Status: DC

## 2013-06-03 ENCOUNTER — Ambulatory Visit (INDEPENDENT_AMBULATORY_CARE_PROVIDER_SITE_OTHER): Payer: BC Managed Care – PPO | Admitting: *Deleted

## 2013-06-03 ENCOUNTER — Other Ambulatory Visit: Payer: Self-pay | Admitting: *Deleted

## 2013-06-03 DIAGNOSIS — I6350 Cerebral infarction due to unspecified occlusion or stenosis of unspecified cerebral artery: Secondary | ICD-10-CM

## 2013-06-03 DIAGNOSIS — Z7901 Long term (current) use of anticoagulants: Secondary | ICD-10-CM

## 2013-06-03 LAB — POCT INR: INR: 2

## 2013-06-03 MED ORDER — WARFARIN SODIUM 5 MG PO TABS: ORAL_TABLET | ORAL | Status: DC

## 2013-06-09 NOTE — Telephone Encounter (Signed)
Paper work complete on our end for Gene DX testing. I have left a message for the patient to call to arrange testing.

## 2013-06-13 NOTE — Telephone Encounter (Signed)
I left a message for the patient to call. 

## 2013-06-20 ENCOUNTER — Telehealth: Payer: Self-pay | Admitting: Internal Medicine

## 2013-06-20 NOTE — Telephone Encounter (Signed)
I spoke with the patient and she is aware her transmission is ok. She will monitor symptoms and call back if they become more persistent for her. She is agreeable.

## 2013-06-20 NOTE — Telephone Encounter (Signed)
Remote transmission received. Reviewed with Erasmo Downer in the device clinic. No arrhythmias noted on transmission. I left a message for the patient to call.

## 2013-06-20 NOTE — Telephone Encounter (Signed)
The patient called this morning to set up a time to come in for genetic testing. She will come this Thursday. She also mentioned that Friday night, she did not feel well and noticed that her HR was up around 90 bpm. Saturday her HR's were improved, but she felt weak all day. She is on warfarin for history of a CVA. She states that she and her husband did go out of town last week to [place] and they were outside quite a bit. She doesn't feel like she overdid any activity. I explained to her she may have gotten a little dehydrated with the warmer weather. She will send a manuel transmission in today for [place] to review. I will be back in touch with her after this is received.

## 2013-06-23 ENCOUNTER — Other Ambulatory Visit: Payer: BC Managed Care – PPO

## 2013-07-05 ENCOUNTER — Ambulatory Visit (INDEPENDENT_AMBULATORY_CARE_PROVIDER_SITE_OTHER): Payer: BC Managed Care – PPO | Admitting: *Deleted

## 2013-07-05 DIAGNOSIS — Z7901 Long term (current) use of anticoagulants: Secondary | ICD-10-CM

## 2013-07-05 DIAGNOSIS — I6350 Cerebral infarction due to unspecified occlusion or stenosis of unspecified cerebral artery: Secondary | ICD-10-CM

## 2013-07-05 LAB — POCT INR: INR: 2.8

## 2013-07-14 ENCOUNTER — Ambulatory Visit (INDEPENDENT_AMBULATORY_CARE_PROVIDER_SITE_OTHER): Payer: BC Managed Care – PPO | Admitting: *Deleted

## 2013-07-14 DIAGNOSIS — I428 Other cardiomyopathies: Secondary | ICD-10-CM

## 2013-07-15 ENCOUNTER — Encounter: Payer: Self-pay | Admitting: Internal Medicine

## 2013-07-15 ENCOUNTER — Telehealth: Payer: Self-pay | Admitting: Cardiology

## 2013-07-15 LAB — MDC_IDC_ENUM_SESS_TYPE_REMOTE
Brady Statistic AP VP Percent: 16 %
Brady Statistic AP VS Percent: 21 %
Brady Statistic AS VP Percent: 1 %
Brady Statistic AS VS Percent: 61 %
Brady Statistic RA Percent Paced: 36 %
Brady Statistic RV Percent Paced: 16 %
Date Time Interrogation Session: 20150605154948
HIGH POWER IMPEDANCE MEASURED VALUE: 55 Ohm
HighPow Impedance: 55 Ohm
Implantable Pulse Generator Serial Number: 468775
Lead Channel Impedance Value: 560 Ohm
Lead Channel Pacing Threshold Amplitude: 1 V
Lead Channel Pacing Threshold Pulse Width: 0.5 ms
Lead Channel Sensing Intrinsic Amplitude: 11.8 mV
Lead Channel Setting Pacing Amplitude: 2 V
Lead Channel Setting Sensing Sensitivity: 0.3 mV
MDC IDC MSMT BATTERY REMAINING LONGEVITY: 46 mo
MDC IDC MSMT BATTERY VOLTAGE: 2.57 V
MDC IDC MSMT LEADCHNL RA IMPEDANCE VALUE: 350 Ohm
MDC IDC MSMT LEADCHNL RA PACING THRESHOLD AMPLITUDE: 0.5 V
MDC IDC MSMT LEADCHNL RA PACING THRESHOLD PULSEWIDTH: 0.5 ms
MDC IDC MSMT LEADCHNL RA SENSING INTR AMPL: 1.3 mV
MDC IDC SET LEADCHNL RV PACING AMPLITUDE: 2.5 V
MDC IDC SET LEADCHNL RV PACING PULSEWIDTH: 0.5 ms
MDC IDC SET ZONE DETECTION INTERVAL: 280 ms
Zone Setting Detection Interval: 250 ms
Zone Setting Detection Interval: 330 ms

## 2013-07-15 NOTE — Telephone Encounter (Signed)
Spoke with pt and reminded pt of remote transmission that is due today. Pt verbalized understanding.   

## 2013-07-15 NOTE — Progress Notes (Signed)
Remote ICD transmission.   

## 2013-07-26 ENCOUNTER — Other Ambulatory Visit: Payer: Self-pay

## 2013-07-26 MED ORDER — FUROSEMIDE 40 MG PO TABS: 40.0000 mg | ORAL_TABLET | Freq: Every day | ORAL | Status: DC

## 2013-08-10 ENCOUNTER — Encounter: Payer: Self-pay | Admitting: Cardiology

## 2013-08-16 ENCOUNTER — Ambulatory Visit (INDEPENDENT_AMBULATORY_CARE_PROVIDER_SITE_OTHER): Payer: BC Managed Care – PPO | Admitting: *Deleted

## 2013-08-16 DIAGNOSIS — Z7901 Long term (current) use of anticoagulants: Secondary | ICD-10-CM

## 2013-08-16 DIAGNOSIS — I6350 Cerebral infarction due to unspecified occlusion or stenosis of unspecified cerebral artery: Secondary | ICD-10-CM

## 2013-08-16 LAB — POCT INR: INR: 2.3

## 2013-09-19 ENCOUNTER — Telehealth: Payer: Self-pay | Admitting: *Deleted

## 2013-09-19 ENCOUNTER — Encounter: Payer: Self-pay | Admitting: *Deleted

## 2013-09-19 NOTE — Telephone Encounter (Signed)
Gene DX report received. I left a message for the patient to call on her cell #. Home # machine is full.

## 2013-09-19 NOTE — Telephone Encounter (Signed)
I spoke with the patient. 

## 2013-09-30 ENCOUNTER — Ambulatory Visit (INDEPENDENT_AMBULATORY_CARE_PROVIDER_SITE_OTHER): Payer: BC Managed Care – PPO | Admitting: *Deleted

## 2013-09-30 DIAGNOSIS — Z7901 Long term (current) use of anticoagulants: Secondary | ICD-10-CM

## 2013-09-30 DIAGNOSIS — I6350 Cerebral infarction due to unspecified occlusion or stenosis of unspecified cerebral artery: Secondary | ICD-10-CM

## 2013-09-30 LAB — POCT INR: INR: 2.5

## 2013-10-24 ENCOUNTER — Other Ambulatory Visit: Payer: Self-pay | Admitting: Cardiology

## 2013-10-25 ENCOUNTER — Other Ambulatory Visit: Payer: Self-pay | Admitting: *Deleted

## 2013-10-25 MED ORDER — WARFARIN SODIUM 5 MG PO TABS: ORAL_TABLET | ORAL | Status: DC

## 2013-11-11 ENCOUNTER — Ambulatory Visit (INDEPENDENT_AMBULATORY_CARE_PROVIDER_SITE_OTHER): Payer: BC Managed Care – PPO | Admitting: *Deleted

## 2013-11-11 DIAGNOSIS — I639 Cerebral infarction, unspecified: Secondary | ICD-10-CM

## 2013-11-11 DIAGNOSIS — I6350 Cerebral infarction due to unspecified occlusion or stenosis of unspecified cerebral artery: Secondary | ICD-10-CM

## 2013-11-11 DIAGNOSIS — Z7901 Long term (current) use of anticoagulants: Secondary | ICD-10-CM

## 2013-11-11 LAB — POCT INR: INR: 3.6

## 2013-11-17 ENCOUNTER — Encounter: Payer: Self-pay | Admitting: Internal Medicine

## 2013-11-17 ENCOUNTER — Ambulatory Visit (INDEPENDENT_AMBULATORY_CARE_PROVIDER_SITE_OTHER): Payer: BC Managed Care – PPO | Admitting: Internal Medicine

## 2013-11-17 VITALS — BP 128/80 | HR 62 | Ht 68.0 in | Wt 158.4 lb

## 2013-11-17 DIAGNOSIS — I428 Other cardiomyopathies: Secondary | ICD-10-CM

## 2013-11-17 DIAGNOSIS — Z95810 Presence of automatic (implantable) cardiac defibrillator: Secondary | ICD-10-CM

## 2013-11-17 DIAGNOSIS — I429 Cardiomyopathy, unspecified: Secondary | ICD-10-CM | POA: Diagnosis not present

## 2013-11-17 DIAGNOSIS — I5022 Chronic systolic (congestive) heart failure: Secondary | ICD-10-CM

## 2013-11-17 DIAGNOSIS — I420 Dilated cardiomyopathy: Secondary | ICD-10-CM

## 2013-11-17 LAB — MDC_IDC_ENUM_SESS_TYPE_INCLINIC
Battery Remaining Longevity: 26.4 mo
Battery Voltage: 2.56 V
Brady Statistic RV Percent Paced: 17 %
HIGH POWER IMPEDANCE MEASURED VALUE: 65.25 Ohm
Implantable Pulse Generator Serial Number: 468775
Lead Channel Impedance Value: 350 Ohm
Lead Channel Impedance Value: 562.5 Ohm
Lead Channel Pacing Threshold Amplitude: 0.5 V
Lead Channel Pacing Threshold Amplitude: 1 V
Lead Channel Pacing Threshold Pulse Width: 0.5 ms
Lead Channel Sensing Intrinsic Amplitude: 1.9 mV
Lead Channel Setting Pacing Amplitude: 2 V
Lead Channel Setting Pacing Amplitude: 2.5 V
Lead Channel Setting Pacing Pulse Width: 0.5 ms
Lead Channel Setting Sensing Sensitivity: 0.3 mV
MDC IDC MSMT LEADCHNL RV PACING THRESHOLD PULSEWIDTH: 0.5 ms
MDC IDC MSMT LEADCHNL RV SENSING INTR AMPL: 11.8 mV
MDC IDC SESS DTM: 20151008103703
MDC IDC SET ZONE DETECTION INTERVAL: 280 ms
MDC IDC STAT BRADY RA PERCENT PACED: 36 %
Zone Setting Detection Interval: 250 ms
Zone Setting Detection Interval: 330 ms

## 2013-11-17 NOTE — Progress Notes (Signed)
      Patient Care Team: Caryl Bis, MD as PCP - General   HPI  [name] [name] is a 63 y.o. female Seen in followup for ICD implanted for primary prevention in the setting of nonischemic heart disease  The patient denies chest pain, shortness of breath, nocturnal dyspnea, orthopnea  Some peripheral edema occasionallty  Prompting extra lasix. There have been no palpitations, lightheadedness or syncope.  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 Breast cancer     hx  . Cerebrovascular disease     s/p right MCA cardioemoblic infarct 7/61/60  . Nonischemic cardiomyopathy   . Automatic implantable cardiac defibrillator in situ     [employer]  . Edema   . CHF (congestive heart failure)     Past Surgical History  Procedure Laterality Date  . Cardiac defibrillator placement      [employer] remote    Current Outpatient Prescriptions  Medication Sig Dispense Refill  . digoxin (LANOXIN) 0.125 MG tablet Take 1 tablet (125 mcg total) by mouth daily.  90 tablet  3  . furosemide (LASIX) 40 MG tablet Take 1 tablet (40 mg total) by mouth daily. Dose increase  90 tablet  1  . lisinopril (PRINIVIL,ZESTRIL) 10 MG tablet TAKE 1 BY MOUTH DAILY  90 tablet  0  . metoprolol succinate (TOPROL-XL) 50 MG 24 hr tablet Take 1 tablet (50 mg total) by mouth daily.  90 tablet  3  . warfarin (COUMADIN) 5 MG tablet 1 tablet daily except 1/2 tablet on Mondays, Wednesdays and Fridays or as directed by coumadin clinic  90 tablet  3   No current facility-administered medications for this visit.    No Known Allergies  Review of Systems negative except from HPI and PMH  Physical Exam BP 128/80  Pulse 62  Ht 5\' 8"  (1.727 m)  Wt 158 lb 6.4 oz (71.85 kg)  BMI 24.09 kg/m2 Well developed and well nourished in no acute distress HENT normal E scleral and icterus clear Neck Supple JVP flat; carotids brisk and full Clear to ausculation Device pocket well healed; without  hematoma or erythema.  There is no tethering Regular rate and rhythm, no murmurs gallops or rub Soft with active bowel sounds No clubbing cyanosis none Edema Alert and oriented, grossly normal motor and sensory function Skin Warm and Dry    Assessment and  Plan  DCM  ICD  Fam hx of + gene mutation  Discussed with her the implications of gene testing on her brothers to confirm the putative gene mutation.   We spent more than 50% of our >25 min visit in face to face counseling regarding the above

## 2013-11-17 NOTE — Patient Instructions (Signed)
Your physician recommends that you continue on your current medications as directed. Please refer to the Current Medication list given to you today.  Lab today: Digoxin level  Remote monitoring is used to monitor your Pacemaker of ICD from home. This monitoring reduces the number of office visits required to check your device to one time per year. It allows [place] to keep an eye on the functioning of your device to ensure it is working properly. You are scheduled for a device check from home on 02/20/14. You may send your transmission at any time that day. If you have a wireless device, the transmission will be sent automatically. After your physician reviews your transmission, you will receive a postcard with your next transmission date.  Your physician wants you to follow-up in: 1 year with Dr. Caryl Comes.  You will receive a reminder letter in the mail two months in advance. If you don't receive a letter, please call our office to schedule the follow-up appointment.

## 2013-11-18 LAB — DIGOXIN LEVEL: DIGOXIN LVL: 0.4 ng/mL — AB (ref 0.8–2.0)

## 2013-11-22 ENCOUNTER — Encounter: Payer: Self-pay | Admitting: Internal Medicine

## 2013-12-08 ENCOUNTER — Ambulatory Visit (INDEPENDENT_AMBULATORY_CARE_PROVIDER_SITE_OTHER): Payer: BC Managed Care – PPO | Admitting: *Deleted

## 2013-12-08 DIAGNOSIS — Z7901 Long term (current) use of anticoagulants: Secondary | ICD-10-CM

## 2013-12-08 DIAGNOSIS — I639 Cerebral infarction, unspecified: Secondary | ICD-10-CM

## 2013-12-08 DIAGNOSIS — I6350 Cerebral infarction due to unspecified occlusion or stenosis of unspecified cerebral artery: Secondary | ICD-10-CM

## 2013-12-08 LAB — POCT INR: INR: 2.4

## 2013-12-14 ENCOUNTER — Other Ambulatory Visit: Payer: Self-pay | Admitting: *Deleted

## 2013-12-14 DIAGNOSIS — I5022 Chronic systolic (congestive) heart failure: Secondary | ICD-10-CM

## 2013-12-14 DIAGNOSIS — I420 Dilated cardiomyopathy: Secondary | ICD-10-CM

## 2013-12-16 ENCOUNTER — Ambulatory Visit (HOSPITAL_COMMUNITY)
Admission: RE | Admit: 2013-12-16 | Discharge: 2013-12-16 | Disposition: A | Discharge status: Home / Self Care | Payer: BC Managed Care – PPO | Source: Ambulatory Visit | Attending: Cardiology | Admitting: Cardiology

## 2013-12-16 ENCOUNTER — Encounter: Payer: Self-pay | Admitting: Cardiology

## 2013-12-16 ENCOUNTER — Ambulatory Visit (INDEPENDENT_AMBULATORY_CARE_PROVIDER_SITE_OTHER): Payer: BC Managed Care – PPO | Admitting: Cardiology

## 2013-12-16 VITALS — BP 122/72 | HR 62 | Ht 67.5 in | Wt 162.7 lb

## 2013-12-16 DIAGNOSIS — I5022 Chronic systolic (congestive) heart failure: Secondary | ICD-10-CM

## 2013-12-16 DIAGNOSIS — I379 Nonrheumatic pulmonary valve disorder, unspecified: Secondary | ICD-10-CM

## 2013-12-16 DIAGNOSIS — I420 Dilated cardiomyopathy: Secondary | ICD-10-CM

## 2013-12-16 DIAGNOSIS — I9589 Other hypotension: Secondary | ICD-10-CM

## 2013-12-16 DIAGNOSIS — I428 Other cardiomyopathies: Secondary | ICD-10-CM | POA: Diagnosis present

## 2013-12-16 NOTE — Progress Notes (Signed)
.    HPI The patient has a history of a nonischemic cardiomyopathy. Her ejection fraction was at one point in time her EF was 15 - 20%. The most recent ejection fraction was about 35% in 2013. I did repeat an echo with preliminary results as below. She gets along well. The patient denies any new symptoms such as chest discomfort, neck or arm discomfort. There has been no new PND or orthopnea. There have been no reported palpitations, presyncope or syncope. She denies any shortness of breath. She's had no PND or orthopnea. She's had no weight gain or edema.  Of note she has a strong family history of dilated cardiomyopathy. Her brother has a heart transplant.  No Known Allergies  Current Outpatient Prescriptions  Medication Sig Dispense Refill  . digoxin (LANOXIN) 0.125 MG tablet Take 1 tablet (125 mcg total) by mouth daily. 90 tablet 3  . furosemide (LASIX) 40 MG tablet Take 1 tablet (40 mg total) by mouth daily. Dose increase 90 tablet 1  . lisinopril (PRINIVIL,ZESTRIL) 10 MG tablet TAKE 1 BY MOUTH DAILY 90 tablet 0  . metoprolol succinate (TOPROL-XL) 50 MG 24 hr tablet Take 1 tablet (50 mg total) by mouth daily. 90 tablet 3  . spironolactone (ALDACTONE) 25 MG tablet Take 12.5 mg by mouth daily.    Marland Kitchen warfarin (COUMADIN) 5 MG tablet 1 tablet daily except 1/2 tablet on Mondays, Wednesdays and Fridays or as directed by coumadin clinic 90 tablet 3   No current facility-administered medications for this visit.    Past Medical History  Diagnosis Date  . Breast cancer     hx  . Cerebrovascular disease     s/p right MCA cardioemoblic infarct 07/31/28  . Nonischemic cardiomyopathy     +LMN mutation  . Automatic implantable cardiac defibrillator in situ     [employer]  . Edema   . CHF (congestive heart failure)     Past Surgical History  Procedure Laterality Date  . Cardiac defibrillator placement      [employer] remote    ROS:  As stated in the HPI and negative for all other  systems.  PHYSICAL EXAM BP 122/72 mmHg  Pulse 62  Ht 5' 7.5" (1.715 m)  Wt 162 lb 11.2 oz (73.8 kg)  BMI 25.09 kg/m2 GENERAL:  Well appearing NECK:  No jugular venous distention, waveform within normal limits, carotid upstroke brisk and symmetric, no bruits, no thyromegaly LUNGS:  Clear to auscultation bilaterally BACK:  No CVA tenderness CHEST:  Well healed ICD pocket HEART:  PMI not displaced or sustained,S1 and S2 within normal limits, no S3, no S4, no clicks, no rubs, no murmurs ABD:  Flat, positive bowel sounds normal in frequency in pitch, no bruits, no rebound, no guarding, no midline pulsatile mass, no hepatomegaly, no splenomegaly EXT:  2 plus pulses throughout, no edema, no cyanosis no clubbing   ASSESSMENT AND PLAN  CARDIOMYOPATHY, PRIMARY, DILATED - I did review the echo and the preliminary suggests the EF is around 30% and unchanged from previous. She's having no new symptoms. No change in therapy is indicated.   Implantable defibrillator -  She follows in our defibrillator clinic.   CVA -  History of CVA felt to be cardioembolic in nature. Continue Coumadin.

## 2013-12-16 NOTE — Patient Instructions (Signed)
Continue same medications  Your physician wants you to follow-up in: 6 months after sees EP. You will receive a reminder letter in the mail two months in advance. If you don't receive a letter, please call our office to schedule the follow-up appointment.

## 2013-12-16 NOTE — Progress Notes (Signed)
2D Echo Performed 12/16/2013    Marygrace Drought, RCS

## 2013-12-21 ENCOUNTER — Encounter: Payer: Self-pay | Admitting: Cardiology

## 2013-12-21 NOTE — Telephone Encounter (Signed)
Contacted the pt to inform her that per Dr Percival Spanish her EF is about 40 - 45%, which is good.  Also informed the pt that per Dr Percival Spanish we will send these results to Gar Ponto, MD, pts PCP.  Pt verbalized understanding and pleased with this news.

## 2014-01-12 ENCOUNTER — Ambulatory Visit (INDEPENDENT_AMBULATORY_CARE_PROVIDER_SITE_OTHER): Payer: BC Managed Care – PPO | Admitting: *Deleted

## 2014-01-12 DIAGNOSIS — I6350 Cerebral infarction due to unspecified occlusion or stenosis of unspecified cerebral artery: Secondary | ICD-10-CM

## 2014-01-12 DIAGNOSIS — Z7901 Long term (current) use of anticoagulants: Secondary | ICD-10-CM

## 2014-01-12 DIAGNOSIS — I639 Cerebral infarction, unspecified: Secondary | ICD-10-CM

## 2014-01-12 LAB — POCT INR: INR: 3.3

## 2014-02-16 ENCOUNTER — Ambulatory Visit (INDEPENDENT_AMBULATORY_CARE_PROVIDER_SITE_OTHER): Payer: BLUE CROSS/BLUE SHIELD | Admitting: *Deleted

## 2014-02-16 DIAGNOSIS — I639 Cerebral infarction, unspecified: Secondary | ICD-10-CM

## 2014-02-16 DIAGNOSIS — I6350 Cerebral infarction due to unspecified occlusion or stenosis of unspecified cerebral artery: Secondary | ICD-10-CM

## 2014-02-16 DIAGNOSIS — Z7901 Long term (current) use of anticoagulants: Secondary | ICD-10-CM

## 2014-02-16 LAB — POCT INR: INR: 1.7

## 2014-02-20 ENCOUNTER — Other Ambulatory Visit: Payer: Self-pay | Admitting: Internal Medicine

## 2014-02-20 ENCOUNTER — Ambulatory Visit (INDEPENDENT_AMBULATORY_CARE_PROVIDER_SITE_OTHER): Payer: BLUE CROSS/BLUE SHIELD | Admitting: *Deleted

## 2014-02-20 DIAGNOSIS — I429 Cardiomyopathy, unspecified: Secondary | ICD-10-CM

## 2014-02-20 DIAGNOSIS — Z95810 Presence of automatic (implantable) cardiac defibrillator: Secondary | ICD-10-CM

## 2014-02-20 DIAGNOSIS — I5022 Chronic systolic (congestive) heart failure: Secondary | ICD-10-CM

## 2014-02-20 DIAGNOSIS — I428 Other cardiomyopathies: Secondary | ICD-10-CM

## 2014-02-23 LAB — MDC_IDC_ENUM_SESS_TYPE_REMOTE
Battery Remaining Longevity: 16 mo
Battery Voltage: 2.54 V
Brady Statistic AP VP Percent: 7.7 %
Brady Statistic RA Percent Paced: 31 %
Date Time Interrogation Session: 20160111142227
HIGH POWER IMPEDANCE MEASURED VALUE: 66 Ohm
HighPow Impedance: 66 Ohm
Implantable Pulse Generator Serial Number: 468775
Lead Channel Pacing Threshold Amplitude: 0.5 V
Lead Channel Pacing Threshold Amplitude: 1 V
Lead Channel Pacing Threshold Pulse Width: 0.5 ms
Lead Channel Sensing Intrinsic Amplitude: 1.7 mV
Lead Channel Sensing Intrinsic Amplitude: 12 mV
Lead Channel Setting Pacing Amplitude: 2 V
Lead Channel Setting Pacing Pulse Width: 0.5 ms
Lead Channel Setting Sensing Sensitivity: 0.3 mV
MDC IDC MSMT LEADCHNL RA IMPEDANCE VALUE: 330 Ohm
MDC IDC MSMT LEADCHNL RV IMPEDANCE VALUE: 560 Ohm
MDC IDC MSMT LEADCHNL RV PACING THRESHOLD PULSEWIDTH: 0.5 ms
MDC IDC SET LEADCHNL RV PACING AMPLITUDE: 2.5 V
MDC IDC SET ZONE DETECTION INTERVAL: 280 ms
MDC IDC STAT BRADY AP VS PERCENT: 24 %
MDC IDC STAT BRADY AS VP PERCENT: 1 %
MDC IDC STAT BRADY AS VS PERCENT: 69 %
MDC IDC STAT BRADY RV PERCENT PACED: 7.7 %
Zone Setting Detection Interval: 250 ms
Zone Setting Detection Interval: 330 ms

## 2014-02-23 NOTE — Progress Notes (Signed)
ICD remote received. Sensing, impedances consistent with previous device measurements. Histograms appropriate for patient and level of activity. AT/AF burden <1%. No ventricular arrhythmias recorded. All other diagnostic data reviewed and is appropriate and stable for patient. Real time EGM demonstrates appropriate sensing. Estimated longevity 1.41yrs. Merlin 05/25/14 & ROV w/ SK 10/16.

## 2014-03-16 ENCOUNTER — Ambulatory Visit (INDEPENDENT_AMBULATORY_CARE_PROVIDER_SITE_OTHER): Payer: BLUE CROSS/BLUE SHIELD | Admitting: *Deleted

## 2014-03-16 DIAGNOSIS — Z7901 Long term (current) use of anticoagulants: Secondary | ICD-10-CM

## 2014-03-16 DIAGNOSIS — I639 Cerebral infarction, unspecified: Secondary | ICD-10-CM

## 2014-03-16 DIAGNOSIS — I6350 Cerebral infarction due to unspecified occlusion or stenosis of unspecified cerebral artery: Secondary | ICD-10-CM

## 2014-03-16 LAB — POCT INR: INR: 2.5

## 2014-04-13 ENCOUNTER — Ambulatory Visit (INDEPENDENT_AMBULATORY_CARE_PROVIDER_SITE_OTHER): Payer: BLUE CROSS/BLUE SHIELD | Admitting: *Deleted

## 2014-04-13 DIAGNOSIS — Z7901 Long term (current) use of anticoagulants: Secondary | ICD-10-CM

## 2014-04-13 DIAGNOSIS — I639 Cerebral infarction, unspecified: Secondary | ICD-10-CM

## 2014-04-13 DIAGNOSIS — I6350 Cerebral infarction due to unspecified occlusion or stenosis of unspecified cerebral artery: Secondary | ICD-10-CM

## 2014-04-13 LAB — POCT INR: INR: 3.4

## 2014-04-20 ENCOUNTER — Encounter: Payer: Self-pay | Admitting: *Deleted

## 2014-04-26 ENCOUNTER — Encounter: Payer: Self-pay | Admitting: Internal Medicine

## 2014-05-23 ENCOUNTER — Ambulatory Visit (INDEPENDENT_AMBULATORY_CARE_PROVIDER_SITE_OTHER): Payer: BLUE CROSS/BLUE SHIELD | Admitting: *Deleted

## 2014-05-23 DIAGNOSIS — I639 Cerebral infarction, unspecified: Secondary | ICD-10-CM

## 2014-05-23 DIAGNOSIS — Z7901 Long term (current) use of anticoagulants: Secondary | ICD-10-CM

## 2014-05-23 DIAGNOSIS — I6350 Cerebral infarction due to unspecified occlusion or stenosis of unspecified cerebral artery: Secondary | ICD-10-CM

## 2014-05-23 LAB — POCT INR: INR: 2.8

## 2014-05-25 ENCOUNTER — Ambulatory Visit (INDEPENDENT_AMBULATORY_CARE_PROVIDER_SITE_OTHER): Payer: BLUE CROSS/BLUE SHIELD | Admitting: *Deleted

## 2014-05-25 DIAGNOSIS — I429 Cardiomyopathy, unspecified: Secondary | ICD-10-CM

## 2014-05-25 DIAGNOSIS — I428 Other cardiomyopathies: Secondary | ICD-10-CM

## 2014-05-25 NOTE — Progress Notes (Signed)
Remote ICD transmission.   

## 2014-06-02 ENCOUNTER — Other Ambulatory Visit: Payer: Self-pay | Admitting: *Deleted

## 2014-06-02 MED ORDER — SPIRONOLACTONE 25 MG PO TABS: 12.5000 mg | ORAL_TABLET | Freq: Every day | ORAL | Status: DC

## 2014-06-02 MED ORDER — LISINOPRIL 10 MG PO TABS: ORAL_TABLET | ORAL | Status: DC

## 2014-06-02 MED ORDER — DIGOXIN 125 MCG PO TABS: 125.0000 ug | ORAL_TABLET | Freq: Every day | ORAL | Status: DC

## 2014-06-04 LAB — MDC_IDC_ENUM_SESS_TYPE_REMOTE
Battery Remaining Longevity: 25 mo
Battery Voltage: 2.56 V
Brady Statistic AP VP Percent: 8.4 %
Brady Statistic AP VS Percent: 22 %
Brady Statistic AS VS Percent: 69 %
Brady Statistic RA Percent Paced: 31 %
Brady Statistic RV Percent Paced: 8.4 %
Date Time Interrogation Session: 20160414062204
HighPow Impedance: 60 Ohm
HighPow Impedance: 60 Ohm
Lead Channel Setting Pacing Amplitude: 2 V
Lead Channel Setting Pacing Amplitude: 2.5 V
Lead Channel Setting Pacing Pulse Width: 0.5 ms
MDC IDC MSMT LEADCHNL RA IMPEDANCE VALUE: 340 Ohm
MDC IDC MSMT LEADCHNL RA SENSING INTR AMPL: 1.2 mV
MDC IDC MSMT LEADCHNL RV IMPEDANCE VALUE: 460 Ohm
MDC IDC MSMT LEADCHNL RV SENSING INTR AMPL: 11.8 mV
MDC IDC PG SERIAL: 468775
MDC IDC SET LEADCHNL RV SENSING SENSITIVITY: 0.3 mV
MDC IDC STAT BRADY AS VP PERCENT: 1 %
Zone Setting Detection Interval: 250 ms
Zone Setting Detection Interval: 280 ms
Zone Setting Detection Interval: 330 ms

## 2014-06-27 ENCOUNTER — Ambulatory Visit (INDEPENDENT_AMBULATORY_CARE_PROVIDER_SITE_OTHER): Payer: BLUE CROSS/BLUE SHIELD | Admitting: *Deleted

## 2014-06-27 DIAGNOSIS — I639 Cerebral infarction, unspecified: Secondary | ICD-10-CM | POA: Diagnosis not present

## 2014-06-27 DIAGNOSIS — I6350 Cerebral infarction due to unspecified occlusion or stenosis of unspecified cerebral artery: Secondary | ICD-10-CM

## 2014-06-27 DIAGNOSIS — Z7901 Long term (current) use of anticoagulants: Secondary | ICD-10-CM

## 2014-06-27 LAB — POCT INR: INR: 3.3

## 2014-06-30 ENCOUNTER — Encounter: Payer: Self-pay | Admitting: Cardiology

## 2014-07-04 ENCOUNTER — Encounter: Payer: Self-pay | Admitting: Internal Medicine

## 2014-07-14 ENCOUNTER — Encounter: Payer: Self-pay | Admitting: Cardiology

## 2014-07-20 ENCOUNTER — Ambulatory Visit (INDEPENDENT_AMBULATORY_CARE_PROVIDER_SITE_OTHER): Payer: BLUE CROSS/BLUE SHIELD | Admitting: *Deleted

## 2014-07-20 DIAGNOSIS — I6350 Cerebral infarction due to unspecified occlusion or stenosis of unspecified cerebral artery: Secondary | ICD-10-CM

## 2014-07-20 DIAGNOSIS — I639 Cerebral infarction, unspecified: Secondary | ICD-10-CM

## 2014-07-20 DIAGNOSIS — Z7901 Long term (current) use of anticoagulants: Secondary | ICD-10-CM | POA: Diagnosis not present

## 2014-07-20 LAB — POCT INR: INR: 2.7

## 2014-08-17 ENCOUNTER — Ambulatory Visit (INDEPENDENT_AMBULATORY_CARE_PROVIDER_SITE_OTHER): Payer: BLUE CROSS/BLUE SHIELD | Admitting: *Deleted

## 2014-08-17 DIAGNOSIS — I639 Cerebral infarction, unspecified: Secondary | ICD-10-CM

## 2014-08-17 DIAGNOSIS — Z7901 Long term (current) use of anticoagulants: Secondary | ICD-10-CM | POA: Diagnosis not present

## 2014-08-17 DIAGNOSIS — I6350 Cerebral infarction due to unspecified occlusion or stenosis of unspecified cerebral artery: Secondary | ICD-10-CM

## 2014-08-17 LAB — POCT INR: INR: 2.6

## 2014-08-26 ENCOUNTER — Encounter: Payer: Self-pay | Admitting: Internal Medicine

## 2014-08-28 ENCOUNTER — Ambulatory Visit (INDEPENDENT_AMBULATORY_CARE_PROVIDER_SITE_OTHER): Payer: BLUE CROSS/BLUE SHIELD | Admitting: *Deleted

## 2014-08-28 DIAGNOSIS — I428 Other cardiomyopathies: Secondary | ICD-10-CM | POA: Diagnosis not present

## 2014-08-28 DIAGNOSIS — I429 Cardiomyopathy, unspecified: Secondary | ICD-10-CM

## 2014-08-28 NOTE — Progress Notes (Signed)
Remote ICD transmission.   

## 2014-08-29 LAB — CUP PACEART REMOTE DEVICE CHECK
Battery Voltage: 2.54 V
Brady Statistic AP VP Percent: 11 %
Brady Statistic AS VP Percent: 1 %
Brady Statistic AS VS Percent: 68 %
Brady Statistic RA Percent Paced: 32 %
Brady Statistic RV Percent Paced: 11 %
HIGH POWER IMPEDANCE MEASURED VALUE: 65 Ohm
HighPow Impedance: 65 Ohm
Lead Channel Impedance Value: 350 Ohm
Lead Channel Pacing Threshold Amplitude: 0.5 V
Lead Channel Pacing Threshold Amplitude: 1 V
Lead Channel Pacing Threshold Pulse Width: 0.5 ms
Lead Channel Pacing Threshold Pulse Width: 0.5 ms
Lead Channel Sensing Intrinsic Amplitude: 11.8 mV
Lead Channel Setting Pacing Amplitude: 2.5 V
Lead Channel Setting Pacing Pulse Width: 0.5 ms
Lead Channel Setting Sensing Sensitivity: 0.3 mV
MDC IDC MSMT BATTERY REMAINING LONGEVITY: 14 mo
MDC IDC MSMT LEADCHNL RA SENSING INTR AMPL: 1 mV
MDC IDC MSMT LEADCHNL RV IMPEDANCE VALUE: 440 Ohm
MDC IDC PG SERIAL: 468775
MDC IDC SESS DTM: 20160716065447
MDC IDC SET LEADCHNL RA PACING AMPLITUDE: 2 V
MDC IDC SET ZONE DETECTION INTERVAL: 280 ms
MDC IDC STAT BRADY AP VS PERCENT: 22 %
Zone Setting Detection Interval: 250 ms
Zone Setting Detection Interval: 330 ms

## 2014-09-14 ENCOUNTER — Ambulatory Visit (INDEPENDENT_AMBULATORY_CARE_PROVIDER_SITE_OTHER): Payer: BLUE CROSS/BLUE SHIELD | Admitting: *Deleted

## 2014-09-14 DIAGNOSIS — I6350 Cerebral infarction due to unspecified occlusion or stenosis of unspecified cerebral artery: Secondary | ICD-10-CM

## 2014-09-14 DIAGNOSIS — Z7901 Long term (current) use of anticoagulants: Secondary | ICD-10-CM | POA: Diagnosis not present

## 2014-09-14 DIAGNOSIS — I639 Cerebral infarction, unspecified: Secondary | ICD-10-CM

## 2014-09-14 LAB — POCT INR: INR: 2.7

## 2014-09-20 ENCOUNTER — Encounter: Payer: Self-pay | Admitting: Cardiology

## 2014-09-22 ENCOUNTER — Other Ambulatory Visit: Payer: Self-pay | Admitting: *Deleted

## 2014-09-22 MED ORDER — SPIRONOLACTONE 25 MG PO TABS: 12.5000 mg | ORAL_TABLET | Freq: Every day | ORAL | Status: DC

## 2014-09-22 MED ORDER — DIGOXIN 125 MCG PO TABS: 125.0000 ug | ORAL_TABLET | Freq: Every day | ORAL | Status: DC

## 2014-09-22 MED ORDER — LISINOPRIL 10 MG PO TABS
ORAL_TABLET | ORAL | Status: DC
Start: 2014-09-22 — End: 2014-12-08

## 2014-10-04 ENCOUNTER — Encounter: Payer: Self-pay | Admitting: Cardiology

## 2014-10-26 ENCOUNTER — Ambulatory Visit (INDEPENDENT_AMBULATORY_CARE_PROVIDER_SITE_OTHER): Payer: BLUE CROSS/BLUE SHIELD | Admitting: *Deleted

## 2014-10-26 DIAGNOSIS — Z7901 Long term (current) use of anticoagulants: Secondary | ICD-10-CM | POA: Diagnosis not present

## 2014-10-26 DIAGNOSIS — I6350 Cerebral infarction due to unspecified occlusion or stenosis of unspecified cerebral artery: Secondary | ICD-10-CM

## 2014-10-26 DIAGNOSIS — I639 Cerebral infarction, unspecified: Secondary | ICD-10-CM | POA: Diagnosis not present

## 2014-10-26 LAB — POCT INR: INR: 2.8

## 2014-12-07 ENCOUNTER — Ambulatory Visit (INDEPENDENT_AMBULATORY_CARE_PROVIDER_SITE_OTHER): Payer: BLUE CROSS/BLUE SHIELD | Admitting: *Deleted

## 2014-12-07 DIAGNOSIS — Z7901 Long term (current) use of anticoagulants: Secondary | ICD-10-CM

## 2014-12-07 DIAGNOSIS — I6350 Cerebral infarction due to unspecified occlusion or stenosis of unspecified cerebral artery: Secondary | ICD-10-CM | POA: Diagnosis not present

## 2014-12-07 LAB — POCT INR: INR: 2.7

## 2014-12-08 ENCOUNTER — Other Ambulatory Visit: Payer: Self-pay | Admitting: Cardiology

## 2014-12-08 MED ORDER — DIGOXIN 125 MCG PO TABS: 125.0000 ug | ORAL_TABLET | Freq: Every day | ORAL | Status: DC

## 2014-12-08 MED ORDER — SPIRONOLACTONE 25 MG PO TABS: 12.5000 mg | ORAL_TABLET | Freq: Every day | ORAL | Status: DC

## 2014-12-08 MED ORDER — LISINOPRIL 10 MG PO TABS: ORAL_TABLET | ORAL | Status: DC

## 2014-12-11 ENCOUNTER — Telehealth: Payer: Self-pay | Admitting: Cardiology

## 2014-12-12 NOTE — Telephone Encounter (Signed)
Closed encounter °

## 2014-12-15 ENCOUNTER — Other Ambulatory Visit: Payer: Self-pay | Admitting: Cardiology

## 2014-12-20 ENCOUNTER — Other Ambulatory Visit: Payer: Self-pay

## 2014-12-20 MED ORDER — FUROSEMIDE 40 MG PO TABS: 40.0000 mg | ORAL_TABLET | Freq: Every day | ORAL | Status: DC

## 2014-12-20 MED ORDER — LISINOPRIL 10 MG PO TABS: ORAL_TABLET | ORAL | Status: DC

## 2014-12-20 MED ORDER — DIGOXIN 125 MCG PO TABS: 125.0000 ug | ORAL_TABLET | Freq: Every day | ORAL | Status: DC

## 2014-12-22 ENCOUNTER — Ambulatory Visit (INDEPENDENT_AMBULATORY_CARE_PROVIDER_SITE_OTHER): Payer: BLUE CROSS/BLUE SHIELD | Admitting: Cardiology

## 2014-12-22 ENCOUNTER — Encounter: Payer: Self-pay | Admitting: Cardiology

## 2014-12-22 VITALS — BP 100/62 | HR 61 | Ht 68.0 in | Wt 166.4 lb

## 2014-12-22 DIAGNOSIS — I429 Cardiomyopathy, unspecified: Secondary | ICD-10-CM | POA: Diagnosis not present

## 2014-12-22 DIAGNOSIS — I6350 Cerebral infarction due to unspecified occlusion or stenosis of unspecified cerebral artery: Secondary | ICD-10-CM

## 2014-12-22 MED ORDER — LISINOPRIL 10 MG PO TABS: ORAL_TABLET | ORAL | Status: DC

## 2014-12-22 MED ORDER — SPIRONOLACTONE 25 MG PO TABS: 12.5000 mg | ORAL_TABLET | Freq: Every day | ORAL | Status: DC

## 2014-12-22 MED ORDER — FUROSEMIDE 40 MG PO TABS: 40.0000 mg | ORAL_TABLET | Freq: Every day | ORAL | Status: DC

## 2014-12-22 MED ORDER — DIGOXIN 125 MCG PO TABS: 125.0000 ug | ORAL_TABLET | Freq: Every day | ORAL | Status: DC

## 2014-12-22 NOTE — Progress Notes (Signed)
.    HPI The patient has a history of a nonischemic cardiomyopathy. Her ejection fraction was at one point in time her EF was 15 - 20%. The most recent ejection fraction was about 40% in 2015.   She is doing relatively well although she does have some mild increased lower extremity swelling. The patient denies any new symptoms such as chest discomfort, neck or arm discomfort. There has been no new PND or orthopnea. There have been no reported palpitations, presyncope or syncope.  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 In addition her sister-in-law who was a patient of mine died suddenly this year. The family's had a hard time dealing with this.  Of note she has a strong family history of dilated cardiomyopathy. Her brother has a heart transplant.   No Known Allergies  Current Outpatient Prescriptions  Medication Sig Dispense Refill  . digoxin (LANOXIN) 0.125 MG tablet Take 1 tablet (125 mcg total) by mouth daily. 90 tablet 0  . furosemide (LASIX) 40 MG tablet Take 1 tablet (40 mg total) by mouth daily. Dose increase 90 tablet 0  . lisinopril (PRINIVIL,ZESTRIL) 10 MG tablet TAKE 1 BY MOUTH DAILY 90 tablet 0  . metoprolol succinate (TOPROL-XL) 50 MG 24 hr tablet Take 1 tablet (50 mg total) by mouth daily. 90 tablet 3  . spironolactone (ALDACTONE) 25 MG tablet TAKE 1/2 BY MOUTH DAILY MUST KEEP UP COMING APPOINTMENT FOR REFILLS 15 tablet 0  . warfarin (COUMADIN) 5 MG tablet 1 tablet daily except 1/2 tablet on Mondays, Wednesdays and Fridays or as directed by coumadin clinic 90 tablet 3   No current facility-administered medications for this visit.    Past Medical History  Diagnosis Date  . Breast cancer     hx  . Cerebrovascular disease     s/p right MCA cardioemoblic infarct [id number]  . Nonischemic cardiomyopathy (HCC)     +LMN mutation  . Automatic implantable cardiac defibrillator in situ     [employer]  . Edema   . CHF (congestive heart failure)     Past  Surgical History  Procedure Laterality Date  . Cardiac defibrillator placement      [employer] remote    ROS:  As stated in the HPI and negative for all other systems.  PHYSICAL EXAM There were no vitals taken for this visit. GENERAL:  Well appearing NECK:  No jugular venous distention, waveform within normal limits, carotid upstroke brisk and symmetric, no bruits, no thyromegaly LUNGS:  Clear to auscultation bilaterally BACK:  No CVA tenderness CHEST:  Well healed ICD pocket HEART:  PMI not displaced or sustained,S1 and S2 within normal limits, no S3, no S4, no clicks, no rubs, no murmurs ABD:  Flat, positive bowel sounds normal in frequency in pitch, no bruits, no rebound, no guarding, no midline pulsatile mass, no hepatomegaly, no splenomegaly EXT:  2 plus pulses throughout, no edema, no cyanosis no clubbing  EKG:  Atrial paced rhythm with rate 61, borderline IVCD.  12/22/2014  ASSESSMENT AND PLAN  CARDIOMYOPATHY, PRIMARY, DILATED - Given the increased swelling I will check an echocardiogram this year. I will consider starting Entresto and will discuss the relative value of doing this in a patient with a stable ejection fraction and minimal symptoms.  Implantable defibrillator -  She follows in our defibrillator clinic.   CVA -  History of CVA felt to be cardioembolic in nature. Continue Coumadin.

## 2014-12-22 NOTE — Patient Instructions (Signed)

## 2014-12-29 ENCOUNTER — Ambulatory Visit: Payer: BLUE CROSS/BLUE SHIELD | Admitting: Internal Medicine

## 2015-01-11 ENCOUNTER — Ambulatory Visit (INDEPENDENT_AMBULATORY_CARE_PROVIDER_SITE_OTHER): Payer: BLUE CROSS/BLUE SHIELD | Admitting: *Deleted

## 2015-01-11 DIAGNOSIS — I6350 Cerebral infarction due to unspecified occlusion or stenosis of unspecified cerebral artery: Secondary | ICD-10-CM

## 2015-01-11 DIAGNOSIS — Z7901 Long term (current) use of anticoagulants: Secondary | ICD-10-CM | POA: Diagnosis not present

## 2015-01-11 LAB — POCT INR: INR: 2.4

## 2015-02-08 ENCOUNTER — Other Ambulatory Visit: Payer: Self-pay | Admitting: Cardiology

## 2015-02-08 NOTE — Telephone Encounter (Signed)
Rx has been sent to the pharmacy electronically. ° °

## 2015-02-20 ENCOUNTER — Encounter: Payer: BLUE CROSS/BLUE SHIELD | Admitting: Internal Medicine

## 2015-02-20 ENCOUNTER — Other Ambulatory Visit (HOSPITAL_COMMUNITY): Payer: BLUE CROSS/BLUE SHIELD

## 2015-02-22 ENCOUNTER — Ambulatory Visit (INDEPENDENT_AMBULATORY_CARE_PROVIDER_SITE_OTHER): Payer: BLUE CROSS/BLUE SHIELD | Admitting: *Deleted

## 2015-02-22 DIAGNOSIS — I6350 Cerebral infarction due to unspecified occlusion or stenosis of unspecified cerebral artery: Secondary | ICD-10-CM | POA: Diagnosis not present

## 2015-02-22 DIAGNOSIS — Z7901 Long term (current) use of anticoagulants: Secondary | ICD-10-CM | POA: Diagnosis not present

## 2015-02-22 LAB — POCT INR: INR: 2.2

## 2015-04-02 ENCOUNTER — Ambulatory Visit (INDEPENDENT_AMBULATORY_CARE_PROVIDER_SITE_OTHER): Payer: BLUE CROSS/BLUE SHIELD | Admitting: Internal Medicine

## 2015-04-02 ENCOUNTER — Encounter: Payer: Self-pay | Admitting: Internal Medicine

## 2015-04-02 ENCOUNTER — Other Ambulatory Visit: Payer: Self-pay | Admitting: Cardiology

## 2015-04-02 ENCOUNTER — Other Ambulatory Visit: Payer: Self-pay

## 2015-04-02 ENCOUNTER — Encounter: Payer: BLUE CROSS/BLUE SHIELD | Admitting: Internal Medicine

## 2015-04-02 ENCOUNTER — Ambulatory Visit (HOSPITAL_COMMUNITY): Payer: BLUE CROSS/BLUE SHIELD | Attending: Cardiovascular Disease

## 2015-04-02 VITALS — BP 116/60 | HR 68 | Ht 68.0 in | Wt 161.0 lb

## 2015-04-02 DIAGNOSIS — I517 Cardiomegaly: Secondary | ICD-10-CM | POA: Insufficient documentation

## 2015-04-02 DIAGNOSIS — I429 Cardiomyopathy, unspecified: Secondary | ICD-10-CM | POA: Diagnosis not present

## 2015-04-02 DIAGNOSIS — I5022 Chronic systolic (congestive) heart failure: Secondary | ICD-10-CM | POA: Diagnosis not present

## 2015-04-02 DIAGNOSIS — I340 Nonrheumatic mitral (valve) insufficiency: Secondary | ICD-10-CM | POA: Insufficient documentation

## 2015-04-02 DIAGNOSIS — I509 Heart failure, unspecified: Secondary | ICD-10-CM | POA: Diagnosis not present

## 2015-04-02 DIAGNOSIS — Z95810 Presence of automatic (implantable) cardiac defibrillator: Secondary | ICD-10-CM

## 2015-04-02 DIAGNOSIS — I420 Dilated cardiomyopathy: Secondary | ICD-10-CM | POA: Diagnosis not present

## 2015-04-02 DIAGNOSIS — I313 Pericardial effusion (noninflammatory): Secondary | ICD-10-CM | POA: Insufficient documentation

## 2015-04-02 LAB — CUP PACEART INCLINIC DEVICE CHECK
Battery Remaining Longevity: 4.6
Battery Voltage: 2.5 V
Brady Statistic RA Percent Paced: 33 %
Brady Statistic RV Percent Paced: 11 %
HIGH POWER IMPEDANCE MEASURED VALUE: 59.625
Implantable Lead Implant Date: 20100416
Implantable Lead Location: 753860
Implantable Lead Model: 7122
Lead Channel Impedance Value: 375 Ohm
Lead Channel Impedance Value: 425 Ohm
Lead Channel Pacing Threshold Amplitude: 0.5 V
Lead Channel Pacing Threshold Amplitude: 1 V
Lead Channel Pacing Threshold Amplitude: 1 V
Lead Channel Pacing Threshold Pulse Width: 0.5 ms
Lead Channel Sensing Intrinsic Amplitude: 11.8 mV
Lead Channel Setting Pacing Amplitude: 2 V
Lead Channel Setting Pacing Amplitude: 2.5 V
Lead Channel Setting Pacing Pulse Width: 0.5 ms
MDC IDC LEAD IMPLANT DT: 20100416
MDC IDC LEAD LOCATION: 753859
MDC IDC MSMT LEADCHNL RA PACING THRESHOLD AMPLITUDE: 0.5 V
MDC IDC MSMT LEADCHNL RA PACING THRESHOLD PULSEWIDTH: 0.5 ms
MDC IDC MSMT LEADCHNL RA SENSING INTR AMPL: 2.1 mV
MDC IDC MSMT LEADCHNL RV PACING THRESHOLD PULSEWIDTH: 0.5 ms
MDC IDC MSMT LEADCHNL RV PACING THRESHOLD PULSEWIDTH: 0.5 ms
MDC IDC PG SERIAL: 468775
MDC IDC SESS DTM: 20170220213515
MDC IDC SET LEADCHNL RV SENSING SENSITIVITY: 0.3 mV

## 2015-04-02 LAB — BASIC METABOLIC PANEL
BUN: 16 mg/dL (ref 7–25)
CALCIUM: 9.5 mg/dL (ref 8.6–10.4)
CO2: 28 mmol/L (ref 20–31)
Chloride: 104 mmol/L (ref 98–110)
Creat: 0.73 mg/dL (ref 0.50–0.99)
GLUCOSE: 108 mg/dL — AB (ref 65–99)
POTASSIUM: 4.2 mmol/L (ref 3.5–5.3)
SODIUM: 141 mmol/L (ref 135–146)

## 2015-04-02 NOTE — Patient Instructions (Signed)
Medication Instructions: - Your physician recommends that you continue on your current medications as directed. Please refer to the Current Medication list given to you today.  Labwork: - Your physician recommends that you have lab work today: [employer]   Procedures/Testing: - none  Follow-Up: - Your physician recommends that you schedule a follow-up appointment in: 3 months with Chanetta Marshall, NP for Dr. Caryl Comes.  Any Additional Special Instructions Will Be Listed Below (If Applicable).     If you need a refill on your cardiac medications before your next appointment, please call your pharmacy.

## 2015-04-02 NOTE — Progress Notes (Signed)
      Patient Care Team: Caryl Bis, MD as PCP - General   HPI  [name] [name] is a 65 y.o. female Seen in followup for ICD implanted for primary prevention in the setting of nonischemic heart disease  The patient denies chest pain, shortness of breath, nocturnal dyspnea, orthopnea  Some peripheral edema occasionallty  Prompting extra lasix. There have been no palpitations, lightheadedness or syncope.  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 Breast cancer ([place])     hx  . Cerebrovascular disease     s/p right MCA cardioemoblic infarct [id number]  . Nonischemic cardiomyopathy (HCC)     +LMN mutation  . Automatic implantable cardiac defibrillator in situ     [employer]  . Edema   . CHF (congestive heart failure) [facility])     Past Surgical History  Procedure Laterality Date  . Cardiac defibrillator placement      [employer] remote    Current Outpatient Prescriptions  Medication Sig Dispense Refill  . digoxin (LANOXIN) 0.125 MG tablet Take 1 tablet (125 mcg total) by mouth daily. 90 tablet 3  . furosemide (LASIX) 40 MG tablet Take 40 mg by mouth daily as needed for edema.    Marland Kitchen lisinopril (PRINIVIL,ZESTRIL) 10 MG tablet TAKE 1 BY MOUTH DAILY 90 tablet 3  . metoprolol succinate (TOPROL-XL) 50 MG 24 hr tablet TAKE 1 BY MOUTH DAILY 90 tablet 3  . spironolactone (ALDACTONE) 25 MG tablet Take 0.5 tablets (12.5 mg total) by mouth daily. 90 tablet 3  . warfarin (COUMADIN) 5 MG tablet 1 tablet daily except 1/2 tablet on Mondays, Wednesdays and Fridays or as directed by coumadin clinic 90 tablet 3   No current facility-administered medications for this visit.    No Known Allergies  Review of Systems negative except from HPI and PMH  Physical Exam BP 116/60 mmHg  Pulse 68  Ht 5\' 8"  (1.727 m)  Wt 161 lb (73.029 kg)  BMI 24.49 kg/m2 Well developed and well nourished in no acute distress HENT normal E scleral and icterus clear Neck Supple JVP  flat; carotids brisk and full Clear to ausculation Device pocket well healed; without hematoma or erythema.  There is no tethering Regular rate and rhythm, no murmurs gallops or rub Soft with active bowel sounds No clubbing cyanosis none Edema Alert and oriented, grossly normal motor and sensory function Skin Warm and Dry    Assessment and  Plan  DCM  ICD  Fam hx of + gene mutation  High risk medication surveillance  Discussed with her the implications of gene testing on her brothers to confirm the putative gene mutation.   1 she is doing well. We will check her potassium and digoxin levlees

## 2015-04-03 ENCOUNTER — Telehealth: Payer: Self-pay | Admitting: Internal Medicine

## 2015-04-03 ENCOUNTER — Ambulatory Visit (INDEPENDENT_AMBULATORY_CARE_PROVIDER_SITE_OTHER): Payer: BLUE CROSS/BLUE SHIELD | Admitting: *Deleted

## 2015-04-03 DIAGNOSIS — I6350 Cerebral infarction due to unspecified occlusion or stenosis of unspecified cerebral artery: Secondary | ICD-10-CM | POA: Diagnosis not present

## 2015-04-03 DIAGNOSIS — Z7901 Long term (current) use of anticoagulants: Secondary | ICD-10-CM | POA: Diagnosis not present

## 2015-04-03 LAB — POCT INR: INR: 1.8

## 2015-04-03 NOTE — Telephone Encounter (Signed)
I am in the hospital this week. She saw SK yesterday. Will send to Alvis Lemmings, triage  Chanetta Marshall, NP 04/03/2015 11:25 AM

## 2015-04-03 NOTE — Telephone Encounter (Signed)
Pt says she needs to talk to you only. This is concerning getting her pacemaker changed.

## 2015-04-12 ENCOUNTER — Telehealth: Payer: Self-pay | Admitting: Internal Medicine

## 2015-04-12 NOTE — Telephone Encounter (Signed)
Previous phone encounter open will close this encounter.

## 2015-04-12 NOTE — Telephone Encounter (Signed)
Follow up ° ° °Pt returning rn call °

## 2015-04-12 NOTE — Telephone Encounter (Signed)
I left a message for the patient to call. 

## 2015-04-12 NOTE — Telephone Encounter (Signed)
I spoke with the patient. She states she is supposed to follow up with Amber in May since her device is approaching ERI. She was concerned as her grandchildren are coming up in late May and she is going to [place] in June. She was wanting to see if her battery could be changed out prior to that.  I advised her that insurance will not pay for the battery change out until ERI is reached and even then, she should have about a 3 month window to have this replaced, therefore her summer plans should not be interrupted. I advised her I will forward a message to Lorenda Hatchet to contact her about a follow up appointment in early May. She is agreeable.

## 2015-04-22 NOTE — Progress Notes (Signed)
.    HPI The patient has a history of a nonischemic cardiomyopathy. Her ejection fraction was at one point in time her EF was 15 - 20%. The most recent ejection fraction was about 40% in 2015.   Last month the EF was 25% again.  She is doing well although with no increased lower extremity swelling. The patient denies any new symptoms such as chest discomfort, neck or arm discomfort. There has been no new PND or orthopnea. There have been no reported palpitations, presyncope or syncope.  Of note she has a strong family history of dilated cardiomyopathy. Her brother has a heart transplant.  Her other brother is being considered for an LVAD.  Saw klein.    No Known Allergies  Current Outpatient Prescriptions  Medication Sig Dispense Refill  . digoxin (LANOXIN) 0.125 MG tablet Take 1 tablet (125 mcg total) by mouth daily. 90 tablet 3  . furosemide (LASIX) 40 MG tablet Take 40 mg by mouth daily as needed for edema.    Marland Kitchen lisinopril (PRINIVIL,ZESTRIL) 10 MG tablet TAKE 1 BY MOUTH DAILY 90 tablet 3  . metoprolol succinate (TOPROL-XL) 50 MG 24 hr tablet TAKE 1 BY MOUTH DAILY 90 tablet 3  . spironolactone (ALDACTONE) 25 MG tablet Take 0.5 tablets (12.5 mg total) by mouth daily. 90 tablet 3  . warfarin (COUMADIN) 5 MG tablet 1 tablet daily except 1/2 tablet on Mondays, Wednesdays and Fridays or as directed by coumadin clinic 90 tablet 3   No current facility-administered medications for this visit.    Past Medical History  Diagnosis Date  . Breast cancer ([place])     hx  . Cerebrovascular disease     s/p right MCA cardioemoblic infarct [id number]  . Nonischemic cardiomyopathy (HCC)     +LMN mutation  . Automatic implantable cardiac defibrillator in situ     [employer]  . Edema   . CHF (congestive heart failure) [facility])     Past Surgical History  Procedure Laterality Date  . Cardiac defibrillator placement      [employer] remote    ROS:  As stated in the HPI and negative for all other  systems.  PHYSICAL EXAM BP 146/80 mmHg  Pulse 76  Ht 5\' 8"  (1.727 m)  Wt 167 lb (75.751 kg)  BMI 25.40 kg/m2 GENERAL:  Well appearing NECK:  No jugular venous distention, waveform within normal limits, carotid upstroke brisk and symmetric, no bruits, no thyromegaly LUNGS:  Clear to auscultation bilaterally BACK:  No CVA tenderness CHEST:  Well healed ICD pocket HEART:  PMI not displaced or sustained,S1 and S2 within normal limits, no S3, no S4, no clicks, no rubs, no murmurs ABD:  Flat, positive bowel sounds normal in frequency in pitch, no bruits, no rebound, no guarding, no midline pulsatile mass, no hepatomegaly, no splenomegaly EXT:  2 plus pulses throughout, no edema, no cyanosis no clubbing  ASSESSMENT AND PLAN  CARDIOMYOPATHY, PRIMARY, DILATED - Her EF is slightly lower.  I am going to increase her lisinopril and further titrate her meds.  I will see her back in two weeks for a BMET and to titrate. . I will consider starting Entresto.  Implantable defibrillator -  She follows in our defibrillator clinic.   CVA -  History of CVA felt to be cardioembolic in nature. Continue Coumadin.

## 2015-04-23 ENCOUNTER — Encounter: Payer: Self-pay | Admitting: Cardiology

## 2015-04-23 ENCOUNTER — Ambulatory Visit (INDEPENDENT_AMBULATORY_CARE_PROVIDER_SITE_OTHER): Payer: BLUE CROSS/BLUE SHIELD | Admitting: Cardiology

## 2015-04-23 VITALS — BP 146/80 | HR 76 | Ht 68.0 in | Wt 167.0 lb

## 2015-04-23 DIAGNOSIS — Z79899 Other long term (current) drug therapy: Secondary | ICD-10-CM | POA: Diagnosis not present

## 2015-04-23 MED ORDER — LISINOPRIL 10 MG PO TABS: ORAL_TABLET | ORAL | Status: DC

## 2015-04-23 NOTE — Patient Instructions (Signed)
Your physician recommends that you schedule a follow-up appointment in: 2 Weeks  Your physician recommends that you return for lab work in: [place] has recommended you make the following change in your medication: Increase Lisinopril 10 mg twice a day

## 2015-04-24 ENCOUNTER — Ambulatory Visit (INDEPENDENT_AMBULATORY_CARE_PROVIDER_SITE_OTHER): Payer: BLUE CROSS/BLUE SHIELD | Admitting: *Deleted

## 2015-04-24 DIAGNOSIS — I6350 Cerebral infarction due to unspecified occlusion or stenosis of unspecified cerebral artery: Secondary | ICD-10-CM

## 2015-04-24 DIAGNOSIS — Z7901 Long term (current) use of anticoagulants: Secondary | ICD-10-CM

## 2015-04-24 LAB — POCT INR: INR: 2.1

## 2015-05-07 DIAGNOSIS — T8549XA Other mechanical complication of breast prosthesis and implant, initial encounter: Secondary | ICD-10-CM | POA: Insufficient documentation

## 2015-05-08 LAB — BASIC METABOLIC PANEL
BUN: 19 mg/dL (ref 7–25)
CALCIUM: 9.7 mg/dL (ref 8.6–10.4)
CO2: 28 mmol/L (ref 20–31)
Chloride: 100 mmol/L (ref 98–110)
Creat: 0.89 mg/dL (ref 0.50–0.99)
GLUCOSE: 94 mg/dL (ref 65–99)
Potassium: 4.6 mmol/L (ref 3.5–5.3)
SODIUM: 139 mmol/L (ref 135–146)

## 2015-05-09 ENCOUNTER — Ambulatory Visit (INDEPENDENT_AMBULATORY_CARE_PROVIDER_SITE_OTHER): Payer: BLUE CROSS/BLUE SHIELD | Admitting: Cardiology

## 2015-05-09 ENCOUNTER — Telehealth: Payer: Self-pay | Admitting: Cardiology

## 2015-05-09 VITALS — BP 107/67 | HR 63 | Ht 68.0 in | Wt 161.0 lb

## 2015-05-09 DIAGNOSIS — I420 Dilated cardiomyopathy: Secondary | ICD-10-CM

## 2015-05-09 MED ORDER — METOPROLOL SUCCINATE ER 50 MG PO TB24: ORAL_TABLET | ORAL | Status: DC

## 2015-05-09 NOTE — Patient Instructions (Signed)
Your physician recommends that you schedule a follow-up appointment in: [place] has recommended you make the following change in your medication: Take an extra Half of Metoprolol 25 mg in the Evening

## 2015-05-09 NOTE — Progress Notes (Signed)
.  HPI The patient has a history of a nonischemic cardiomyopathy. Her ejection fraction was at one point in time her EF was 15 - 20%. The most recent ejection fraction was about 40% in 2015.   Most recently the EF was 25% again. . The patient denies any new symptoms such as chest discomfort, neck or arm discomfort. There has been no new PND or orthopnea. There have been no reported palpitations, presyncope or syncope.  Of note she has a strong family history of dilated cardiomyopathy. Her brother has a heart transplant.  Her other brother is being considered for an LVAD. At the last visit I increased her lisinopril.  She had one symptoms with this.     No Known Allergies  Current Outpatient Prescriptions  Medication Sig Dispense Refill  . digoxin (LANOXIN) 0.125 MG tablet Take 1 tablet (125 mcg total) by mouth daily. 90 tablet 3  . furosemide (LASIX) 40 MG tablet Take 40 mg by mouth daily as needed for edema.    Marland Kitchen lisinopril (PRINIVIL,ZESTRIL) 10 MG tablet TAKE 1 BY MOUTH TWICE A DAY 60 tablet 0  . metoprolol succinate (TOPROL-XL) 50 MG 24 hr tablet TAKE 1 BY MOUTH DAILY 90 tablet 3  . spironolactone (ALDACTONE) 25 MG tablet Take 0.5 tablets (12.5 mg total) by mouth daily. 90 tablet 3  . warfarin (COUMADIN) 5 MG tablet 1 tablet daily except 1/2 tablet on Mondays, Wednesdays and Fridays or as directed by coumadin clinic 90 tablet 3   No current facility-administered medications for this visit.    Past Medical History  Diagnosis Date  . Breast cancer ([place])     hx  . Cerebrovascular disease     s/p right MCA cardioemoblic infarct [id number]  . Nonischemic cardiomyopathy (H[facility])     +LMN mutation  . Automatic implantable cardiac defibrillator in situ     [employer] Judes  . Edema   . CHF (congestive heart failure) [facility])     Past Surgical History  Procedure Laterality Date  . Cardiac defibrillator placement      St. Jude remote    ROS:  As stated in the HPI and negative for all other  systems.  PHYSICAL EXAM BP 107/67 mmHg  Pulse 63  Ht 5\' 8"  (1.727 m)  Wt 161 lb (73.029 kg)  BMI 24.49 kg/m2 GENERAL:  Well appearing NECK:  No jugular venous distention, waveform within normal limits, carotid upstroke brisk and symmetric, no bruits, no thyromegaly LUNGS:  Clear to auscultation bilaterally BACK:  No CVA tenderness CHEST:  Well healed ICD pocket HEART:  PMI not displaced or sustained,S1 and S2 within normal limits, no S3, no S4, no clicks, no rubs, no murmurs ABD:  Flat, positive bowel sounds normal in frequency in pitch, no bruits, no rebound, no guarding, no midline pulsatile mass, no hepatomegaly, no splenomegaly EXT:  2 plus pulses throughout, no edema, no cyanosis no clubbing  ASSESSMENT AND PLAN  CARDIOMYOPATHY, PRIMARY, DILATED - Her EF is slightly lower.  I am going to increase her metoprolol and further titrate her meds.  I will see her back in two weeks for a BMET and to titrate.  I will consider starting Entresto in the future.  Implantable defibrillator -  She follows in our defibrillator clinic.   CVA -  History of CVA felt to be cardioembolic in nature. Continue Coumadin.   PREOP - She needs surgery for a deflated breast implant.  She would have no contraindication to this.  She needs  usual ICD management for this.  She will need to hold her warfarin and will need Lovenox bridging.  We will contact our Coumadin clinic.

## 2015-05-09 NOTE — Telephone Encounter (Signed)
Dr. Crissie Reese called in wanting to know if Dr. Percival Spanish would clear her for breast reconstruction surgery from breast cancer. He also inquired about what should be done about her coumadin, if she can stop it altogether or can it be bridged with Lovenox. He says that he will be faxing over a clearance form to  [phone]. Please f/u with Dr. Harlow Mares

## 2015-05-09 NOTE — Telephone Encounter (Signed)
Message sent to Dr.Hochrein for advice. °

## 2015-05-10 ENCOUNTER — Encounter: Payer: Self-pay | Admitting: Cardiology

## 2015-05-10 NOTE — Telephone Encounter (Signed)
Office note send to Dr Harlow Mares office via Epic clearing patient for surgery, Lattie Haw in Coumadin clinic in [place] was contacted about Levenox bridging for Mrs [name] [name], she will contact patient as to when she need to start Levenox.

## 2015-05-22 ENCOUNTER — Ambulatory Visit (INDEPENDENT_AMBULATORY_CARE_PROVIDER_SITE_OTHER): Payer: BLUE CROSS/BLUE SHIELD | Admitting: *Deleted

## 2015-05-22 DIAGNOSIS — I6350 Cerebral infarction due to unspecified occlusion or stenosis of unspecified cerebral artery: Secondary | ICD-10-CM

## 2015-05-22 DIAGNOSIS — Z7901 Long term (current) use of anticoagulants: Secondary | ICD-10-CM

## 2015-05-22 LAB — POCT INR: INR: 2.2

## 2015-05-23 ENCOUNTER — Other Ambulatory Visit (HOSPITAL_COMMUNITY): Payer: Self-pay | Admitting: Plastic Surgery

## 2015-05-23 ENCOUNTER — Inpatient Hospital Stay (HOSPITAL_COMMUNITY)
Admission: RE | Admit: 2015-05-23 | Discharge: 2015-05-23 | Disposition: A | Discharge status: Home / Self Care | Payer: BLUE CROSS/BLUE SHIELD | Source: Ambulatory Visit

## 2015-05-29 ENCOUNTER — Encounter (HOSPITAL_COMMUNITY): Payer: Self-pay

## 2015-05-29 ENCOUNTER — Encounter (HOSPITAL_COMMUNITY)
Admission: RE | Admit: 2015-05-29 | Discharge: 2015-05-29 | Disposition: A | Discharge status: Home / Self Care | Payer: BLUE CROSS/BLUE SHIELD | Source: Ambulatory Visit | Attending: Plastic Surgery | Admitting: Plastic Surgery

## 2015-05-29 DIAGNOSIS — T8549XA Other mechanical complication of breast prosthesis and implant, initial encounter: Secondary | ICD-10-CM | POA: Diagnosis not present

## 2015-05-29 DIAGNOSIS — Z8673 Personal history of transient ischemic attack (TIA), and cerebral infarction without residual deficits: Secondary | ICD-10-CM | POA: Diagnosis not present

## 2015-05-29 DIAGNOSIS — Z950 Presence of cardiac pacemaker: Secondary | ICD-10-CM | POA: Diagnosis not present

## 2015-05-29 DIAGNOSIS — Y831 Surgical operation with implant of artificial internal device as the cause of abnormal reaction of the patient, or of later complication, without mention of misadventure at the time of the procedure: Secondary | ICD-10-CM | POA: Diagnosis not present

## 2015-05-29 DIAGNOSIS — Z853 Personal history of malignant neoplasm of breast: Secondary | ICD-10-CM | POA: Diagnosis not present

## 2015-05-29 DIAGNOSIS — I509 Heart failure, unspecified: Secondary | ICD-10-CM | POA: Diagnosis not present

## 2015-05-29 DIAGNOSIS — N651 Disproportion of reconstructed breast: Secondary | ICD-10-CM | POA: Diagnosis not present

## 2015-05-29 HISTORY — DX: Unspecified osteoarthritis, unspecified site: M19.90

## 2015-05-29 HISTORY — DX: Presence of automatic (implantable) cardiac defibrillator: Z95.810

## 2015-05-29 HISTORY — DX: Personal history of urinary calculi: Z87.442

## 2015-05-29 HISTORY — DX: Cerebral infarction, unspecified: I63.9

## 2015-05-29 LAB — BASIC METABOLIC PANEL
ANION GAP: 11 (ref 5–15)
BUN: 14 mg/dL (ref 6–20)
CALCIUM: 9.3 mg/dL (ref 8.9–10.3)
CO2: 27 mmol/L (ref 22–32)
Chloride: 102 mmol/L (ref 101–111)
Creatinine, Ser: 0.74 mg/dL (ref 0.44–1.00)
GFR calc Af Amer: >60 mL/min (ref >60)
Glucose, Bld: 102 mg/dL — ABNORMAL HIGH (ref 65–99)
POTASSIUM: 3.8 mmol/L (ref 3.5–5.1)
SODIUM: 140 mmol/L (ref 135–145)

## 2015-05-29 LAB — CBC
HCT: 39.6 % (ref 36.0–46.0)
Hemoglobin: 12.7 g/dL (ref 12.0–15.0)
MCH: 28.4 pg (ref 26.0–34.0)
MCHC: 32.1 g/dL (ref 30.0–36.0)
MCV: 88.6 fL (ref 78.0–100.0)
PLATELETS: 175 10*3/uL (ref 150–400)
RBC: 4.47 MIL/uL (ref 3.87–5.11)
RDW: 14.6 % (ref 11.5–15.5)
WBC: 7.2 10*3/uL (ref 4.0–10.5)

## 2015-05-29 LAB — PROTIME-INR
INR: 1.11 (ref 0.00–1.49)
PROTHROMBIN TIME: 14.5 s (ref 11.6–15.2)

## 2015-05-29 NOTE — Progress Notes (Signed)
PCP - Dr. Gar Ponto Cardiologist - Dr. Percival Spanish  EKG - 12/22/14 CXR - denies Echo - 03/2015 Stress test/Cardiac Cath - denies  Patient denies chest pain and shortness of breath at PAT appointment.  Patient has a Sport and exercise psychologist. Jude ICD.  St. Jude rep notified of procedure date and time.    Patient states that her last dose of Coumadin was 4/12 and that she started Lovenox 100 mg on 4/14.

## 2015-05-29 NOTE — Progress Notes (Addendum)
Anesthesia Chart Review:  Pt is a 65 year old female scheduled for removal R breast implants, R breast reconstruction with saline implants, L breast mastopexy on 05/31/2015 with Dr. Harlow Mares.   Cardiologist is Dr. Minus Breeding who has cleared pt for surgery.   PMH includes:  CHF, nonischemic cardiomyopathy, AICD (St Jude), stroke, breast cancer. Never smoker. BMI 25  Medications include: digoxin, lovenox, lasix, lisinopril, metoprolol, spironolactone, coumadin. Pt stopped coumadin 4/12 and started lovenox 4/14.   Preoperative labs reviewed.    EKG 12/22/14: AV paced rhythm with prolonged AV conduction. Septal infarct, age undetermined.   Echo 04/02/15:  - Left ventricle: The cavity size was mildly dilated. Wall thickness was normal. Systolic function was severely reduced. The estimated ejection fraction was in the range of 25% to 30%. Severe diffuse hypokinesis with no identifiable regional variations. Doppler parameters are consistent with abnormal left ventricular relaxation (grade 1 diastolic dysfunction). No evidence of thrombus. - Mitral valve: Systolic bowing without prolapse. There was mild regurgitation. - Left atrium: The atrium was mildly dilated. - Pericardium, extracardiac: A trivial pericardial effusion was identified.  Perioperative prescription for ICD programming notes procedure will likely interfere with device function. Device should be programmed to have tachy therapies disabled. Provide continuous ECG monitoring. Device rep was notified by PAT RN.   If no changes, I anticipate pt can proceed with surgery as scheduled.   Willeen Cass, FNP-BC [facility]/Anesthesiology Phone: [phone] 05/29/2015 4:15 PM

## 2015-05-29 NOTE — Pre-Procedure Instructions (Signed)
    ASTER DAUER  05/29/2015      [facility] - [address] [place], [place] [address] Phone: [phone] Fax: [phone]  [facility]) [place], [place] [address] Phone: [phone] Fax: [phone]  [facility] - [place], [place], [place] - [place] [address] [place] [address] Phone: [phone] Fax: [phone]  [facility] [address] [place] - [address] [place] [address] Phone: [phone] Fax: [phone]    Your procedure is scheduled on Thursday, April 20th, 2017  Report to [facility] Admitting at 5:30 A.M.   Call this number if you have problems the morning of surgery:  [phone]   Remember:  Do not eat food or drink liquids after midnight.   Take these medicines the morning of surgery with A SIP OF WATER: Digoxin (Lanoxin), Metoprolol Succinate (Toprol-XL).  14 days prior to surgery, stop taking the following: Warfarin (Coumadin), Aspirin, NSAIDS, Aleve, Naproxen, Ibuprofen, Advil, Motrin, BC's, Goody's, Fish oil, all herbal medications, and all vitamins.    Do not wear jewelry, make-up or nail polish.  Do not wear lotions, powders, or perfumes.  You may NOT wear deodorant.  Do not shave 48 hours prior to surgery.    Do not bring valuables to the hospital.   [facility] is not responsible for any belongings or valuables.  Contacts, dentures or bridgework may not be worn into surgery.  Leave your suitcase in the car.  After surgery it may be brought to your room.  For patients admitted to the hospital, discharge time will be determined by your treatment team.  Patients discharged the day of surgery will not be allowed to drive home.   Special instructions:  See attached.   Please read over the following fact sheets that you  were given. Pain Booklet, Coughing and Deep Breathing and Surgical Site Infection Prevention

## 2015-05-31 ENCOUNTER — Encounter (HOSPITAL_COMMUNITY): Payer: Self-pay | Admitting: *Deleted

## 2015-05-31 ENCOUNTER — Encounter (HOSPITAL_COMMUNITY)
Admission: RE | Disposition: A | Discharge status: Home / Self Care | Payer: Self-pay | Source: Ambulatory Visit | Attending: Plastic Surgery

## 2015-05-31 ENCOUNTER — Ambulatory Visit (HOSPITAL_COMMUNITY)
Admission: RE | Admit: 2015-05-31 | Discharge: 2015-06-01 | Disposition: A | Discharge status: Home / Self Care | Payer: BLUE CROSS/BLUE SHIELD | Source: Ambulatory Visit | Attending: Plastic Surgery | Admitting: Plastic Surgery

## 2015-05-31 ENCOUNTER — Ambulatory Visit (HOSPITAL_COMMUNITY): Payer: BLUE CROSS/BLUE SHIELD | Admitting: Anesthesiology

## 2015-05-31 ENCOUNTER — Ambulatory Visit (HOSPITAL_COMMUNITY): Payer: BLUE CROSS/BLUE SHIELD | Admitting: Emergency Medicine

## 2015-05-31 DIAGNOSIS — Z9889 Other specified postprocedural states: Secondary | ICD-10-CM

## 2015-05-31 DIAGNOSIS — Z950 Presence of cardiac pacemaker: Secondary | ICD-10-CM | POA: Insufficient documentation

## 2015-05-31 DIAGNOSIS — Y831 Surgical operation with implant of artificial internal device as the cause of abnormal reaction of the patient, or of later complication, without mention of misadventure at the time of the procedure: Secondary | ICD-10-CM | POA: Insufficient documentation

## 2015-05-31 DIAGNOSIS — Z853 Personal history of malignant neoplasm of breast: Secondary | ICD-10-CM | POA: Insufficient documentation

## 2015-05-31 DIAGNOSIS — T8549XA Other mechanical complication of breast prosthesis and implant, initial encounter: Secondary | ICD-10-CM | POA: Diagnosis not present

## 2015-05-31 DIAGNOSIS — Z8673 Personal history of transient ischemic attack (TIA), and cerebral infarction without residual deficits: Secondary | ICD-10-CM | POA: Insufficient documentation

## 2015-05-31 DIAGNOSIS — N651 Disproportion of reconstructed breast: Secondary | ICD-10-CM | POA: Insufficient documentation

## 2015-05-31 DIAGNOSIS — I509 Heart failure, unspecified: Secondary | ICD-10-CM | POA: Insufficient documentation

## 2015-05-31 HISTORY — PX: BREAST RECONSTRUCTION: SHX9

## 2015-05-31 HISTORY — PX: MASTOPEXY: SHX5358

## 2015-05-31 HISTORY — PX: BREAST IMPLANT REMOVAL: SHX5361

## 2015-05-31 SURGERY — REMOVAL, IMPLANT, BREAST
Anesthesia: General | Site: Chest | Laterality: Right

## 2015-05-31 MED ORDER — ONDANSETRON HCL 4 MG/2ML IJ SOLN
4.0000 mg | Freq: Four times a day (QID) | INTRAMUSCULAR | Status: DC | PRN
  Administered 2015-05-31: 4 mg via INTRAVENOUS
  Filled 2015-05-31 (×2): qty 2

## 2015-05-31 MED ORDER — DEXTROSE-NACL 5-0.45 % IV SOLN
INTRAVENOUS | Status: DC
  Administered 2015-05-31: 22:00:00 via INTRAVENOUS

## 2015-05-31 MED ORDER — LISINOPRIL 10 MG PO TABS
10.0000 mg | ORAL_TABLET | Freq: Two times a day (BID) | ORAL | Status: DC
  Administered 2015-05-31 – 2015-06-01 (×2): 10 mg via ORAL
  Filled 2015-05-31 (×3): qty 1

## 2015-05-31 MED ORDER — HEPARIN SODIUM (PORCINE) 5000 UNIT/ML IJ SOLN
5000.0000 [IU] | Freq: Once | INTRAMUSCULAR | Status: AC
  Administered 2015-05-31: 5000 [IU] via SUBCUTANEOUS

## 2015-05-31 MED ORDER — HYDROMORPHONE HCL 2 MG PO TABS
2.0000 mg | ORAL_TABLET | ORAL | Status: DC | PRN
  Filled 2015-05-31: qty 1

## 2015-05-31 MED ORDER — ROCURONIUM BROMIDE 100 MG/10ML IV SOLN
INTRAVENOUS | Status: DC | PRN
  Administered 2015-05-31: 20 mg via INTRAVENOUS
  Administered 2015-05-31: 30 mg via INTRAVENOUS
  Administered 2015-05-31: 50 mg via INTRAVENOUS

## 2015-05-31 MED ORDER — ETOMIDATE 2 MG/ML IV SOLN
INTRAVENOUS | Status: DC | PRN
  Administered 2015-05-31: 10 mg via INTRAVENOUS

## 2015-05-31 MED ORDER — LIDOCAINE HCL (CARDIAC) 20 MG/ML IV SOLN
INTRAVENOUS | Status: AC
  Filled 2015-05-31: qty 5

## 2015-05-31 MED ORDER — MIDAZOLAM HCL 2 MG/2ML IJ SOLN
INTRAMUSCULAR | Status: AC
  Filled 2015-05-31: qty 2

## 2015-05-31 MED ORDER — PHENYLEPHRINE HCL 10 MG/ML IJ SOLN
10.0000 mg | INTRAVENOUS | Status: DC | PRN
  Administered 2015-05-31: 25 ug/min via INTRAVENOUS

## 2015-05-31 MED ORDER — FENTANYL CITRATE (PF) 250 MCG/5ML IJ SOLN
INTRAMUSCULAR | Status: AC
Start: 2015-05-31 — End: 2015-05-31
  Filled 2015-05-31: qty 5

## 2015-05-31 MED ORDER — SUGAMMADEX SODIUM 200 MG/2ML IV SOLN
INTRAVENOUS | Status: DC | PRN
  Administered 2015-05-31: 200 mg via INTRAVENOUS

## 2015-05-31 MED ORDER — LACTATED RINGERS IV SOLN
INTRAVENOUS | Status: DC | PRN
  Administered 2015-05-31 (×2): via INTRAVENOUS

## 2015-05-31 MED ORDER — FUROSEMIDE 40 MG PO TABS: 40.0000 mg | ORAL_TABLET | Freq: Every day | ORAL | Status: DC | PRN

## 2015-05-31 MED ORDER — CEFAZOLIN SODIUM-DEXTROSE 2-4 GM/100ML-% IV SOLN
2.0000 g | INTRAVENOUS | Status: AC
  Administered 2015-05-31: 2 g via INTRAVENOUS

## 2015-05-31 MED ORDER — PROPOFOL 10 MG/ML IV BOLUS
INTRAVENOUS | Status: AC
  Filled 2015-05-31: qty 20

## 2015-05-31 MED ORDER — ONDANSETRON HCL 4 MG/2ML IJ SOLN
INTRAMUSCULAR | Status: DC | PRN
  Administered 2015-05-31: 4 mg via INTRAVENOUS

## 2015-05-31 MED ORDER — METOPROLOL SUCCINATE ER 25 MG PO TB24
25.0000 mg | ORAL_TABLET | Freq: Two times a day (BID) | ORAL | Status: DC
  Administered 2015-06-01: 25 mg via ORAL
  Filled 2015-05-31: qty 2
  Filled 2015-05-31: qty 1

## 2015-05-31 MED ORDER — HYDROMORPHONE HCL 1 MG/ML IJ SOLN
INTRAMUSCULAR | Status: AC
  Filled 2015-05-31: qty 1

## 2015-05-31 MED ORDER — ROCURONIUM BROMIDE 50 MG/5ML IV SOLN
INTRAVENOUS | Status: AC
  Filled 2015-05-31: qty 2

## 2015-05-31 MED ORDER — DOCUSATE SODIUM 100 MG PO CAPS
100.0000 mg | ORAL_CAPSULE | Freq: Every day | ORAL | Status: DC
  Administered 2015-05-31 – 2015-06-01 (×2): 100 mg via ORAL
  Filled 2015-05-31: qty 1

## 2015-05-31 MED ORDER — ROCURONIUM BROMIDE 50 MG/5ML IV SOLN
INTRAVENOUS | Status: AC
  Filled 2015-05-31: qty 1

## 2015-05-31 MED ORDER — METHOCARBAMOL 500 MG PO TABS
500.0000 mg | ORAL_TABLET | Freq: Four times a day (QID) | ORAL | Status: DC
Start: 2015-05-31 — End: 2015-06-01
  Administered 2015-05-31 – 2015-06-01 (×4): 500 mg via ORAL
  Filled 2015-05-31 (×4): qty 1

## 2015-05-31 MED ORDER — HYDROMORPHONE HCL 1 MG/ML IJ SOLN
0.2500 mg | INTRAMUSCULAR | Status: DC | PRN
  Administered 2015-05-31 (×2): 0.5 mg via INTRAVENOUS

## 2015-05-31 MED ORDER — GENTAMICIN SULFATE 40 MG/ML IJ SOLN
INTRAMUSCULAR | Status: DC | PRN
  Administered 2015-05-31: 09:00:00

## 2015-05-31 MED ORDER — HYDROMORPHONE HCL 1 MG/ML IJ SOLN
0.5000 mg | INTRAMUSCULAR | Status: DC | PRN
  Administered 2015-05-31: 1 mg via INTRAVENOUS
  Filled 2015-05-31: qty 1

## 2015-05-31 MED ORDER — DEXAMETHASONE SODIUM PHOSPHATE 10 MG/ML IJ SOLN
INTRAMUSCULAR | Status: AC
  Filled 2015-05-31: qty 1

## 2015-05-31 MED ORDER — BACITRACIN ZINC 500 UNIT/GM EX OINT
TOPICAL_OINTMENT | CUTANEOUS | Status: AC
  Filled 2015-05-31: qty 28.35

## 2015-05-31 MED ORDER — LACTATED RINGERS IV SOLN: INTRAVENOUS | Status: DC

## 2015-05-31 MED ORDER — SODIUM CHLORIDE 0.9 % IV SOLN
Freq: Once | INTRAVENOUS | Status: DC
  Filled 2015-05-31 (×2): qty 1

## 2015-05-31 MED ORDER — ACETAMINOPHEN 325 MG PO TABS
650.0000 mg | ORAL_TABLET | Freq: Four times a day (QID) | ORAL | Status: DC | PRN
  Administered 2015-06-01: 650 mg via ORAL
  Filled 2015-05-31: qty 2

## 2015-05-31 MED ORDER — MIDAZOLAM HCL 5 MG/5ML IJ SOLN
INTRAMUSCULAR | Status: DC | PRN
  Administered 2015-05-31: 2 mg via INTRAVENOUS

## 2015-05-31 MED ORDER — ARTIFICIAL TEARS OP OINT
TOPICAL_OINTMENT | OPHTHALMIC | Status: DC | PRN
  Administered 2015-05-31: 1 via OPHTHALMIC

## 2015-05-31 MED ORDER — HEPARIN SODIUM (PORCINE) 5000 UNIT/ML IJ SOLN
INTRAMUSCULAR | Status: AC
  Administered 2015-05-31: 5000 [IU] via SUBCUTANEOUS
  Filled 2015-05-31: qty 1

## 2015-05-31 MED ORDER — DEXAMETHASONE SODIUM PHOSPHATE 10 MG/ML IJ SOLN
INTRAMUSCULAR | Status: DC | PRN
  Administered 2015-05-31: 10 mg via INTRAVENOUS

## 2015-05-31 MED ORDER — SPIRONOLACTONE 25 MG PO TABS
12.5000 mg | ORAL_TABLET | Freq: Every day | ORAL | Status: DC
  Administered 2015-05-31 – 2015-06-01 (×2): 12.5 mg via ORAL
  Filled 2015-05-31 (×2): qty 1

## 2015-05-31 MED ORDER — LIDOCAINE HCL (CARDIAC) 20 MG/ML IV SOLN
INTRAVENOUS | Status: DC | PRN
  Administered 2015-05-31: 50 mg via INTRAVENOUS

## 2015-05-31 MED ORDER — CEFAZOLIN SODIUM-DEXTROSE 2-4 GM/100ML-% IV SOLN
INTRAVENOUS | Status: AC
  Filled 2015-05-31: qty 100

## 2015-05-31 MED ORDER — ENOXAPARIN SODIUM 100 MG/ML ~~LOC~~ SOLN
100.0000 mg | SUBCUTANEOUS | Status: DC
  Administered 2015-05-31: 100 mg via SUBCUTANEOUS
  Filled 2015-05-31: qty 1

## 2015-05-31 MED ORDER — ONDANSETRON HCL 4 MG/2ML IJ SOLN
INTRAMUSCULAR | Status: AC
  Filled 2015-05-31: qty 2

## 2015-05-31 MED ORDER — FENTANYL CITRATE (PF) 100 MCG/2ML IJ SOLN
INTRAMUSCULAR | Status: DC | PRN
  Administered 2015-05-31 (×3): 50 ug via INTRAVENOUS

## 2015-05-31 MED ORDER — CEFAZOLIN SODIUM 1-5 GM-% IV SOLN
1.0000 g | Freq: Three times a day (TID) | INTRAVENOUS | Status: DC
Start: 2015-05-31 — End: 2015-06-01
  Administered 2015-05-31 – 2015-06-01 (×3): 1 g via INTRAVENOUS
  Filled 2015-05-31 (×5): qty 50

## 2015-05-31 MED ORDER — 0.9 % SODIUM CHLORIDE (POUR BTL) OPTIME
TOPICAL | Status: DC | PRN
  Administered 2015-05-31 (×2): 1000 mL

## 2015-05-31 MED ORDER — ETOMIDATE 2 MG/ML IV SOLN
INTRAVENOUS | Status: AC
  Filled 2015-05-31: qty 10

## 2015-05-31 MED ORDER — SUGAMMADEX SODIUM 200 MG/2ML IV SOLN
INTRAVENOUS | Status: AC
  Filled 2015-05-31: qty 2

## 2015-05-31 MED ORDER — DIGOXIN 125 MCG PO TABS
125.0000 ug | ORAL_TABLET | Freq: Every day | ORAL | Status: DC
  Administered 2015-06-01: 125 ug via ORAL
  Filled 2015-05-31: qty 1

## 2015-05-31 SURGICAL SUPPLY — 79 items
APPLIER CLIP 9.375 MED OPEN (MISCELLANEOUS)
ATCH SMKEVC FLXB CAUT HNDSWH (FILTER) ×2 IMPLANT
BAG DECANTER FOR FLEXI CONT (MISCELLANEOUS) ×3 IMPLANT
BINDER BREAST LRG (GAUZE/BANDAGES/DRESSINGS) ×3 IMPLANT
BINDER BREAST XLRG (GAUZE/BANDAGES/DRESSINGS) IMPLANT
BIOPATCH RED 1 DISK 7.0 (GAUZE/BANDAGES/DRESSINGS) ×6 IMPLANT
BLADE 10 SAFETY STRL DISP (BLADE) ×3 IMPLANT
CANISTER SUCTION 2500CC (MISCELLANEOUS) ×3 IMPLANT
CHLORAPREP W/TINT 26ML (MISCELLANEOUS) ×3 IMPLANT
CLIP APPLIE 9.375 MED OPEN (MISCELLANEOUS) IMPLANT
COTTONBALL LRG STERILE PKG (GAUZE/BANDAGES/DRESSINGS) ×3 IMPLANT
COVER SURGICAL LIGHT HANDLE (MISCELLANEOUS) ×3 IMPLANT
DERMABOND ADVANCED (GAUZE/BANDAGES/DRESSINGS) ×1
DERMABOND ADVANCED .7 DNX12 (GAUZE/BANDAGES/DRESSINGS) ×2 IMPLANT
DRAIN CHANNEL 19F RND (DRAIN) ×3 IMPLANT
DRAPE LAPAROSCOPIC ABDOMINAL (DRAPES) IMPLANT
DRAPE ORTHO SPLIT 77X108 STRL (DRAPES) ×2
DRAPE PROXIMA HALF (DRAPES) ×6 IMPLANT
DRAPE SURG 17X23 STRL (DRAPES) ×12 IMPLANT
DRAPE SURG ORHT 6 SPLT 77X108 (DRAPES) ×4 IMPLANT
DRAPE UTILITY XL STRL (DRAPES) IMPLANT
DRAPE WARM FLUID 44X44 (DRAPE) ×3 IMPLANT
DRSG PAD ABDOMINAL 8X10 ST (GAUZE/BANDAGES/DRESSINGS) IMPLANT
DRSG SORBAVIEW 3.5X5-5/16 MED (GAUZE/BANDAGES/DRESSINGS) ×3 IMPLANT
DRSG TEGADERM 2-3/8X2-3/4 SM (GAUZE/BANDAGES/DRESSINGS) ×3 IMPLANT
DRSG TEGADERM 4X4.75 (GAUZE/BANDAGES/DRESSINGS) ×3 IMPLANT
DRSG TELFA 3X8 NADH (GAUZE/BANDAGES/DRESSINGS) IMPLANT
ELECT BLADE 6.5 EXT (BLADE) IMPLANT
ELECT CAUTERY BLADE 6.4 (BLADE) ×3 IMPLANT
ELECT REM PT RETURN 9FT ADLT (ELECTROSURGICAL) ×3
ELECTRODE REM PT RTRN 9FT ADLT (ELECTROSURGICAL) ×2 IMPLANT
EVACUATOR SILICONE 100CC (DRAIN) ×3 IMPLANT
EVACUATOR SMOKE ACCUVAC VALLEY (FILTER) ×1
GAUZE SPONGE 4X4 12PLY STRL (GAUZE/BANDAGES/DRESSINGS) ×6 IMPLANT
GAUZE XEROFORM 5X9 LF (GAUZE/BANDAGES/DRESSINGS) IMPLANT
GLOVE BIO SURGEON STRL SZ7.5 (GLOVE) ×3 IMPLANT
GLOVE BIO SURGEON STRL SZ8 (GLOVE) ×3 IMPLANT
GLOVE BIOGEL PI IND STRL 8 (GLOVE) ×2 IMPLANT
GLOVE BIOGEL PI INDICATOR 8 (GLOVE) ×1
GLOVE ECLIPSE 6.5 STRL STRAW (GLOVE) ×3 IMPLANT
GOWN STRL REUS W/ TWL LRG LVL3 (GOWN DISPOSABLE) ×6 IMPLANT
GOWN STRL REUS W/ TWL XL LVL3 (GOWN DISPOSABLE) ×2 IMPLANT
GOWN STRL REUS W/TWL LRG LVL3 (GOWN DISPOSABLE) ×3
GOWN STRL REUS W/TWL XL LVL3 (GOWN DISPOSABLE) ×1
IMPL BREAST SALINE 270CC (Breast) ×2 IMPLANT
IMPLANT BREAST SALINE 270CC (Breast) ×3 IMPLANT
KIT BASIN OR (CUSTOM PROCEDURE TRAY) ×3 IMPLANT
KIT ROOM TURNOVER OR (KITS) ×3 IMPLANT
MARKER SKIN DUAL TIP RULER LAB (MISCELLANEOUS) ×3 IMPLANT
NS IRRIG 1000ML POUR BTL (IV SOLUTION) ×6 IMPLANT
PACK GENERAL/GYN (CUSTOM PROCEDURE TRAY) ×3 IMPLANT
PAD ABD 8X10 STRL (GAUZE/BANDAGES/DRESSINGS) ×6 IMPLANT
PAD ARMBOARD 7.5X6 YLW CONV (MISCELLANEOUS) ×6 IMPLANT
PREFILTER EVAC NS 1 1/3-3/8IN (MISCELLANEOUS) ×3 IMPLANT
SET ASEPTIC TRANSFER (MISCELLANEOUS) ×3 IMPLANT
SIZER BREAST 325CC (SIZER) ×1
SIZER BRST P3.8-4.5XMDRT325CC (SIZER) ×2 IMPLANT
SOLUTION BETADINE 4OZ (MISCELLANEOUS) ×3 IMPLANT
SPECIMEN JAR LARGE (MISCELLANEOUS) IMPLANT
SPONGE GAUZE 4X4 12PLY STER LF (GAUZE/BANDAGES/DRESSINGS) IMPLANT
SPONGE LAP 18X18 X RAY DECT (DISPOSABLE) IMPLANT
STAPLER VISISTAT 35W (STAPLE) ×3 IMPLANT
STRIP CLOSURE SKIN 1/2X4 (GAUZE/BANDAGES/DRESSINGS) ×6 IMPLANT
SUT MNCRL AB 3-0 PS2 18 (SUTURE) ×12 IMPLANT
SUT MNCRL AB 4-0 PS2 18 (SUTURE) ×3 IMPLANT
SUT PDS 0 CT 1 18  CR/8 (SUTURE) IMPLANT
SUT PDS AB 2-0 CT1 27 (SUTURE) IMPLANT
SUT PDS AB 3-0 SH 27 (SUTURE) IMPLANT
SUT PROLENE 3 0 PS 2 (SUTURE) ×3 IMPLANT
SUT PROLENE 5 0 P 3 (SUTURE) IMPLANT
SUT VIC AB 2-0 FS1 27 (SUTURE) IMPLANT
SUT VIC AB 3-0 SH 18 (SUTURE) ×3 IMPLANT
SYR BULB IRRIGATION 50ML (SYRINGE) ×6 IMPLANT
TOWEL OR 17X24 6PK STRL BLUE (TOWEL DISPOSABLE) ×3 IMPLANT
TOWEL OR 17X26 10 PK STRL BLUE (TOWEL DISPOSABLE) ×3 IMPLANT
TRAY FOLEY CATH 16FRSI W/METER (SET/KITS/TRAYS/PACK) IMPLANT
TUBE CONNECTING 12X1/4 (SUCTIONS) ×3 IMPLANT
TUBING 1/4  WITH 7/8  ADAPTER (MISCELLANEOUS) ×3 IMPLANT
WATER STERILE IRR 1000ML POUR (IV SOLUTION) ×3 IMPLANT

## 2015-05-31 NOTE — Brief Op Note (Signed)
05/31/2015  9:31 AM  PATIENT:  [name] [name]  65 y.o. female  PRE-OPERATIVE DIAGNOSIS:  BREAST CANCER  POST-OPERATIVE DIAGNOSIS:  BREAST CANCER  PROCEDURE:  Procedure(s): REMOVAL RIGHT  BREAST IMPLANTS (Right) RIGHT BREAST RECONSTRUCTION WITH SALINE IMPLANTS  (Right) LEFT BREAST MASTOPEXY (Left)  SURGEON:  Surgeon(s) and Role:    * Crissie Reese, MD - Primary  PHYSICIAN ASSISTANT:   ASSISTANTS: Judyann Munson   ANESTHESIA:   general  EBL:  Total I/O In: -  Out: 25 [Blood:25]  BLOOD ADMINISTERED:none  DRAINS: (1) Jackson-Pratt drain(s) with closed bulb suction in the right chest   LOCAL MEDICATIONS USED:  NONE  SPECIMEN:  No Specimen  DISPOSITION OF SPECIMEN:  N/A  COUNTS:  YES  TOURNIQUET:  * No tourniquets in log *  DICTATION: .Other Dictation: Dictation Number [id number]  PLAN OF CARE: Admit for overnight observation  PATIENT DISPOSITION:  PACU - hemodynamically stable.   Delay start of Pharmacological VTE agent (>24hrs) due to surgical blood loss or risk of bleeding: no

## 2015-05-31 NOTE — H&P (Signed)
I have re-examined and re-evaluated the patient and there are no changes.  See office notes in paper chart for H&P. 

## 2015-05-31 NOTE — Transfer of Care (Signed)
Immediate Anesthesia Transfer of Care Note  Patient: [name] [name]  Procedure(s) Performed: Procedure(s): REMOVAL RIGHT  BREAST IMPLANTS (Right) RIGHT BREAST RECONSTRUCTION WITH SALINE IMPLANTS  (Right) LEFT BREAST MASTOPEXY (Left)  Patient Location: PACU  Anesthesia Type:General  Level of Consciousness: awake, alert , oriented, patient cooperative and responds to stimulation  Airway & Oxygen Therapy: Patient Spontanous Breathing and Patient connected to nasal cannula oxygen  Post-op Assessment: Report given to RN and Post -op Vital signs reviewed and stable  Post vital signs: Reviewed and stable  Last Vitals:  Filed Vitals:   05/31/15 0639  BP: 108/55  Pulse: 61  Temp: 36.6 C  Resp: 20    Complications: No apparent anesthesia complications

## 2015-05-31 NOTE — Anesthesia Postprocedure Evaluation (Signed)
Anesthesia Post Note  Patient: [name] [name]  Procedure(s) Performed: Procedure(s) (LRB): REMOVAL RIGHT  BREAST IMPLANTS (Right) RIGHT BREAST RECONSTRUCTION WITH SALINE IMPLANTS  (Right) LEFT BREAST MASTOPEXY (Left)  Patient location during evaluation: PACU Anesthesia Type: General Level of consciousness: awake and alert, oriented and patient cooperative Pain management: pain level controlled Vital Signs Assessment: post-procedure vital signs reviewed and stable Respiratory status: spontaneous breathing, nonlabored ventilation and respiratory function stable Cardiovascular status: blood pressure returned to baseline and stable Postop Assessment: no signs of nausea or vomiting Anesthetic complications: no    Last Vitals:  Filed Vitals:   05/31/15 1128 05/31/15 1129  BP:  101/56  Pulse: 62 60  Temp:    Resp: 26     Last Pain:  Filed Vitals:   05/31/15 1134  PainSc: Asleep                 Zayvion Stailey,E. Isaack Preble

## 2015-05-31 NOTE — Op Note (Signed)
[name] [name], [name] NO.:  [id number]  MEDICAL RECORD NO.:  [id number]  LOCATION:  [facility]                         FACILITY:  [facility]  PHYSICIAN:  Crissie Reese, M.D.     DATE OF BIRTH:  [birth year]  DATE OF PROCEDURE:  05/31/2015 DATE OF DISCHARGE:                              OPERATIVE REPORT   PREOPERATIVE DIAGNOSES: 1. History of right breast reconstruction for breast cancer. 2. Mechanical complication of implant device. 3. Glandular ptosis of left breast with asymmetry.  POSTOPERATIVE DIAGNOSES: 1. History of right breast reconstruction for breast cancer. 2. Mechanical complication of implant device. 3. Glandular ptosis of left breast with asymmetry.  PROCEDURES PERFORMED: 1. Removal of ruptured saline implant of right side distinct procedure. 2. Delayed breast reconstruction with saline implant of right side. 3. Mastopexy of left breast distinct procedure.  SURGEON:  Crissie Reese, M.D.  ASSISTANT:  Judyann Munson, RNFA.  ANESTHESIA:  General.  ESTIMATED BLOOD LOSS:  25 mL.  DRAINS:  One 19-French on the right.  CLINICAL NOTE:  This is a 65 year old woman, who has had a right breast cancer and had breast reconstruction 20 years ago.  She has done well, but recently noted a decrease in size on the right side.  This was consistent with a ruptured saline implant.  She did desire correction for this and also had developed as fairly significant glandular ptosis on the left side.  Planned procedures were discussed.  She understood that we did not have any records of the implant size on the right and therefore best estimate would be made.  The nature of the procedures and risks and possible complications were discussed with her and these risks include, not limited to, bleeding, infection, healing problems, scarring, loss of sensation and loss of sensation in the nipple, fluid accumulations, anesthesia related complications, pneumothorax, DVT, PE, failure  of device, capsular contracture, displacement device, wrinkles and ripples, asymmetry, chronic pain, and overall disappointment.  She understood all this and wished to proceed.  DESCRIPTION OF PROCEDURE:  The patient was marked in a full standing position in the holding area and then taken to the operating room and placed supine.  After successful induction of general anesthesia, she was prepped with ChloraPrep and draped with sterile drapes.  Right side was approached first to the old mastectomy scar and then a muscle- splitting incision was used to access the underlying ruptured implant which was removed.  The submuscular space was then thoroughly irrigated with saline.  Capsulotomy performed as needed.  The skin was prepped with Betadine and the Sizer was soaked in antibiotic solution.  The Sizer was positioned and it was filled to 325 mL with air.  This seemed to give nice symmetry with the opposite side.  The Sizer was removed. The space again irrigated with antibiotic solution, which was allowed to dwell in the space.  After thoroughly cleaning gloves, the implant was prepared.  This was a Product manager high-profile 325 mL maximum fill smooth wall saline implant.  A 100 mL sterile saline placed using a closed filling system, it was returned to the antibiotic solution.  The space was again inspected and found to be in good hemostasis.  A  19-French drain was positioned, brought through separate stab wound inferolaterally and secured with 3-0 Prolene suture.  After thoroughly cleaning gloves following re-prepped the skin with Betadine around the incision, the antibiotic solution was again placed in the space and the implant was positioned.  A 3-0 Vicryl sutures were then pre-placed and left and tied with great care taken to avoid damage to underlying implant, which was kept under direct vision all times.  The implant was filled with the maximum 325 mL using sterile saline via a closed  filling system.  The fill tubing was removed and the port was closed. Antibiotic solution placed in the 3-0 Vicryl sutures tied securely.  A 3- 0 Monocryl interrupted inverted deep dermal sutures, running 3-0 Monocryl subcuticular suture completed the closure.  Attention was then directed to the left side where the measurement was taken from the nipple down 7 cm.  The intervening skin between that point and the inframammary fold that will be marked in the holding area was then marked and was removed.  Hemostasis with the electrocautery. Irrigation with saline and then the closure with 3-0 Monocryl interrupted inverted deep dermal sutures, running 4 Monocryl subcuticular suture.  This seem to give her a nice symmetry in terms of positioning.  The Dermabond was applied, dry sterile dressings, Biopatch and SorbaView around the drain and she was placed in the breast binder and transferred to recovery room in stable having tolerated procedure well.  DISPOSITION:  She will be observed overnight.     Crissie Reese, M.D.     DB/MEDQ  D:  05/31/2015  T:  05/31/2015  Job:  [id number]

## 2015-05-31 NOTE — Anesthesia Preprocedure Evaluation (Addendum)
Anesthesia Evaluation  Patient identified by MRN, date of birth, ID band Patient awake    Reviewed: Allergy & Precautions, H&P , NPO status , Patient's Chart, lab work & pertinent test results  Airway Mallampati: II  TM Distance: >3 FB Neck ROM: full    Dental  (+) Dental Advisory Given, Caps 2 front upper capped:   Pulmonary neg pulmonary ROS,    Pulmonary exam normal breath sounds clear to auscultation       Cardiovascular +CHF  Normal cardiovascular exam+ Cardiac Defibrillator  Rhythm:regular Rate:Normal  EF 25%   Neuro/Psych CVA, No Residual Symptoms negative neurological ROS  negative psych ROS   GI/Hepatic negative GI ROS, Neg liver ROS,   Endo/Other  negative endocrine ROS  Renal/GU negative Renal ROS  negative genitourinary   Musculoskeletal   Abdominal   Peds  Hematology negative hematology ROS (+)   Anesthesia Other Findings   Reproductive/Obstetrics negative OB ROS                          Anesthesia Physical Anesthesia Plan  ASA: IV  Anesthesia Plan: General   Post-op Pain Management:    Induction: Intravenous  Airway Management Planned: Oral ETT  Additional Equipment:   Intra-op Plan:   Post-operative Plan: Extubation in OR  Informed Consent: I have reviewed the patients History and Physical, chart, labs and discussed the procedure including the risks, benefits and alternatives for the proposed anesthesia with the patient or authorized representative who has indicated his/her understanding and acceptance.   Dental Advisory Given  Plan Discussed with: CRNA and Surgeon  Anesthesia Plan Comments:        Anesthesia Quick Evaluation

## 2015-06-01 ENCOUNTER — Encounter (HOSPITAL_COMMUNITY): Payer: Self-pay | Admitting: Plastic Surgery

## 2015-06-01 DIAGNOSIS — T8549XA Other mechanical complication of breast prosthesis and implant, initial encounter: Secondary | ICD-10-CM | POA: Diagnosis not present

## 2015-06-01 MED ORDER — HYDROMORPHONE HCL 2 MG PO TABS: 2.0000 mg | ORAL_TABLET | ORAL | Status: DC | PRN

## 2015-06-01 MED ORDER — METHOCARBAMOL 500 MG PO TABS: 500.0000 mg | ORAL_TABLET | Freq: Four times a day (QID) | ORAL | Status: DC

## 2015-06-01 MED ORDER — DOXYCYCLINE HYCLATE 100 MG PO TABS
100.0000 mg | ORAL_TABLET | Freq: Two times a day (BID) | ORAL | Status: DC
  Administered 2015-06-01: 100 mg via ORAL
  Filled 2015-06-01: qty 1

## 2015-06-01 MED ORDER — DOXYCYCLINE HYCLATE 100 MG PO TABS: 100.0000 mg | ORAL_TABLET | Freq: Two times a day (BID) | ORAL | Status: DC

## 2015-06-01 MED ORDER — DOCUSATE SODIUM 100 MG PO CAPS: 100.0000 mg | ORAL_CAPSULE | Freq: Every day | ORAL | Status: DC

## 2015-06-01 MED ORDER — ENOXAPARIN SODIUM 100 MG/ML ~~LOC~~ SOLN: 100.0000 mg | SUBCUTANEOUS | Status: DC

## 2015-06-01 NOTE — Discharge Instructions (Addendum)
No lifting for 6 weeks No vigorous activity for 6 weeks (including outdoor walks) No driving for 4 weeks OK to walk up stairs slowly No lifting or exercising or walking outdoors Stay propped up Use incentive spirometer at home every hour while awake No shower while drains are in place Empty drains at least three times a day and record the amounts separately Change drain dressings every third day if instructed to do so by Dr. Harlow Mares  Apply Bacitracin antibiotic ointment to the drain sites  Place gauze dressing over drains  Secure the gauze with tape Take an over-the-counter Probiotic while on antibiotics Take an over-the-counter stool softener (such as Colace) while on pain medication See Dr. Harlow Mares in his office next week For questions call [phone] or [phone]

## 2015-06-01 NOTE — Discharge Summary (Signed)
Physician Discharge Summary  Patient ID: [name] [name] MRN: [id number] DOB/AGE: Jun 25, 1950 65 y.o.  Admit date: 05/31/2015 Discharge date: 06/01/2015  Admission Diagnoses: History of breast reconstruction with mechanical complication of prosthetic device  Discharge Diagnoses: Same Active Problems:   History of reconstruction of right breast   Discharged Condition: good  Hospital Course: On the day of admission the patient was taken to surgery and had right removal of ruptured breast implant and reconstruction of right breast with 325 cc smooth saline implant ([name]) and left mastopexy. The patient tolerated the procedures well. Postoperatively, she did well other than some nausea, which has now resolved. Her defibrillator has been turned back on by the rep. The patient was ambulatory and tolerating diet on the first postoperative day. She is ready for discharge. She was continued on full anticoagulation with Lovenox as recommended by her cardiologist due to her history of embolic stroke.  Treatments: antibiotics: Ancef, anticoagulation: LMW heparin and surgery: right removal ruptured implant and reconstruction right breast with saline implant. Left mastopexy.  Discharge Exam: Blood pressure 95/49, pulse 61, temperature 97.9 F (36.6 C), temperature source Oral, resp. rate 18, height 5\' 8"  (1.727 m), weight 163 lb 3.2 oz (74.027 kg), SpO2 97 %.  Operative sites: Implant appears to be in good position Drains functioning. Drainage thin. There is no evidence of bleeding or infection either side.  Disposition: Discharge home     Medication List    STOP taking these medications        Biotin 10 MG Tabs     CINNAMON PO     COQ10 PO     omega-3 acid ethyl esters 1 g capsule  Commonly known as:  LOVAZA     warfarin 5 MG tablet  Commonly known as:  COUMADIN      TAKE these medications        digoxin 0.125 MG tablet  Commonly known as:  LANOXIN  Take 1 tablet (125 mcg  total) by mouth daily.     docusate sodium 100 MG capsule  Commonly known as:  COLACE  Take 1 capsule (100 mg total) by mouth daily.     doxycycline 100 MG tablet  Commonly known as:  VIBRA-TABS  Take 1 tablet (100 mg total) by mouth every 12 (twelve) hours.     enoxaparin 100 MG/ML injection  Commonly known as:  LOVENOX  Inject 100 mg into the skin daily.     enoxaparin 100 MG/ML injection  Commonly known as:  LOVENOX  Inject 1 mL (100 mg total) into the skin daily.     furosemide 40 MG tablet  Commonly known as:  LASIX  Take 40 mg by mouth daily as needed for fluid or edema.     HYDROmorphone 2 MG tablet  Commonly known as:  DILAUDID  Take 1-2 tablets (2-4 mg total) by mouth every 4 (four) hours as needed for moderate pain or severe pain.     lisinopril 10 MG tablet  Commonly known as:  PRINIVIL,ZESTRIL  TAKE 1 BY MOUTH TWICE A DAY     methocarbamol 500 MG tablet  Commonly known as:  ROBAXIN  Take 1 tablet (500 mg total) by mouth 4 (four) times daily.     metoprolol succinate 50 MG 24 hr tablet  Commonly known as:  TOPROL-XL  TAKE 1 BY MOUTH IN THE MORNING AND HALF TABLET IN EVENING     multivitamin with minerals Tabs tablet  Take 1 tablet by mouth daily.  spironolactone 25 MG tablet  Commonly known as:  ALDACTONE  Take 0.5 tablets (12.5 mg total) by mouth daily.         SignedHarlow Mares, Judith Campillo M 06/01/2015, 8:07 AM

## 2015-06-01 NOTE — Discharge Planning (Signed)
AVS and pain rx to patient who verbalizes understanding. JP care reviewed with patient, able to demonstrate.  Dc'd to private car home per w/c with all personal belongings accompanied by husband at 4.

## 2015-06-11 NOTE — Progress Notes (Signed)
.  HPI The patient has a history of a nonischemic cardiomyopathy. Her ejection fraction was at one point in time her EF was 15 - 20%. The most recent ejection fraction was about 40% in 2015.   Most recently the EF was 25% again.  The patient denies any new symptoms such as chest discomfort, neck or arm discomfort. There has been no new PND or orthopnea. There have been no reported palpitations, presyncope or syncope.  Of note she has a strong family history of dilated cardiomyopathy. Her brother has a heart transplant.  Her other brother is being considered for an LVAD. At the last visit I increased her lisinopril.    At the last visit I increased her beta blocker.  She was cleared for breast surgery.  She did OK with this.   No Known Allergies  Current Outpatient Prescriptions  Medication Sig Dispense Refill  . digoxin (LANOXIN) 0.125 MG tablet Take 1 tablet (125 mcg total) by mouth daily. 90 tablet 3  . enoxaparin (LOVENOX) 100 MG/ML injection Inject 1 mL (100 mg total) into the skin daily. 10 Syringe 0  . furosemide (LASIX) 40 MG tablet Take 40 mg by mouth daily as needed for fluid or edema.     Marland Kitchen lisinopril (PRINIVIL,ZESTRIL) 10 MG tablet TAKE 1 BY MOUTH TWICE A DAY (Patient taking differently: Take 10 mg by mouth 2 (two) times daily. TAKE 1 BY MOUTH TWICE A DAY) 60 tablet 0  . metoprolol succinate (TOPROL-XL) 50 MG 24 hr tablet TAKE 1 BY MOUTH IN THE MORNING AND HALF TABLET IN EVENING (Patient taking differently: Take 25-50 mg by mouth 2 (two) times daily. TAKE 1 BY MOUTH IN THE MORNING AND HALF TABLET IN EVENING) 135 tablet 3  . Multiple Vitamin (MULTIVITAMIN WITH MINERALS) TABS tablet Take 1 tablet by mouth daily.    Marland Kitchen spironolactone (ALDACTONE) 25 MG tablet Take 0.5 tablets (12.5 mg total) by mouth daily. 90 tablet 3   No current facility-administered medications for this visit.    Past Medical History  Diagnosis Date  . Breast cancer ([place])   . Cerebrovascular disease     s/p  right MCA cardioemoblic infarct [id number]  . Nonischemic cardiomyopathy ([facility])     a. +LMNA mutation b. s/p STJ dual chamber ICD  . CHF (congestive heart failure) ([place])   . Arthritis     thumbs    Past Surgical History  Procedure Laterality Date  . Cardiac defibrillator placement  2010    STJ dual chamber ICD implanted for NICM by Dr Caryl Comes  . Cholecystectomy    . Abdominal hysterectomy    . Mastectomy Right   . Varicose vein surgery Left   . Tonsillectomy    . Colonoscopy    . Esophagogastroduodenoscopy    . Breast reconstruction Right 1995  . Breast implant removal Right 05/31/2015    Procedure: REMOVAL RIGHT  BREAST IMPLANTS;  Surgeon: Crissie Reese, MD;  Location: [place];  Service: Plastics;  Laterality: Right;  . Breast reconstruction Right 05/31/2015    Procedure: RIGHT BREAST RECONSTRUCTION WITH SALINE IMPLANTS ;  Surgeon: Crissie Reese, MD;  Location: [place];  Service: Plastics;  Laterality: Right;  . Mastopexy Left 05/31/2015    Procedure: LEFT BREAST MASTOPEXY;  Surgeon: Crissie Reese, MD;  Location: [place];  Service: Plastics;  Laterality: Left;    ROS:  As stated in the HPI and negative for all other systems.  PHYSICAL EXAM BP 104/62 mmHg  Pulse 76  Ht 5'  8" (1.727 m)  Wt 162 lb 2 oz (73.539 kg)  BMI 24.66 kg/m2 GENERAL:  Well appearing NECK:  No jugular venous distention, waveform within normal limits, carotid upstroke brisk and symmetric, no bruits, no thyromegaly LUNGS:  Clear to auscultation bilaterally BACK:  No CVA tenderness CHEST:  Well healed ICD pocket HEART:  PMI not displaced or sustained,S1 and S2 within normal limits, no S3, no S4, no clicks, no rubs, no murmurs ABD:  Flat, positive bowel sounds normal in frequency in pitch, no bruits, no rebound, no guarding, no midline pulsatile mass, no hepatomegaly, no splenomegaly EXT:  2 plus pulses throughout, no edema, no cyanosis no clubbing  ASSESSMENT AND PLAN  CARDIOMYOPATHY, PRIMARY, DILATED - Today I will  stop the lisinopril and start Entresto 24/26.  I will titrate as tolerated.   I talked to her today about genetic testing with the familial cardiomyopathy.  She wants to talk to Dr. Caryl Comes about this more.    I will send a message to him.    Implantable defibrillator -  She follows in our defibrillator clinic.   CVA -  History of CVA felt to be cardioembolic in nature. Continue Coumadin.

## 2015-06-12 ENCOUNTER — Encounter: Payer: Self-pay | Admitting: Cardiology

## 2015-06-12 ENCOUNTER — Ambulatory Visit (INDEPENDENT_AMBULATORY_CARE_PROVIDER_SITE_OTHER): Payer: BLUE CROSS/BLUE SHIELD | Admitting: Cardiology

## 2015-06-12 ENCOUNTER — Encounter: Payer: Self-pay | Admitting: Nurse Practitioner

## 2015-06-12 VITALS — BP 104/62 | HR 76 | Ht 68.0 in | Wt 162.1 lb

## 2015-06-12 DIAGNOSIS — I420 Dilated cardiomyopathy: Secondary | ICD-10-CM | POA: Diagnosis not present

## 2015-06-12 MED ORDER — SACUBITRIL-VALSARTAN 24-26 MG PO TABS
1.0000 | ORAL_TABLET | Freq: Two times a day (BID) | ORAL | Status: DC
Start: 2015-06-12 — End: 2015-10-18

## 2015-06-12 NOTE — Patient Instructions (Signed)
Medication Instructions:  STOP Lisinopril 36 hrs before STARTING Entresto 24/26 mg twice a day  Labwork: NONE  Testing/Procedures: NONE  Follow-Up: 1 Month  Any Other Special Instructions Will Be Listed Below (If Applicable).   If you need a refill on your cardiac medications before your next appointment, please call your pharmacy.

## 2015-06-12 NOTE — Progress Notes (Signed)
Electrophysiology Office Note Date: 06/13/2015  ID:  [name], [name] Jul 09, 1950, MRN [id number]  PCP: Gar Ponto, MD Primary Cardiologist: [place] Electrophysiologist: Caryl Comes  CC: Routine ICD follow-up  [name] [name] is a 65 y.o. female seen today for Dr Caryl Comes.  She presents today for routine electrophysiology followup.  Since last being seen in our clinic, the patient reports doing very well. She denies chest pain, palpitations, dyspnea, PND, orthopnea, nausea, vomiting, dizziness, syncope, edema, weight gain, or early satiety.  She has not had ICD shocks.   Echo 03/2015 demonstrated EF [id number], grade 1 diastolic dysfunction, mild MR  Device History: STJ dual chamber ICD implanted 2010 for NICM; +LMNA gene mutation History of appropriate therapy: No History of AAD therapy: No   Past Medical History  Diagnosis Date  . Breast cancer ([place])   . Cerebrovascular disease     s/p right MCA cardioemoblic infarct [id number]  . Nonischemic cardiomyopathy ([facility])     a. +LMNA mutation b. s/p STJ dual chamber ICD  . CHF (congestive heart failure) ([place])   . Arthritis     thumbs   Past Surgical History  Procedure Laterality Date  . Cardiac defibrillator placement  2010    STJ dual chamber ICD implanted for NICM by Dr Caryl Comes  . Cholecystectomy    . Abdominal hysterectomy    . Mastectomy Right   . Varicose vein surgery Left   . Tonsillectomy    . Colonoscopy    . Esophagogastroduodenoscopy    . Breast reconstruction Right 1995  . Breast implant removal Right 05/31/2015    Procedure: REMOVAL RIGHT  BREAST IMPLANTS;  Surgeon: Crissie Reese, MD;  Location: [place];  Service: Plastics;  Laterality: Right;  . Breast reconstruction Right 05/31/2015    Procedure: RIGHT BREAST RECONSTRUCTION WITH SALINE IMPLANTS ;  Surgeon: Crissie Reese, MD;  Location: [place];  Service: Plastics;  Laterality: Right;  . Mastopexy Left 05/31/2015    Procedure: LEFT BREAST MASTOPEXY;  Surgeon: Crissie Reese,  MD;  Location: [place];  Service: Plastics;  Laterality: Left;    Current Outpatient Prescriptions  Medication Sig Dispense Refill  . metoprolol succinate (TOPROL-XL) 50 MG 24 hr tablet Take 50 mg by mouth daily. Take one tablet in the morning and one half tablet in the evening  2  . digoxin (LANOXIN) 0.125 MG tablet Take 1 tablet (125 mcg total) by mouth daily. 90 tablet 3  . enoxaparin (LOVENOX) 100 MG/ML injection Inject 1 mL (100 mg total) into the skin daily. 10 Syringe 0  . furosemide (LASIX) 40 MG tablet Take 40 mg by mouth daily as needed for fluid or edema.     . Multiple Vitamin (MULTIVITAMIN WITH MINERALS) TABS tablet Take 1 tablet by mouth daily.    . sacubitril-valsartan (ENTRESTO) 24-26 MG Take 1 tablet by mouth 2 (two) times daily. 60 tablet 6  . spironolactone (ALDACTONE) 25 MG tablet Take 0.5 tablets (12.5 mg total) by mouth daily. 90 tablet 3   No current facility-administered medications for this visit.    Allergies:   Review of patient's allergies indicates no known allergies.   Social History: Social History   Social History  . Marital Status: Married    Spouse Name: N/A  . Number of Children: N/A  . Years of Education: N/A   Occupational History  . Not on file.   Social History Main Topics  . Smoking status: Never Smoker   . Smokeless tobacco: Never Used  . Alcohol  Use: No  . Drug Use: No  . Sexual Activity: Not on file   Other Topics Concern  . Not on file   Social History Narrative    Family History: Family History  Problem Relation Age of Onset  . Cardiomyopathy      family hx of  . Other Brother     s/p heart transplant  . Stroke      family hx of    Review of Systems: All other systems reviewed and are otherwise negative except as noted above.   Physical Exam: VS:  BP 115/68 mmHg  Pulse 61  Ht 5\' 8"  (1.727 m)  Wt 164 lb 6.4 oz (74.571 kg)  BMI 25.00 kg/m2 , BMI Body mass index is 25 kg/(m^2).  GEN- The patient is well  appearing, alert and oriented x 3 today.   HEENT: normocephalic, atraumatic; sclera clear, conjunctiva pink; hearing intact; oropharynx clear; neck supple  Lungs- Clear to ausculation bilaterally, normal work of breathing.  No wheezes, rales, rhonchi Heart- Regular rate and rhythm, no murmurs, rubs or gallops  GI- soft, non-tender, non-distended, bowel sounds present  Extremities- no clubbing, cyanosis, or edema; DP/PT/radial pulses 2+ bilaterally MS- no significant deformity or atrophy Skin- warm and dry, no rash or lesion; ICD pocket well healed Psych- euthymic mood, full affect Neuro- strength and sensation are intact  ICD interrogation- reviewed in detail today,  See PACEART report  EKG:  EKG is ordered today. The ekg ordered today shows atrial pacing with intrinsic ventricular conduction  Recent Labs: 05/29/2015: BUN 14; Creatinine, Ser 0.74; Hemoglobin 12.7; Platelets 175; Potassium 3.8; Sodium 140   Wt Readings from Last 3 Encounters:  06/13/15 164 lb 6.4 oz (74.571 kg)  06/12/15 162 lb 2 oz (73.539 kg)  05/31/15 163 lb 3.2 oz (74.027 kg)     Other studies Reviewed: Additional studies/ records that were reviewed today include: Dr Percival Spanish and Dr Olin Pia office notes  Assessment and Plan:  1.  Chronic systolic dysfunction euvolemic today Stable on an appropriate medical regimen - Dr Percival Spanish is considering starting Entresto Normal ICD function See Claudia Desanctis Art report No changes today Device is at [facility].  Risks, benefits to generator change discussed with patient today. Risks, benefits to generator change discussed with patient today who would like to proceed. With prior CVA, will need to do gen change therapeutic on Coumadin. Please do not hold Warfarin for procedure.   2.  Prior CVA Continue Warfarin Will do gen change with therapeutic INR as above INR followed by [facility] - Elberta Leatherwood, PharmD discussed dosing of Lovenox/Warfarin with patient today post breast surgery Recent CBC  stable  Current medicines are reviewed at length with the patient today.   The patient does not have concerns regarding her medicines.  The following changes were made today:  none  Labs/ tests ordered today include: none   Disposition:   Follow up with Dr Percival Spanish as scheduled, Dr Caryl Comes after gen change    Signed, Chanetta Marshall, NP 06/13/2015 10:26 AM  [facility] [address] [place] [address] [phone] (office) [phone] (fax)

## 2015-06-13 ENCOUNTER — Encounter: Payer: Self-pay | Admitting: *Deleted

## 2015-06-13 ENCOUNTER — Telehealth: Payer: Self-pay | Admitting: *Deleted

## 2015-06-13 ENCOUNTER — Ambulatory Visit (INDEPENDENT_AMBULATORY_CARE_PROVIDER_SITE_OTHER): Payer: BLUE CROSS/BLUE SHIELD | Admitting: Nurse Practitioner

## 2015-06-13 ENCOUNTER — Encounter: Payer: Self-pay | Admitting: Internal Medicine

## 2015-06-13 ENCOUNTER — Encounter: Payer: Self-pay | Admitting: Nurse Practitioner

## 2015-06-13 VITALS — BP 115/68 | HR 61 | Ht 68.0 in | Wt 164.4 lb

## 2015-06-13 DIAGNOSIS — I6340 Cerebral infarction due to embolism of unspecified cerebral artery: Secondary | ICD-10-CM

## 2015-06-13 DIAGNOSIS — I5022 Chronic systolic (congestive) heart failure: Secondary | ICD-10-CM | POA: Diagnosis not present

## 2015-06-13 LAB — CUP PACEART INCLINIC DEVICE CHECK
Implantable Lead Implant Date: 20100416
Implantable Lead Location: 753859
Implantable Lead Model: 7122
MDC IDC LEAD IMPLANT DT: 20100416
MDC IDC LEAD LOCATION: 753860
MDC IDC SESS DTM: 20170503102944
Pulse Gen Serial Number: 468775

## 2015-06-13 MED ORDER — ENOXAPARIN SODIUM 100 MG/ML ~~LOC~~ SOLN: 100.0000 mg | SUBCUTANEOUS | Status: DC

## 2015-06-13 NOTE — Telephone Encounter (Signed)
Received a call from [facility], cover my meds, stating that you can call [phone] if you would like assistance in completing the patients prior auth for entresto.

## 2015-06-13 NOTE — Patient Instructions (Addendum)
Medication Instructions:  06/13/15- Lovenox 100mg  once 06/14/15- Lovenox 100mg  once AND Coumadin 1 1/2 tablets 06/15/15- Lovenox 100mg  once AND Coumadin 1 tablet  06/16/15- Lovenox 100mg  once AND Coumadin 1 1/2 tablets  06/17/15- Lovenox 100mg  once AND Coumadin 1 tablet  06/18/15- Check INR in [place] office at 2:50 pm  [place] AS PRESCRIBED   If you need a refill on your cardiac medications before your next appointment, please call your pharmacy.  Labwork: RETURN FOR LAB WORK ON 08/02/15.Marland Kitchen   Testing/Procedures: SEE LETTER FOR ICD GEN CHANGE ON 08/06/15   Follow-Up: 10 DAY WOUND  CHECK  AFTER 08/06/15.Marland Kitchen WITH DEVICE CLINIC    91 DAYS AFTER 08/06/15..... PHY DEFIB CK WITH DR Lequita Halt    Any Other Special Instructions Will Be Listed Below (If Applicable).

## 2015-06-13 NOTE — Addendum Note (Signed)
Addended by: Elberta Leatherwood R on: 06/13/2015 10:41 AM   Modules accepted: Orders

## 2015-06-14 NOTE — Telephone Encounter (Signed)
Spoke with patient today. Advised her that we haven't received anything about a PA for Entresto. I called Rx Cover My Meds, and advised them to call Dr. Rosezella [place] office.

## 2015-06-15 ENCOUNTER — Telehealth: Payer: Self-pay | Admitting: Cardiology

## 2015-06-15 NOTE — Telephone Encounter (Signed)
New message      Talk to the nurse regarding entresto prior authorization

## 2015-06-18 ENCOUNTER — Ambulatory Visit (INDEPENDENT_AMBULATORY_CARE_PROVIDER_SITE_OTHER): Payer: BLUE CROSS/BLUE SHIELD | Admitting: *Deleted

## 2015-06-18 ENCOUNTER — Telehealth: Payer: Self-pay | Admitting: *Deleted

## 2015-06-18 DIAGNOSIS — I6350 Cerebral infarction due to unspecified occlusion or stenosis of unspecified cerebral artery: Secondary | ICD-10-CM

## 2015-06-18 DIAGNOSIS — Z7901 Long term (current) use of anticoagulants: Secondary | ICD-10-CM | POA: Diagnosis not present

## 2015-06-18 LAB — POCT INR: INR: 1.2

## 2015-06-18 NOTE — Telephone Encounter (Signed)
Prior Auth form faxed to [employer] for [employer]

## 2015-06-21 ENCOUNTER — Ambulatory Visit (INDEPENDENT_AMBULATORY_CARE_PROVIDER_SITE_OTHER): Payer: BLUE CROSS/BLUE SHIELD | Admitting: *Deleted

## 2015-06-21 DIAGNOSIS — I6350 Cerebral infarction due to unspecified occlusion or stenosis of unspecified cerebral artery: Secondary | ICD-10-CM | POA: Diagnosis not present

## 2015-06-21 DIAGNOSIS — Z7901 Long term (current) use of anticoagulants: Secondary | ICD-10-CM | POA: Diagnosis not present

## 2015-06-21 LAB — POCT INR: INR: 1.9

## 2015-07-05 ENCOUNTER — Ambulatory Visit (INDEPENDENT_AMBULATORY_CARE_PROVIDER_SITE_OTHER): Payer: BLUE CROSS/BLUE SHIELD | Admitting: *Deleted

## 2015-07-05 DIAGNOSIS — I6350 Cerebral infarction due to unspecified occlusion or stenosis of unspecified cerebral artery: Secondary | ICD-10-CM | POA: Diagnosis not present

## 2015-07-05 DIAGNOSIS — Z7901 Long term (current) use of anticoagulants: Secondary | ICD-10-CM | POA: Diagnosis not present

## 2015-07-05 LAB — POCT INR: INR: 2.4

## 2015-07-12 ENCOUNTER — Encounter: Payer: Self-pay | Admitting: Cardiology

## 2015-07-17 ENCOUNTER — Ambulatory Visit (INDEPENDENT_AMBULATORY_CARE_PROVIDER_SITE_OTHER): Payer: BLUE CROSS/BLUE SHIELD | Admitting: Cardiology

## 2015-07-17 ENCOUNTER — Encounter: Payer: Self-pay | Admitting: Cardiology

## 2015-07-17 VITALS — BP 100/58 | HR 77 | Ht 68.0 in | Wt 166.0 lb

## 2015-07-17 DIAGNOSIS — I5022 Chronic systolic (congestive) heart failure: Secondary | ICD-10-CM | POA: Diagnosis not present

## 2015-07-17 NOTE — Progress Notes (Signed)
.  HPI The patient has a history of a nonischemic cardiomyopathy. Her ejection fraction was at one point in time her EF was 15 - 20%. The most recent ejection fraction was about 40% in 2015.   Most recently the EF was 25% again.  The patient denies any new symptoms such as chest discomfort, neck or arm discomfort. There has been no new PND or orthopnea. There have been no reported palpitations, presyncope or syncope.  Of note she has a strong family history of dilated cardiomyopathy. Her brother has a heart transplant.  Her other brother is being considered for an LVAD.   At the last visit I stopped her ACE inhibitor and changed to [facility].  She seems to have tolerated this with mild light headedness.    No Known Allergies  Current Outpatient Prescriptions  Medication Sig Dispense Refill  . digoxin (LANOXIN) 0.125 MG tablet Take 1 tablet (125 mcg total) by mouth daily. 90 tablet 3  . furosemide (LASIX) 40 MG tablet Take 40 mg by mouth daily as needed for fluid or edema.     . metoprolol succinate (TOPROL-XL) 50 MG 24 hr tablet Take 50 mg by mouth daily. Take one tablet in the morning and one half tablet in the evening  2  . Multiple Vitamin (MULTIVITAMIN WITH MINERALS) TABS tablet Take 1 tablet by mouth daily.    . sacubitril-valsartan (ENTRESTO) 24-26 MG Take 1 tablet by mouth 2 (two) times daily. 60 tablet 6  . spironolactone (ALDACTONE) 25 MG tablet Take 0.5 tablets (12.5 mg total) by mouth daily. 90 tablet 3   No current facility-administered medications for this visit.    Past Medical History  Diagnosis Date  . Breast cancer ([place])   . Cerebrovascular disease     s/p right MCA cardioemoblic infarct A999333  . Nonischemic cardiomyopathy ([facility])     a. +LMNA mutation b. s/p STJ dual chamber ICD  . CHF (congestive heart failure) ([place])   . Arthritis     thumbs    Past Surgical History  Procedure Laterality Date  . Cardiac defibrillator placement  2010    STJ dual chamber ICD  implanted for NICM by Dr Caryl Comes  . Cholecystectomy    . Abdominal hysterectomy    . Mastectomy Right   . Varicose vein surgery Left   . Tonsillectomy    . Colonoscopy    . Esophagogastroduodenoscopy    . Breast reconstruction Right 1995  . Breast implant removal Right 05/31/2015    Procedure: REMOVAL RIGHT  BREAST IMPLANTS;  Surgeon: Crissie Reese, MD;  Location: [place];  Service: Plastics;  Laterality: Right;  . Breast reconstruction Right 05/31/2015    Procedure: RIGHT BREAST RECONSTRUCTION WITH SALINE IMPLANTS ;  Surgeon: Crissie Reese, MD;  Location: [place];  Service: Plastics;  Laterality: Right;  . Mastopexy Left 05/31/2015    Procedure: LEFT BREAST MASTOPEXY;  Surgeon: Crissie Reese, MD;  Location: [place];  Service: Plastics;  Laterality: Left;    ROS:  As stated in the HPI and negative for all other systems.  PHYSICAL EXAM BP 100/58 mmHg  Pulse 77  Ht 5\' 8"  (1.727 m)  Wt 166 lb (75.297 kg)  BMI 25.25 kg/m2 GENERAL:  Well appearing NECK:  No jugular venous distention, waveform within normal limits, carotid upstroke brisk and symmetric, no bruits, no thyromegaly LUNGS:  Clear to auscultation bilaterally BACK:  No CVA tenderness CHEST:  Well healed ICD pocket HEART:  PMI not displaced or sustained,S1 and S2  within normal limits, no S3, no S4, no clicks, no rubs, no murmurs ABD:  Flat, positive bowel sounds normal in frequency in pitch, no bruits, no rebound, no guarding, no midline pulsatile mass, no hepatomegaly, no splenomegaly EXT:  2 plus pulses throughout, no edema, no cyanosis no clubbing  ASSESSMENT AND PLAN  CARDIOMYOPATHY, PRIMARY, DILATED - She tolerated the current dose of Entresto.  I will not titrate this with her reduced BP.  I have asked her to discuss the results of her genetic testing and any further suggestions for meeting with the geneticist or having other family members tested.   Implantable defibrillator -  She follows in our defibrillator clinic.   She will  have a generator change later this month.    CVA -  History of CVA felt to be cardioembolic in nature. Continue Coumadin.

## 2015-07-17 NOTE — Patient Instructions (Signed)
Your physician recommends that you schedule a follow-up appointment in: 3 Months  

## 2015-08-02 ENCOUNTER — Other Ambulatory Visit (INDEPENDENT_AMBULATORY_CARE_PROVIDER_SITE_OTHER): Payer: BLUE CROSS/BLUE SHIELD | Admitting: *Deleted

## 2015-08-02 ENCOUNTER — Ambulatory Visit (INDEPENDENT_AMBULATORY_CARE_PROVIDER_SITE_OTHER): Payer: BLUE CROSS/BLUE SHIELD | Admitting: *Deleted

## 2015-08-02 DIAGNOSIS — I6350 Cerebral infarction due to unspecified occlusion or stenosis of unspecified cerebral artery: Secondary | ICD-10-CM

## 2015-08-02 DIAGNOSIS — Z7901 Long term (current) use of anticoagulants: Secondary | ICD-10-CM | POA: Diagnosis not present

## 2015-08-02 DIAGNOSIS — I5022 Chronic systolic (congestive) heart failure: Secondary | ICD-10-CM | POA: Diagnosis not present

## 2015-08-02 DIAGNOSIS — I6340 Cerebral infarction due to embolism of unspecified cerebral artery: Secondary | ICD-10-CM | POA: Diagnosis not present

## 2015-08-02 LAB — PROTIME-INR
INR: 2.6 — ABNORMAL HIGH
Prothrombin Time: 26.8 s — ABNORMAL HIGH (ref 9.0–11.5)

## 2015-08-02 LAB — CBC
HCT: 36.3 % (ref 35.0–45.0)
Hemoglobin: 12.2 g/dL (ref 11.7–15.5)
MCH: 29.3 pg (ref 27.0–33.0)
MCHC: 33.6 g/dL (ref 32.0–36.0)
MCV: 87.1 fL (ref 80.0–100.0)
MPV: 12.6 fL — AB (ref 7.5–12.5)
PLATELETS: 206 10*3/uL (ref 140–400)
RBC: 4.17 MIL/uL (ref 3.80–5.10)
RDW: 14.7 % (ref 11.0–15.0)
WBC: 7.4 10*3/uL (ref 3.8–10.8)

## 2015-08-02 LAB — BASIC METABOLIC PANEL
BUN: 17 mg/dL (ref 7–25)
CALCIUM: 9.1 mg/dL (ref 8.6–10.4)
CO2: 27 mmol/L (ref 20–31)
Chloride: 104 mmol/L (ref 98–110)
Creat: 0.77 mg/dL (ref 0.50–0.99)
GLUCOSE: 94 mg/dL (ref 65–99)
Potassium: 4.2 mmol/L (ref 3.5–5.3)
Sodium: 138 mmol/L (ref 135–146)

## 2015-08-02 LAB — POCT INR: INR: 3

## 2015-08-02 NOTE — Addendum Note (Signed)
Addended by: Eulis Foster on: 08/02/2015 10:03 AM   Modules accepted: Orders

## 2015-08-03 ENCOUNTER — Telehealth: Payer: Self-pay | Admitting: *Deleted

## 2015-08-03 NOTE — Telephone Encounter (Signed)
-----   Message from Patsey Berthold, NP sent at 08/02/2015  6:49 PM EDT ----- Please notify patient of stable pre-procedure labs

## 2015-08-06 ENCOUNTER — Encounter (HOSPITAL_COMMUNITY)
Admission: RE | Disposition: A | Discharge status: Home / Self Care | Payer: Self-pay | Source: Ambulatory Visit | Attending: Internal Medicine

## 2015-08-06 ENCOUNTER — Other Ambulatory Visit: Payer: Self-pay

## 2015-08-06 ENCOUNTER — Encounter (HOSPITAL_COMMUNITY): Payer: Self-pay | Admitting: Internal Medicine

## 2015-08-06 ENCOUNTER — Ambulatory Visit (HOSPITAL_COMMUNITY)
Admission: RE | Admit: 2015-08-06 | Discharge: 2015-08-06 | Disposition: A | Discharge status: Home / Self Care | Payer: BLUE CROSS/BLUE SHIELD | Source: Ambulatory Visit | Attending: Internal Medicine | Admitting: Internal Medicine

## 2015-08-06 DIAGNOSIS — I428 Other cardiomyopathies: Secondary | ICD-10-CM | POA: Diagnosis present

## 2015-08-06 DIAGNOSIS — T829XXA Unspecified complication of cardiac and vascular prosthetic device, implant and graft, initial encounter: Secondary | ICD-10-CM | POA: Insufficient documentation

## 2015-08-06 DIAGNOSIS — Z853 Personal history of malignant neoplasm of breast: Secondary | ICD-10-CM | POA: Insufficient documentation

## 2015-08-06 DIAGNOSIS — I509 Heart failure, unspecified: Secondary | ICD-10-CM | POA: Diagnosis not present

## 2015-08-06 DIAGNOSIS — Z4502 Encounter for adjustment and management of automatic implantable cardiac defibrillator: Secondary | ICD-10-CM | POA: Insufficient documentation

## 2015-08-06 DIAGNOSIS — Z8673 Personal history of transient ischemic attack (TIA), and cerebral infarction without residual deficits: Secondary | ICD-10-CM | POA: Diagnosis not present

## 2015-08-06 DIAGNOSIS — Z9010 Acquired absence of unspecified breast and nipple: Secondary | ICD-10-CM | POA: Diagnosis not present

## 2015-08-06 DIAGNOSIS — Z79899 Other long term (current) drug therapy: Secondary | ICD-10-CM | POA: Diagnosis not present

## 2015-08-06 DIAGNOSIS — Y831 Surgical operation with implant of artificial internal device as the cause of abnormal reaction of the patient, or of later complication, without mention of misadventure at the time of the procedure: Secondary | ICD-10-CM | POA: Insufficient documentation

## 2015-08-06 DIAGNOSIS — I420 Dilated cardiomyopathy: Secondary | ICD-10-CM | POA: Diagnosis present

## 2015-08-06 DIAGNOSIS — Z95810 Presence of automatic (implantable) cardiac defibrillator: Secondary | ICD-10-CM | POA: Diagnosis present

## 2015-08-06 HISTORY — PX: EP IMPLANTABLE DEVICE: SHX172B

## 2015-08-06 LAB — SURGICAL PCR SCREEN
MRSA, PCR: NEGATIVE
Staphylococcus aureus: NEGATIVE

## 2015-08-06 LAB — PROTIME-INR
INR: 2.78 — AB (ref 0.00–1.49)
Prothrombin Time: 28.9 seconds — ABNORMAL HIGH (ref 11.6–15.2)

## 2015-08-06 SURGERY — ICD/BIV ICD GENERATOR CHANGEOUT

## 2015-08-06 MED ORDER — FENTANYL CITRATE (PF) 100 MCG/2ML IJ SOLN
INTRAMUSCULAR | Status: AC
  Filled 2015-08-06: qty 2

## 2015-08-06 MED ORDER — MIDAZOLAM HCL 5 MG/5ML IJ SOLN
INTRAMUSCULAR | Status: AC
  Filled 2015-08-06: qty 5

## 2015-08-06 MED ORDER — CHLORHEXIDINE GLUCONATE 4 % EX LIQD: 60.0000 mL | Freq: Once | CUTANEOUS | Status: DC

## 2015-08-06 MED ORDER — SODIUM CHLORIDE 0.9 % IV SOLN
INTRAVENOUS | Status: DC
  Administered 2015-08-06: 07:00:00 via INTRAVENOUS

## 2015-08-06 MED ORDER — ACETAMINOPHEN 325 MG PO TABS: 325.0000 mg | ORAL_TABLET | ORAL | Status: DC | PRN

## 2015-08-06 MED ORDER — CEFAZOLIN SODIUM-DEXTROSE 2-4 GM/100ML-% IV SOLN
INTRAVENOUS | Status: AC
  Filled 2015-08-06: qty 100

## 2015-08-06 MED ORDER — SODIUM CHLORIDE 0.9 % IV SOLN
INTRAVENOUS | Status: DC
Start: 2015-08-06 — End: 2015-08-06

## 2015-08-06 MED ORDER — MIDAZOLAM HCL 5 MG/5ML IJ SOLN
INTRAMUSCULAR | Status: DC | PRN
  Administered 2015-08-06 (×4): 1 mg via INTRAVENOUS

## 2015-08-06 MED ORDER — LIDOCAINE HCL (PF) 1 % IJ SOLN
INTRAMUSCULAR | Status: AC
  Filled 2015-08-06: qty 30

## 2015-08-06 MED ORDER — GENTAMICIN SULFATE 40 MG/ML IJ SOLN
INTRAMUSCULAR | Status: AC
  Filled 2015-08-06: qty 2

## 2015-08-06 MED ORDER — MUPIROCIN 2 % EX OINT
TOPICAL_OINTMENT | CUTANEOUS | Status: AC
  Administered 2015-08-06: 1 via TOPICAL
  Filled 2015-08-06: qty 22

## 2015-08-06 MED ORDER — LIDOCAINE HCL (PF) 1 % IJ SOLN
INTRAMUSCULAR | Status: AC
Start: 2015-08-06 — End: 2015-08-06
  Filled 2015-08-06: qty 30

## 2015-08-06 MED ORDER — ONDANSETRON HCL 4 MG/2ML IJ SOLN: 4.0000 mg | Freq: Four times a day (QID) | INTRAMUSCULAR | Status: DC | PRN

## 2015-08-06 MED ORDER — LIDOCAINE HCL (PF) 1 % IJ SOLN
INTRAMUSCULAR | Status: DC | PRN
  Administered 2015-08-06: 33 mL

## 2015-08-06 MED ORDER — SODIUM CHLORIDE 0.9 % IR SOLN
80.0000 mg | Status: AC
  Administered 2015-08-06: 80 mg

## 2015-08-06 MED ORDER — FENTANYL CITRATE (PF) 100 MCG/2ML IJ SOLN
INTRAMUSCULAR | Status: DC | PRN
  Administered 2015-08-06: 50 ug via INTRAVENOUS
  Administered 2015-08-06 (×2): 25 ug via INTRAVENOUS

## 2015-08-06 MED ORDER — CEFAZOLIN SODIUM-DEXTROSE 2-4 GM/100ML-% IV SOLN
2.0000 g | INTRAVENOUS | Status: AC
  Administered 2015-08-06: 2 g via INTRAVENOUS

## 2015-08-06 MED ORDER — MUPIROCIN 2 % EX OINT
1.0000 "application " | TOPICAL_OINTMENT | Freq: Once | CUTANEOUS | Status: AC
  Administered 2015-08-06: 1 via TOPICAL
  Filled 2015-08-06: qty 22

## 2015-08-06 SURGICAL SUPPLY — 5 items
CABLE SURGICAL S-101-97-12 (CABLE) ×3 IMPLANT
HEMOSTAT SURGICEL 2X4 FIBR (HEMOSTASIS) ×3 IMPLANT
ICD ELLIPSE DR CD2411-36C (ICD Generator) ×3 IMPLANT
PAD DEFIB LIFELINK (PAD) ×3 IMPLANT
TRAY PACEMAKER INSERTION (PACKS) ×3 IMPLANT

## 2015-08-06 NOTE — Interval H&P Note (Signed)
ICD Criteria  Current LVEF:25%. Within 12 months prior to implant: Yes   Heart failure history: Yes, Class III  Cardiomyopathy history: Yes, Non-Ischemic Cardiomyopathy.  Atrial Fibrillation/Atrial Flutter: No.  Ventricular tachycardia history: No.  Cardiac arrest history: No.  History of syndromes with risk of sudden death: No.  Previous ICD: Yes, Reason for ICD:  Primary prevention.  Current ICD indication: Primary  PPM indication: Yes. Pacing type: Atrial. Greater than 40% RV pacing requirement anticipated. Indication: Sick Sinus Syndrome   Class I or II Bradycardia indication present: Yes  Beta Blocker therapy for 3 or more months: Yes, prescribed.   Ace Inhibitor/ARB therapy for 3 or more months: Yes, prescribed.   History and Physical Interval Note:  08/06/2015 7:58 AM  [name] [name]  has presented today for surgery, with the diagnosis of cm  The various methods of treatment have been discussed with the patient and family. After consideration of risks, benefits and other options for treatment, the patient has consented to  Procedure(s): ICD [employer] (N/A) as a surgical intervention .  The patient's history has been reviewed, patient examined, no change in status, stable for surgery.  I have reviewed the patient's chart and labs.  Questions were answered to the patient's satisfaction.     [name]

## 2015-08-06 NOTE — Discharge Instructions (Signed)
° ° °  Pacemaker Battery Change, Care After Refer to this sheet in the next few weeks. These instructions provide you with information on caring for yourself after your procedure. Your health care provider may also give you more specific instructions. Your treatment has been planned according to current medical practices, but problems sometimes occur. Call your health care provider if you have any problems or questions after your procedure. WHAT TO EXPECT AFTER THE PROCEDURE After your procedure, it is typical to have the following sensations:  Soreness at the pacemaker site. HOME CARE INSTRUCTIONS   Keep the incision clean and dry until tomorrow evening ; dermabond will come off in next 10-14days and if not will remove at wound check visit  No driving for 4 days  Unless advised otherwise, you may shower beginning 48 hours after your procedure.  For the first week after the replacement, avoid stretching motions that pull at the incision site, and avoid heavy exercise with the arm that is on the same side as the incision.  Take medicines only as directed by your health care provider.  Keep all follow-up visits as directed by your health care provider. SEEK MEDICAL CARE IF:   You have pain at the incision site that is not relieved by over-the-counter or prescription medicine.  There is drainage or pus from the incision site.  There is swelling larger than a lime at the incision site.  You develop red streaking that extends above or below the incision site.  You feel brief, intermittent palpitations, light-headedness, or any symptoms that you feel might be related to your heart. SEEK IMMEDIATE MEDICAL CARE IF:   You experience chest pain that is different than the pain at the pacemaker site.  You experience shortness of breath.  You have palpitations or irregular heartbeat.  You have light-headedness that does not go away quickly.  You faint.  You have pain that gets worse and  is not relieved by medicine.   This information is not intended to replace advice given to you by your health care provider. Make sure you discuss any questions you have with your health care provider.   Document Released: 11/17/2012 Document Revised: 02/17/2014 Document Reviewed: 11/17/2012 Elsevier Interactive Patient Education [employer].

## 2015-08-06 NOTE — H&P (Signed)
Patient Care Team: Caryl Bis, MD as PCP - General   HPI  [name] [name] is a 65 y.o. female Seen   for ICD generator replacement.  Initially implanted for primary prevention in the setting of nonischemic cardiomyopathy subsequently found to be related to LMNA mutation  The patient denies chest pain, shortness of breath, nocturnal dyspnea, orthopnea Some peripheral edema occasionallty Prompting extra lasix. There have been no palpitations, lightheadedness or syncope.     Records and Results Reviewed echo  Ef 25-30%    Past Medical History  Diagnosis Date  . Breast cancer ([place])   . Cerebrovascular disease     s/p right MCA cardioemoblic infarct A999333  . Nonischemic cardiomyopathy (HCC)     a. +LMNA mutation b. s/p STJ dual chamber ICD  . CHF (congestive heart failure) ([place])   . Arthritis     thumbs    Past Surgical History  Procedure Laterality Date  . Cardiac defibrillator placement  2010    STJ dual chamber ICD implanted for NICM by Dr Caryl Comes  . Cholecystectomy    . Abdominal hysterectomy    . Mastectomy Right   . Varicose vein surgery Left   . Tonsillectomy    . Colonoscopy    . Esophagogastroduodenoscopy    . Breast reconstruction Right 1995  . Breast implant removal Right 05/31/2015    Procedure: REMOVAL RIGHT  BREAST IMPLANTS;  Surgeon: Crissie Reese, MD;  Location: [facility];  Service: Plastics;  Laterality: Right;  . Breast reconstruction Right 05/31/2015    Procedure: RIGHT BREAST RECONSTRUCTION WITH SALINE IMPLANTS ;  Surgeon: Crissie Reese, MD;  Location: [facility];  Service: Plastics;  Laterality: Right;  . Mastopexy Left 05/31/2015    Procedure: LEFT BREAST MASTOPEXY;  Surgeon: Crissie Reese, MD;  Location: [facility];  Service: Plastics;  Laterality: Left;    Current Facility-Administered Medications  Medication Dose Route Frequency Provider Last Rate Last Dose  . 0.9 %  sodium chloride infusion   Intravenous Continuous Patsey Berthold, NP 50 mL/hr at  08/06/15 0707    . ceFAZolin (ANCEF) IVPB 2g/100 mL premix  2 g Intravenous On Call Amber Sena Slate, NP      . chlorhexidine (HIBICLENS) 4 % liquid 4 application  60 mL Topical Once Amber Sena Slate, NP      . gentamicin (GARAMYCIN) 80 mg in sodium chloride irrigation 0.9 % 500 mL irrigation  80 mg Irrigation On Call Patsey Berthold, NP        No Known Allergies    Medication List    ASK your doctor about these medications        BIOTIN PO  Take 1 tablet by mouth daily.     COQ10 PO  Take 1 tablet by mouth daily.     digoxin 0.125 MG tablet  Commonly known as:  LANOXIN  Take 1 tablet (125 mcg total) by mouth daily.     FISH OIL PO  Take 1 tablet by mouth daily.     furosemide 40 MG tablet  Commonly known as:  LASIX  Take 40 mg by mouth daily as needed for fluid or edema.     metoprolol succinate 50 MG 24 hr tablet  Commonly known as:  TOPROL-XL  Take 50 mg by mouth daily. Take one tablet in the morning and one half tablet in the evening     multivitamin with minerals Tabs tablet  Take 1 tablet by mouth daily.  sacubitril-valsartan 24-26 MG  Commonly known as:  ENTRESTO  Take 1 tablet by mouth 2 (two) times daily.     spironolactone 25 MG tablet  Commonly known as:  ALDACTONE  Take 0.5 tablets (12.5 mg total) by mouth daily.       Social History   Social History  . Marital Status: Married    Spouse Name: N/A  . Number of Children: N/A  . Years of Education: N/A   Occupational History  . Not on file.   Social History Main Topics  . Smoking status: Never Smoker   . Smokeless tobacco: Never Used  . Alcohol Use: No  . Drug Use: No  . Sexual Activity: Not on file   Other Topics Concern  . Not on file   Social History Narrative   Family History  Problem Relation Age of Onset  . Cardiomyopathy      family hx of  . Other Brother     s/p heart transplant  . Stroke      family hx of     Review of Systems negative except from HPI and PMH  Physical  Exam BP 112/65 mmHg  Pulse 60  Temp(Src) 97.9 F (36.6 C) (Oral)  Resp 18  Ht 5\' 8"  (1.727 m)  Wt 161 lb (73.029 kg)  BMI 24.49 kg/m2  SpO2 99% Well developed and well nourished in no acute distress HENT normal E scleral and icterus clear Neck Supple JVP flat; carotids brisk and full Clear to ausculation Device pocket well healed; without hematoma or erythema.  There is no tethering  Regular rate and rhythm, no murmurs gallops or rub Soft with active bowel sounds No clubbing cyanosis  Edema Alert and oriented, grossly normal motor and sensory function Skin Warm and Dry  ECG  A pacing QRS duration 120 msec  Assessment and  Plan  DCM  ICD  Fam hx of + gene mutation  High risk medication surveillance  For generator replacement    We have reviewed the benefits and risks of generator replacement.  These include but are not limited to lead fracture and infection.  The patient understands, agrees and is willing to proceed.

## 2015-08-07 ENCOUNTER — Telehealth: Payer: Self-pay

## 2015-08-07 ENCOUNTER — Encounter (HOSPITAL_COMMUNITY): Payer: Self-pay | Admitting: Internal Medicine

## 2015-08-07 NOTE — Telephone Encounter (Signed)
Returned call to patient as requested in voice mail message.  She stated one of her friends is being followed in [facility] clinic and suggested she call to help her with a device question.  She stated she had a device change yesterday and thought she would be sending a new monitor. She stated she did receive a wireless adaptor and attempted to send a remote transmission which did not work.  She has an office appointment to check the wound on 08/16/2015.  Advised she does not need a new monitor.  She can bring the monitor with her to office appointment and the manual process will be reviewed or she can call [employer] for assistance.  She stated she will bring monitor with her to the office appointment.

## 2015-08-16 ENCOUNTER — Ambulatory Visit (INDEPENDENT_AMBULATORY_CARE_PROVIDER_SITE_OTHER): Payer: BLUE CROSS/BLUE SHIELD | Admitting: *Deleted

## 2015-08-16 DIAGNOSIS — Z95810 Presence of automatic (implantable) cardiac defibrillator: Secondary | ICD-10-CM

## 2015-08-16 LAB — CUP PACEART INCLINIC DEVICE CHECK
Brady Statistic RV Percent Paced: 0.01 %
Date Time Interrogation Session: 20170706105054
HIGH POWER IMPEDANCE MEASURED VALUE: 57.375
Implantable Lead Implant Date: 20100416
Implantable Lead Location: 753859
Lead Channel Impedance Value: 450 Ohm
Lead Channel Pacing Threshold Amplitude: 1 V
Lead Channel Pacing Threshold Pulse Width: 0.5 ms
Lead Channel Sensing Intrinsic Amplitude: 11.7 mV
Lead Channel Setting Pacing Amplitude: 2.5 V
Lead Channel Setting Pacing Pulse Width: 0.5 ms
Lead Channel Setting Sensing Sensitivity: 0.5 mV
MDC IDC LEAD IMPLANT DT: 20100416
MDC IDC LEAD LOCATION: 753860
MDC IDC LEAD MODEL: 7122
MDC IDC MSMT BATTERY REMAINING LONGEVITY: 94.8
MDC IDC MSMT LEADCHNL RA IMPEDANCE VALUE: 425 Ohm
MDC IDC MSMT LEADCHNL RA PACING THRESHOLD AMPLITUDE: 0.5 V
MDC IDC MSMT LEADCHNL RA PACING THRESHOLD AMPLITUDE: 0.5 V
MDC IDC MSMT LEADCHNL RA PACING THRESHOLD PULSEWIDTH: 0.5 ms
MDC IDC MSMT LEADCHNL RA SENSING INTR AMPL: 2.2 mV
MDC IDC MSMT LEADCHNL RV PACING THRESHOLD AMPLITUDE: 1 V
MDC IDC MSMT LEADCHNL RV PACING THRESHOLD PULSEWIDTH: 0.5 ms
MDC IDC MSMT LEADCHNL RV PACING THRESHOLD PULSEWIDTH: 0.5 ms
MDC IDC SET LEADCHNL RA PACING AMPLITUDE: 2 V
MDC IDC STAT BRADY RA PERCENT PACED: 48 %
Pulse Gen Serial Number: 7370130

## 2015-08-16 NOTE — Progress Notes (Signed)
Wound check appointment. Steri-strips removed. Wound without redness or edema. Incision edges approximated, wound well healed. Normal device function. Thresholds, sensing, and impedances consistent with implant measurements.Delman C[place]le distribution appropriate for patient and level of activity. No mode switches or ventricular arrhythmias noted. Patient educated about wound care, shock plan. Vibratory alert demonstrated for pt. ROV 11/02/2015 with SK

## 2015-08-22 ENCOUNTER — Telehealth: Payer: Self-pay | Admitting: Internal Medicine

## 2015-08-22 NOTE — Telephone Encounter (Signed)
New message      1. Has your device fired? no  2. Is you device beeping? no  3. Are you experiencing draining or swelling at device site? no  4. Are you calling to see if we received your device transmission? The green light comes and when the pt goes to down load it does nothing and will not down load the pt was seen last week and was having trouble then the pt was instructed to bring the device back if not working, she would like to speak with someone  5. Have you passed out? no

## 2015-08-22 NOTE — Telephone Encounter (Signed)
Pt is going to come to the office to get help w/ her home monitor.

## 2015-08-30 ENCOUNTER — Ambulatory Visit (INDEPENDENT_AMBULATORY_CARE_PROVIDER_SITE_OTHER): Payer: BLUE CROSS/BLUE SHIELD | Admitting: *Deleted

## 2015-08-30 DIAGNOSIS — I6350 Cerebral infarction due to unspecified occlusion or stenosis of unspecified cerebral artery: Secondary | ICD-10-CM | POA: Diagnosis not present

## 2015-08-30 DIAGNOSIS — Z7901 Long term (current) use of anticoagulants: Secondary | ICD-10-CM

## 2015-08-30 LAB — POCT INR: INR: 2.2

## 2015-09-27 ENCOUNTER — Ambulatory Visit (INDEPENDENT_AMBULATORY_CARE_PROVIDER_SITE_OTHER): Payer: BLUE CROSS/BLUE SHIELD | Admitting: *Deleted

## 2015-09-27 DIAGNOSIS — I6350 Cerebral infarction due to unspecified occlusion or stenosis of unspecified cerebral artery: Secondary | ICD-10-CM

## 2015-09-27 DIAGNOSIS — Z7901 Long term (current) use of anticoagulants: Secondary | ICD-10-CM

## 2015-09-27 LAB — POCT INR: INR: 2.1

## 2015-10-04 ENCOUNTER — Encounter: Payer: Self-pay | Admitting: Cardiology

## 2015-10-15 NOTE — Progress Notes (Signed)
.  HPI The patient has a history of a nonischemic cardiomyopathy. Her ejection fraction was at one point in time her EF was 15 - 20%. The most recent ejection fraction was about 40% in 2015.   Most recently the EF was 25% again.   Since I last saw her she had an ICD generator change.  At the last visit with me I was unable to titrate Entresto secondary to her hypotension.  However, today her BP is increased.  She has had some increased ankle edema.  She denies any increased SOB and she is walking an hour daily.  Her weight is down.  She did fall trying to paddle board and hit her ribs.   No Known Allergies  Current Outpatient Prescriptions  Medication Sig Dispense Refill  . BIOTIN PO Take 1 tablet by mouth daily.    . Coenzyme Q10 (COQ10 PO) Take 1 tablet by mouth daily.    . digoxin (LANOXIN) 0.125 MG tablet Take 1 tablet (125 mcg total) by mouth daily. 90 tablet 3  . furosemide (LASIX) 40 MG tablet Take 1 tablet (40 mg total) by mouth daily as needed for fluid or edema. 90 tablet 1  . metoprolol succinate (TOPROL-XL) 50 MG 24 hr tablet Take one tablet in the morning and one half tablet in the evening 135 tablet 3  . Multiple Vitamin (MULTIVITAMIN WITH MINERALS) TABS tablet Take 1 tablet by mouth daily.    . Omega-3 Fatty Acids (FISH OIL PO) Take 1 tablet by mouth daily.    Marland Kitchen spironolactone (ALDACTONE) 25 MG tablet Take 0.5 tablets (12.5 mg total) by mouth daily. 90 tablet 3  . sacubitril-valsartan (ENTRESTO) 49-51 MG Take 1 tablet by mouth 2 (two) times daily. 180 tablet 3   No current facility-administered medications for this visit.     Past Medical History:  Diagnosis Date  . Arthritis    thumbs  . Breast cancer ([place])   . Cerebrovascular disease    s/p right MCA cardioemoblic infarct [id number]  . CHF (congestive heart failure) ([place])   . Nonischemic cardiomyopathy (HCC)    a. +LMNA mutation b. s/p STJ dual chamber ICD    Past Surgical History:  Procedure Laterality Date    . ABDOMINAL HYSTERECTOMY    . BREAST IMPLANT REMOVAL Right 05/31/2015   Procedure: REMOVAL RIGHT  BREAST IMPLANTS;  Surgeon: Crissie Reese, MD;  Location: [facility];  Service: Plastics;  Laterality: Right;  . BREAST RECONSTRUCTION Right 1995  . BREAST RECONSTRUCTION Right 05/31/2015   Procedure: RIGHT BREAST RECONSTRUCTION WITH SALINE IMPLANTS ;  Surgeon: Crissie Reese, MD;  Location: [facility];  Service: Plastics;  Laterality: Right;  . CARDIAC DEFIBRILLATOR PLACEMENT  2010   STJ dual chamber ICD implanted for NICM by Dr Caryl Comes  . CHOLECYSTECTOMY    . COLONOSCOPY    . EP IMPLANTABLE DEVICE N/A 08/06/2015   Procedure: ICD Generator Changeout;  Surgeon: Deboraha Sprang, MD;  Location: [place] [facility];  Service: Cardiovascular;  Laterality: N/A;  . ESOPHAGOGASTRODUODENOSCOPY    . MASTECTOMY Right   . MASTOPEXY Left 05/31/2015   Procedure: LEFT BREAST MASTOPEXY;  Surgeon: Crissie Reese, MD;  Location: [facility];  Service: Plastics;  Laterality: Left;  . TONSILLECTOMY    . VARICOSE VEIN SURGERY Left     ROS:  As stated in the HPI and negative for all other systems.  PHYSICAL EXAM BP 112/72   Pulse 65   Ht 5\' 8"  (1.727 m)   Wt 163  lb 3.2 oz (74 kg)   BMI 24.81 kg/m  GENERAL:  Well appearing NECK:  No jugular venous distention, waveform within normal limits, carotid upstroke brisk and symmetric, no bruits, no thyromegaly LUNGS:  Clear to auscultation bilaterally BACK:  No CVA tenderness CHEST:  Well healed ICD pocket HEART:  PMI not displaced or sustained,S1 and S2 within normal limits, no S3, no S4, no clicks, no rubs, no murmurs ABD:  Flat, positive bowel sounds normal in frequency in pitch, no bruits, no rebound, no guarding, no midline pulsatile mass, no hepatomegaly, no splenomegaly EXT:  2 plus pulses throughout, no edema, no cyanosis no clubbing   ASSESSMENT AND PLAN  CARDIOMYOPATHY, PRIMARY, DILATED - She tolerated the current dose of Entresto.  I will try to increase to the 49/51 dose.    I will check a BMET in two weeks.    Implantable defibrillator -  She follows in our defibrillator clinic.   She will have a generator change later this month.    CVA -  History of CVA felt to be cardioembolic in nature. Continue Coumadin.

## 2015-10-18 ENCOUNTER — Ambulatory Visit (INDEPENDENT_AMBULATORY_CARE_PROVIDER_SITE_OTHER): Payer: BLUE CROSS/BLUE SHIELD | Admitting: Cardiology

## 2015-10-18 ENCOUNTER — Encounter: Payer: Self-pay | Admitting: Cardiology

## 2015-10-18 VITALS — BP 112/72 | HR 65 | Ht 68.0 in | Wt 163.2 lb

## 2015-10-18 DIAGNOSIS — I420 Dilated cardiomyopathy: Secondary | ICD-10-CM | POA: Diagnosis not present

## 2015-10-18 DIAGNOSIS — Z79899 Other long term (current) drug therapy: Secondary | ICD-10-CM

## 2015-10-18 MED ORDER — SACUBITRIL-VALSARTAN 49-51 MG PO TABS: 1.0000 | ORAL_TABLET | Freq: Two times a day (BID) | ORAL | 3 refills | Status: DC

## 2015-10-18 MED ORDER — SPIRONOLACTONE 25 MG PO TABS: 12.5000 mg | ORAL_TABLET | Freq: Every day | ORAL | 3 refills | Status: DC

## 2015-10-18 MED ORDER — METOPROLOL SUCCINATE ER 50 MG PO TB24: ORAL_TABLET | ORAL | 3 refills | Status: DC

## 2015-10-18 MED ORDER — DIGOXIN 125 MCG PO TABS: 125.0000 ug | ORAL_TABLET | Freq: Every day | ORAL | 3 refills | Status: DC

## 2015-10-18 MED ORDER — FUROSEMIDE 40 MG PO TABS: 40.0000 mg | ORAL_TABLET | Freq: Every day | ORAL | 1 refills | Status: DC | PRN

## 2015-10-18 NOTE — Patient Instructions (Signed)
Medication Instructions:  Continue current medications  Labwork: BMP 2 week  Testing/Procedures: None Ordered  Follow-Up: Your physician recommends that you schedule a follow-up appointment in: 1 Month   Any Other Special Instructions Will Be Listed Below (If Applicable).   If you need a refill on your cardiac medications before your next appointment, please call your pharmacy.

## 2015-10-19 ENCOUNTER — Encounter: Payer: Self-pay | Admitting: Internal Medicine

## 2015-11-02 ENCOUNTER — Ambulatory Visit (INDEPENDENT_AMBULATORY_CARE_PROVIDER_SITE_OTHER): Payer: BLUE CROSS/BLUE SHIELD | Admitting: Internal Medicine

## 2015-11-02 ENCOUNTER — Encounter: Payer: Self-pay | Admitting: Internal Medicine

## 2015-11-02 VITALS — BP 90/64 | HR 84 | Ht 68.0 in | Wt 164.4 lb

## 2015-11-02 DIAGNOSIS — I5022 Chronic systolic (congestive) heart failure: Secondary | ICD-10-CM | POA: Diagnosis not present

## 2015-11-02 DIAGNOSIS — I420 Dilated cardiomyopathy: Secondary | ICD-10-CM

## 2015-11-02 DIAGNOSIS — Z95810 Presence of automatic (implantable) cardiac defibrillator: Secondary | ICD-10-CM | POA: Diagnosis not present

## 2015-11-02 LAB — CUP PACEART INCLINIC DEVICE CHECK
Date Time Interrogation Session: 20170922170715
Implantable Lead Location: 753859
Implantable Lead Model: 7122
Lead Channel Pacing Threshold Amplitude: 0.5 V
Lead Channel Pacing Threshold Amplitude: 1 V
Lead Channel Pacing Threshold Pulse Width: 0.5 ms
Lead Channel Setting Pacing Amplitude: 2 V
Lead Channel Setting Sensing Sensitivity: 0.5 mV
MDC IDC LEAD IMPLANT DT: 20100416
MDC IDC LEAD IMPLANT DT: 20100416
MDC IDC LEAD LOCATION: 753860
MDC IDC MSMT BATTERY REMAINING LONGEVITY: 86 mo
MDC IDC MSMT LEADCHNL LV IMPEDANCE VALUE: 540 Ohm
MDC IDC MSMT LEADCHNL RA IMPEDANCE VALUE: 390 Ohm
MDC IDC MSMT LEADCHNL RA PACING THRESHOLD PULSEWIDTH: 0.5 ms
MDC IDC MSMT LEADCHNL RA SENSING INTR AMPL: 1.5 mV
MDC IDC MSMT LEADCHNL RV SENSING INTR AMPL: 12 mV — AB
MDC IDC SET LEADCHNL RV PACING AMPLITUDE: 2.5 V
MDC IDC SET LEADCHNL RV PACING PULSEWIDTH: 0.5 ms
MDC IDC STAT BRADY RA PERCENT PACED: 53 %
MDC IDC STAT BRADY RV PERCENT PACED: 1.8 %
Pulse Gen Serial Number: 7370130

## 2015-11-02 LAB — BASIC METABOLIC PANEL
BUN: 18 mg/dL (ref 7–25)
CO2: 27 mmol/L (ref 20–31)
Calcium: 9.2 mg/dL (ref 8.6–10.4)
Chloride: 103 mmol/L (ref 98–110)
Creat: 0.84 mg/dL (ref 0.50–0.99)
GLUCOSE: 102 mg/dL — AB (ref 65–99)
POTASSIUM: 4 mmol/L (ref 3.5–5.3)
SODIUM: 139 mmol/L (ref 135–146)

## 2015-11-02 LAB — DIGOXIN LEVEL: Digoxin Level: 0.7 ug/L — ABNORMAL LOW (ref 0.8–2.0)

## 2015-11-02 NOTE — Progress Notes (Signed)
Patient Care Team: Caryl Bis, MD as PCP - General   HPI  [name] [name] is a 65 y.o. female Seen in followup for ICD implanted for primary prevention in the setting of nonischemic heart disease.  June 2017 underwent device generator replacement.   The patient denies chest pain, shortness of breath, nocturnal dyspnea, orthopnea  Some peripheral edema occasionallty  Prompting extra lasix. There have been no palpitations, lightheadedness or syncope.   + gene test for LMNA mutation    6/17 K  4.2 Cr  0.77 Past Medical History:  Diagnosis Date  . Arthritis    thumbs  . Breast cancer ([place])   . Cerebrovascular disease    s/p right MCA cardioemoblic infarct [id number]  . CHF (congestive heart failure) ([place])   . Nonischemic cardiomyopathy (HCC)    a. +LMNA mutation b. s/p STJ dual chamber ICD    Past Surgical History:  Procedure Laterality Date  . ABDOMINAL HYSTERECTOMY    . BREAST IMPLANT REMOVAL Right 05/31/2015   Procedure: REMOVAL RIGHT  BREAST IMPLANTS;  Surgeon: Crissie Reese, MD;  Location: [facility] [place];  Service: Plastics;  Laterality: Right;  . BREAST RECONSTRUCTION Right 1995  . BREAST RECONSTRUCTION Right 05/31/2015   Procedure: RIGHT BREAST RECONSTRUCTION WITH SALINE IMPLANTS ;  Surgeon: Crissie Reese, MD;  Location: [facility];  Service: Plastics;  Laterality: Right;  . CARDIAC DEFIBRILLATOR PLACEMENT  2010   STJ dual chamber ICD implanted for NICM by Dr Caryl Comes  . CHOLECYSTECTOMY    . COLONOSCOPY    . EP IMPLANTABLE DEVICE N/A 08/06/2015   Procedure: ICD Generator Changeout;  Surgeon: Deboraha Sprang, MD;  Location: [facility] [facility];  Service: Cardiovascular;  Laterality: N/A;  . ESOPHAGOGASTRODUODENOSCOPY    . MASTECTOMY Right   . MASTOPEXY Left 05/31/2015   Procedure: LEFT BREAST MASTOPEXY;  Surgeon: Crissie Reese, MD;  Location: [facility];  Service: Plastics;  Laterality: Left;  . TONSILLECTOMY    . VARICOSE VEIN SURGERY Left     Current Outpatient Prescriptions    Medication Sig Dispense Refill  . BIOTIN PO Take 1 tablet by mouth daily.    . Coenzyme Q10 (COQ10 PO) Take 1 tablet by mouth daily.    . digoxin (LANOXIN) 0.125 MG tablet Take 1 tablet (125 mcg total) by mouth daily. 90 tablet 3  . furosemide (LASIX) 40 MG tablet Take 1 tablet (40 mg total) by mouth daily as needed for fluid or edema. 90 tablet 1  . metoprolol succinate (TOPROL-XL) 50 MG 24 hr tablet Take 1 tablet by mouth in the AM and 0.5 tablet by mouth in the PM    . Multiple Vitamin (MULTIVITAMIN WITH MINERALS) TABS tablet Take 1 tablet by mouth daily.    . Omega-3 Fatty Acids (FISH OIL PO) Take 1 tablet by mouth daily.    . sacubitril-valsartan (ENTRESTO) 49-51 MG Take 1 tablet by mouth 2 (two) times daily. 180 tablet 3  . spironolactone (ALDACTONE) 25 MG tablet Take 0.5 tablets (12.5 mg total) by mouth daily. 90 tablet 3   No current facility-administered medications for this visit.     No Known Allergies  Review of Systems negative except from HPI and PMH  Physical Exam BP 90/64   Pulse 84   Ht 5\' 8"  (1.727 m)   Wt 164 lb 6.4 oz (74.6 kg)   SpO2 94%   BMI 25.00 kg/m  Well developed and well nourished in no acute distress HENT normal E  scleral and icterus clear Neck Supple JVP flat; carotids brisk and full Clear to ausculation Device pocket well healed; without hematoma or erythema.  There is no tethering Regular rate and rhythm, no murmurs gallops or rub Soft with active bowel sounds No clubbing cyanosis none Edema Alert and oriented, grossly normal motor and sensory function Skin Warm and Dry  ECG Apacing ..24/13/41  Assessment and  Plan  DCM  ICD  Fam hx of + gene mutation  High risk medication surveillance  Euvolemic continue current meds  She is doing well. We will check her potassium and digoxin level

## 2015-11-02 NOTE — Patient Instructions (Addendum)
Medication Instructions: - Your physician recommends that you continue on your current medications as directed. Please refer to the Current Medication list given to you today.  Labwork: - Your physician recommends that you have lab work today:BMP/ Digoxin  Procedures/Testing: - none ordered  Follow-Up: - Remote monitoring is used to monitor your Pacemaker of ICD from home. This monitoring reduces the number of office visits required to check your device to one time per year. It allows [place] to keep an eye on the functioning of your device to ensure it is working properly. You are scheduled for a device check from home on 02/05/16. You may send your transmission at any time that day. If you have a wireless device, the transmission will be sent automatically. After your physician reviews your transmission, you will receive a postcard with your next transmission date.  - Your physician wants you to follow-up in: 9 months with Chanetta Marshall, NP for Dr. Caryl Comes. You will receive a reminder letter in the mail two months in advance. If you don't receive a letter, please call our office to schedule the follow-up appointment.  Any Additional Special Instructions Will Be Listed Below (If Applicable).     If you need a refill on your cardiac medications before your next appointment, please call your pharmacy.

## 2015-11-08 ENCOUNTER — Telehealth: Payer: Self-pay | Admitting: Cardiology

## 2015-11-08 NOTE — Telephone Encounter (Signed)
New message    Pt verbalized that she is retuning call for rn

## 2015-11-09 NOTE — Telephone Encounter (Signed)
The patient is aware of her lab results.

## 2015-11-13 ENCOUNTER — Ambulatory Visit (INDEPENDENT_AMBULATORY_CARE_PROVIDER_SITE_OTHER): Payer: BLUE CROSS/BLUE SHIELD | Admitting: *Deleted

## 2015-11-13 DIAGNOSIS — Z7901 Long term (current) use of anticoagulants: Secondary | ICD-10-CM | POA: Diagnosis not present

## 2015-11-13 DIAGNOSIS — I6350 Cerebral infarction due to unspecified occlusion or stenosis of unspecified cerebral artery: Secondary | ICD-10-CM

## 2015-11-13 LAB — POCT INR: INR: 2.3

## 2015-11-19 ENCOUNTER — Encounter: Payer: Self-pay | Admitting: Cardiology

## 2015-12-02 NOTE — Progress Notes (Signed)
.  HPI The patient has a history of a nonischemic cardiomyopathy. Her ejection fraction was at one point in time 15 - 20%. A later ejection fraction was about 40% in 2015.   Most recently the EF was 25% again.   Since I last saw her she had an ICD generator change.  At the last visit with me I titrated Entresto. She felt okay with this although she's had a slight bit of lightheadedness. She's walking about 4 miles most days of the week. The patient denies any new symptoms such as chest discomfort, neck or arm discomfort. There has been no new shortness of breath, PND or orthopnea. There have been no reported palpitations, presyncope or syncope.  No Known Allergies  Current Outpatient Prescriptions  Medication Sig Dispense Refill  . BIOTIN PO Take 1 tablet by mouth daily.    . Coenzyme Q10 (COQ10 PO) Take 1 tablet by mouth daily.    . digoxin (LANOXIN) 0.125 MG tablet Take 1 tablet (125 mcg total) by mouth daily. 90 tablet 3  . furosemide (LASIX) 40 MG tablet Take 1 tablet (40 mg total) by mouth daily as needed for fluid or edema. 90 tablet 1  . metoprolol succinate (TOPROL-XL) 50 MG 24 hr tablet Take 1 tablet by mouth in the AM and 0.5 tablet by mouth in the PM    . Multiple Vitamin (MULTIVITAMIN WITH MINERALS) TABS tablet Take 1 tablet by mouth daily.    . Omega-3 Fatty Acids (FISH OIL PO) Take 1 tablet by mouth daily.    . sacubitril-valsartan (ENTRESTO) 49-51 MG Take 1 tablet by mouth 2 (two) times daily. 180 tablet 3  . spironolactone (ALDACTONE) 25 MG tablet Take 0.5 tablets (12.5 mg total) by mouth daily. 90 tablet 3  . sacubitril-valsartan (ENTRESTO) 24-26 MG Take 1 tablet by mouth 2 (two) times daily. 60 tablet 9   No current facility-administered medications for this visit.     Past Medical History:  Diagnosis Date  . Arthritis    thumbs  . Breast cancer ([place])   . Cerebrovascular disease    s/p right MCA cardioemoblic infarct A999333  . CHF (congestive heart failure)  ([place])   . Nonischemic cardiomyopathy (HCC)    a. +LMNA mutation b. s/p STJ dual chamber ICD    Past Surgical History:  Procedure Laterality Date  . ABDOMINAL HYSTERECTOMY    . BREAST IMPLANT REMOVAL Right 05/31/2015   Procedure: REMOVAL RIGHT  BREAST IMPLANTS;  Surgeon: Crissie Reese, MD;  Location: [place];  Service: Plastics;  Laterality: Right;  . BREAST RECONSTRUCTION Right 1995  . BREAST RECONSTRUCTION Right 05/31/2015   Procedure: RIGHT BREAST RECONSTRUCTION WITH SALINE IMPLANTS ;  Surgeon: Crissie Reese, MD;  Location: [place];  Service: Plastics;  Laterality: Right;  . CARDIAC DEFIBRILLATOR PLACEMENT  2010   STJ dual chamber ICD implanted for NICM by Dr Caryl Comes  . CHOLECYSTECTOMY    . COLONOSCOPY    . EP IMPLANTABLE DEVICE N/A 08/06/2015   Procedure: ICD Generator Changeout;  Surgeon: Deboraha Sprang, MD;  Location: [facility] [facility];  Service: Cardiovascular;  Laterality: N/A;  . ESOPHAGOGASTRODUODENOSCOPY    . MASTECTOMY Right   . MASTOPEXY Left 05/31/2015   Procedure: LEFT BREAST MASTOPEXY;  Surgeon: Crissie Reese, MD;  Location: [facility];  Service: Plastics;  Laterality: Left;  . TONSILLECTOMY    . VARICOSE VEIN SURGERY Left     ROS:   As stated in the HPI and negative for all other systems.  PHYSICAL EXAM BP 104/64 (BP Location: Left Arm, Patient Position: Sitting, Cuff Size: Normal)   Pulse 60   Ht 5\' 8"  (1.727 m)   Wt 165 lb 3.2 oz (74.9 kg)   BMI 25.12 kg/m  GENERAL:  Well appearing NECK:  No jugular venous distention, waveform within normal limits, carotid upstroke brisk and symmetric, no bruits, no thyromegaly LUNGS:  Clear to auscultation bilaterally BACK:  No CVA tenderness CHEST:  Well healed ICD pocket HEART:  PMI not displaced or sustained,S1 and S2 within normal limits, no S3, no S4, no clicks, no rubs, no murmurs ABD:  Flat, positive bowel sounds normal in frequency in pitch, no bruits, no rebound, no guarding, no midline pulsatile mass, no hepatomegaly, no  splenomegaly EXT:  2 plus pulses throughout, no edema, no cyanosis no clubbing    ASSESSMENT AND PLAN  CARDIOMYOPATHY, PRIMARY, DILATED - She tolerated the current dose of Entresto.  I will try to increase this by adding another 24/26 mg tablet..   I will check a BMET in two weeks.    Implantable defibrillator -  She follows in our defibrillator clinic.   She will have a generator change later this month.    CVA -  History of CVA felt to be cardioembolic in nature. Continue Coumadin.

## 2015-12-03 ENCOUNTER — Encounter: Payer: Self-pay | Admitting: Cardiology

## 2015-12-03 ENCOUNTER — Ambulatory Visit (INDEPENDENT_AMBULATORY_CARE_PROVIDER_SITE_OTHER): Payer: BLUE CROSS/BLUE SHIELD | Admitting: Cardiology

## 2015-12-03 VITALS — BP 104/64 | HR 60 | Ht 68.0 in | Wt 165.2 lb

## 2015-12-03 DIAGNOSIS — Z79899 Other long term (current) drug therapy: Secondary | ICD-10-CM | POA: Diagnosis not present

## 2015-12-03 DIAGNOSIS — I420 Dilated cardiomyopathy: Secondary | ICD-10-CM | POA: Diagnosis not present

## 2015-12-03 MED ORDER — SACUBITRIL-VALSARTAN 24-26 MG PO TABS: 1.0000 | ORAL_TABLET | Freq: Two times a day (BID) | ORAL | 9 refills | Status: DC

## 2015-12-03 NOTE — Patient Instructions (Addendum)
Medication Instructions:  START Entresto 24/26 mg   Labwork: BMP   Testing/Procedures: None Ordered  Follow-Up: Your physician wants you to follow-up in: 6 Months. You will receive a reminder letter in the mail two months in advance. If you don't receive a letter, please call our office to schedule the follow-up appointment.   Any Other Special Instructions Will Be Listed Below (If Applicable).   If you need a refill on your cardiac medications before your next appointment, please call your pharmacy.

## 2015-12-04 ENCOUNTER — Encounter: Payer: Self-pay | Admitting: Cardiology

## 2015-12-25 ENCOUNTER — Ambulatory Visit (INDEPENDENT_AMBULATORY_CARE_PROVIDER_SITE_OTHER): Payer: BLUE CROSS/BLUE SHIELD | Admitting: *Deleted

## 2015-12-25 DIAGNOSIS — Z7901 Long term (current) use of anticoagulants: Secondary | ICD-10-CM

## 2015-12-25 DIAGNOSIS — I6350 Cerebral infarction due to unspecified occlusion or stenosis of unspecified cerebral artery: Secondary | ICD-10-CM

## 2015-12-25 LAB — POCT INR: INR: 3.8

## 2015-12-26 ENCOUNTER — Other Ambulatory Visit: Payer: Self-pay | Admitting: Cardiology

## 2015-12-26 MED ORDER — WARFARIN SODIUM 5 MG PO TABS: ORAL_TABLET | ORAL | 0 refills | Status: DC

## 2015-12-26 NOTE — Telephone Encounter (Signed)
Per most recent anticoag check yesterday -  Hold coumadin tonight then resume 1 tablet daily except 1/2 tablet on Mondays, Wednesdays and Fridays    Refill completed.

## 2015-12-26 NOTE — Telephone Encounter (Signed)
REFILL:  Patient called stating that she needs refill on coumdin. [facility].

## 2016-01-14 ENCOUNTER — Other Ambulatory Visit: Payer: Self-pay | Admitting: Cardiology

## 2016-01-15 ENCOUNTER — Ambulatory Visit (INDEPENDENT_AMBULATORY_CARE_PROVIDER_SITE_OTHER): Payer: BLUE CROSS/BLUE SHIELD | Admitting: *Deleted

## 2016-01-15 DIAGNOSIS — Z7901 Long term (current) use of anticoagulants: Secondary | ICD-10-CM | POA: Diagnosis not present

## 2016-01-15 DIAGNOSIS — I6350 Cerebral infarction due to unspecified occlusion or stenosis of unspecified cerebral artery: Secondary | ICD-10-CM | POA: Diagnosis not present

## 2016-01-15 LAB — POCT INR: INR: 2

## 2016-01-22 ENCOUNTER — Telehealth: Payer: Self-pay | Admitting: Cardiology

## 2016-01-22 NOTE — Telephone Encounter (Signed)
New Message  Pt c/o medication issue:  1. Name of Medication:  entresto 24-26 mg take twice daily entresto 49-51 mg take twice daily  2. How are you currently taking this medication (dosage and times per day)? See above  3. Are you having a reaction (difficulty breathing--STAT)? No  4. What is your medication issue? Pt wouldn't go into detail besides she is wanting nurse to f/u with her regarding this medication.

## 2016-01-22 NOTE — Telephone Encounter (Signed)
Spoke with letting her know samples of Entresto at front desk

## 2016-01-22 NOTE — Telephone Encounter (Signed)
Spoke to patient . Patient would like to speak to [place]. Forward called.

## 2016-02-01 ENCOUNTER — Other Ambulatory Visit: Payer: Self-pay | Admitting: Cardiology

## 2016-02-05 ENCOUNTER — Encounter: Payer: BLUE CROSS/BLUE SHIELD | Admitting: *Deleted

## 2016-02-05 ENCOUNTER — Telehealth: Payer: Self-pay | Admitting: Cardiology

## 2016-02-05 NOTE — Telephone Encounter (Signed)
LMOVM reminding pt to send remote transmission.   

## 2016-02-08 ENCOUNTER — Encounter: Payer: Self-pay | Admitting: Cardiology

## 2016-02-19 ENCOUNTER — Ambulatory Visit (INDEPENDENT_AMBULATORY_CARE_PROVIDER_SITE_OTHER): Payer: BLUE CROSS/BLUE SHIELD | Admitting: *Deleted

## 2016-02-19 DIAGNOSIS — I6350 Cerebral infarction due to unspecified occlusion or stenosis of unspecified cerebral artery: Secondary | ICD-10-CM

## 2016-02-19 DIAGNOSIS — Z7901 Long term (current) use of anticoagulants: Secondary | ICD-10-CM | POA: Diagnosis not present

## 2016-02-19 LAB — POCT INR: INR: 3

## 2016-02-27 ENCOUNTER — Ambulatory Visit (INDEPENDENT_AMBULATORY_CARE_PROVIDER_SITE_OTHER): Payer: BLUE CROSS/BLUE SHIELD | Admitting: *Deleted

## 2016-02-27 DIAGNOSIS — I428 Other cardiomyopathies: Secondary | ICD-10-CM | POA: Diagnosis not present

## 2016-02-27 DIAGNOSIS — I429 Cardiomyopathy, unspecified: Secondary | ICD-10-CM

## 2016-02-29 NOTE — Progress Notes (Signed)
Remote ICD transmission.   

## 2016-03-05 ENCOUNTER — Encounter: Payer: Self-pay | Admitting: Cardiology

## 2016-03-06 LAB — CUP PACEART REMOTE DEVICE CHECK
Battery Remaining Longevity: 89 mo
Battery Voltage: 3.19 V
Brady Statistic RA Percent Paced: 13 %
Date Time Interrogation Session: 20180117212529
HighPow Impedance: 61 Ohm
HighPow Impedance: 61 Ohm
Implantable Lead Implant Date: 20100416
Implantable Lead Location: 753859
Lead Channel Impedance Value: 380 Ohm
Lead Channel Impedance Value: 430 Ohm
Lead Channel Pacing Threshold Amplitude: 1 V
Lead Channel Sensing Intrinsic Amplitude: 11.7 mV
Lead Channel Setting Pacing Amplitude: 2 V
Lead Channel Setting Pacing Pulse Width: 0.5 ms
Lead Channel Setting Sensing Sensitivity: 0.5 mV
MDC IDC LEAD IMPLANT DT: 20100416
MDC IDC LEAD LOCATION: 753860
MDC IDC MSMT BATTERY REMAINING PERCENTAGE: 93 %
MDC IDC MSMT LEADCHNL RA PACING THRESHOLD AMPLITUDE: 0.5 V
MDC IDC MSMT LEADCHNL RA PACING THRESHOLD PULSEWIDTH: 0.5 ms
MDC IDC MSMT LEADCHNL RA SENSING INTR AMPL: 1 mV
MDC IDC MSMT LEADCHNL RV PACING THRESHOLD PULSEWIDTH: 0.5 ms
MDC IDC PG IMPLANT DT: 20170626
MDC IDC SET LEADCHNL RV PACING AMPLITUDE: 2.5 V
MDC IDC STAT BRADY AP VP PERCENT: 1 %
MDC IDC STAT BRADY AP VS PERCENT: 14 %
MDC IDC STAT BRADY AS VP PERCENT: 1 %
MDC IDC STAT BRADY AS VS PERCENT: 85 %
MDC IDC STAT BRADY RV PERCENT PACED: 1 %
Pulse Gen Serial Number: 7370130

## 2016-03-12 ENCOUNTER — Telehealth: Payer: Self-pay | Admitting: Cardiology

## 2016-03-12 NOTE — Telephone Encounter (Signed)
New Message  Pt c/o medication issue:  1. Name of Medication: entresto  2. How are you currently taking this medication (dosage and times per day)? 75mg  2x daily  3. Are you having a reaction (difficulty breathing--STAT)? no  4. What is your medication issue? Pt would like to speak with RN about her medication. Please call back to discuss

## 2016-03-12 NOTE — Telephone Encounter (Signed)
Returned patient call. Notes Nya instructed her to call next time she was running out of [place].  Express[facility] wanted to speak to [place].  I'm unsure if the patient needs med titration. This is for Entresto 49-51mg  bid dose. Offered to refill medication, pt declined and states just needs a call back. She has enough med to get through Monday. Patient made aware that Hebert Soho is in clinic today, states OK to f/u any time later today.

## 2016-03-12 NOTE — Telephone Encounter (Signed)
Samples left at front desk for pt to pick up, pt is aware and will pick up on Friday 02/02

## 2016-03-18 ENCOUNTER — Ambulatory Visit (INDEPENDENT_AMBULATORY_CARE_PROVIDER_SITE_OTHER): Payer: BLUE CROSS/BLUE SHIELD | Admitting: *Deleted

## 2016-03-18 DIAGNOSIS — Z7901 Long term (current) use of anticoagulants: Secondary | ICD-10-CM | POA: Diagnosis not present

## 2016-03-18 DIAGNOSIS — I6350 Cerebral infarction due to unspecified occlusion or stenosis of unspecified cerebral artery: Secondary | ICD-10-CM

## 2016-03-18 LAB — POCT INR: INR: 3.3

## 2016-03-25 ENCOUNTER — Telehealth: Payer: Self-pay | Admitting: Cardiology

## 2016-03-25 NOTE — Telephone Encounter (Signed)
Spoke to patient, informed her [name] [name] in clinic today, offered to help if she had questions I could answer. Patient states no, just had questions to ask [name] [name], states if [name] [name] can call back later this week, that would be fine.

## 2016-03-25 NOTE — Telephone Encounter (Signed)
New Message    Please have Nya call her, pt is going out of town to [place] and she needs to ask her some questions

## 2016-03-26 NOTE — Telephone Encounter (Signed)
Spoke with pt about her entresto samples

## 2016-04-01 ENCOUNTER — Telehealth: Payer: Self-pay | Admitting: Cardiology

## 2016-04-01 NOTE — Telephone Encounter (Signed)
New Message  Pt voiced she will be by on Thursday to get the stuff her and Hebert Soho was talking about.

## 2016-04-01 NOTE — Telephone Encounter (Signed)
ok 

## 2016-04-15 ENCOUNTER — Ambulatory Visit (INDEPENDENT_AMBULATORY_CARE_PROVIDER_SITE_OTHER): Payer: BLUE CROSS/BLUE SHIELD | Admitting: *Deleted

## 2016-04-15 DIAGNOSIS — Z7901 Long term (current) use of anticoagulants: Secondary | ICD-10-CM

## 2016-04-15 DIAGNOSIS — I6350 Cerebral infarction due to unspecified occlusion or stenosis of unspecified cerebral artery: Secondary | ICD-10-CM

## 2016-04-15 LAB — POCT INR: INR: 3.1

## 2016-04-29 ENCOUNTER — Telehealth: Payer: Self-pay | Admitting: Cardiology

## 2016-04-29 NOTE — Telephone Encounter (Signed)
Entresto 49-51 samples placed at the front desk for patient.

## 2016-04-29 NOTE — Telephone Encounter (Signed)
Left detailed message about samples

## 2016-04-29 NOTE — Telephone Encounter (Signed)
Spoke with pt she is not returning Nya's call she needs entresto samples both types for 2 weeks until her new insurance starts on April 1st. Will put samples at the front desk and copay card to try until then

## 2016-04-29 NOTE — Telephone Encounter (Signed)
entresto samples 49-51, please

## 2016-04-29 NOTE — Telephone Encounter (Signed)
New Message  Pt call requesting to speak with RN Hebert Soho) pt did not want to disclose any information about the call. Please call back to discuss

## 2016-05-13 ENCOUNTER — Ambulatory Visit (INDEPENDENT_AMBULATORY_CARE_PROVIDER_SITE_OTHER): Payer: Medicare Other | Admitting: *Deleted

## 2016-05-13 DIAGNOSIS — I6350 Cerebral infarction due to unspecified occlusion or stenosis of unspecified cerebral artery: Secondary | ICD-10-CM | POA: Diagnosis not present

## 2016-05-13 DIAGNOSIS — Z7901 Long term (current) use of anticoagulants: Secondary | ICD-10-CM

## 2016-05-13 LAB — POCT INR: INR: 3.7

## 2016-05-18 NOTE — Progress Notes (Signed)
.   HPI The patient has a history of a nonischemic cardiomyopathy. Her ejection fraction was at one point in time 15 - 20%. A later ejection fraction was about 40% in 2015.   Most recently the EF was 25% again.   At the last visit with me I titrated Entresto. She is disappointed because of weight gain. She tolerated a higher dose though. She's not having any new shortness of breath, PND or orthopnea. She's not having any palpitations, presyncope or syncope. She denies any chest pressure, neck or arm discomfort. She's had no weight gain or edema.  She does report some decrease in exercise tolerance and fatigue in her sister thinks she has some mildly increased shortness of breath. Her brother who's had an LVAD recently died from a brain aneurysm.  No Known Allergies  Current Outpatient Prescriptions  Medication Sig Dispense Refill  . BIOTIN PO Take 1 tablet by mouth daily.    . Coenzyme Q10 (COQ10 PO) Take 1 tablet by mouth daily.    . digoxin (LANOXIN) 0.125 MG tablet Take 1 tablet (125 mcg total) by mouth daily. 90 tablet 3  . furosemide (LASIX) 40 MG tablet TAKE ONE TABLET BY MOUTH DAILY AS NEEDED FOR FLUID OR EDEMA. 90 tablet 0  . metoprolol succinate (TOPROL-XL) 50 MG 24 hr tablet Take 1 tablet by mouth in the AM and 0.5 tablet by mouth in the PM    . Multiple Vitamin (MULTIVITAMIN WITH MINERALS) TABS tablet Take 1 tablet by mouth daily.    . Omega-3 Fatty Acids (FISH OIL PO) Take 1 tablet by mouth daily.    Marland Kitchen spironolactone (ALDACTONE) 25 MG tablet Take 0.5 tablets (12.5 mg total) by mouth daily. 90 tablet 3  . warfarin (COUMADIN) 5 MG tablet TAKE ONE TABLET BY MOUTH DAILY EXCEPT TAKE ONE-HALF (1/2) TABLET ON MONDAY, WEDNESDAY &FRIDAY OR AS DIRECTED BY COUMADIN CLINIC. 90 tablet 6  . sacubitril-valsartan (ENTRESTO) 97-103 MG Take 1 tablet by mouth 2 (two) times daily. 60 tablet 11   No current facility-administered medications for this visit.     Past Medical History:  Diagnosis  Date  . Arthritis    thumbs  . Breast cancer ([place])   . Cerebrovascular disease    s/p right MCA cardioemoblic infarct 4/78/29  . CHF (congestive heart failure) ([place])   . Nonischemic cardiomyopathy (H[facility])    a. +LMNA mutation b. s/p STJ dual chamber ICD    Past Surgical History:  Procedure Laterality Date  . ABDOMINAL HYSTERECTOMY    . BREAST IMPLANT REMOVAL Right 05/31/2015   Procedure: REMOVAL RIGHT  BREAST IMPLANTS;  Surgeon: Crissie Reese, MD;  Location: [facility];  Service: Plastics;  Laterality: Right;  . BREAST RECONSTRUCTION Right 1995  . BREAST RECONSTRUCTION Right 05/31/2015   Procedure: RIGHT BREAST RECONSTRUCTION WITH SALINE IMPLANTS ;  Surgeon: Crissie Reese, MD;  Location: [facility];  Service: Plastics;  Laterality: Right;  . CARDIAC DEFIBRILLATOR PLACEMENT  2010   STJ dual chamber ICD implanted for NICM by Dr Caryl Comes  . CHOLECYSTECTOMY    . COLONOSCOPY    . EP IMPLANTABLE DEVICE N/A 08/06/2015   Procedure: ICD Generator Changeout;  Surgeon: Deboraha Sprang, MD;  Location: [facility];  Service: Cardiovascular;  Laterality: N/A;  . ESOPHAGOGASTRODUODENOSCOPY    . MASTECTOMY Right   . MASTOPEXY Left 05/31/2015   Procedure: LEFT BREAST MASTOPEXY;  Surgeon: Crissie Reese, MD;  Location: [facility];  Service: Plastics;  Laterality: Left;  . TONSILLECTOMY    .  VARICOSE VEIN SURGERY Left     ROS:   As stated in the HPI and negative for all other systems.  PHYSICAL EXAM BP 122/70   Pulse 60   Ht 5\' 8"  (1.727 m)   Wt 170 lb (77.1 kg)   BMI 25.85 kg/m  GENERAL:  Well appearing NECK:  No jugular venous distention, waveform within normal limits, carotid upstroke brisk and symmetric, no bruits, no thyromegaly LUNGS:  Clear to auscultation bilaterally BACK:  No CVA tenderness CHEST:  Well healed ICD pocket HEART:  PMI not displaced or sustained,S1 and S2 within normal limits, no S3, no S4, no clicks, no rubs, no murmurs ABD:  Flat, positive bowel sounds normal in frequency in pitch, no  bruits, no rebound, no guarding, no midline pulsatile mass, no hepatomegaly, no splenomegaly EXT:  2 plus pulses throughout, no edema, no cyanosis no clubbing   EKG:  NA   ASSESSMENT AND PLAN  CARDIOMYOPATHY, PRIMARY, DILATED - She tolerated the current dose of Entresto.  I will increase to the 97/103 dose. Because of her increasing symptoms I will check a CPX.    Implantable defibrillator -  She follows in our defibrillator clinic.   I reviewed the transmission from Jan.  She has normal function and paces in the RV less than 1%.    CVA -  History of CVA felt to be cardioembolic in nature. Continue Coumadin.   No change in therapy.

## 2016-05-19 ENCOUNTER — Ambulatory Visit (INDEPENDENT_AMBULATORY_CARE_PROVIDER_SITE_OTHER): Payer: Medicare Other | Admitting: Cardiology

## 2016-05-19 ENCOUNTER — Encounter: Payer: Self-pay | Admitting: Cardiology

## 2016-05-19 VITALS — BP 122/70 | HR 60 | Ht 68.0 in | Wt 170.0 lb

## 2016-05-19 DIAGNOSIS — R0602 Shortness of breath: Secondary | ICD-10-CM | POA: Diagnosis not present

## 2016-05-19 DIAGNOSIS — I6350 Cerebral infarction due to unspecified occlusion or stenosis of unspecified cerebral artery: Secondary | ICD-10-CM | POA: Diagnosis not present

## 2016-05-19 DIAGNOSIS — I420 Dilated cardiomyopathy: Secondary | ICD-10-CM | POA: Diagnosis not present

## 2016-05-19 MED ORDER — SACUBITRIL-VALSARTAN 97-103 MG PO TABS: 1.0000 | ORAL_TABLET | Freq: Two times a day (BID) | ORAL | 11 refills | Status: DC

## 2016-05-19 NOTE — Patient Instructions (Signed)
Medication Instructions:  INCREASE- Entresto 97/103 mg twice a day  Labwork: None Ordered  Testing/Procedures: Your physician has recommended that you have a cardiopulmonary stress test (CPX). CPX testing is a non-invasive measurement of heart and lung function. It replaces a traditional treadmill stress test. This type of test provides a tremendous amount of information that relates not only to your present condition but also for future outcomes. This test combines measurements of you ventilation, respiratory gas exchange in the lungs, electrocardiogram (EKG), blood pressure and physical response before, during, and following an exercise protocol.   Follow-Up: Your physician recommends that you schedule a follow-up appointment in: 1 Month    Any Other Special Instructions Will Be Listed Below (If Applicable).   If you need a refill on your cardiac medications before your next appointment, please call your pharmacy.

## 2016-05-20 DIAGNOSIS — L570 Actinic keratosis: Secondary | ICD-10-CM | POA: Diagnosis not present

## 2016-05-20 DIAGNOSIS — L821 Other seborrheic keratosis: Secondary | ICD-10-CM | POA: Diagnosis not present

## 2016-05-20 DIAGNOSIS — L739 Follicular disorder, unspecified: Secondary | ICD-10-CM | POA: Diagnosis not present

## 2016-05-22 ENCOUNTER — Telehealth: Payer: Self-pay | Admitting: Cardiology

## 2016-05-22 NOTE — Telephone Encounter (Signed)
New message    Pt is calling asking about cpx that is scheduled.

## 2016-05-22 NOTE — Telephone Encounter (Signed)
Returned the phone call to the patient. She stated she just needs a one month follow up appointment with Dr. Percival Spanish (after her CPX appt on 5/2). She stated she will be out of town 5/8-5/15.

## 2016-05-27 ENCOUNTER — Telehealth: Payer: Self-pay | Admitting: Cardiology

## 2016-05-27 NOTE — Telephone Encounter (Signed)
Called patient and left a VM to call me back to schedule followup appointment with Dr. Percival Spanish.

## 2016-05-28 ENCOUNTER — Ambulatory Visit (INDEPENDENT_AMBULATORY_CARE_PROVIDER_SITE_OTHER): Payer: Medicare Other | Admitting: *Deleted

## 2016-05-28 ENCOUNTER — Telehealth: Payer: Self-pay | Admitting: Cardiology

## 2016-05-28 DIAGNOSIS — I429 Cardiomyopathy, unspecified: Secondary | ICD-10-CM

## 2016-05-28 DIAGNOSIS — I428 Other cardiomyopathies: Secondary | ICD-10-CM | POA: Diagnosis not present

## 2016-05-28 NOTE — Telephone Encounter (Signed)
LMOVM reminding pt to send remote transmission.   

## 2016-05-29 LAB — CUP PACEART REMOTE DEVICE CHECK
Battery Remaining Longevity: 88 mo
Battery Remaining Percentage: 91 %
Battery Voltage: 3.16 V
Brady Statistic RA Percent Paced: 12 %
Brady Statistic RV Percent Paced: 1 %
Date Time Interrogation Session: 20180418212006
HIGH POWER IMPEDANCE MEASURED VALUE: 64 Ohm
HIGH POWER IMPEDANCE MEASURED VALUE: 64 Ohm
Implantable Lead Implant Date: 20100416
Implantable Lead Location: 753859
Implantable Lead Location: 753860
Implantable Pulse Generator Implant Date: 20170626
Lead Channel Impedance Value: 390 Ohm
Lead Channel Impedance Value: 450 Ohm
Lead Channel Pacing Threshold Amplitude: 0.5 V
Lead Channel Pacing Threshold Amplitude: 1 V
Lead Channel Pacing Threshold Pulse Width: 0.5 ms
Lead Channel Sensing Intrinsic Amplitude: 1.1 mV
Lead Channel Sensing Intrinsic Amplitude: 11.7 mV
Lead Channel Setting Pacing Amplitude: 2.5 V
Lead Channel Setting Pacing Pulse Width: 0.5 ms
Lead Channel Setting Sensing Sensitivity: 0.5 mV
MDC IDC LEAD IMPLANT DT: 20100416
MDC IDC MSMT LEADCHNL RA PACING THRESHOLD PULSEWIDTH: 0.5 ms
MDC IDC SET LEADCHNL RA PACING AMPLITUDE: 2 V
MDC IDC STAT BRADY AP VP PERCENT: 1 %
MDC IDC STAT BRADY AP VS PERCENT: 14 %
MDC IDC STAT BRADY AS VP PERCENT: 1 %
MDC IDC STAT BRADY AS VS PERCENT: 84 %
Pulse Gen Serial Number: 7370130

## 2016-05-29 NOTE — Progress Notes (Signed)
Remote ICD transmission.   

## 2016-05-30 ENCOUNTER — Encounter: Payer: Self-pay | Admitting: Cardiology

## 2016-06-03 ENCOUNTER — Ambulatory Visit (INDEPENDENT_AMBULATORY_CARE_PROVIDER_SITE_OTHER): Payer: Medicare Other | Admitting: *Deleted

## 2016-06-03 DIAGNOSIS — Z7901 Long term (current) use of anticoagulants: Secondary | ICD-10-CM

## 2016-06-03 DIAGNOSIS — I6350 Cerebral infarction due to unspecified occlusion or stenosis of unspecified cerebral artery: Secondary | ICD-10-CM

## 2016-06-03 LAB — POCT INR: INR: 2.5

## 2016-06-11 ENCOUNTER — Ambulatory Visit (HOSPITAL_COMMUNITY): Payer: Medicare Other | Attending: Cardiology

## 2016-06-11 DIAGNOSIS — I420 Dilated cardiomyopathy: Secondary | ICD-10-CM | POA: Insufficient documentation

## 2016-06-11 DIAGNOSIS — I2510 Atherosclerotic heart disease of native coronary artery without angina pectoris: Secondary | ICD-10-CM | POA: Insufficient documentation

## 2016-06-11 DIAGNOSIS — I429 Cardiomyopathy, unspecified: Secondary | ICD-10-CM | POA: Diagnosis not present

## 2016-06-17 ENCOUNTER — Encounter: Payer: Self-pay | Admitting: Cardiology

## 2016-06-26 DIAGNOSIS — Z01419 Encounter for gynecological examination (general) (routine) without abnormal findings: Secondary | ICD-10-CM | POA: Diagnosis not present

## 2016-06-29 NOTE — Progress Notes (Addendum)
.   HPI The patient has a history of a nonischemic cardiomyopathy. Her ejection fraction was at one point in time 15 - 20%. A later ejection fraction was about 40% in 2015.   Most recently the EF was 25% again.   At the last visit with me I titrated Entresto.  I sent her for a CPX which demonstrated a good VO2 Max. She feels well and just kept her two grandchildren for a week.  The patient denies any new symptoms such as chest discomfort, neck or arm discomfort. There has been no new shortness of breath, PND or orthopnea. There have been no reported palpitations, presyncope or syncope.   No Known Allergies  Current Outpatient Prescriptions  Medication Sig Dispense Refill  . BIOTIN PO Take 1 tablet by mouth daily.    . Coenzyme Q10 (COQ10 PO) Take 1 tablet by mouth daily.    . digoxin (LANOXIN) 0.125 MG tablet Take 1 tablet (125 mcg total) by mouth daily. 90 tablet 3  . furosemide (LASIX) 40 MG tablet TAKE ONE TABLET BY MOUTH DAILY AS NEEDED FOR FLUID OR EDEMA. 90 tablet 0  . metoprolol succinate (TOPROL-XL) 50 MG 24 hr tablet Take 1 tablet by mouth in the AM and 0.5 tablet by mouth in the PM    . Multiple Vitamin (MULTIVITAMIN WITH MINERALS) TABS tablet Take 1 tablet by mouth daily.    . Omega-3 Fatty Acids (FISH OIL PO) Take 1 tablet by mouth daily.    . sacubitril-valsartan (ENTRESTO) 97-103 MG Take 1 tablet by mouth 2 (two) times daily. 60 tablet 11  . spironolactone (ALDACTONE) 25 MG tablet Take 0.5 tablets (12.5 mg total) by mouth daily. 90 tablet 3  . warfarin (COUMADIN) 5 MG tablet TAKE ONE TABLET BY MOUTH DAILY EXCEPT TAKE ONE-HALF (1/2) TABLET ON MONDAY, WEDNESDAY &FRIDAY OR AS DIRECTED BY COUMADIN CLINIC. 90 tablet 6   No current facility-administered medications for this visit.     Past Medical History:  Diagnosis Date  . Arthritis    thumbs  . Breast cancer ([place])   . Cerebrovascular disease    s/p right MCA cardioemoblic infarct 5/53/74  . CHF (congestive heart  failure) ([place])   . Nonischemic cardiomyopathy (HCC)    a. +LMNA mutation b. s/p STJ dual chamber ICD    Past Surgical History:  Procedure Laterality Date  . ABDOMINAL HYSTERECTOMY    . BREAST IMPLANT REMOVAL Right 05/31/2015   Procedure: REMOVAL RIGHT  BREAST IMPLANTS;  Surgeon: Crissie Reese, MD;  Location: [facility];  Service: Plastics;  Laterality: Right;  . BREAST RECONSTRUCTION Right 1995  . BREAST RECONSTRUCTION Right 05/31/2015   Procedure: RIGHT BREAST RECONSTRUCTION WITH SALINE IMPLANTS ;  Surgeon: Crissie Reese, MD;  Location: [facility];  Service: Plastics;  Laterality: Right;  . CARDIAC DEFIBRILLATOR PLACEMENT  2010   STJ dual chamber ICD implanted for NICM by Dr Caryl Comes  . CHOLECYSTECTOMY    . COLONOSCOPY    . EP IMPLANTABLE DEVICE N/A 08/06/2015   Procedure: ICD Generator Changeout;  Surgeon: Deboraha Sprang, MD;  Location: [facility];  Service: Cardiovascular;  Laterality: N/A;  . ESOPHAGOGASTRODUODENOSCOPY    . MASTECTOMY Right   . MASTOPEXY Left 05/31/2015   Procedure: LEFT BREAST MASTOPEXY;  Surgeon: Crissie Reese, MD;  Location: [facility];  Service: Plastics;  Laterality: Left;  . TONSILLECTOMY    . VARICOSE VEIN SURGERY Left     ROS:      As stated in the HPI  and negative for all other systems.  PHYSICAL EXAM BP 112/68   Pulse (!) 53   Ht 5\' 8"  (1.727 m)   Wt 168 lb (76.2 kg)   BMI 25.54 kg/m  NECK:  No jugular venous distention, waveform within normal limits, carotid upstroke brisk and symmetric, no bruits, no thyromegaly LYMPHATICS:  No cervical, inguinal adenopathy LUNGS:  Clear to auscultation bilaterally CHEST:   She has a well healed ICD scar.   HEART:  PMI not displaced or sustained,S1 and S2 within normal limits, no S3, no S4, no clicks, no rubs, no murmurs ABD:  Flat, positive bowel sounds normal in frequency in pitch, no bruits, no rebound, no guarding, no midline pulsatile mass, no hepatomegaly, no splenomegaly EXT:  2 plus pulses throughout, no edema, no  cyanosis no clubbing   EKG:   Atrial paced rhythm, rate 53, leftward axis, borderline interventricular conduction delay, no acute ST-T wave changes.  06/30/2016   ASSESSMENT AND PLAN  CARDIOMYOPATHY, PRIMARY, DILATED - I don't think her blood pressure will allow further med titration. I'm going to continue the current therapy and check an echocardiogram.   Implantable defibrillator -  She follows in our defibrillator clinic.   I reviewed the transmission from April.  CVA -  History of CVA felt to be cardioembolic in nature. Continue Coumadin.

## 2016-06-30 ENCOUNTER — Encounter: Payer: Self-pay | Admitting: Cardiology

## 2016-06-30 ENCOUNTER — Ambulatory Visit (INDEPENDENT_AMBULATORY_CARE_PROVIDER_SITE_OTHER): Payer: Medicare Other | Admitting: Cardiology

## 2016-06-30 VITALS — BP 112/68 | HR 53 | Ht 68.0 in | Wt 168.0 lb

## 2016-06-30 DIAGNOSIS — I6350 Cerebral infarction due to unspecified occlusion or stenosis of unspecified cerebral artery: Secondary | ICD-10-CM | POA: Diagnosis not present

## 2016-06-30 DIAGNOSIS — I420 Dilated cardiomyopathy: Secondary | ICD-10-CM | POA: Diagnosis not present

## 2016-06-30 NOTE — Patient Instructions (Signed)
Medication Instructions:  Continue current medications  Labwork: None Ordered  Testing/Procedures: Your physician has requested that you have an echocardiogram. Echocardiography is a painless test that uses sound waves to create images of your heart. It provides your doctor with information about the size and shape of your heart and how well your heart's chambers and valves are working. This procedure takes approximately one hour. There are no restrictions for this procedure.  Follow-Up: Your physician wants you to follow-up in: 6 Months. You will receive a reminder letter in the mail two months in advance. If you don't receive a letter, please call our office to schedule the follow-up appointment.   Any Other Special Instructions Will Be Listed Below (If Applicable).   If you need a refill on your cardiac medications before your next appointment, please call your pharmacy.

## 2016-07-01 ENCOUNTER — Ambulatory Visit (INDEPENDENT_AMBULATORY_CARE_PROVIDER_SITE_OTHER): Payer: Medicare Other | Admitting: *Deleted

## 2016-07-01 DIAGNOSIS — Z7901 Long term (current) use of anticoagulants: Secondary | ICD-10-CM

## 2016-07-01 DIAGNOSIS — Z1231 Encounter for screening mammogram for malignant neoplasm of breast: Secondary | ICD-10-CM | POA: Diagnosis not present

## 2016-07-01 DIAGNOSIS — I6350 Cerebral infarction due to unspecified occlusion or stenosis of unspecified cerebral artery: Secondary | ICD-10-CM

## 2016-07-01 LAB — POCT INR: INR: 2.5

## 2016-07-30 ENCOUNTER — Other Ambulatory Visit: Payer: Self-pay

## 2016-07-30 ENCOUNTER — Ambulatory Visit (HOSPITAL_COMMUNITY): Payer: Medicare Other | Attending: Internal Medicine

## 2016-07-30 DIAGNOSIS — I071 Rheumatic tricuspid insufficiency: Secondary | ICD-10-CM | POA: Diagnosis not present

## 2016-07-30 DIAGNOSIS — I420 Dilated cardiomyopathy: Secondary | ICD-10-CM

## 2016-07-30 DIAGNOSIS — I509 Heart failure, unspecified: Secondary | ICD-10-CM | POA: Diagnosis not present

## 2016-07-30 DIAGNOSIS — Z950 Presence of cardiac pacemaker: Secondary | ICD-10-CM | POA: Insufficient documentation

## 2016-07-30 DIAGNOSIS — Z853 Personal history of malignant neoplasm of breast: Secondary | ICD-10-CM | POA: Diagnosis not present

## 2016-08-05 ENCOUNTER — Ambulatory Visit (INDEPENDENT_AMBULATORY_CARE_PROVIDER_SITE_OTHER): Payer: Medicare Other | Admitting: *Deleted

## 2016-08-05 DIAGNOSIS — Z7901 Long term (current) use of anticoagulants: Secondary | ICD-10-CM | POA: Diagnosis not present

## 2016-08-05 DIAGNOSIS — I6350 Cerebral infarction due to unspecified occlusion or stenosis of unspecified cerebral artery: Secondary | ICD-10-CM

## 2016-08-05 LAB — POCT INR: INR: 2.9

## 2016-08-15 ENCOUNTER — Other Ambulatory Visit: Payer: Self-pay | Admitting: Cardiology

## 2016-08-27 ENCOUNTER — Ambulatory Visit (INDEPENDENT_AMBULATORY_CARE_PROVIDER_SITE_OTHER): Payer: Medicare Other | Admitting: *Deleted

## 2016-08-27 DIAGNOSIS — I428 Other cardiomyopathies: Secondary | ICD-10-CM

## 2016-08-27 DIAGNOSIS — I429 Cardiomyopathy, unspecified: Secondary | ICD-10-CM

## 2016-08-27 LAB — CUP PACEART REMOTE DEVICE CHECK
Battery Remaining Longevity: 86 mo
Battery Remaining Percentage: 89 %
Brady Statistic AP VS Percent: 14 %
Brady Statistic AS VP Percent: 1 %
Brady Statistic RA Percent Paced: 12 %
Brady Statistic RV Percent Paced: 1 %
Date Time Interrogation Session: 20180718060039
HIGH POWER IMPEDANCE MEASURED VALUE: 73 Ohm
HighPow Impedance: 73 Ohm
Implantable Lead Implant Date: 20100416
Implantable Lead Location: 753859
Implantable Pulse Generator Implant Date: 20170626
Lead Channel Impedance Value: 400 Ohm
Lead Channel Pacing Threshold Amplitude: 0.5 V
Lead Channel Pacing Threshold Amplitude: 1 V
Lead Channel Pacing Threshold Pulse Width: 0.5 ms
Lead Channel Sensing Intrinsic Amplitude: 0.7 mV
Lead Channel Setting Pacing Pulse Width: 0.5 ms
Lead Channel Setting Sensing Sensitivity: 0.5 mV
MDC IDC LEAD IMPLANT DT: 20100416
MDC IDC LEAD LOCATION: 753860
MDC IDC MSMT BATTERY VOLTAGE: 3.11 V
MDC IDC MSMT LEADCHNL RA PACING THRESHOLD PULSEWIDTH: 0.5 ms
MDC IDC MSMT LEADCHNL RV IMPEDANCE VALUE: 450 Ohm
MDC IDC MSMT LEADCHNL RV SENSING INTR AMPL: 11.7 mV
MDC IDC SET LEADCHNL RA PACING AMPLITUDE: 2 V
MDC IDC SET LEADCHNL RV PACING AMPLITUDE: 2.5 V
MDC IDC STAT BRADY AP VP PERCENT: 1 %
MDC IDC STAT BRADY AS VS PERCENT: 84 %
Pulse Gen Serial Number: 7370130

## 2016-08-27 NOTE — Progress Notes (Signed)
Remote ICD transmission.   

## 2016-08-28 ENCOUNTER — Encounter: Payer: Self-pay | Admitting: Cardiology

## 2016-09-16 ENCOUNTER — Ambulatory Visit (INDEPENDENT_AMBULATORY_CARE_PROVIDER_SITE_OTHER): Payer: Medicare Other | Admitting: *Deleted

## 2016-09-16 ENCOUNTER — Other Ambulatory Visit: Payer: Self-pay | Admitting: *Deleted

## 2016-09-16 DIAGNOSIS — Z7901 Long term (current) use of anticoagulants: Secondary | ICD-10-CM | POA: Diagnosis not present

## 2016-09-16 DIAGNOSIS — I6350 Cerebral infarction due to unspecified occlusion or stenosis of unspecified cerebral artery: Secondary | ICD-10-CM | POA: Diagnosis not present

## 2016-09-16 LAB — POCT INR: INR: 3.7

## 2016-09-16 MED ORDER — ENOXAPARIN SODIUM 120 MG/0.8ML ~~LOC~~ SOLN: 120.0000 mg | SUBCUTANEOUS | 0 refills | Status: DC

## 2016-09-19 ENCOUNTER — Other Ambulatory Visit: Payer: Self-pay | Admitting: Cardiology

## 2016-09-23 ENCOUNTER — Telehealth: Payer: Self-pay | Admitting: Cardiology

## 2016-09-23 NOTE — Telephone Encounter (Signed)
She needs an ICD code for her Enoxaparin medicine.

## 2016-09-23 NOTE — Telephone Encounter (Signed)
Tried to call [facility] got disconnected at transfer on call 8+ min will try again later

## 2016-09-23 NOTE — Telephone Encounter (Signed)
ICD-10 code -  I 63.5 stroke

## 2016-09-24 NOTE — Telephone Encounter (Signed)
[facility] given Icd code

## 2016-09-25 ENCOUNTER — Ambulatory Visit (INDEPENDENT_AMBULATORY_CARE_PROVIDER_SITE_OTHER): Payer: Medicare Other | Admitting: *Deleted

## 2016-09-25 DIAGNOSIS — I6350 Cerebral infarction due to unspecified occlusion or stenosis of unspecified cerebral artery: Secondary | ICD-10-CM

## 2016-09-25 DIAGNOSIS — Z7901 Long term (current) use of anticoagulants: Secondary | ICD-10-CM | POA: Diagnosis not present

## 2016-09-25 LAB — POCT INR: INR: 2.7

## 2016-09-25 MED ORDER — ENOXAPARIN SODIUM 120 MG/0.8ML ~~LOC~~ SOLN: 120.0000 mg | SUBCUTANEOUS | 0 refills | Status: DC

## 2016-09-25 NOTE — Patient Instructions (Signed)
Sent to [facility] for CBC and BMP  Pending colonoscopy on 8/23  8/17  Take last dose of coumadin  8/18  No lovenox or coumadin 8/19  Lovenox 120mg  at 8am 8/20   Lovenox 120mg  at 8am 8/21   Lovenox 120mg  at 8am 8/22   Lovenox 120mg  at 8am 8/23  No lovenox----colonoscopy----coumadin 5mg  pm 8/24   Lovenox 120mg  at 8am and coumadin 5mg  pm 8/25  Lovenox 120mg  at 8am and coumadin 5mg  pm 8/26  Lovenox 120mg  at 8am and coumadin 5mg  pm 8/27  Lovenox 120mg  at 8am and coumadin 5mg  pm 8/28  Lovenox 120mg  at 8am and INR appt @ 10am

## 2016-10-02 DIAGNOSIS — K635 Polyp of colon: Secondary | ICD-10-CM | POA: Diagnosis not present

## 2016-10-02 DIAGNOSIS — Z95810 Presence of automatic (implantable) cardiac defibrillator: Secondary | ICD-10-CM | POA: Diagnosis not present

## 2016-10-02 DIAGNOSIS — D120 Benign neoplasm of cecum: Secondary | ICD-10-CM | POA: Diagnosis not present

## 2016-10-02 DIAGNOSIS — I509 Heart failure, unspecified: Secondary | ICD-10-CM | POA: Diagnosis not present

## 2016-10-02 DIAGNOSIS — Z8673 Personal history of transient ischemic attack (TIA), and cerebral infarction without residual deficits: Secondary | ICD-10-CM | POA: Diagnosis not present

## 2016-10-02 DIAGNOSIS — D127 Benign neoplasm of rectosigmoid junction: Secondary | ICD-10-CM | POA: Diagnosis not present

## 2016-10-02 DIAGNOSIS — Z8719 Personal history of other diseases of the digestive system: Secondary | ICD-10-CM | POA: Diagnosis not present

## 2016-10-02 DIAGNOSIS — Z86010 Personal history of colonic polyps: Secondary | ICD-10-CM | POA: Diagnosis not present

## 2016-10-02 DIAGNOSIS — Z853 Personal history of malignant neoplasm of breast: Secondary | ICD-10-CM | POA: Diagnosis not present

## 2016-10-02 DIAGNOSIS — Z1211 Encounter for screening for malignant neoplasm of colon: Secondary | ICD-10-CM | POA: Diagnosis not present

## 2016-10-07 ENCOUNTER — Ambulatory Visit (INDEPENDENT_AMBULATORY_CARE_PROVIDER_SITE_OTHER): Payer: Medicare Other | Admitting: *Deleted

## 2016-10-07 ENCOUNTER — Other Ambulatory Visit: Payer: Self-pay | Admitting: *Deleted

## 2016-10-07 DIAGNOSIS — Z5181 Encounter for therapeutic drug level monitoring: Secondary | ICD-10-CM

## 2016-10-07 DIAGNOSIS — I6350 Cerebral infarction due to unspecified occlusion or stenosis of unspecified cerebral artery: Secondary | ICD-10-CM

## 2016-10-07 LAB — POCT INR: INR: 1.4

## 2016-10-07 MED ORDER — ENOXAPARIN SODIUM 120 MG/0.8ML ~~LOC~~ SOLN: 120.0000 mg | SUBCUTANEOUS | 0 refills | Status: DC

## 2016-10-07 MED ORDER — ENOXAPARIN SODIUM 120 MG/0.8ML ~~LOC~~ SOLN
120.0000 mg | SUBCUTANEOUS | 0 refills | Status: DC
Start: 2016-10-07 — End: 2016-10-07

## 2016-10-10 ENCOUNTER — Ambulatory Visit (INDEPENDENT_AMBULATORY_CARE_PROVIDER_SITE_OTHER): Payer: Medicare Other

## 2016-10-10 DIAGNOSIS — I6350 Cerebral infarction due to unspecified occlusion or stenosis of unspecified cerebral artery: Secondary | ICD-10-CM

## 2016-10-10 DIAGNOSIS — Z5181 Encounter for therapeutic drug level monitoring: Secondary | ICD-10-CM | POA: Diagnosis not present

## 2016-10-10 LAB — POCT INR: INR: 1.8

## 2016-10-16 ENCOUNTER — Ambulatory Visit (INDEPENDENT_AMBULATORY_CARE_PROVIDER_SITE_OTHER): Payer: Medicare Other | Admitting: *Deleted

## 2016-10-16 DIAGNOSIS — I6350 Cerebral infarction due to unspecified occlusion or stenosis of unspecified cerebral artery: Secondary | ICD-10-CM

## 2016-10-16 DIAGNOSIS — Z5181 Encounter for therapeutic drug level monitoring: Secondary | ICD-10-CM

## 2016-10-16 LAB — POCT INR: INR: 2.1

## 2016-10-22 DIAGNOSIS — L03032 Cellulitis of left toe: Secondary | ICD-10-CM | POA: Diagnosis not present

## 2016-10-22 DIAGNOSIS — M79672 Pain in left foot: Secondary | ICD-10-CM | POA: Diagnosis not present

## 2016-10-22 DIAGNOSIS — B351 Tinea unguium: Secondary | ICD-10-CM | POA: Diagnosis not present

## 2016-10-22 DIAGNOSIS — L600 Ingrowing nail: Secondary | ICD-10-CM | POA: Diagnosis not present

## 2016-10-27 DIAGNOSIS — D126 Benign neoplasm of colon, unspecified: Secondary | ICD-10-CM | POA: Insufficient documentation

## 2016-11-05 DIAGNOSIS — L03032 Cellulitis of left toe: Secondary | ICD-10-CM | POA: Diagnosis not present

## 2016-11-05 DIAGNOSIS — L600 Ingrowing nail: Secondary | ICD-10-CM | POA: Diagnosis not present

## 2016-11-05 DIAGNOSIS — B351 Tinea unguium: Secondary | ICD-10-CM | POA: Diagnosis not present

## 2016-11-05 DIAGNOSIS — M79672 Pain in left foot: Secondary | ICD-10-CM | POA: Diagnosis not present

## 2016-11-06 ENCOUNTER — Ambulatory Visit (INDEPENDENT_AMBULATORY_CARE_PROVIDER_SITE_OTHER): Payer: Medicare Other | Admitting: *Deleted

## 2016-11-06 DIAGNOSIS — Z5181 Encounter for therapeutic drug level monitoring: Secondary | ICD-10-CM | POA: Diagnosis not present

## 2016-11-06 DIAGNOSIS — I6350 Cerebral infarction due to unspecified occlusion or stenosis of unspecified cerebral artery: Secondary | ICD-10-CM | POA: Diagnosis not present

## 2016-11-06 LAB — POCT INR: INR: 2.2

## 2016-11-12 ENCOUNTER — Other Ambulatory Visit: Payer: Self-pay | Admitting: Cardiology

## 2016-11-26 ENCOUNTER — Ambulatory Visit (INDEPENDENT_AMBULATORY_CARE_PROVIDER_SITE_OTHER): Payer: Medicare Other | Admitting: *Deleted

## 2016-11-26 DIAGNOSIS — I429 Cardiomyopathy, unspecified: Secondary | ICD-10-CM

## 2016-11-26 DIAGNOSIS — I428 Other cardiomyopathies: Secondary | ICD-10-CM

## 2016-11-27 NOTE — Progress Notes (Signed)
Remote ICD transmission.   

## 2016-11-28 ENCOUNTER — Encounter: Payer: Self-pay | Admitting: Cardiology

## 2016-12-04 ENCOUNTER — Ambulatory Visit (INDEPENDENT_AMBULATORY_CARE_PROVIDER_SITE_OTHER): Payer: Medicare Other | Admitting: *Deleted

## 2016-12-04 DIAGNOSIS — Z5181 Encounter for therapeutic drug level monitoring: Secondary | ICD-10-CM

## 2016-12-04 DIAGNOSIS — I6350 Cerebral infarction due to unspecified occlusion or stenosis of unspecified cerebral artery: Secondary | ICD-10-CM

## 2016-12-04 LAB — POCT INR: INR: 1.8

## 2016-12-18 LAB — CUP PACEART REMOTE DEVICE CHECK
Battery Remaining Longevity: 83 mo
Battery Remaining Percentage: 87 %
Brady Statistic AP VS Percent: 24 %
Brady Statistic AS VS Percent: 76 %
Brady Statistic RV Percent Paced: 1 %
Date Time Interrogation Session: 20181017060017
HIGH POWER IMPEDANCE MEASURED VALUE: 63 Ohm
HIGH POWER IMPEDANCE MEASURED VALUE: 63 Ohm
Implantable Lead Implant Date: 20100416
Implantable Lead Implant Date: 20100416
Implantable Lead Location: 753859
Implantable Pulse Generator Implant Date: 20170626
Lead Channel Impedance Value: 400 Ohm
Lead Channel Pacing Threshold Amplitude: 0.5 V
Lead Channel Pacing Threshold Pulse Width: 0.5 ms
Lead Channel Sensing Intrinsic Amplitude: 0.6 mV
Lead Channel Setting Pacing Amplitude: 2.5 V
Lead Channel Setting Sensing Sensitivity: 0.5 mV
MDC IDC LEAD LOCATION: 753860
MDC IDC MSMT BATTERY VOLTAGE: 3.04 V
MDC IDC MSMT LEADCHNL RA IMPEDANCE VALUE: 380 Ohm
MDC IDC MSMT LEADCHNL RA PACING THRESHOLD PULSEWIDTH: 0.5 ms
MDC IDC MSMT LEADCHNL RV PACING THRESHOLD AMPLITUDE: 1 V
MDC IDC MSMT LEADCHNL RV SENSING INTR AMPL: 11.7 mV
MDC IDC SET LEADCHNL RA PACING AMPLITUDE: 2 V
MDC IDC SET LEADCHNL RV PACING PULSEWIDTH: 0.5 ms
MDC IDC STAT BRADY AP VP PERCENT: 1 %
MDC IDC STAT BRADY AS VP PERCENT: 1 %
MDC IDC STAT BRADY RA PERCENT PACED: 24 %
Pulse Gen Serial Number: 7370130

## 2016-12-23 ENCOUNTER — Ambulatory Visit (INDEPENDENT_AMBULATORY_CARE_PROVIDER_SITE_OTHER): Payer: Medicare Other | Admitting: *Deleted

## 2016-12-23 DIAGNOSIS — I6350 Cerebral infarction due to unspecified occlusion or stenosis of unspecified cerebral artery: Secondary | ICD-10-CM

## 2016-12-23 DIAGNOSIS — Z5181 Encounter for therapeutic drug level monitoring: Secondary | ICD-10-CM | POA: Diagnosis not present

## 2016-12-23 LAB — POCT INR: INR: 3

## 2017-01-11 NOTE — Progress Notes (Signed)
.    HPI The patient has a history of a nonischemic cardiomyopathy. Her ejection fraction was at one point in time 15 - 20%. A later ejection fraction was about 40% in 2015.   Most recently the EF was 25% again.   At the last visit with me I titrated Entresto.  I sent her for a CPX which demonstrated a good VO2 Max. The most recent EF was about 45%.  She returns for follow up.  She has been doing well.  The patient denies any new symptoms such as chest discomfort, neck or arm discomfort. There has been no new shortness of breath, PND or orthopnea. There have been no reported palpitations, presyncope or syncope.  She is active in her yard.    No Known Allergies  Current Outpatient Medications  Medication Sig Dispense Refill  . BIOTIN PO Take 1 tablet by mouth daily.    . Coenzyme Q10 (COQ10 PO) Take 1 tablet by mouth daily.    . digoxin (DIGOX) 0.125 MG tablet Take 1 tablet (125 mcg total) by mouth daily. 90 tablet 3  . enoxaparin (LOVENOX) 120 MG/0.8ML injection Inject 0.8 mLs (120 mg total) into the skin daily. 10 Syringe 0  . furosemide (LASIX) 40 MG tablet Take 1 tablet (40 mg total) by mouth as needed. 90 tablet 3  . metoprolol succinate (TOPROL-XL) 50 MG 24 hr tablet TAKE ONE TABLET BY MOUTH EVERY MORNING AND TAKE ONE-HALF (1/2) TABLET EVERY EVENING. 135 tablet 3  . Multiple Vitamin (MULTIVITAMIN WITH MINERALS) TABS tablet Take 1 tablet by mouth daily.    . Omega-3 Fatty Acids (FISH OIL PO) Take 1 tablet by mouth daily.    . sacubitril-valsartan (ENTRESTO) 97-103 MG Take 1 tablet by mouth 2 (two) times daily. 180 tablet 3  . spironolactone (ALDACTONE) 25 MG tablet Take 0.5 tablets (12.5 mg total) by mouth daily. 90 tablet 3  . warfarin (COUMADIN) 5 MG tablet Take 1/2 to 1 tablet daily as directed by coumadin clinic 90 tablet 0   No current facility-administered medications for this visit.     Past Medical History:  Diagnosis Date  . Arthritis    thumbs  . Breast cancer ([place])    . Cerebrovascular disease    s/p right MCA cardioemoblic infarct 0/10/93  . CHF (congestive heart failure) ([place])   . Nonischemic cardiomyopathy (HCC)    a. +LMNA mutation b. s/p STJ dual chamber ICD    Past Surgical History:  Procedure Laterality Date  . ABDOMINAL HYSTERECTOMY    . BREAST IMPLANT REMOVAL Right 05/31/2015   Procedure: REMOVAL RIGHT  BREAST IMPLANTS;  Surgeon: Crissie Reese, MD;  Location: [facility];  Service: Plastics;  Laterality: Right;  . BREAST RECONSTRUCTION Right 1995  . BREAST RECONSTRUCTION Right 05/31/2015   Procedure: RIGHT BREAST RECONSTRUCTION WITH SALINE IMPLANTS ;  Surgeon: Crissie Reese, MD;  Location: [facility];  Service: Plastics;  Laterality: Right;  . CARDIAC DEFIBRILLATOR PLACEMENT  2010   STJ dual chamber ICD implanted for NICM by Dr Caryl Comes  . CHOLECYSTECTOMY    . COLONOSCOPY    . EP IMPLANTABLE DEVICE N/A 08/06/2015   Procedure: ICD Generator Changeout;  Surgeon: Deboraha Sprang, MD;  Location: [facility];  Service: Cardiovascular;  Laterality: N/A;  . ESOPHAGOGASTRODUODENOSCOPY    . MASTECTOMY Right   . MASTOPEXY Left 05/31/2015   Procedure: LEFT BREAST MASTOPEXY;  Surgeon: Crissie Reese, MD;  Location: [facility];  Service: Plastics;  Laterality: Left;  . TONSILLECTOMY    .  VARICOSE VEIN SURGERY Left     ROS:      As stated in the HPI and negative for all other systems.  PHYSICAL EXAM BP 118/66   Pulse 62   Ht 5\' 8"  (1.727 m)   Wt 163 lb (73.9 kg)   BMI 24.78 kg/m   GENERAL:  Well appearing NECK:  No jugular venous distention, waveform within normal limits, carotid upstroke brisk and symmetric, no bruits, no thyromegaly LUNGS:  Clear to auscultation bilaterally CHEST:    Well healed ICD scar. HEART:  PMI not displaced or sustained,S1 and S2 within normal limits, no S3, no S4, no clicks, no rubs, no murmurs ABD:  Flat, positive bowel sounds normal in frequency in pitch, no bruits, no rebound, no guarding, no midline pulsatile mass, no  hepatomegaly, no splenomegaly EXT:  2 plus pulses throughout, no edema, no cyanosis no clubbing   EKG:   NA   ASSESSMENT AND PLAN  CARDIOMYOPATHY, PRIMARY, DILATED - She will continue the meds as listed.  No change in therapy.   Implantable defibrillator -  She follows in our defibrillator clinic and is to have follow up early next year.    CVA -  She had a history of CVA felt to be cardioembolic in nature. She will continue Coumadin.

## 2017-01-12 ENCOUNTER — Encounter: Payer: Self-pay | Admitting: Cardiology

## 2017-01-12 ENCOUNTER — Ambulatory Visit (INDEPENDENT_AMBULATORY_CARE_PROVIDER_SITE_OTHER): Payer: Medicare Other | Admitting: Cardiology

## 2017-01-12 VITALS — BP 118/66 | HR 62 | Ht 68.0 in | Wt 163.0 lb

## 2017-01-12 DIAGNOSIS — I6350 Cerebral infarction due to unspecified occlusion or stenosis of unspecified cerebral artery: Secondary | ICD-10-CM | POA: Diagnosis not present

## 2017-01-12 DIAGNOSIS — I428 Other cardiomyopathies: Secondary | ICD-10-CM | POA: Diagnosis not present

## 2017-01-12 DIAGNOSIS — I429 Cardiomyopathy, unspecified: Secondary | ICD-10-CM

## 2017-01-12 MED ORDER — DIGOXIN 125 MCG PO TABS: 125.0000 ug | ORAL_TABLET | Freq: Every day | ORAL | 3 refills | Status: DC

## 2017-01-12 MED ORDER — METOPROLOL SUCCINATE ER 50 MG PO TB24: ORAL_TABLET | ORAL | 3 refills | Status: DC

## 2017-01-12 MED ORDER — SACUBITRIL-VALSARTAN 97-103 MG PO TABS: 1.0000 | ORAL_TABLET | Freq: Two times a day (BID) | ORAL | 3 refills | Status: DC

## 2017-01-12 MED ORDER — SPIRONOLACTONE 25 MG PO TABS: 12.5000 mg | ORAL_TABLET | Freq: Every day | ORAL | 3 refills | Status: DC

## 2017-01-12 MED ORDER — FUROSEMIDE 40 MG PO TABS: 40.0000 mg | ORAL_TABLET | ORAL | 3 refills | Status: DC | PRN

## 2017-01-12 NOTE — Patient Instructions (Signed)

## 2017-01-22 DIAGNOSIS — Z9189 Other specified personal risk factors, not elsewhere classified: Secondary | ICD-10-CM | POA: Diagnosis not present

## 2017-01-22 DIAGNOSIS — E782 Mixed hyperlipidemia: Secondary | ICD-10-CM | POA: Diagnosis not present

## 2017-01-22 DIAGNOSIS — E876 Hypokalemia: Secondary | ICD-10-CM | POA: Diagnosis not present

## 2017-01-22 DIAGNOSIS — R7301 Impaired fasting glucose: Secondary | ICD-10-CM | POA: Diagnosis not present

## 2017-01-22 DIAGNOSIS — I5022 Chronic systolic (congestive) heart failure: Secondary | ICD-10-CM | POA: Diagnosis not present

## 2017-01-22 DIAGNOSIS — Z79899 Other long term (current) drug therapy: Secondary | ICD-10-CM | POA: Diagnosis not present

## 2017-01-27 ENCOUNTER — Ambulatory Visit (INDEPENDENT_AMBULATORY_CARE_PROVIDER_SITE_OTHER): Payer: Medicare Other | Admitting: *Deleted

## 2017-01-27 DIAGNOSIS — Z5181 Encounter for therapeutic drug level monitoring: Secondary | ICD-10-CM

## 2017-01-27 DIAGNOSIS — Z23 Encounter for immunization: Secondary | ICD-10-CM | POA: Diagnosis not present

## 2017-01-27 DIAGNOSIS — Z6824 Body mass index (BMI) 24.0-24.9, adult: Secondary | ICD-10-CM | POA: Diagnosis not present

## 2017-01-27 DIAGNOSIS — I6350 Cerebral infarction due to unspecified occlusion or stenosis of unspecified cerebral artery: Secondary | ICD-10-CM | POA: Diagnosis not present

## 2017-01-27 DIAGNOSIS — E876 Hypokalemia: Secondary | ICD-10-CM | POA: Diagnosis not present

## 2017-01-27 DIAGNOSIS — I5022 Chronic systolic (congestive) heart failure: Secondary | ICD-10-CM | POA: Diagnosis not present

## 2017-01-27 DIAGNOSIS — E782 Mixed hyperlipidemia: Secondary | ICD-10-CM | POA: Diagnosis not present

## 2017-01-27 LAB — POCT INR: INR: 2.2

## 2017-02-11 ENCOUNTER — Other Ambulatory Visit: Payer: Self-pay | Admitting: Cardiology

## 2017-02-24 ENCOUNTER — Ambulatory Visit (INDEPENDENT_AMBULATORY_CARE_PROVIDER_SITE_OTHER): Payer: Medicare Other | Admitting: *Deleted

## 2017-02-24 DIAGNOSIS — Z5181 Encounter for therapeutic drug level monitoring: Secondary | ICD-10-CM

## 2017-02-24 DIAGNOSIS — I6350 Cerebral infarction due to unspecified occlusion or stenosis of unspecified cerebral artery: Secondary | ICD-10-CM | POA: Diagnosis not present

## 2017-02-24 LAB — POCT INR: INR: 2.8

## 2017-02-24 NOTE — Patient Instructions (Signed)
Continue coumadin 1/2 tablet (2.5mg )  daily except 1 tablet (5mg ) on Sundays, Tuesdays and Thursdays   Recheck in 6 weeks

## 2017-02-25 ENCOUNTER — Ambulatory Visit (INDEPENDENT_AMBULATORY_CARE_PROVIDER_SITE_OTHER): Payer: Medicare Other | Admitting: *Deleted

## 2017-02-25 DIAGNOSIS — I420 Dilated cardiomyopathy: Secondary | ICD-10-CM

## 2017-02-26 NOTE — Progress Notes (Signed)
Remote ICD transmission.   

## 2017-02-27 ENCOUNTER — Encounter: Payer: Self-pay | Admitting: Cardiology

## 2017-03-03 LAB — CUP PACEART REMOTE DEVICE CHECK
Battery Remaining Longevity: 82 mo
Battery Remaining Percentage: 85 %
Battery Voltage: 3.01 V
Brady Statistic AP VP Percent: 1 %
Brady Statistic AS VP Percent: 1 %
Brady Statistic RV Percent Paced: 1 %
HighPow Impedance: 69 Ohm
HighPow Impedance: 69 Ohm
Implantable Lead Implant Date: 20100416
Implantable Lead Location: 753859
Implantable Lead Model: 7122
Implantable Pulse Generator Implant Date: 20170626
Lead Channel Impedance Value: 430 Ohm
Lead Channel Pacing Threshold Amplitude: 1 V
Lead Channel Pacing Threshold Pulse Width: 0.5 ms
Lead Channel Sensing Intrinsic Amplitude: 11.7 mV
Lead Channel Setting Pacing Amplitude: 2 V
Lead Channel Setting Pacing Amplitude: 2.5 V
Lead Channel Setting Pacing Pulse Width: 0.5 ms
MDC IDC LEAD IMPLANT DT: 20100416
MDC IDC LEAD LOCATION: 753860
MDC IDC MSMT LEADCHNL RA IMPEDANCE VALUE: 400 Ohm
MDC IDC MSMT LEADCHNL RA PACING THRESHOLD AMPLITUDE: 0.5 V
MDC IDC MSMT LEADCHNL RA PACING THRESHOLD PULSEWIDTH: 0.5 ms
MDC IDC MSMT LEADCHNL RA SENSING INTR AMPL: 0.9 mV
MDC IDC PG SERIAL: 7370130
MDC IDC SESS DTM: 20190116070017
MDC IDC SET LEADCHNL RV SENSING SENSITIVITY: 0.5 mV
MDC IDC STAT BRADY AP VS PERCENT: 24 %
MDC IDC STAT BRADY AS VS PERCENT: 76 %
MDC IDC STAT BRADY RA PERCENT PACED: 23 %

## 2017-03-19 DIAGNOSIS — L03032 Cellulitis of left toe: Secondary | ICD-10-CM | POA: Diagnosis not present

## 2017-03-19 DIAGNOSIS — B351 Tinea unguium: Secondary | ICD-10-CM | POA: Diagnosis not present

## 2017-03-19 DIAGNOSIS — M79672 Pain in left foot: Secondary | ICD-10-CM | POA: Diagnosis not present

## 2017-03-19 DIAGNOSIS — L600 Ingrowing nail: Secondary | ICD-10-CM | POA: Diagnosis not present

## 2017-03-23 DIAGNOSIS — J029 Acute pharyngitis, unspecified: Secondary | ICD-10-CM | POA: Diagnosis not present

## 2017-03-23 DIAGNOSIS — J069 Acute upper respiratory infection, unspecified: Secondary | ICD-10-CM | POA: Diagnosis not present

## 2017-03-23 DIAGNOSIS — Z6825 Body mass index (BMI) 25.0-25.9, adult: Secondary | ICD-10-CM | POA: Diagnosis not present

## 2017-04-02 DIAGNOSIS — M79672 Pain in left foot: Secondary | ICD-10-CM | POA: Diagnosis not present

## 2017-04-02 DIAGNOSIS — L03032 Cellulitis of left toe: Secondary | ICD-10-CM | POA: Diagnosis not present

## 2017-04-02 DIAGNOSIS — B351 Tinea unguium: Secondary | ICD-10-CM | POA: Diagnosis not present

## 2017-04-02 DIAGNOSIS — L600 Ingrowing nail: Secondary | ICD-10-CM | POA: Diagnosis not present

## 2017-04-10 ENCOUNTER — Ambulatory Visit (INDEPENDENT_AMBULATORY_CARE_PROVIDER_SITE_OTHER): Payer: Medicare Other | Admitting: *Deleted

## 2017-04-10 DIAGNOSIS — I6350 Cerebral infarction due to unspecified occlusion or stenosis of unspecified cerebral artery: Secondary | ICD-10-CM

## 2017-04-10 DIAGNOSIS — Z5181 Encounter for therapeutic drug level monitoring: Secondary | ICD-10-CM | POA: Diagnosis not present

## 2017-04-10 LAB — POCT INR: INR: 2.7

## 2017-04-10 NOTE — Patient Instructions (Signed)
Continue coumadin 1/2 tablet (2.5mg )  daily except 1 tablet (5mg ) on Sundays, Tuesdays and Thursdays   Recheck in 6 weeks

## 2017-05-12 ENCOUNTER — Other Ambulatory Visit: Payer: Self-pay | Admitting: Cardiology

## 2017-05-19 DIAGNOSIS — R7301 Impaired fasting glucose: Secondary | ICD-10-CM | POA: Diagnosis not present

## 2017-05-19 DIAGNOSIS — Z6825 Body mass index (BMI) 25.0-25.9, adult: Secondary | ICD-10-CM | POA: Diagnosis not present

## 2017-05-19 DIAGNOSIS — E876 Hypokalemia: Secondary | ICD-10-CM | POA: Diagnosis not present

## 2017-05-19 DIAGNOSIS — Z0001 Encounter for general adult medical examination with abnormal findings: Secondary | ICD-10-CM | POA: Diagnosis not present

## 2017-05-19 DIAGNOSIS — Z1211 Encounter for screening for malignant neoplasm of colon: Secondary | ICD-10-CM | POA: Diagnosis not present

## 2017-05-19 DIAGNOSIS — I5022 Chronic systolic (congestive) heart failure: Secondary | ICD-10-CM | POA: Diagnosis not present

## 2017-05-19 DIAGNOSIS — E782 Mixed hyperlipidemia: Secondary | ICD-10-CM | POA: Diagnosis not present

## 2017-05-26 ENCOUNTER — Ambulatory Visit (INDEPENDENT_AMBULATORY_CARE_PROVIDER_SITE_OTHER): Payer: Medicare Other | Admitting: *Deleted

## 2017-05-26 DIAGNOSIS — Z5181 Encounter for therapeutic drug level monitoring: Secondary | ICD-10-CM | POA: Diagnosis not present

## 2017-05-26 DIAGNOSIS — I6350 Cerebral infarction due to unspecified occlusion or stenosis of unspecified cerebral artery: Secondary | ICD-10-CM | POA: Diagnosis not present

## 2017-05-26 LAB — POCT INR: INR: 2.7

## 2017-05-26 NOTE — Patient Instructions (Signed)
Continue coumadin 1/2 tablet (2.5mg )  daily except 1 tablet (5mg ) on Sundays, Tuesdays and Thursdays   Recheck in 6 weeks

## 2017-05-27 ENCOUNTER — Ambulatory Visit (INDEPENDENT_AMBULATORY_CARE_PROVIDER_SITE_OTHER): Payer: Medicare Other | Admitting: *Deleted

## 2017-05-27 DIAGNOSIS — I420 Dilated cardiomyopathy: Secondary | ICD-10-CM | POA: Diagnosis not present

## 2017-05-27 NOTE — Progress Notes (Signed)
Remote ICD transmission.   

## 2017-05-28 ENCOUNTER — Encounter: Payer: Self-pay | Admitting: Cardiology

## 2017-06-02 LAB — CUP PACEART REMOTE DEVICE CHECK
Battery Remaining Longevity: 79 mo
Battery Voltage: 2.98 V
Brady Statistic AP VS Percent: 23 %
Brady Statistic RA Percent Paced: 23 %
Brady Statistic RV Percent Paced: 1 %
HIGH POWER IMPEDANCE MEASURED VALUE: 59 Ohm
HighPow Impedance: 59 Ohm
Implantable Lead Implant Date: 20100416
Implantable Lead Location: 753859
Implantable Pulse Generator Implant Date: 20170626
Lead Channel Impedance Value: 390 Ohm
Lead Channel Pacing Threshold Amplitude: 1 V
Lead Channel Pacing Threshold Pulse Width: 0.5 ms
Lead Channel Sensing Intrinsic Amplitude: 1.5 mV
Lead Channel Setting Pacing Amplitude: 2.5 V
Lead Channel Setting Pacing Pulse Width: 0.5 ms
Lead Channel Setting Sensing Sensitivity: 0.5 mV
MDC IDC LEAD IMPLANT DT: 20100416
MDC IDC LEAD LOCATION: 753860
MDC IDC MSMT BATTERY REMAINING PERCENTAGE: 83 %
MDC IDC MSMT LEADCHNL RA PACING THRESHOLD AMPLITUDE: 0.5 V
MDC IDC MSMT LEADCHNL RA PACING THRESHOLD PULSEWIDTH: 0.5 ms
MDC IDC MSMT LEADCHNL RV IMPEDANCE VALUE: 400 Ohm
MDC IDC MSMT LEADCHNL RV SENSING INTR AMPL: 11.7 mV
MDC IDC PG SERIAL: 7370130
MDC IDC SESS DTM: 20190417060016
MDC IDC SET LEADCHNL RA PACING AMPLITUDE: 2 V
MDC IDC STAT BRADY AP VP PERCENT: 1 %
MDC IDC STAT BRADY AS VP PERCENT: 1 %
MDC IDC STAT BRADY AS VS PERCENT: 77 %

## 2017-07-07 ENCOUNTER — Ambulatory Visit (INDEPENDENT_AMBULATORY_CARE_PROVIDER_SITE_OTHER): Payer: Medicare Other | Admitting: *Deleted

## 2017-07-07 DIAGNOSIS — Z5181 Encounter for therapeutic drug level monitoring: Secondary | ICD-10-CM

## 2017-07-07 DIAGNOSIS — I6350 Cerebral infarction due to unspecified occlusion or stenosis of unspecified cerebral artery: Secondary | ICD-10-CM | POA: Diagnosis not present

## 2017-07-07 LAB — POCT INR: INR: 3.5 — AB (ref 2.0–3.0)

## 2017-07-07 NOTE — Patient Instructions (Signed)
Hold coumadin tonight then resume 1/2 tablet (2.5mg )  daily except 1 tablet (5mg ) on Sundays, Tuesdays and Thursdays  Eat your greens  Recheck in 4 weeks

## 2017-07-12 NOTE — Progress Notes (Signed)
.    HPI The patient has a history of a nonischemic cardiomyopathy. Her ejection fraction was at one point in time 15 - 20%. A later ejection fraction was about 40% in 2015.   Most recently the EF was 25% again.   I sent her for a CPX which demonstrated a good VO2 Max.  At the last visit I made no changes to her meds.  Since I last saw her she has done well.  The patient denies any new symptoms such as chest discomfort, neck or arm discomfort. There has been no new shortness of breath, PND or orthopnea. There have been no reported palpitations, presyncope or syncope.  She travels to [place] to see her granddaughter.  She works in the yard.      No Known Allergies  Current Outpatient Medications  Medication Sig Dispense Refill  . BIOTIN PO Take 1 tablet by mouth daily.    . Coenzyme Q10 (COQ10 PO) Take 1 tablet by mouth daily.    . digoxin (DIGOX) 0.125 MG tablet Take 1 tablet (125 mcg total) by mouth daily. 90 tablet 3  . enoxaparin (LOVENOX) 120 MG/0.8ML injection Inject 0.8 mLs (120 mg total) into the skin daily. 10 Syringe 0  . furosemide (LASIX) 40 MG tablet Take 1 tablet (40 mg total) by mouth as needed. 90 tablet 3  . metoprolol succinate (TOPROL-XL) 50 MG 24 hr tablet Take 1 tablet (50 mg total) by mouth 2 (two) times daily. 180 tablet 3  . Multiple Vitamin (MULTIVITAMIN WITH MINERALS) TABS tablet Take 1 tablet by mouth daily.    . Omega-3 Fatty Acids (FISH OIL PO) Take 1 tablet by mouth daily.    . sacubitril-valsartan (ENTRESTO) 97-103 MG Take 1 tablet by mouth 2 (two) times daily. 180 tablet 3  . spironolactone (ALDACTONE) 25 MG tablet Take 0.5 tablets (12.5 mg total) by mouth daily. 90 tablet 3  . warfarin (COUMADIN) 5 MG tablet TAKE 1/2 TO 1 TABLET BY MOUTH DAILY AS DIRECTED COUMADIN CLINIC 90 tablet 0   No current facility-administered medications for this visit.     Past Medical History:  Diagnosis Date  . Arthritis    thumbs  . Breast cancer ([place])   .  Cerebrovascular disease    s/p right MCA cardioemoblic infarct 7/32/20  . CHF (congestive heart failure) ([place])   . Nonischemic cardiomyopathy (HCC)    a. +LMNA mutation b. s/p STJ dual chamber ICD    Past Surgical History:  Procedure Laterality Date  . ABDOMINAL HYSTERECTOMY    . BREAST IMPLANT REMOVAL Right 05/31/2015   Procedure: REMOVAL RIGHT  BREAST IMPLANTS;  Surgeon: Crissie Reese, MD;  Location: [facility];  Service: Plastics;  Laterality: Right;  . BREAST RECONSTRUCTION Right 1995  . BREAST RECONSTRUCTION Right 05/31/2015   Procedure: RIGHT BREAST RECONSTRUCTION WITH SALINE IMPLANTS ;  Surgeon: Crissie Reese, MD;  Location: [facility];  Service: Plastics;  Laterality: Right;  . CARDIAC DEFIBRILLATOR PLACEMENT  2010   STJ dual chamber ICD implanted for NICM by Dr Caryl Comes  . CHOLECYSTECTOMY    . COLONOSCOPY    . EP IMPLANTABLE DEVICE N/A 08/06/2015   Procedure: ICD Generator Changeout;  Surgeon: Deboraha Sprang, MD;  Location: [facility];  Service: Cardiovascular;  Laterality: N/A;  . ESOPHAGOGASTRODUODENOSCOPY    . MASTECTOMY Right   . MASTOPEXY Left 05/31/2015   Procedure: LEFT BREAST MASTOPEXY;  Surgeon: Crissie Reese, MD;  Location: [facility];  Service: Plastics;  Laterality: Left;  .  TONSILLECTOMY    . VARICOSE VEIN SURGERY Left     ROS:      As stated in the HPI and negative for all other systems.  PHYSICAL EXAM BP 95/60   Pulse 65   Ht 5\' 8"  (1.727 m)   Wt 162 lb 6.4 oz (73.7 kg)   BMI 24.69 kg/m   GENERAL:  Well appearing NECK:  No jugular venous distention, waveform within normal limits, carotid upstroke brisk and symmetric, no bruits, no thyromegaly LUNGS:  Clear to auscultation bilaterally CHEST:  Well healed ICD pocket.  HEART:  PMI not displaced or sustained,S1 and S2 within normal limits, no S3, no S4, no clicks, no rubs, no murmurs ABD:  Flat, positive bowel sounds normal in frequency in pitch, no bruits, no rebound, no guarding, no midline pulsatile mass, no  hepatomegaly, no splenomegaly EXT:  2 plus pulses throughout, no edema, no cyanosis no clubbing   EKG:   Sinus rhythm, rate 65, axis within normal limits, intervals within normal limits, no acute ST-T wave changes, interventricular conduction delay.  Compared with previous atrial pacing spikes are not visualized.    ASSESSMENT AND PLAN  CARDIOMYOPATHY, PRIMARY, DILATED - Today on top try to titrate her metoprolol to 50 mg twice daily.  Otherwise she will continue the meds as listed.  Implantable defibrillator -  She is up-to-date with remote follow-up and I will schedule an appointment with Dr. Caryl Comes.  CVA -  She had a history of CVA felt to be cardioembolic in nature. She will continue Coumadin.

## 2017-07-13 ENCOUNTER — Encounter: Payer: Self-pay | Admitting: Cardiology

## 2017-07-13 ENCOUNTER — Ambulatory Visit (INDEPENDENT_AMBULATORY_CARE_PROVIDER_SITE_OTHER): Payer: Medicare Other | Admitting: Cardiology

## 2017-07-13 VITALS — BP 95/60 | HR 65 | Ht 68.0 in | Wt 162.4 lb

## 2017-07-13 DIAGNOSIS — I6350 Cerebral infarction due to unspecified occlusion or stenosis of unspecified cerebral artery: Secondary | ICD-10-CM

## 2017-07-13 DIAGNOSIS — I420 Dilated cardiomyopathy: Secondary | ICD-10-CM

## 2017-07-13 MED ORDER — METOPROLOL SUCCINATE ER 50 MG PO TB24: 50.0000 mg | ORAL_TABLET | Freq: Two times a day (BID) | ORAL | 3 refills | Status: DC

## 2017-07-13 NOTE — Patient Instructions (Signed)
Medication Instructions:  INCREASE- Metoprolol Succinate 50 mg twice a day  If you need a refill on your cardiac medications before your next appointment, please call your pharmacy.  Labwork: None Ordered   Testing/Procedures: None Ordered  Follow-Up: Your physician wants you to follow-up in: 6 Months. You should receive a reminder letter in the mail two months in advance. If you do not receive a letter, please call our office [phone].   Thank you for choosing [facility] at [facility]!!

## 2017-08-04 ENCOUNTER — Ambulatory Visit (INDEPENDENT_AMBULATORY_CARE_PROVIDER_SITE_OTHER): Payer: Medicare Other | Admitting: Pharmacist

## 2017-08-04 DIAGNOSIS — I6350 Cerebral infarction due to unspecified occlusion or stenosis of unspecified cerebral artery: Secondary | ICD-10-CM | POA: Diagnosis not present

## 2017-08-04 DIAGNOSIS — Z5181 Encounter for therapeutic drug level monitoring: Secondary | ICD-10-CM

## 2017-08-04 LAB — POCT INR: INR: 2.9 (ref 2.0–3.0)

## 2017-08-04 NOTE — Patient Instructions (Signed)
Description   Continue 1/2 tablet (2.5mg )  daily except 1 tablet (5mg ) on Sundays, Tuesdays and Thursdays  Eat your greens  Recheck in 6 weeks

## 2017-08-11 DIAGNOSIS — Z1231 Encounter for screening mammogram for malignant neoplasm of breast: Secondary | ICD-10-CM | POA: Diagnosis not present

## 2017-08-11 DIAGNOSIS — Z9011 Acquired absence of right breast and nipple: Secondary | ICD-10-CM | POA: Diagnosis not present

## 2017-08-11 NOTE — Progress Notes (Signed)
Electrophysiology Office Note Date: 08/12/2017  ID:  [name], [name] [birth year], MRN [id number]  PCP: Caryl Bis, MD Primary Cardiologist: Hochrein Electrophysiologist: Caryl Comes  CC: Routine ICD follow-up  [name] [name] is a 67 y.o. female seen today for Dr Caryl Comes.  She presents today for routine electrophysiology followup.  Since last being seen in our clinic, the patient reports doing very well. She is looking forward to having her grandchildren visit at the end of the summer. She denies chest pain, palpitations, dyspnea, PND, orthopnea, nausea, vomiting, dizziness, syncope, edema, weight gain, or early satiety.  She has not had ICD shocks.   Device History: STJ dual chamber ICD implanted 2010 for NICM; +LMNA gene mutation; gen change 2017 History of appropriate therapy: No History of AAD therapy: No  Past Medical History:  Diagnosis Date  . Arthritis    thumbs  . Breast cancer ([place])   . Cerebrovascular disease    s/p right MCA cardioemoblic infarct 2/40/97  . CHF (congestive heart failure) ([place])   . Nonischemic cardiomyopathy ([facility])    a. +LMNA mutation b. s/p STJ dual chamber ICD   Past Surgical History:  Procedure Laterality Date  . ABDOMINAL HYSTERECTOMY    . BREAST IMPLANT REMOVAL Right 05/31/2015   Procedure: REMOVAL RIGHT  BREAST IMPLANTS;  Surgeon: Crissie Reese, MD;  Location: [facility];  Service: Plastics;  Laterality: Right;  . BREAST RECONSTRUCTION Right 1995  . BREAST RECONSTRUCTION Right 05/31/2015   Procedure: RIGHT BREAST RECONSTRUCTION WITH SALINE IMPLANTS ;  Surgeon: Crissie Reese, MD;  Location: [facility];  Service: Plastics;  Laterality: Right;  . CARDIAC DEFIBRILLATOR PLACEMENT  2010   STJ dual chamber ICD implanted for NICM by Dr Caryl Comes  . CHOLECYSTECTOMY    . COLONOSCOPY    . EP IMPLANTABLE DEVICE N/A 08/06/2015   Procedure: ICD Generator Changeout;  Surgeon: Deboraha Sprang, MD;  Location: [facility];  Service: Cardiovascular;  Laterality:  N/A;  . ESOPHAGOGASTRODUODENOSCOPY    . MASTECTOMY Right   . MASTOPEXY Left 05/31/2015   Procedure: LEFT BREAST MASTOPEXY;  Surgeon: Crissie Reese, MD;  Location: [facility];  Service: Plastics;  Laterality: Left;  . TONSILLECTOMY    . VARICOSE VEIN SURGERY Left     Current Outpatient Medications  Medication Sig Dispense Refill  . BIOTIN PO Take 1 tablet by mouth daily.    . Coenzyme Q10 (COQ10 PO) Take 1 tablet by mouth daily.    . digoxin (DIGOX) 0.125 MG tablet Take 1 tablet (125 mcg total) by mouth daily. 90 tablet 3  . furosemide (LASIX) 40 MG tablet Take 1 tablet (40 mg total) by mouth as needed. 90 tablet 3  . metoprolol succinate (TOPROL-XL) 50 MG 24 hr tablet Take 1 tablet (50 mg total) by mouth 2 (two) times daily. 180 tablet 3  . Multiple Vitamin (MULTIVITAMIN WITH MINERALS) TABS tablet Take 1 tablet by mouth daily.    . Omega-3 Fatty Acids (FISH OIL PO) Take 1 tablet by mouth daily.    . sacubitril-valsartan (ENTRESTO) 97-103 MG Take 1 tablet by mouth 2 (two) times daily. 180 tablet 3  . spironolactone (ALDACTONE) 25 MG tablet Take 0.5 tablets (12.5 mg total) by mouth daily. 90 tablet 3  . warfarin (COUMADIN) 5 MG tablet TAKE 1/2 TO 1 TABLET BY MOUTH DAILY AS DIRECTED COUMADIN CLINIC 90 tablet 0   No current facility-administered medications for this visit.     Allergies:   Patient has no known allergies.  Social History: Social History   Socioeconomic History  . Marital status: Married    Spouse name: Not on file  . Number of children: Not on file  . Years of education: Not on file  . Highest education level: Not on file  Occupational History  . Not on file  Social Needs  . Financial resource strain: Not on file  . Food insecurity:    Worry: Not on file    Inability: Not on file  . Transportation needs:    Medical: Not on file    Non-medical: Not on file  Tobacco Use  . Smoking status: Never Smoker  . Smokeless tobacco: Never Used  Substance and Sexual Activity   . Alcohol use: No  . Drug use: No  . Sexual activity: Not on file  Lifestyle  . Physical activity:    Days per week: Not on file    Minutes per session: Not on file  . Stress: Not on file  Relationships  . Social connections:    Talks on phone: Not on file    Gets together: Not on file    Attends religious service: Not on file    Active member of club or organization: Not on file    Attends meetings of clubs or organizations: Not on file    Relationship status: Not on file  . Intimate partner violence:    Fear of current or ex partner: Not on file    Emotionally abused: Not on file    Physically abused: Not on file    Forced sexual activity: Not on file  Other Topics Concern  . Not on file  Social History Narrative  . Not on file    Family History: Family History  Problem Relation Age of Onset  . Cardiomyopathy Unknown        family hx of  . Other Brother        s/p heart transplant  . Stroke Unknown        family hx of    Review of Systems: All other systems reviewed and are otherwise negative except as noted above.   Physical Exam: VS:  BP 108/68   Pulse 62   Ht 5\' 8"  (1.727 m)   Wt 165 lb 4 oz (75 kg)   BMI 25.13 kg/m  , BMI Body mass index is 25.13 kg/m.  GEN- The patient is well appearing, alert and oriented x 3 today.   HEENT: normocephalic, atraumatic; sclera clear, conjunctiva pink; hearing intact; oropharynx clear; neck supple  Lungs- Clear to ausculation bilaterally, normal work of breathing.  No wheezes, rales, rhonchi Heart- Regular rate and rhythm  GI- soft, non-tender, non-distended, bowel sounds present, no hepatosplenomegaly Extremities- no clubbing, cyanosis, or edema; DP/PT/radial pulses 2+ bilaterally MS- no significant deformity or atrophy Skin- warm and dry, no rash or lesion; ICD pocket well healed Psych- euthymic mood, full affect Neuro- strength and sensation are intact  ICD interrogation- reviewed in detail today,  See PACEART  report  EKG:  EKG is not ordered today.  Recent Labs: No results found for requested labs within last 8760 hours.   Wt Readings from Last 3 Encounters:  08/12/17 165 lb 4 oz (75 kg)  07/13/17 162 lb 6.4 oz (73.7 kg)  01/12/17 163 lb (73.9 kg)     Other studies Reviewed: Additional studies/ records that were reviewed today include: Dr Olin Pia office notes   Assessment and Plan:  1.  Chronic systolic dysfunction euvolemic today Stable  on an appropriate medical regimen Normal ICD function See Pace Art report No changes today   Current medicines are reviewed at length with the patient today.   The patient does not have concerns regarding her medicines.  The following changes were made today:  none  Labs/ tests ordered today include: none No orders of the defined types were placed in this encounter.    Disposition:   Follow up with Delilah Shan, Dr Caryl Comes 1 year    Signed, Chanetta Marshall, NP 08/12/2017 3:12 PM  [facility] [address] [place] [address] [phone] (office) [phone] (fax)

## 2017-08-12 ENCOUNTER — Encounter: Payer: Self-pay | Admitting: Nurse Practitioner

## 2017-08-12 ENCOUNTER — Ambulatory Visit (INDEPENDENT_AMBULATORY_CARE_PROVIDER_SITE_OTHER): Payer: Medicare Other | Admitting: Nurse Practitioner

## 2017-08-12 VITALS — BP 108/68 | HR 62 | Ht 68.0 in | Wt 165.2 lb

## 2017-08-12 DIAGNOSIS — I5022 Chronic systolic (congestive) heart failure: Secondary | ICD-10-CM | POA: Diagnosis not present

## 2017-08-12 DIAGNOSIS — I6350 Cerebral infarction due to unspecified occlusion or stenosis of unspecified cerebral artery: Secondary | ICD-10-CM

## 2017-08-12 NOTE — Patient Instructions (Addendum)
Medication Instructions:   Your physician recommends that you continue on your current medications as directed. Please refer to the Current Medication list given to you today.   If you need a refill on your cardiac medications before your next appointment, please call your pharmacy.  Labwork: NONE ORDERED  TODAY    Testing/Procedures: NONE ORDERED  TODAY    Follow-Up:  Your physician wants you to follow-up in: ONE YEAR WITH  KLEIN  You will receive a reminder letter in the mail two months in advance. If you don't receive a letter, please call our office to schedule the follow-up appointment.   Remote monitoring is used to monitor your Pacemaker of ICD from home. This monitoring reduces the number of office visits required to check your device to one time per year. It allows us to keep an eye on the functioning of your device to ensure it is working properly. You are scheduled for a device check from home on .  08-26-17 You may send your transmission at any time that day. If you have a wireless device, the transmission will be sent automatically. After your physician reviews your transmission, you will receive a postcard with your next transmission date.     Any Other Special Instructions Will Be Listed Below (If Applicable).                                                                                                                                                   

## 2017-08-19 LAB — CUP PACEART INCLINIC DEVICE CHECK
Date Time Interrogation Session: 20190710124205
Implantable Lead Implant Date: 20100416
Implantable Lead Location: 753860
Implantable Lead Model: 7122
MDC IDC LEAD IMPLANT DT: 20100416
MDC IDC LEAD LOCATION: 753859
MDC IDC PG IMPLANT DT: 20170626
MDC IDC PG SERIAL: 7370130

## 2017-08-25 DIAGNOSIS — L570 Actinic keratosis: Secondary | ICD-10-CM | POA: Diagnosis not present

## 2017-08-26 ENCOUNTER — Ambulatory Visit (INDEPENDENT_AMBULATORY_CARE_PROVIDER_SITE_OTHER): Payer: Medicare Other | Admitting: *Deleted

## 2017-08-26 DIAGNOSIS — I420 Dilated cardiomyopathy: Secondary | ICD-10-CM | POA: Diagnosis not present

## 2017-08-26 NOTE — Progress Notes (Signed)
Remote ICD transmission.   

## 2017-08-27 ENCOUNTER — Encounter: Payer: Self-pay | Admitting: Cardiology

## 2017-09-06 LAB — CUP PACEART REMOTE DEVICE CHECK
Battery Remaining Longevity: 77 mo
Battery Remaining Percentage: 80 %
Battery Voltage: 2.98 V
Brady Statistic AP VP Percent: 1 %
Brady Statistic AS VP Percent: 1 %
Brady Statistic RA Percent Paced: 28 %
Date Time Interrogation Session: 20190717064805
HIGH POWER IMPEDANCE MEASURED VALUE: 63 Ohm
HIGH POWER IMPEDANCE MEASURED VALUE: 63 Ohm
Implantable Lead Implant Date: 20100416
Implantable Lead Location: 753859
Implantable Lead Location: 753860
Implantable Lead Model: 7122
Implantable Pulse Generator Implant Date: 20170626
Lead Channel Impedance Value: 400 Ohm
Lead Channel Pacing Threshold Amplitude: 0.75 V
Lead Channel Pacing Threshold Amplitude: 1 V
Lead Channel Pacing Threshold Pulse Width: 0.5 ms
Lead Channel Sensing Intrinsic Amplitude: 11.7 mV
Lead Channel Setting Pacing Amplitude: 2 V
Lead Channel Setting Pacing Pulse Width: 0.5 ms
Lead Channel Setting Sensing Sensitivity: 0.5 mV
MDC IDC LEAD IMPLANT DT: 20100416
MDC IDC MSMT LEADCHNL RA IMPEDANCE VALUE: 390 Ohm
MDC IDC MSMT LEADCHNL RA SENSING INTR AMPL: 1.1 mV
MDC IDC MSMT LEADCHNL RV PACING THRESHOLD PULSEWIDTH: 0.5 ms
MDC IDC SET LEADCHNL RV PACING AMPLITUDE: 2.5 V
MDC IDC STAT BRADY AP VS PERCENT: 28 %
MDC IDC STAT BRADY AS VS PERCENT: 72 %
MDC IDC STAT BRADY RV PERCENT PACED: 1 %
Pulse Gen Serial Number: 7370130

## 2017-09-22 ENCOUNTER — Ambulatory Visit (INDEPENDENT_AMBULATORY_CARE_PROVIDER_SITE_OTHER): Payer: Medicare Other | Admitting: *Deleted

## 2017-09-22 DIAGNOSIS — I6350 Cerebral infarction due to unspecified occlusion or stenosis of unspecified cerebral artery: Secondary | ICD-10-CM

## 2017-09-22 DIAGNOSIS — Z5181 Encounter for therapeutic drug level monitoring: Secondary | ICD-10-CM

## 2017-09-22 LAB — POCT INR: INR: 2 (ref 2.0–3.0)

## 2017-09-22 NOTE — Patient Instructions (Signed)
Take coumadin 1 1/2 tablets (7.5mg ) tonight then resume 1/2 tablet (2.5mg )  daily except 1 tablet (5mg ) on Sundays, Tuesdays and Thursdays  Eat your greens  Recheck in 6 weeks

## 2017-10-31 ENCOUNTER — Other Ambulatory Visit: Payer: Self-pay | Admitting: Cardiology

## 2017-11-03 ENCOUNTER — Ambulatory Visit (INDEPENDENT_AMBULATORY_CARE_PROVIDER_SITE_OTHER): Payer: Medicare Other | Admitting: *Deleted

## 2017-11-03 DIAGNOSIS — Z5181 Encounter for therapeutic drug level monitoring: Secondary | ICD-10-CM | POA: Diagnosis not present

## 2017-11-03 DIAGNOSIS — I6350 Cerebral infarction due to unspecified occlusion or stenosis of unspecified cerebral artery: Secondary | ICD-10-CM

## 2017-11-03 LAB — POCT INR: INR: 3 (ref 2.0–3.0)

## 2017-11-03 NOTE — Patient Instructions (Signed)
Continue coumadin 1/2 tablet (2.5mg )  daily except 1 tablet (5mg ) on Sundays, Tuesdays and Thursdays  Eat your greens  Recheck in 6 weeks

## 2017-11-06 ENCOUNTER — Encounter (HOSPITAL_COMMUNITY): Payer: Self-pay | Admitting: *Deleted

## 2017-11-24 DIAGNOSIS — E876 Hypokalemia: Secondary | ICD-10-CM | POA: Diagnosis not present

## 2017-11-24 DIAGNOSIS — E782 Mixed hyperlipidemia: Secondary | ICD-10-CM | POA: Diagnosis not present

## 2017-11-24 DIAGNOSIS — Z9189 Other specified personal risk factors, not elsewhere classified: Secondary | ICD-10-CM | POA: Diagnosis not present

## 2017-11-24 DIAGNOSIS — I5022 Chronic systolic (congestive) heart failure: Secondary | ICD-10-CM | POA: Diagnosis not present

## 2017-11-24 DIAGNOSIS — Z79899 Other long term (current) drug therapy: Secondary | ICD-10-CM | POA: Diagnosis not present

## 2017-11-24 DIAGNOSIS — R7301 Impaired fasting glucose: Secondary | ICD-10-CM | POA: Diagnosis not present

## 2017-11-25 ENCOUNTER — Ambulatory Visit (INDEPENDENT_AMBULATORY_CARE_PROVIDER_SITE_OTHER): Payer: Medicare Other | Admitting: *Deleted

## 2017-11-25 DIAGNOSIS — I428 Other cardiomyopathies: Secondary | ICD-10-CM

## 2017-11-25 DIAGNOSIS — I5022 Chronic systolic (congestive) heart failure: Secondary | ICD-10-CM

## 2017-11-25 DIAGNOSIS — I429 Cardiomyopathy, unspecified: Secondary | ICD-10-CM

## 2017-11-25 NOTE — Progress Notes (Signed)
Remote ICD transmission.   

## 2017-11-26 DIAGNOSIS — H5203 Hypermetropia, bilateral: Secondary | ICD-10-CM | POA: Diagnosis not present

## 2017-11-26 DIAGNOSIS — H52222 Regular astigmatism, left eye: Secondary | ICD-10-CM | POA: Diagnosis not present

## 2017-11-26 DIAGNOSIS — H524 Presbyopia: Secondary | ICD-10-CM | POA: Diagnosis not present

## 2017-11-26 DIAGNOSIS — H2510 Age-related nuclear cataract, unspecified eye: Secondary | ICD-10-CM | POA: Diagnosis not present

## 2017-11-27 ENCOUNTER — Encounter: Payer: Self-pay | Admitting: Cardiology

## 2017-11-27 DIAGNOSIS — Z6824 Body mass index (BMI) 24.0-24.9, adult: Secondary | ICD-10-CM | POA: Diagnosis not present

## 2017-11-27 DIAGNOSIS — R7301 Impaired fasting glucose: Secondary | ICD-10-CM | POA: Diagnosis not present

## 2017-11-27 DIAGNOSIS — E876 Hypokalemia: Secondary | ICD-10-CM | POA: Diagnosis not present

## 2017-11-27 DIAGNOSIS — Z23 Encounter for immunization: Secondary | ICD-10-CM | POA: Diagnosis not present

## 2017-11-27 DIAGNOSIS — I5022 Chronic systolic (congestive) heart failure: Secondary | ICD-10-CM | POA: Diagnosis not present

## 2017-11-27 DIAGNOSIS — E782 Mixed hyperlipidemia: Secondary | ICD-10-CM | POA: Diagnosis not present

## 2017-12-01 ENCOUNTER — Other Ambulatory Visit: Payer: Self-pay | Admitting: Cardiology

## 2017-12-17 ENCOUNTER — Ambulatory Visit (INDEPENDENT_AMBULATORY_CARE_PROVIDER_SITE_OTHER): Payer: Medicare Other | Admitting: *Deleted

## 2017-12-17 DIAGNOSIS — Z5181 Encounter for therapeutic drug level monitoring: Secondary | ICD-10-CM | POA: Diagnosis not present

## 2017-12-17 DIAGNOSIS — I6350 Cerebral infarction due to unspecified occlusion or stenosis of unspecified cerebral artery: Secondary | ICD-10-CM

## 2017-12-17 LAB — POCT INR: INR: 2.9 (ref 2.0–3.0)

## 2018-01-17 NOTE — Progress Notes (Signed)
.    HPI The patient has a history of a nonischemic cardiomyopathy. Her ejection fraction was at one point in time 15 - 20%. A later ejection fraction was about 40% in 2015.   Most recently the EF was 25% again.   I sent her for a CPX which demonstrated a good VO2 Max.  She is done well since I last saw her.  She continues to be active. The patient denies any new symptoms such as chest discomfort, neck or arm discomfort. There has been no new shortness of breath, PND or orthopnea. There have been no reported palpitations, presyncope or syncope.   No Known Allergies  Current Outpatient Medications  Medication Sig Dispense Refill  . BIOTIN PO Take 1 tablet by mouth daily.    . Coenzyme Q10 (COQ10 PO) Take 1 tablet by mouth daily.    . digoxin (LANOXIN) 0.125 MG tablet TAKE ONE TABLET BY MOUTH DAILY. 90 tablet 3  . furosemide (LASIX) 40 MG tablet TAKE ONE TABLET BY MOUTH AS NEEDED. 90 tablet 3  . metoprolol succinate (TOPROL-XL) 50 MG 24 hr tablet Take 1 tablet (50 mg total) by mouth 2 (two) times daily. 180 tablet 3  . Multiple Vitamin (MULTIVITAMIN WITH MINERALS) TABS tablet Take 1 tablet by mouth daily.    . Omega-3 Fatty Acids (FISH OIL PO) Take 1 tablet by mouth daily.    . sacubitril-valsartan (ENTRESTO) 97-103 MG Take 1 tablet by mouth 2 (two) times daily. 180 tablet 3  . spironolactone (ALDACTONE) 25 MG tablet Take 0.5 tablets (12.5 mg total) by mouth daily. 90 tablet 3  . warfarin (COUMADIN) 5 MG tablet TAKE 1/2 TO 1 TABLET BY MOUTH DAILY AS DIRECTED COUMADIN CLINIC 90 tablet 0   No current facility-administered medications for this visit.     Past Medical History:  Diagnosis Date  . Arthritis    thumbs  . Breast cancer ([facility])   . Cerebrovascular disease    s/p right MCA cardioemoblic infarct 6/60/63  . CHF (congestive heart failure) ([facility])   . Nonischemic cardiomyopathy ([facility])    a. +LMNA mutation b. s/p STJ dual chamber ICD    Past Surgical History:  Procedure  Laterality Date  . ABDOMINAL HYSTERECTOMY    . BREAST IMPLANT REMOVAL Right 05/31/2015   Procedure: REMOVAL RIGHT  BREAST IMPLANTS;  Surgeon: Crissie Reese, MD;  Location: [facility];  Service: Plastics;  Laterality: Right;  . BREAST RECONSTRUCTION Right 1995  . BREAST RECONSTRUCTION Right 05/31/2015   Procedure: RIGHT BREAST RECONSTRUCTION WITH SALINE IMPLANTS ;  Surgeon: Crissie Reese, MD;  Location: [facility];  Service: Plastics;  Laterality: Right;  . CARDIAC DEFIBRILLATOR PLACEMENT  2010   STJ dual chamber ICD implanted for NICM by Dr Caryl Comes  . CHOLECYSTECTOMY    . COLONOSCOPY    . EP IMPLANTABLE DEVICE N/A 08/06/2015   Procedure: ICD Generator Changeout;  Surgeon: Deboraha Sprang, MD;  Location: [facility];  Service: Cardiovascular;  Laterality: N/A;  . ESOPHAGOGASTRODUODENOSCOPY    . MASTECTOMY Right   . MASTOPEXY Left 05/31/2015   Procedure: LEFT BREAST MASTOPEXY;  Surgeon: Crissie Reese, MD;  Location: [facility];  Service: Plastics;  Laterality: Left;  . TONSILLECTOMY    . VARICOSE VEIN SURGERY Left     ROS:      As stated in the HPI and negative for all other systems.  PHYSICAL EXAM BP 126/64   Pulse 64   Ht 5\' 8"  (1.727 m)   Wt 162  lb (73.5 kg)   BMI 24.63 kg/m   GENERAL:  Well appearing NECK:  No jugular venous distention, waveform within normal limits, carotid upstroke brisk and symmetric, no bruits, no thyromegaly LUNGS:  Clear to auscultation bilaterally CHEST: Well-healed device pocket HEART:  PMI not displaced or sustained,S1 and S2 within normal limits, no S3, no S4, no clicks, no rubs, no murmurs ABD:  Flat, positive bowel sounds normal in frequency in pitch, no bruits, no rebound, no guarding, no midline pulsatile mass, no hepatomegaly, no splenomegaly EXT:  2 plus pulses throughout, no edema, no cyanosis no clubbing   EKG:   NA    ASSESSMENT AND PLAN  CARDIOMYOPATHY, PRIMARY, DILATED - She is doing extremely well.  Her ejection fraction had improved at the last  echocardiogram.  She has class I symptoms.  No change in therapy.   Implantable defibrillator -  She is up-to-date with remote follow-up although I cannot see the most recent report.  CVA -  Given the fact that she had an embolic source presumed with this about 9 years ago and she is had no problems I would continue the Coumadin.

## 2018-01-20 DIAGNOSIS — H02834 Dermatochalasis of left upper eyelid: Secondary | ICD-10-CM | POA: Diagnosis not present

## 2018-01-20 DIAGNOSIS — H02422 Myogenic ptosis of left eyelid: Secondary | ICD-10-CM | POA: Diagnosis not present

## 2018-01-20 DIAGNOSIS — H02831 Dermatochalasis of right upper eyelid: Secondary | ICD-10-CM | POA: Diagnosis not present

## 2018-01-21 ENCOUNTER — Encounter: Payer: Self-pay | Admitting: Cardiology

## 2018-01-21 ENCOUNTER — Ambulatory Visit (INDEPENDENT_AMBULATORY_CARE_PROVIDER_SITE_OTHER): Payer: Medicare Other | Admitting: Cardiology

## 2018-01-21 VITALS — BP 126/64 | HR 64 | Ht 68.0 in | Wt 162.0 lb

## 2018-01-21 DIAGNOSIS — I6350 Cerebral infarction due to unspecified occlusion or stenosis of unspecified cerebral artery: Secondary | ICD-10-CM | POA: Diagnosis not present

## 2018-01-21 DIAGNOSIS — I420 Dilated cardiomyopathy: Secondary | ICD-10-CM

## 2018-01-21 NOTE — Patient Instructions (Signed)
Medication Instructions:  Continue current medications  If you need a refill on your cardiac medications before your next appointment, please call your pharmacy.  Labwork: None ordered   If you have labs (blood work) drawn today and your tests are completely normal, you will receive your results only by [employer] (if you have MyChart) -OR- A paper copy in the mail.  If you have any lab test that is abnormal or we need to change your treatment, we will call you to review these results.  Testing/Procedures: None ordered  Follow-Up: You will need a follow up appointment in 6 months.  Please call our office 2 months in advance to schedule this appointment.  You may see Dr Percival Spanish or one of the following Advanced Practice Providers on your designated Care Team:   Rosaria Ferries, PA-C . Jory Sims, DNP, ANP    At [facility], you and your health needs are our priority.  As part of our continuing mission to provide you with exceptional heart care, we have created designated Provider Care Teams.  These Care Teams include your primary Cardiologist (physician) and Advanced Practice Providers (APPs -  Physician Assistants and Nurse Practitioners) who all work together to provide you with the care you need, when you need it.

## 2018-01-28 ENCOUNTER — Ambulatory Visit (INDEPENDENT_AMBULATORY_CARE_PROVIDER_SITE_OTHER): Payer: Medicare Other | Admitting: *Deleted

## 2018-01-28 DIAGNOSIS — Z5181 Encounter for therapeutic drug level monitoring: Secondary | ICD-10-CM

## 2018-01-28 DIAGNOSIS — I6350 Cerebral infarction due to unspecified occlusion or stenosis of unspecified cerebral artery: Secondary | ICD-10-CM | POA: Diagnosis not present

## 2018-01-28 LAB — POCT INR: INR: 2.4 (ref 2.0–3.0)

## 2018-01-28 NOTE — Patient Instructions (Signed)
Continue coumadin 1/2 tablet daily except 1 tablet on Sundays, Tuesdays and Thursdays Recheck in 6 weeks  

## 2018-01-30 LAB — CUP PACEART REMOTE DEVICE CHECK
Battery Remaining Percentage: 79 %
Battery Voltage: 2.98 V
Brady Statistic AP VP Percent: 1 %
Brady Statistic AP VS Percent: 24 %
HIGH POWER IMPEDANCE MEASURED VALUE: 70 Ohm
HighPow Impedance: 70 Ohm
Implantable Lead Implant Date: 20100416
Implantable Lead Location: 753859
Implantable Lead Model: 7122
Lead Channel Impedance Value: 430 Ohm
Lead Channel Pacing Threshold Amplitude: 1 V
Lead Channel Pacing Threshold Pulse Width: 0.5 ms
Lead Channel Sensing Intrinsic Amplitude: 11.7 mV
Lead Channel Setting Pacing Amplitude: 2 V
Lead Channel Setting Pacing Amplitude: 2.5 V
Lead Channel Setting Pacing Pulse Width: 0.5 ms
MDC IDC LEAD IMPLANT DT: 20100416
MDC IDC LEAD LOCATION: 753860
MDC IDC MSMT BATTERY REMAINING LONGEVITY: 76 mo
MDC IDC MSMT LEADCHNL RA IMPEDANCE VALUE: 400 Ohm
MDC IDC MSMT LEADCHNL RA PACING THRESHOLD AMPLITUDE: 0.75 V
MDC IDC MSMT LEADCHNL RA PACING THRESHOLD PULSEWIDTH: 0.5 ms
MDC IDC MSMT LEADCHNL RA SENSING INTR AMPL: 1.3 mV
MDC IDC PG IMPLANT DT: 20170626
MDC IDC PG SERIAL: 7370130
MDC IDC SESS DTM: 20191016060015
MDC IDC SET LEADCHNL RV SENSING SENSITIVITY: 0.5 mV
MDC IDC STAT BRADY AS VP PERCENT: 1 %
MDC IDC STAT BRADY AS VS PERCENT: 76 %
MDC IDC STAT BRADY RA PERCENT PACED: 24 %
MDC IDC STAT BRADY RV PERCENT PACED: 1 %

## 2018-02-09 DIAGNOSIS — H25813 Combined forms of age-related cataract, bilateral: Secondary | ICD-10-CM | POA: Diagnosis not present

## 2018-02-09 DIAGNOSIS — H1712 Central corneal opacity, left eye: Secondary | ICD-10-CM | POA: Diagnosis not present

## 2018-02-09 DIAGNOSIS — H40013 Open angle with borderline findings, low risk, bilateral: Secondary | ICD-10-CM | POA: Diagnosis not present

## 2018-02-11 ENCOUNTER — Other Ambulatory Visit: Payer: Self-pay | Admitting: Cardiology

## 2018-02-15 NOTE — Patient Instructions (Signed)
Your procedure is scheduled on: 02/26/2018  Report to [facility] at  745   AM.  Call this number if you have problems the morning of surgery: [phone]   Do not eat food or drink liquids :After Midnight.      Take these medicines the morning of surgery with A SIP OF WATER: digoxin, entresto.   Do not wear jewelry, make-up or nail polish.  Do not wear lotions, powders, or perfumes. You may wear deodorant.  Do not shave 48 hours prior to surgery.  Do not bring valuables to the hospital.  Contacts, dentures or bridgework may not be worn into surgery.  Leave suitcase in the car. After surgery it may be brought to your room.  For patients admitted to the hospital, checkout time is 11:00 AM the day of discharge.   Patients discharged the day of surgery will not be allowed to drive home.  :     Please read over the following fact sheets that you were given: Coughing and Deep Breathing, Surgical Site Infection Prevention, Anesthesia Post-op Instructions and Care and Recovery After Surgery    Cataract A cataract is a clouding of the lens of the eye. When a lens becomes cloudy, vision is reduced based on the degree and nature of the clouding. Many cataracts reduce vision to some degree. Some cataracts make people more near-sighted as they develop. Other cataracts increase glare. Cataracts that are ignored and become worse can sometimes look white. The white color can be seen through the pupil. CAUSES   Aging. However, cataracts may occur at any age, even in newborns.   Certain drugs.   Trauma to the eye.   Certain diseases such as diabetes.   Specific eye diseases such as chronic inflammation inside the eye or a sudden attack of a rare form of glaucoma.   Inherited or acquired medical problems.  SYMPTOMS   Gradual, progressive drop in vision in the affected eye.   Severe, rapid visual loss. This most often happens when trauma is the cause.  DIAGNOSIS  To detect a cataract, an eye  doctor examines the lens. Cataracts are best diagnosed with an exam of the eyes with the pupils enlarged (dilated) by drops.  TREATMENT  For an early cataract, vision may improve by using different eyeglasses or stronger lighting. If that does not help your vision, surgery is the only effective treatment. A cataract needs to be surgically removed when vision loss interferes with your everyday activities, such as driving, reading, or watching TV. A cataract may also have to be removed if it prevents examination or treatment of another eye problem. Surgery removes the cloudy lens and usually replaces it with a substitute lens (intraocular lens, IOL).  At a time when both you and your doctor agree, the cataract will be surgically removed. If you have cataracts in both eyes, only one is usually removed at a time. This allows the operated eye to heal and be out of danger from any possible problems after surgery (such as infection or poor wound healing). In rare cases, a cataract may be doing damage to your eye. In these cases, your caregiver may advise surgical removal right away. The vast majority of people who have cataract surgery have better vision afterward. HOME CARE INSTRUCTIONS  If you are not planning surgery, you may be asked to do the following:  Use different eyeglasses.   Use stronger or brighter lighting.   Ask your eye doctor about reducing your medicine  dose or changing medicines if it is thought that a medicine caused your cataract. Changing medicines does not make the cataract go away on its own.   Become familiar with your surroundings. Poor vision can lead to injury. Avoid bumping into things on the affected side. You are at a higher risk for tripping or falling.   Exercise extreme care when driving or operating machinery.   Wear sunglasses if you are sensitive to bright light or experiencing problems with glare.  SEEK IMMEDIATE MEDICAL CARE IF:   You have a worsening or sudden  vision loss.   You notice redness, swelling, or increasing pain in the eye.   You have a fever.  Document Released: 01/27/2005 Document Revised: 01/16/2011 Document Reviewed: 09/20/2010 [facility] Patient Information 2012 [place].PATIENT INSTRUCTIONS POST-ANESTHESIA  IMMEDIATELY FOLLOWING SURGERY:  Do not drive or operate machinery for the first twenty four hours after surgery.  Do not make any important decisions for twenty four hours after surgery or while taking narcotic pain medications or sedatives.  If you develop intractable nausea and vomiting or a severe headache please notify your doctor immediately.  FOLLOW-UP:  Please make an appointment with your surgeon as instructed. You do not need to follow up with anesthesia unless specifically instructed to do so.  WOUND CARE INSTRUCTIONS (if applicable):  Keep a dry clean dressing on the anesthesia/puncture wound site if there is drainage.  Once the wound has quit draining you may leave it open to air.  Generally you should leave the bandage intact for twenty four hours unless there is drainage.  If the epidural site drains for more than 36-48 hours please call the anesthesia department.  QUESTIONS?:  Please feel free to call your physician or the hospital operator if you have any questions, and they will be happy to assist you.

## 2018-02-19 ENCOUNTER — Other Ambulatory Visit (HOSPITAL_COMMUNITY): Payer: Medicare Other

## 2018-02-22 ENCOUNTER — Other Ambulatory Visit: Payer: Self-pay

## 2018-02-22 ENCOUNTER — Encounter (HOSPITAL_COMMUNITY): Payer: Self-pay

## 2018-02-22 ENCOUNTER — Encounter (HOSPITAL_COMMUNITY)
Admission: RE | Admit: 2018-02-22 | Discharge: 2018-02-22 | Disposition: A | Discharge status: Home / Self Care | Payer: Medicare Other | Source: Ambulatory Visit | Attending: Ophthalmology | Admitting: Ophthalmology

## 2018-02-22 DIAGNOSIS — Z01812 Encounter for preprocedural laboratory examination: Secondary | ICD-10-CM | POA: Diagnosis not present

## 2018-02-22 LAB — CBC WITH DIFFERENTIAL/PLATELET
Abs Immature Granulocytes: 0.01 10*3/uL (ref 0.00–0.07)
Basophils Absolute: 0 10*3/uL (ref 0.0–0.1)
Basophils Relative: 1 %
Eosinophils Absolute: 0.1 10*3/uL (ref 0.0–0.5)
Eosinophils Relative: 2 %
HEMATOCRIT: 39.1 % (ref 36.0–46.0)
HEMOGLOBIN: 12.4 g/dL (ref 12.0–15.0)
Immature Granulocytes: 0 %
Lymphocytes Relative: 20 %
Lymphs Abs: 1.3 10*3/uL (ref 0.7–4.0)
MCH: 29 pg (ref 26.0–34.0)
MCHC: 31.7 g/dL (ref 30.0–36.0)
MCV: 91.6 fL (ref 80.0–100.0)
MONOS PCT: 8 %
Monocytes Absolute: 0.5 10*3/uL (ref 0.1–1.0)
Neutro Abs: 4.2 10*3/uL (ref 1.7–7.7)
Neutrophils Relative %: 69 %
Platelets: 168 10*3/uL (ref 150–400)
RBC: 4.27 MIL/uL (ref 3.87–5.11)
RDW: 14.6 % (ref 11.5–15.5)
WBC: 6.1 10*3/uL (ref 4.0–10.5)
nRBC: 0 % (ref 0.0–0.2)

## 2018-02-22 LAB — BASIC METABOLIC PANEL
Anion gap: 9 (ref 5–15)
BUN: 17 mg/dL (ref 8–23)
CO2: 26 mmol/L (ref 22–32)
Calcium: 9 mg/dL (ref 8.9–10.3)
Chloride: 104 mmol/L (ref 98–111)
Creatinine, Ser: 0.78 mg/dL (ref 0.44–1.00)
GFR calc Af Amer: >60 mL/min (ref >60)
GFR calc non Af Amer: >60 mL/min (ref >60)
GLUCOSE: 120 mg/dL — AB (ref 70–99)
Potassium: 3.7 mmol/L (ref 3.5–5.1)
Sodium: 139 mmol/L (ref 135–145)

## 2018-02-24 ENCOUNTER — Ambulatory Visit (INDEPENDENT_AMBULATORY_CARE_PROVIDER_SITE_OTHER): Payer: Medicare Other

## 2018-02-24 DIAGNOSIS — I428 Other cardiomyopathies: Secondary | ICD-10-CM | POA: Diagnosis not present

## 2018-02-24 DIAGNOSIS — I5022 Chronic systolic (congestive) heart failure: Secondary | ICD-10-CM

## 2018-02-24 DIAGNOSIS — I429 Cardiomyopathy, unspecified: Secondary | ICD-10-CM

## 2018-02-25 NOTE — Progress Notes (Signed)
Remote ICD transmission.   

## 2018-02-28 LAB — CUP PACEART REMOTE DEVICE CHECK
Battery Remaining Longevity: 74 mo
Battery Remaining Percentage: 76 %
Battery Voltage: 2.96 V
Brady Statistic AP VP Percent: 1 %
Brady Statistic AP VS Percent: 24 %
Brady Statistic AS VP Percent: 1 %
Brady Statistic AS VS Percent: 76 %
Brady Statistic RA Percent Paced: 24 %
Date Time Interrogation Session: 20200115070017
HighPow Impedance: 73 Ohm
HighPow Impedance: 73 Ohm
Implantable Lead Implant Date: 20100416
Implantable Lead Implant Date: 20100416
Implantable Lead Location: 753859
Implantable Lead Location: 753860
Implantable Lead Model: 7122
Implantable Pulse Generator Implant Date: 20170626
Lead Channel Impedance Value: 430 Ohm
Lead Channel Impedance Value: 440 Ohm
Lead Channel Pacing Threshold Amplitude: 0.75 V
Lead Channel Pacing Threshold Amplitude: 1 V
Lead Channel Pacing Threshold Pulse Width: 0.5 ms
Lead Channel Sensing Intrinsic Amplitude: 1.1 mV
Lead Channel Sensing Intrinsic Amplitude: 11.7 mV
Lead Channel Setting Pacing Amplitude: 2 V
Lead Channel Setting Pacing Amplitude: 2.5 V
Lead Channel Setting Pacing Pulse Width: 0.5 ms
Lead Channel Setting Sensing Sensitivity: 0.5 mV
MDC IDC MSMT LEADCHNL RV PACING THRESHOLD PULSEWIDTH: 0.5 ms
MDC IDC STAT BRADY RV PERCENT PACED: 1 %
Pulse Gen Serial Number: 7370130

## 2018-03-01 DIAGNOSIS — R300 Dysuria: Secondary | ICD-10-CM | POA: Diagnosis not present

## 2018-03-01 DIAGNOSIS — N3090 Cystitis, unspecified without hematuria: Secondary | ICD-10-CM | POA: Diagnosis not present

## 2018-03-01 DIAGNOSIS — R309 Painful micturition, unspecified: Secondary | ICD-10-CM | POA: Diagnosis not present

## 2018-03-01 DIAGNOSIS — Z6825 Body mass index (BMI) 25.0-25.9, adult: Secondary | ICD-10-CM | POA: Diagnosis not present

## 2018-03-11 ENCOUNTER — Ambulatory Visit (INDEPENDENT_AMBULATORY_CARE_PROVIDER_SITE_OTHER): Payer: Medicare Other | Admitting: Pharmacist

## 2018-03-11 DIAGNOSIS — I6350 Cerebral infarction due to unspecified occlusion or stenosis of unspecified cerebral artery: Secondary | ICD-10-CM | POA: Diagnosis not present

## 2018-03-11 DIAGNOSIS — Z5181 Encounter for therapeutic drug level monitoring: Secondary | ICD-10-CM | POA: Diagnosis not present

## 2018-03-11 LAB — POCT INR: INR: 3 (ref 2.0–3.0)

## 2018-03-11 NOTE — Patient Instructions (Signed)
Description   Continue coumadin 1/2 tablet daily except 1 tablet on Sundays, Tuesdays and Thursdays Recheck in 6 weeks

## 2018-03-12 ENCOUNTER — Encounter (HOSPITAL_COMMUNITY)
Admission: RE | Admit: 2018-03-12 | Discharge: 2018-03-12 | Disposition: A | Discharge status: Home / Self Care | Payer: Medicare Other | Source: Ambulatory Visit | Attending: Ophthalmology | Admitting: Ophthalmology

## 2018-03-12 DIAGNOSIS — H25811 Combined forms of age-related cataract, right eye: Secondary | ICD-10-CM | POA: Diagnosis not present

## 2018-03-15 ENCOUNTER — Other Ambulatory Visit (HOSPITAL_COMMUNITY): Payer: Medicare Other

## 2018-03-18 ENCOUNTER — Other Ambulatory Visit: Payer: Self-pay | Admitting: Cardiology

## 2018-03-19 ENCOUNTER — Ambulatory Visit (HOSPITAL_COMMUNITY)
Admission: RE | Admit: 2018-03-19 | Discharge: 2018-03-19 | Disposition: A | Discharge status: Home / Self Care | Payer: Medicare Other | Attending: Ophthalmology | Admitting: Ophthalmology

## 2018-03-19 ENCOUNTER — Encounter (HOSPITAL_COMMUNITY)
Admission: RE | Disposition: A | Discharge status: Home / Self Care | Payer: Self-pay | Source: Home / Self Care | Attending: Ophthalmology

## 2018-03-19 ENCOUNTER — Ambulatory Visit (HOSPITAL_COMMUNITY): Payer: Medicare Other | Admitting: Anesthesiology

## 2018-03-19 ENCOUNTER — Encounter (HOSPITAL_COMMUNITY): Payer: Self-pay | Admitting: *Deleted

## 2018-03-19 DIAGNOSIS — Z853 Personal history of malignant neoplasm of breast: Secondary | ICD-10-CM | POA: Diagnosis not present

## 2018-03-19 DIAGNOSIS — Z8673 Personal history of transient ischemic attack (TIA), and cerebral infarction without residual deficits: Secondary | ICD-10-CM | POA: Insufficient documentation

## 2018-03-19 DIAGNOSIS — Z79899 Other long term (current) drug therapy: Secondary | ICD-10-CM | POA: Diagnosis not present

## 2018-03-19 DIAGNOSIS — I509 Heart failure, unspecified: Secondary | ICD-10-CM | POA: Diagnosis not present

## 2018-03-19 DIAGNOSIS — H259 Unspecified age-related cataract: Secondary | ICD-10-CM | POA: Insufficient documentation

## 2018-03-19 DIAGNOSIS — I428 Other cardiomyopathies: Secondary | ICD-10-CM | POA: Diagnosis not present

## 2018-03-19 DIAGNOSIS — E119 Type 2 diabetes mellitus without complications: Secondary | ICD-10-CM | POA: Insufficient documentation

## 2018-03-19 DIAGNOSIS — I429 Cardiomyopathy, unspecified: Secondary | ICD-10-CM | POA: Diagnosis not present

## 2018-03-19 DIAGNOSIS — H25811 Combined forms of age-related cataract, right eye: Secondary | ICD-10-CM | POA: Diagnosis not present

## 2018-03-19 DIAGNOSIS — I639 Cerebral infarction, unspecified: Secondary | ICD-10-CM | POA: Diagnosis not present

## 2018-03-19 HISTORY — PX: CATARACT EXTRACTION W/PHACO: SHX586

## 2018-03-19 SURGERY — PHACOEMULSIFICATION, CATARACT, WITH IOL INSERTION
Anesthesia: Monitor Anesthesia Care | Site: Eye | Laterality: Right

## 2018-03-19 MED ORDER — BSS IO SOLN
INTRAOCULAR | Status: DC | PRN
  Administered 2018-03-19: 15 mL

## 2018-03-19 MED ORDER — LIDOCAINE HCL 3.5 % OP GEL
1.0000 "application " | Freq: Once | OPHTHALMIC | Status: AC
  Administered 2018-03-19: 1 via OPHTHALMIC

## 2018-03-19 MED ORDER — NEOMYCIN-POLYMYXIN-DEXAMETH 3.5-10000-0.1 OP SUSP
OPHTHALMIC | Status: DC | PRN
  Administered 2018-03-19: 2 [drp] via OPHTHALMIC

## 2018-03-19 MED ORDER — HYDROCODONE-ACETAMINOPHEN 7.5-325 MG PO TABS: 1.0000 | ORAL_TABLET | Freq: Once | ORAL | Status: DC | PRN

## 2018-03-19 MED ORDER — CYCLOPENTOLATE-PHENYLEPHRINE 0.2-1 % OP SOLN
1.0000 [drp] | OPHTHALMIC | Status: AC
  Administered 2018-03-19 (×3): 1 [drp] via OPHTHALMIC

## 2018-03-19 MED ORDER — SODIUM HYALURONATE 23 MG/ML IO SOLN
INTRAOCULAR | Status: DC | PRN
  Administered 2018-03-19: 0.6 mL via INTRAOCULAR

## 2018-03-19 MED ORDER — MEPERIDINE HCL 50 MG/ML IJ SOLN: 6.2500 mg | INTRAMUSCULAR | Status: DC | PRN

## 2018-03-19 MED ORDER — MIDAZOLAM HCL 2 MG/2ML IJ SOLN
INTRAMUSCULAR | Status: AC
  Filled 2018-03-19: qty 2

## 2018-03-19 MED ORDER — HYDROMORPHONE HCL 1 MG/ML IJ SOLN: 0.2500 mg | INTRAMUSCULAR | Status: DC | PRN

## 2018-03-19 MED ORDER — LACTATED RINGERS IV SOLN: INTRAVENOUS | Status: DC

## 2018-03-19 MED ORDER — EPINEPHRINE PF 1 MG/ML IJ SOLN
INTRAOCULAR | Status: DC | PRN
  Administered 2018-03-19: 500 mL

## 2018-03-19 MED ORDER — MIDAZOLAM HCL 2 MG/2ML IJ SOLN
INTRAMUSCULAR | Status: DC | PRN
  Administered 2018-03-19 (×2): 1 mg via INTRAVENOUS

## 2018-03-19 MED ORDER — PROMETHAZINE HCL 25 MG/ML IJ SOLN: 6.2500 mg | INTRAMUSCULAR | Status: DC | PRN

## 2018-03-19 MED ORDER — PHENYLEPHRINE HCL 2.5 % OP SOLN
1.0000 [drp] | OPHTHALMIC | Status: AC
  Administered 2018-03-19 (×3): 1 [drp] via OPHTHALMIC

## 2018-03-19 MED ORDER — SODIUM CHLORIDE 0.9% FLUSH
10.0000 mL | INTRAVENOUS | Status: DC | PRN
  Administered 2018-03-19: 10 mL via INTRAVENOUS

## 2018-03-19 MED ORDER — POVIDONE-IODINE 5 % OP SOLN
OPHTHALMIC | Status: DC | PRN
  Administered 2018-03-19: 1 via OPHTHALMIC

## 2018-03-19 MED ORDER — TETRACAINE HCL 0.5 % OP SOLN
1.0000 [drp] | OPHTHALMIC | Status: AC
  Administered 2018-03-19 (×3): 1 [drp] via OPHTHALMIC

## 2018-03-19 MED ORDER — PROVISC 10 MG/ML IO SOLN
INTRAOCULAR | Status: DC | PRN
  Administered 2018-03-19: 0.85 mL via INTRAOCULAR

## 2018-03-19 MED ORDER — LIDOCAINE HCL (PF) 1 % IJ SOLN
INTRAOCULAR | Status: DC | PRN
  Administered 2018-03-19: 1 mL via OPHTHALMIC

## 2018-03-19 SURGICAL SUPPLY — 13 items
CLOTH BEACON ORANGE TIMEOUT ST (SAFETY) ×2 IMPLANT
EYE SHIELD UNIVERSAL CLEAR (GAUZE/BANDAGES/DRESSINGS) ×2 IMPLANT
GLOVE BIOGEL PI IND STRL 7.0 (GLOVE) IMPLANT
GLOVE BIOGEL PI INDICATOR 7.0 (GLOVE) ×2
LENS ALC ACRYL/TECN (Ophthalmic Related) ×2 IMPLANT
NDL HYPO 18GX1.5 BLUNT FILL (NEEDLE) IMPLANT
NEEDLE HYPO 18GX1.5 BLUNT FILL (NEEDLE) ×3 IMPLANT
PAD ARMBOARD 7.5X6 YLW CONV (MISCELLANEOUS) ×2 IMPLANT
SYR TB 1ML LL NO SAFETY (SYRINGE) ×2 IMPLANT
TAPE SURG TRANSPORE 1 IN (GAUZE/BANDAGES/DRESSINGS) IMPLANT
TAPE SURGICAL TRANSPORE 1 IN (GAUZE/BANDAGES/DRESSINGS) ×2
VISCOELASTIC ADDITIONAL (OPHTHALMIC RELATED) ×2 IMPLANT
WATER STERILE IRR 250ML POUR (IV SOLUTION) ×2 IMPLANT

## 2018-03-19 NOTE — Anesthesia Procedure Notes (Signed)
Procedure Name: MAC Date/Time: 03/19/2018 7:55 AM Performed by: Vista Deck, CRNA Pre-anesthesia Checklist: Patient identified, Emergency Drugs available, Suction available, Timeout performed and Patient being monitored Patient Re-evaluated:Patient Re-evaluated prior to induction Oxygen Delivery Method: Nasal Cannula

## 2018-03-19 NOTE — H&P (Signed)
The H and P was reviewed and updated. The patient was examined.  No changes were found after exam.  The surgical eye was marked.  

## 2018-03-19 NOTE — Anesthesia Postprocedure Evaluation (Signed)
Anesthesia Post Note  Patient: [name] [name]  Procedure(s) Performed: CATARACT EXTRACTION PHACO AND INTRAOCULAR LENS PLACEMENT (IOC) (Right Eye)  Patient location during evaluation: Short Stay Anesthesia Type: MAC Level of consciousness: awake and alert and patient cooperative Pain management: satisfactory to patient Vital Signs Assessment: post-procedure vital signs reviewed and stable Respiratory status: spontaneous breathing Cardiovascular status: stable Postop Assessment: no apparent nausea or vomiting Anesthetic complications: no     Last Vitals:  Vitals:   03/19/18 0702  BP: 108/65  Resp: 15  Temp: 36.9 C  SpO2: 100%    Last Pain:  Vitals:   03/19/18 0702  TempSrc: Oral  PainSc: 0-No pain                 Adarsh Mundorf

## 2018-03-19 NOTE — Anesthesia Preprocedure Evaluation (Signed)
Anesthesia Evaluation    Airway Mallampati: II       Dental  (+) Teeth Intact   Pulmonary    breath sounds clear to auscultation       Cardiovascular +CHF   Rhythm:regular     Neuro/Psych CVA    GI/Hepatic   Endo/Other    Renal/GU      Musculoskeletal   Abdominal   Peds  Hematology   Anesthesia Other Findings Non-ischemic cardiomyopathy  Reproductive/Obstetrics                             Anesthesia Physical Anesthesia Plan  ASA: III  Anesthesia Plan: MAC   Post-op Pain Management:    Induction:   PONV Risk Score and Plan:   Airway Management Planned:   Additional Equipment:   Intra-op Plan:   Post-operative Plan:   Informed Consent: I have reviewed the patients History and Physical, chart, labs and discussed the procedure including the risks, benefits and alternatives for the proposed anesthesia with the patient or authorized representative who has indicated his/her understanding and acceptance.       Plan Discussed with: Anesthesiologist  Anesthesia Plan Comments:         Anesthesia Quick Evaluation

## 2018-03-19 NOTE — Op Note (Addendum)
Date of procedure: 03/19/18  Pre-operative diagnosis: Visually significant age-related cataract, Right Eye (H25.811)  Post-operative diagnosis: Visually significant age-related cataract, Right Eye  Procedure: Removal of cataract via phacoemulsification and insertion of intra-ocular lens [employer] Vision PCB00  +23.0D into the capsular bag of the Right Eye  Attending surgeon: Gerda Diss. Jose Corvin, MD, MA  Anesthesia: MAC, Topical Akten  Complications: None  Estimated Blood Loss: <72m (minimal)  Specimens: None  Implants: As above  Indications:  Visually significant age-related cataract, Right Eye  Procedure:  The patient was seen and identified in the pre-operative area. The operative eye was identified and dilated.  The operative eye was marked.  Topical anesthesia was administered to the operative eye.     The patient was then to the operative suite and placed in the supine position.  A timeout was performed confirming the patient, procedure to be performed, and all other relevant information.   The patient's face was prepped and draped in the usual fashion for intra-ocular surgery.  A lid speculum was placed into the operative eye and the surgical microscope moved into place and focused.  A superotemporal paracentesis was created using a 20 gauge paracentesis blade.  Shugarcaine was injected into the anterior chamber.  Viscoelastic was injected into the anterior chamber.  A temporal clear-corneal main wound incision was created using a 2.415mmicrokeratome.  A continuous curvilinear capsulorrhexis was initiated using an irrigating cystitome and completed using capsulorrhexis forceps.  Hydrodissection and hydrodeliniation were performed.  Viscoelastic was injected into the anterior chamber.  A phacoemulsification handpiece and a chopper as a second instrument were used to remove the nucleus and epinucleus. The irrigation/aspiration handpiece was used to remove any remaining cortical  material.   The capsular bag was reinflated with viscoelastic, checked, and found to be intact.  The intraocular lens was inserted into the capsular bag and dialed into place using a Kuglen hook.  The irrigation/aspiration handpiece was used to remove any remaining viscoelastic.  The clear corneal wound and paracentesis wounds were then hydrated and checked with Weck-Cels to be watertight.  The lid-speculum and drape was removed, and the patient's face was cleaned with a wet and dry 4x4.  Maxitrol was instilled in the eye before a clear shield was taped over the eye. The patient was taken to the post-operative care unit in good condition, having tolerated the procedure well.  Post-Op Instructions: [facility] and will receive all other orders and instructions.

## 2018-03-19 NOTE — Discharge Instructions (Signed)
Please discharge patient when stable, will follow up today with Dr. Carsin Randazzo at the [facility] office immediately following discharge.  Leave shield in place until visit.  All paperwork with discharge instructions will be given at the office. ° °

## 2018-03-19 NOTE — Transfer of Care (Signed)
Immediate Anesthesia Transfer of Care Note  Patient: [name] [name]  Procedure(s) Performed: CATARACT EXTRACTION PHACO AND INTRAOCULAR LENS PLACEMENT ([facility]) (Right Eye)  Patient Location: Short Stay  Anesthesia Type:MAC  Level of Consciousness: awake and patient cooperative  Airway & Oxygen Therapy: Patient Spontanous Breathing  Post-op Assessment: Report given to RN and Post -op Vital signs reviewed and stable  Post vital signs: Reviewed and stable  Last Vitals:  Vitals Value Taken Time  BP    Temp    Pulse    Resp    SpO2      Last Pain:  Vitals:   03/19/18 0702  TempSrc: Oral  PainSc: 0-No pain         Complications: No apparent anesthesia complications

## 2018-03-22 ENCOUNTER — Encounter (HOSPITAL_COMMUNITY): Payer: Self-pay | Admitting: Ophthalmology

## 2018-03-24 ENCOUNTER — Encounter (HOSPITAL_COMMUNITY)
Admission: RE | Admit: 2018-03-24 | Discharge: 2018-03-24 | Disposition: A | Discharge status: Home / Self Care | Payer: Medicare Other | Source: Ambulatory Visit | Attending: Ophthalmology | Admitting: Ophthalmology

## 2018-04-22 ENCOUNTER — Other Ambulatory Visit: Payer: Self-pay

## 2018-04-22 ENCOUNTER — Ambulatory Visit (INDEPENDENT_AMBULATORY_CARE_PROVIDER_SITE_OTHER): Payer: Medicare Other | Admitting: *Deleted

## 2018-04-22 DIAGNOSIS — Z5181 Encounter for therapeutic drug level monitoring: Secondary | ICD-10-CM | POA: Diagnosis not present

## 2018-04-22 DIAGNOSIS — I6350 Cerebral infarction due to unspecified occlusion or stenosis of unspecified cerebral artery: Secondary | ICD-10-CM

## 2018-04-22 LAB — POCT INR: INR: 2.5 (ref 2.0–3.0)

## 2018-04-22 NOTE — Patient Instructions (Signed)
Continue coumadin 1/2 tablet daily except 1 tablet on Sundays, Tuesdays and Thursdays Recheck in 6 weeks  

## 2018-04-27 ENCOUNTER — Encounter (HOSPITAL_COMMUNITY)
Admission: RE | Admit: 2018-04-27 | Discharge: 2018-04-27 | Disposition: A | Discharge status: Home / Self Care | Payer: Medicare Other | Source: Ambulatory Visit | Attending: Ophthalmology | Admitting: Ophthalmology

## 2018-04-30 ENCOUNTER — Encounter (HOSPITAL_COMMUNITY): Admission: RE | Payer: Self-pay | Source: Ambulatory Visit

## 2018-04-30 ENCOUNTER — Ambulatory Visit (HOSPITAL_COMMUNITY): Admission: RE | Admit: 2018-04-30 | Payer: Medicare Other | Source: Ambulatory Visit | Admitting: Ophthalmology

## 2018-04-30 SURGERY — PHACOEMULSIFICATION, CATARACT, WITH IOL INSERTION
Anesthesia: Monitor Anesthesia Care | Laterality: Left

## 2018-05-17 ENCOUNTER — Other Ambulatory Visit: Payer: Self-pay | Admitting: Cardiology

## 2018-05-26 ENCOUNTER — Other Ambulatory Visit: Payer: Self-pay

## 2018-05-26 ENCOUNTER — Telehealth: Payer: Self-pay | Admitting: *Deleted

## 2018-05-26 ENCOUNTER — Ambulatory Visit (INDEPENDENT_AMBULATORY_CARE_PROVIDER_SITE_OTHER): Payer: Medicare Other | Admitting: *Deleted

## 2018-05-26 DIAGNOSIS — I428 Other cardiomyopathies: Secondary | ICD-10-CM

## 2018-05-26 DIAGNOSIS — I5022 Chronic systolic (congestive) heart failure: Secondary | ICD-10-CM

## 2018-05-26 DIAGNOSIS — I429 Cardiomyopathy, unspecified: Secondary | ICD-10-CM

## 2018-05-26 LAB — CUP PACEART REMOTE DEVICE CHECK
Battery Remaining Longevity: 72 mo
Battery Remaining Percentage: 74 %
Battery Voltage: 2.96 V
Brady Statistic AP VP Percent: 1 %
Brady Statistic AP VS Percent: 24 %
Brady Statistic AS VP Percent: 1 %
Brady Statistic AS VS Percent: 75 %
Brady Statistic RA Percent Paced: 24 %
Brady Statistic RV Percent Paced: 1 %
Date Time Interrogation Session: 20200415060014
HighPow Impedance: 64 Ohm
HighPow Impedance: 64 Ohm
Implantable Lead Implant Date: 20100416
Implantable Lead Implant Date: 20100416
Implantable Lead Location: 753859
Implantable Lead Location: 753860
Implantable Lead Model: 7122
Implantable Pulse Generator Implant Date: 20170626
Lead Channel Impedance Value: 450 Ohm
Lead Channel Impedance Value: 460 Ohm
Lead Channel Pacing Threshold Amplitude: 0.75 V
Lead Channel Pacing Threshold Amplitude: 1 V
Lead Channel Pacing Threshold Pulse Width: 0.5 ms
Lead Channel Pacing Threshold Pulse Width: 0.5 ms
Lead Channel Sensing Intrinsic Amplitude: 1.6 mV
Lead Channel Sensing Intrinsic Amplitude: 12 mV
Lead Channel Setting Pacing Amplitude: 2 V
Lead Channel Setting Pacing Amplitude: 2.5 V
Lead Channel Setting Pacing Pulse Width: 0.5 ms
Lead Channel Setting Sensing Sensitivity: 0.5 mV
Pulse Gen Serial Number: 7370130

## 2018-05-26 NOTE — Telephone Encounter (Signed)

## 2018-05-31 DIAGNOSIS — Z79899 Other long term (current) drug therapy: Secondary | ICD-10-CM | POA: Diagnosis not present

## 2018-05-31 DIAGNOSIS — E876 Hypokalemia: Secondary | ICD-10-CM | POA: Diagnosis not present

## 2018-05-31 DIAGNOSIS — R7301 Impaired fasting glucose: Secondary | ICD-10-CM | POA: Diagnosis not present

## 2018-05-31 DIAGNOSIS — E782 Mixed hyperlipidemia: Secondary | ICD-10-CM | POA: Diagnosis not present

## 2018-05-31 DIAGNOSIS — I5022 Chronic systolic (congestive) heart failure: Secondary | ICD-10-CM | POA: Diagnosis not present

## 2018-06-01 NOTE — Progress Notes (Signed)
Remote ICD transmission.   

## 2018-06-02 ENCOUNTER — Telehealth: Payer: Self-pay | Admitting: *Deleted

## 2018-06-02 NOTE — Telephone Encounter (Signed)
COVID-19 Pre-Screening Questions: °  °· Do you currently have a fever?  no °·   °· Have you recently travelled on a cruise, internationally, or to [place], [place], [place], [place], [place], or [place], [place] ([employer]) ?   no °·   °· Have you been in contact with someone that is currently pending confirmation of Covid19 testing or has been confirmed to have the Covid19 virus?  no °·   °Are you currently experiencing fatigue or cough? no °   °  °  °  ° ° °

## 2018-06-02 NOTE — Telephone Encounter (Signed)
° °  COVID-19 Pre-Screening Questions: ° °• Do you currently have a fever? °•  °• Have you recently travelled on a cruise, internationally, or to [place], [place], [place], [place], [place], or [place], [place] ([employer]) ?  °•  °• Have you been in contact with someone that is currently pending confirmation of Covid19 testing or has been confirmed to have the Covid19 virus?  °•  °Are you currently experiencing fatigue or cough?  ° °

## 2018-06-03 ENCOUNTER — Ambulatory Visit (INDEPENDENT_AMBULATORY_CARE_PROVIDER_SITE_OTHER): Payer: Medicare Other | Admitting: *Deleted

## 2018-06-03 DIAGNOSIS — R7301 Impaired fasting glucose: Secondary | ICD-10-CM | POA: Diagnosis not present

## 2018-06-03 DIAGNOSIS — E782 Mixed hyperlipidemia: Secondary | ICD-10-CM | POA: Diagnosis not present

## 2018-06-03 DIAGNOSIS — I6350 Cerebral infarction due to unspecified occlusion or stenosis of unspecified cerebral artery: Secondary | ICD-10-CM | POA: Diagnosis not present

## 2018-06-03 DIAGNOSIS — Z6824 Body mass index (BMI) 24.0-24.9, adult: Secondary | ICD-10-CM | POA: Diagnosis not present

## 2018-06-03 DIAGNOSIS — Z5181 Encounter for therapeutic drug level monitoring: Secondary | ICD-10-CM | POA: Diagnosis not present

## 2018-06-03 DIAGNOSIS — E876 Hypokalemia: Secondary | ICD-10-CM | POA: Diagnosis not present

## 2018-06-03 DIAGNOSIS — I5022 Chronic systolic (congestive) heart failure: Secondary | ICD-10-CM | POA: Diagnosis not present

## 2018-06-03 LAB — POCT INR: INR: 2 (ref 2.0–3.0)

## 2018-06-03 NOTE — Patient Instructions (Signed)
Continue coumadin 1/2 tablet daily except 1 tablet on Sundays, Tuesdays and Thursdays Recheck in 6 weeks

## 2018-06-10 DIAGNOSIS — Z0001 Encounter for general adult medical examination with abnormal findings: Secondary | ICD-10-CM | POA: Diagnosis not present

## 2018-06-10 DIAGNOSIS — Z6824 Body mass index (BMI) 24.0-24.9, adult: Secondary | ICD-10-CM | POA: Diagnosis not present

## 2018-07-14 ENCOUNTER — Other Ambulatory Visit: Payer: Self-pay | Admitting: Cardiology

## 2018-07-15 ENCOUNTER — Ambulatory Visit (INDEPENDENT_AMBULATORY_CARE_PROVIDER_SITE_OTHER): Payer: Medicare Other | Admitting: *Deleted

## 2018-07-15 DIAGNOSIS — Z5181 Encounter for therapeutic drug level monitoring: Secondary | ICD-10-CM

## 2018-07-15 DIAGNOSIS — I6350 Cerebral infarction due to unspecified occlusion or stenosis of unspecified cerebral artery: Secondary | ICD-10-CM

## 2018-07-15 LAB — POCT INR: INR: 2 (ref 2.0–3.0)

## 2018-07-15 NOTE — Patient Instructions (Signed)
Take coumadin 1 1/2 tablets tonight then increase dose to 1 tablet daily except 1/2 tablet on Mondays, Wednesdays and Fridays Recheck in 6 weeks

## 2018-08-10 ENCOUNTER — Other Ambulatory Visit: Payer: Self-pay | Admitting: Cardiology

## 2018-08-25 ENCOUNTER — Ambulatory Visit (INDEPENDENT_AMBULATORY_CARE_PROVIDER_SITE_OTHER): Payer: Medicare Other | Admitting: *Deleted

## 2018-08-25 DIAGNOSIS — I428 Other cardiomyopathies: Secondary | ICD-10-CM | POA: Diagnosis not present

## 2018-08-25 DIAGNOSIS — Z5181 Encounter for therapeutic drug level monitoring: Secondary | ICD-10-CM | POA: Diagnosis not present

## 2018-08-25 DIAGNOSIS — I429 Cardiomyopathy, unspecified: Secondary | ICD-10-CM

## 2018-08-25 DIAGNOSIS — I6350 Cerebral infarction due to unspecified occlusion or stenosis of unspecified cerebral artery: Secondary | ICD-10-CM | POA: Diagnosis not present

## 2018-08-25 LAB — CUP PACEART REMOTE DEVICE CHECK
Date Time Interrogation Session: 20200715132504
Implantable Lead Implant Date: 20100416
Implantable Lead Implant Date: 20100416
Implantable Lead Location: 753859
Implantable Lead Location: 753860
Implantable Lead Model: 7122
Implantable Pulse Generator Implant Date: 20170626
Pulse Gen Serial Number: 7370130

## 2018-08-25 LAB — POCT INR: INR: 2.5 (ref 2.0–3.0)

## 2018-08-25 NOTE — Patient Instructions (Signed)
Continue coumadin 1 tablet daily except 1/2 tablet on Mondays, Wednesdays and Fridays Recheck in 6 weeks 

## 2018-08-26 ENCOUNTER — Telehealth: Payer: Medicare Other | Admitting: Cardiology

## 2018-09-03 ENCOUNTER — Encounter: Payer: Self-pay | Admitting: Cardiology

## 2018-09-03 NOTE — Progress Notes (Signed)
Remote ICD transmission.   

## 2018-09-24 DIAGNOSIS — H25812 Combined forms of age-related cataract, left eye: Secondary | ICD-10-CM | POA: Diagnosis not present

## 2018-09-24 NOTE — H&P (Signed)
Surgical History & Physical  Patient Name: [name] [name] DOB: [birth year]  Surgery: Cataract extraction with intraocular lens implant phacoemulsification; Left Eye  Surgeon: Baruch Goldmann MD Surgery Date:  10/01/2018 Pre-Op Date:  09/23/2018  HPI: A 44 Yr. old female patient 1. The patient is returning after cataract surgery. The right eye is affected. Status post cataract surgery, which began 1 weeks ago: Since the last visit, the affected area is doing well. The patient's vision is improved. Patient is following medication instructions. 2. Vision is still blurry in the left eye. There is a significant imbalance between the two eyes. Patient has an imbalance that is negatively affecting her quality of life. HPI was performed by Baruch Goldmann .  Medical History: Cataracts Cancer Diabetes Heart Problem Stroke  Review of Systems Negative Allergic/Immunologic Negative Cardiovascular Negative Constitutional Negative Gastrointestinal Negative Genitourinary Negative Ear, Nose, Mouth & Throat Negative Neurological Negative Respiratory  Social   Never smoked  Medication Artifical Tears, Brom/Gati/Pred,  Entresto, Furosemide, Lanoxin, Lisinopril, Macrodantin, Spironolactone, Toprol XL, Warfarin,   Sx/Procedures Phaco c IOL,  Gallbladder Removal, Mastectomy, Pacemaker, Hysterectomy, Vein removed from leg,   Drug Allergies   NKDA  History & Physical: Heent:  Cataract, Left eye NECK: supple without bruits LUNGS: lungs clear to auscultation CV: regular rate and rhythm Abdomen: soft and non-tender Impression & Plan: Assessment: 1.  COMBINED FORMS AGE RELATED CATARACT; Left Eye (H25.812) 2.  CATARACT EXTRACTION STATUS; Right Eye (Z98.41) 3.  CORNEAL OPACITY CENTRAL; Left Eye (H17.12)  Plan: 1.  Cataract accounts for the patient's decreased vision. This visual impairment is not correctable with a tolerable change in glasses or contact lenses. Cataract surgery with an  implantation of a new lens should significantly improve the visual and functional status of the patient. Discussed all risks, benefits, alternatives, and potential complications. Discussed the procedures and recovery. Patient desires to have surgery. A-scan ordered and performed today for intra-ocular lens calculations. The surgery will be performed in order to improve vision for driving, reading, and for eye examinations. Recommend phacoemulsification with intra-ocular lens. Left Eye. Surgery required to correct imbalance of vision. Dilates well - shugarcaine by protocol. 2.  1 week after cataract surgery. Doing well with improved vision and normal eye pressure. Call with any problems or concerns. Continue Gati-Brom-Pred 2x/day for 3 more weeks. 3.  Stable.

## 2018-09-24 NOTE — Patient Instructions (Signed)
[name] [name]  09/24/2018     @PREFPERIOPPHARMACY @   Your procedure is scheduled on  10/01/2018 .  Report to [facility] at  [place].M.  Call this number if you have problems the morning of surgery:  [phone]   Remember:  Do not eat or drink after midnight.                          Take these medicines the morning of surgery with A SIP OF WATER  Digoxin, metoprolol.    Do not wear jewelry, make-up or nail polish.  Do not wear lotions, powders, or perfume. Please wear deodorant and brush your teeth.  Do not shave 48 hours prior to surgery.  Men may shave face and neck.  Do not bring valuables to the hospital.  [facility] is not responsible for any belongings or valuables.  Contacts, dentures or bridgework may not be worn into surgery.  Leave your suitcase in the car.  After surgery it may be brought to your room.  For patients admitted to the hospital, discharge time will be determined by your treatment team.  Patients discharged the day of surgery will not be allowed to drive home.   Name and phone number of your driver:   family Special instructions:  None  Please read over the following fact sheets that you were given. Anesthesia Post-op Instructions and Care and Recovery After Surgery       Cataract Surgery, Care After This sheet gives you information about how to care for yourself after your procedure. Your health care provider may also give you more specific instructions. If you have problems or questions, contact your health care provider. What can I expect after the procedure? After the procedure, it is common to have:  Itching.  Discomfort.  Fluid discharge.  Sensitivity to light and to touch.  Bruising in or around the eye.  Mild blurred vision. Follow these instructions at home: Eye care   Do not touch or rub your eyes.  Protect your eyes as told by your health care provider. You may be told to wear a protective eye shield or  sunglasses.  Do not put a contact lens into the affected eye or eyes until your health care provider approves.  Keep the area around your eye clean and dry: ? Avoid swimming. ? Do not allow water to hit you directly in the face while showering. ? Keep soap and shampoo out of your eyes.  Check your eye every day for signs of infection. Watch for: ? Redness, swelling, or pain. ? Fluid, blood, or pus. ? Warmth. ? A bad smell. ? Vision that is getting worse. ? Sensitivity that is getting worse. Activity  Do not drive for 24 hours if you were given a sedative during your procedure.  Avoid strenuous activities, such as playing contact sports, for as long as told by your health care provider.  Do not drive or use heavy machinery until your health care provider approves.  Do not bend or lift heavy objects. Bending increases pressure in the eye. You can walk, climb stairs, and do light household chores.  Ask your health care provider when you can return to work. If you work in a dusty environment, you may be advised to wear protective eyewear for a period of time. General instructions  Take or apply over-the-counter and prescription medicines only as told by  your health care provider. This includes eye drops.  Keep all follow-up visits as told by your health care provider. This is important. Contact a health care provider if:  You have increased bruising around your eye.  You have pain that is not helped with medicine.  You have a fever.  You have redness, swelling, or pain in your eye.  You have fluid, blood, or pus coming from your incision.  Your vision gets worse.  Your sensitivity to light gets worse. Get help right away if:  You have sudden loss of vision.  You see flashes of light or spots (floaters).  You have severe eye pain.  You develop nausea or vomiting. Summary  After your procedure, it is common to have itching, discomfort, bruising, fluid discharge, or  sensitivity to light.  Follow instructions from your health care provider about caring for your eye after the procedure.  Do not rub your eye after the procedure. You may need to wear eye protection or sunglasses. Do not wear contact lenses. Keep the area around your eye clean and dry.  Avoid activities that require a lot of effort. These include playing sports and lifting heavy objects.  Contact a health care provider if you have increased bruising, pain that does not go away, or a fever. Get help right away if you suddenly lose your vision, see flashes of light or spots, or have severe pain in the eye. This information is not intended to replace advice given to you by your health care provider. Make sure you discuss any questions you have with your health care provider. Document Released: 08/16/2004 Document Revised: 07/27/2017 Document Reviewed: 07/27/2017 Elsevier Patient Education  2020 [place] After These instructions provide you with information about caring for yourself after your procedure. Your health care provider may also give you more specific instructions. Your treatment has been planned according to current medical practices, but problems sometimes occur. Call your health care provider if you have any problems or questions after your procedure. What can I expect after the procedure? After your procedure, you may:  Feel sleepy for several hours.  Feel clumsy and have poor balance for several hours.  Feel forgetful about what happened after the procedure.  Have poor judgment for several hours.  Feel nauseous or vomit.  Have a sore throat if you had a breathing tube during the procedure. Follow these instructions at home: For at least 24 hours after the procedure:      Have a responsible adult stay with you. It is important to have someone help care for you until you are awake and alert.  Rest as needed.  Do not: ? Participate  in activities in which you could fall or become injured. ? Drive. ? Use heavy machinery. ? Drink alcohol. ? Take sleeping pills or medicines that cause drowsiness. ? Make important decisions or sign legal documents. ? Take care of children on your own. Eating and drinking  Follow the diet that is recommended by your health care provider.  If you vomit, drink water, juice, or soup when you can drink without vomiting.  Make sure you have little or no nausea before eating solid foods. General instructions  Take over-the-counter and prescription medicines only as told by your health care provider.  If you have sleep apnea, surgery and certain medicines can increase your risk for breathing problems. Follow instructions from your health care provider about wearing your sleep device: ? Anytime you are sleeping,  including during daytime naps. ? While taking prescription pain medicines, sleeping medicines, or medicines that make you drowsy.  If you smoke, do not smoke without supervision.  Keep all follow-up visits as told by your health care provider. This is important. Contact a health care provider if:  You keep feeling nauseous or you keep vomiting.  You feel light-headed.  You develop a rash.  You have a fever. Get help right away if:  You have trouble breathing. Summary  For several hours after your procedure, you may feel sleepy and have poor judgment.  Have a responsible adult stay with you for at least 24 hours or until you are awake and alert. This information is not intended to replace advice given to you by your health care provider. Make sure you discuss any questions you have with your health care provider. Document Released: 05/20/2015 Document Revised: 04/27/2017 Document Reviewed: 05/20/2015 Elsevier Patient Education  2020 [employer].

## 2018-09-28 ENCOUNTER — Other Ambulatory Visit (HOSPITAL_COMMUNITY)
Admission: RE | Admit: 2018-09-28 | Discharge: 2018-09-28 | Disposition: A | Discharge status: Home / Self Care | Payer: Medicare Other | Source: Ambulatory Visit | Attending: Ophthalmology | Admitting: Ophthalmology

## 2018-09-28 ENCOUNTER — Encounter (HOSPITAL_COMMUNITY): Payer: Self-pay

## 2018-09-28 ENCOUNTER — Encounter (HOSPITAL_COMMUNITY)
Admission: RE | Admit: 2018-09-28 | Discharge: 2018-09-28 | Disposition: A | Discharge status: Home / Self Care | Payer: Medicare Other | Source: Ambulatory Visit | Attending: Ophthalmology | Admitting: Ophthalmology

## 2018-09-28 ENCOUNTER — Other Ambulatory Visit: Payer: Self-pay

## 2018-09-28 DIAGNOSIS — Z9049 Acquired absence of other specified parts of digestive tract: Secondary | ICD-10-CM | POA: Diagnosis not present

## 2018-09-28 DIAGNOSIS — Z01812 Encounter for preprocedural laboratory examination: Secondary | ICD-10-CM | POA: Insufficient documentation

## 2018-09-28 DIAGNOSIS — Z90710 Acquired absence of both cervix and uterus: Secondary | ICD-10-CM | POA: Diagnosis not present

## 2018-09-28 DIAGNOSIS — Z7901 Long term (current) use of anticoagulants: Secondary | ICD-10-CM | POA: Diagnosis not present

## 2018-09-28 DIAGNOSIS — Z8673 Personal history of transient ischemic attack (TIA), and cerebral infarction without residual deficits: Secondary | ICD-10-CM | POA: Diagnosis not present

## 2018-09-28 DIAGNOSIS — H25812 Combined forms of age-related cataract, left eye: Secondary | ICD-10-CM | POA: Diagnosis not present

## 2018-09-28 DIAGNOSIS — Z79899 Other long term (current) drug therapy: Secondary | ICD-10-CM | POA: Diagnosis not present

## 2018-09-28 DIAGNOSIS — Z950 Presence of cardiac pacemaker: Secondary | ICD-10-CM | POA: Diagnosis not present

## 2018-09-28 DIAGNOSIS — E1136 Type 2 diabetes mellitus with diabetic cataract: Secondary | ICD-10-CM | POA: Diagnosis not present

## 2018-09-28 DIAGNOSIS — Z859 Personal history of malignant neoplasm, unspecified: Secondary | ICD-10-CM | POA: Diagnosis not present

## 2018-09-28 DIAGNOSIS — Z20828 Contact with and (suspected) exposure to other viral communicable diseases: Secondary | ICD-10-CM | POA: Insufficient documentation

## 2018-09-28 LAB — BASIC METABOLIC PANEL
Anion gap: 7 (ref 5–15)
BUN: 16 mg/dL (ref 8–23)
CO2: 26 mmol/L (ref 22–32)
Calcium: 8.8 mg/dL — ABNORMAL LOW (ref 8.9–10.3)
Chloride: 105 mmol/L (ref 98–111)
Creatinine, Ser: 0.7 mg/dL (ref 0.44–1.00)
GFR calc Af Amer: >60 mL/min (ref >60)
GFR calc non Af Amer: >60 mL/min (ref >60)
Glucose, Bld: 118 mg/dL — ABNORMAL HIGH (ref 70–99)
Potassium: 4.2 mmol/L (ref 3.5–5.1)
Sodium: 138 mmol/L (ref 135–145)

## 2018-09-28 LAB — SARS CORONAVIRUS 2 (TAT 6-24 HRS): SARS Coronavirus 2: NEGATIVE

## 2018-10-01 ENCOUNTER — Ambulatory Visit (HOSPITAL_COMMUNITY)
Admission: RE | Admit: 2018-10-01 | Discharge: 2018-10-01 | Disposition: A | Discharge status: Home / Self Care | Payer: Medicare Other | Attending: Ophthalmology | Admitting: Ophthalmology

## 2018-10-01 ENCOUNTER — Other Ambulatory Visit: Payer: Self-pay

## 2018-10-01 ENCOUNTER — Ambulatory Visit (HOSPITAL_COMMUNITY): Payer: Medicare Other | Admitting: Anesthesiology

## 2018-10-01 ENCOUNTER — Encounter (HOSPITAL_COMMUNITY): Payer: Self-pay | Admitting: Anesthesiology

## 2018-10-01 ENCOUNTER — Encounter (HOSPITAL_COMMUNITY)
Admission: RE | Disposition: A | Discharge status: Home / Self Care | Payer: Self-pay | Source: Home / Self Care | Attending: Ophthalmology

## 2018-10-01 DIAGNOSIS — Z7901 Long term (current) use of anticoagulants: Secondary | ICD-10-CM | POA: Diagnosis not present

## 2018-10-01 DIAGNOSIS — Z859 Personal history of malignant neoplasm, unspecified: Secondary | ICD-10-CM | POA: Diagnosis not present

## 2018-10-01 DIAGNOSIS — Z20828 Contact with and (suspected) exposure to other viral communicable diseases: Secondary | ICD-10-CM | POA: Diagnosis not present

## 2018-10-01 DIAGNOSIS — H25812 Combined forms of age-related cataract, left eye: Secondary | ICD-10-CM | POA: Diagnosis not present

## 2018-10-01 DIAGNOSIS — E1136 Type 2 diabetes mellitus with diabetic cataract: Secondary | ICD-10-CM | POA: Insufficient documentation

## 2018-10-01 DIAGNOSIS — Z8673 Personal history of transient ischemic attack (TIA), and cerebral infarction without residual deficits: Secondary | ICD-10-CM | POA: Diagnosis not present

## 2018-10-01 DIAGNOSIS — Z79899 Other long term (current) drug therapy: Secondary | ICD-10-CM | POA: Diagnosis not present

## 2018-10-01 DIAGNOSIS — H2512 Age-related nuclear cataract, left eye: Secondary | ICD-10-CM | POA: Diagnosis not present

## 2018-10-01 DIAGNOSIS — Z9049 Acquired absence of other specified parts of digestive tract: Secondary | ICD-10-CM | POA: Diagnosis not present

## 2018-10-01 DIAGNOSIS — Z90710 Acquired absence of both cervix and uterus: Secondary | ICD-10-CM | POA: Insufficient documentation

## 2018-10-01 DIAGNOSIS — Z950 Presence of cardiac pacemaker: Secondary | ICD-10-CM | POA: Diagnosis not present

## 2018-10-01 HISTORY — PX: CATARACT EXTRACTION W/PHACO: SHX586

## 2018-10-01 SURGERY — PHACOEMULSIFICATION, CATARACT, WITH IOL INSERTION
Anesthesia: Monitor Anesthesia Care | Site: Eye | Laterality: Left

## 2018-10-01 MED ORDER — PROVISC 10 MG/ML IO SOLN
INTRAOCULAR | Status: DC | PRN
  Administered 2018-10-01: 0.85 mL via INTRAOCULAR

## 2018-10-01 MED ORDER — POVIDONE-IODINE 5 % OP SOLN
OPHTHALMIC | Status: DC | PRN
  Administered 2018-10-01: 1 via OPHTHALMIC

## 2018-10-01 MED ORDER — BSS IO SOLN
INTRAOCULAR | Status: DC | PRN
  Administered 2018-10-01: 15 mL

## 2018-10-01 MED ORDER — LIDOCAINE HCL (PF) 1 % IJ SOLN
INTRAOCULAR | Status: DC | PRN
  Administered 2018-10-01: .9 mL via OPHTHALMIC

## 2018-10-01 MED ORDER — MIDAZOLAM HCL 2 MG/2ML IJ SOLN
INTRAMUSCULAR | Status: AC
  Filled 2018-10-01: qty 2

## 2018-10-01 MED ORDER — TETRACAINE HCL 0.5 % OP SOLN
1.0000 [drp] | OPHTHALMIC | Status: AC | PRN
  Administered 2018-10-01 (×3): 1 [drp] via OPHTHALMIC

## 2018-10-01 MED ORDER — SODIUM CHLORIDE 0.9% FLUSH
10.0000 mL | INTRAVENOUS | Status: DC | PRN
  Administered 2018-10-01: 10:00:00 5 mL via INTRAVENOUS

## 2018-10-01 MED ORDER — LIDOCAINE HCL 3.5 % OP GEL
1.0000 "application " | Freq: Once | OPHTHALMIC | Status: AC
  Administered 2018-10-01: 1 via OPHTHALMIC

## 2018-10-01 MED ORDER — MIDAZOLAM HCL 2 MG/2ML IJ SOLN
INTRAMUSCULAR | Status: DC | PRN
  Administered 2018-10-01: 2 mg via INTRAVENOUS

## 2018-10-01 MED ORDER — PHENYLEPHRINE HCL 2.5 % OP SOLN
1.0000 [drp] | OPHTHALMIC | Status: AC | PRN
  Administered 2018-10-01 (×3): 1 [drp] via OPHTHALMIC

## 2018-10-01 MED ORDER — SODIUM HYALURONATE 23 MG/ML IO SOLN
INTRAOCULAR | Status: DC | PRN
  Administered 2018-10-01: 0.6 mL via INTRAOCULAR

## 2018-10-01 MED ORDER — NEOMYCIN-POLYMYXIN-DEXAMETH 3.5-10000-0.1 OP SUSP
OPHTHALMIC | Status: DC | PRN
  Administered 2018-10-01: 1 [drp] via OPHTHALMIC

## 2018-10-01 MED ORDER — CYCLOPENTOLATE-PHENYLEPHRINE 0.2-1 % OP SOLN
1.0000 [drp] | OPHTHALMIC | Status: AC | PRN
  Administered 2018-10-01 (×3): 1 [drp] via OPHTHALMIC

## 2018-10-01 MED ORDER — EPINEPHRINE PF 1 MG/ML IJ SOLN
INTRAOCULAR | Status: DC | PRN
  Administered 2018-10-01: 500 mL

## 2018-10-01 SURGICAL SUPPLY — 12 items

## 2018-10-01 NOTE — Interval H&P Note (Signed)
History and Physical Interval Note: The H and P was reviewed and updated. The patient was examined.  No changes were found after exam.  The surgical eye was marked.  10/01/2018 10:08 AM  [name] [name]  has presented today for surgery, with the diagnosis of nuclear cataract left eye.  The various methods of treatment have been discussed with the patient and family. After consideration of risks, benefits and other options for treatment, the patient has consented to  Procedure(s) with comments: CATARACT EXTRACTION PHACO AND INTRAOCULAR LENS PLACEMENT ([place]) (Left) - left as a surgical intervention.  The patient's history has been reviewed, patient examined, no change in status, stable for surgery.  I have reviewed the patient's chart and labs.  Questions were answered to the patient's satisfaction.     Baruch Goldmann

## 2018-10-01 NOTE — Transfer of Care (Signed)
Immediate Anesthesia Transfer of Care Note  Patient: [name] [name]  Procedure(s) Performed: CATARACT EXTRACTION PHACO AND INTRAOCULAR LENS PLACEMENT (IOC) (Left Eye)  Patient Location: Short stay  Anesthesia Type:MAC  Level of Consciousness: awake, alert  and patient cooperative  Airway & Oxygen Therapy: Patient Spontanous Breathing  Post-op Assessment: Report given to RN and Post -op Vital signs reviewed and stable  Post vital signs: Reviewed and stable  Last Vitals:  Vitals Value Taken Time  BP    Temp    Pulse    Resp    SpO2     See PACU flow sheet for vital signs  Last Pain:  Vitals:   10/01/18 0927  TempSrc: Oral  PainSc: 0-No pain         Complications: No apparent anesthesia complications

## 2018-10-01 NOTE — Op Note (Signed)
Date of procedure: 10/01/18  Pre-operative diagnosis: Visually significant age-related cataract, Left Eye (H25.812)  Post-operative diagnosis: Visually significant age-related cataract, Left Eye  Procedure: Removal of cataract via phacoemulsification and insertion of intra-ocular lens [employer] Vision PCB00  +23.5D into the capsular bag of the Left Eye  Attending surgeon: Gerda Diss. Sahid Borba, MD, MA  Anesthesia: MAC, Topical Akten  Complications: None  Estimated Blood Loss: <37m (minimal)  Specimens: None  Implants: As above  Indications:  Visually significant age-related cataract, Left Eye  Procedure:  The patient was seen and identified in the pre-operative area. The operative eye was identified and dilated.  The operative eye was marked.  Topical anesthesia was administered to the operative eye.     The patient was then to the operative suite and placed in the supine position.  A timeout was performed confirming the patient, procedure to be performed, and all other relevant information.   The patient's face was prepped and draped in the usual fashion for intra-ocular surgery.  A lid speculum was placed into the operative eye and the surgical microscope moved into place and focused.  An inferotemporal paracentesis was created using a 20 gauge paracentesis blade.  Shugarcaine was injected into the anterior chamber.  Viscoelastic was injected into the anterior chamber.  A temporal clear-corneal main wound incision was created using a 2.417mmicrokeratome.  A continuous curvilinear capsulorrhexis was initiated using an irrigating cystitome and completed using capsulorrhexis forceps.  Hydrodissection and hydrodeliniation were performed.  Viscoelastic was injected into the anterior chamber.  A phacoemulsification handpiece and a chopper as a second instrument were used to remove the nucleus and epinucleus. The irrigation/aspiration handpiece was used to remove any remaining cortical  material.   The capsular bag was reinflated with viscoelastic, checked, and found to be intact.  The intraocular lens was inserted into the capsular bag and dialed into place using a Kuglen hook.  The irrigation/aspiration handpiece was used to remove any remaining viscoelastic.  The clear corneal wound and paracentesis wounds were then hydrated and checked with Weck-Cels to be watertight.  The lid-speculum and drape was removed, and the patient's face was cleaned with a wet and dry 4x4.  Maxitrol was instilled in the eye before a clear shield was taped over the eye. The patient was taken to the post-operative care unit in good condition, having tolerated the procedure well.  Post-Op Instructions: [facility] and will receive all other orders and instructions.

## 2018-10-01 NOTE — Discharge Instructions (Signed)
Please discharge patient when stable, will follow up today with Dr. Brae Gartman at the [facility] office immediately following discharge.  Leave shield in place until visit.  All paperwork with discharge instructions will be given at the office. ° °

## 2018-10-01 NOTE — Anesthesia Procedure Notes (Signed)
Procedure Name: MAC Date/Time: 10/01/2018 10:13 AM Performed by: Vista Deck, CRNA Pre-anesthesia Checklist: Patient identified, Emergency Drugs available, Suction available, Timeout performed and Patient being monitored Patient Re-evaluated:Patient Re-evaluated prior to induction Oxygen Delivery Method: Nasal Cannula

## 2018-10-01 NOTE — Anesthesia Preprocedure Evaluation (Signed)
Anesthesia Evaluation  Patient identified by MRN, date of birth, ID band Patient awake    Reviewed: Allergy & Precautions, NPO status , Patient's Chart, lab work & pertinent test results, reviewed documented beta blocker date and time   Airway Mallampati: II  TM Distance: >3 FB Neck ROM: Full    Dental no notable dental hx. (+) Teeth Intact   Pulmonary neg pulmonary ROS,    Pulmonary exam normal breath sounds clear to auscultation       Cardiovascular Exercise Tolerance: Good +CHF  Normal cardiovascular exam+ pacemaker I Rhythm:Regular Rate:Normal     Neuro/Psych CVA, No Residual Symptoms negative psych ROS   GI/Hepatic negative GI ROS, Neg liver ROS,   Endo/Other  negative endocrine ROS  Renal/GU negative Renal ROS  negative genitourinary   Musculoskeletal negative musculoskeletal ROS (+)   Abdominal   Peds negative pediatric ROS (+)  Hematology negative hematology ROS (+)   Anesthesia Other Findings   Reproductive/Obstetrics negative OB ROS                             Anesthesia Physical Anesthesia Plan  ASA: III  Anesthesia Plan: MAC   Post-op Pain Management:    Induction: Intravenous  PONV Risk Score and Plan: 2 and Treatment may vary due to age or medical condition and Ondansetron  Airway Management Planned: Oral ETT and Nasal Cannula  Additional Equipment:   Intra-op Plan:   Post-operative Plan:   Informed Consent: I have reviewed the patients History and Physical, chart, labs and discussed the procedure including the risks, benefits and alternatives for the proposed anesthesia with the patient or authorized representative who has indicated his/her understanding and acceptance.     Dental advisory given  Plan Discussed with: CRNA  Anesthesia Plan Comments: (Plan Full PPE use  Plan MAC as previous after Q&A WTP with same as last time 03/19/18)         Anesthesia Quick Evaluation

## 2018-10-01 NOTE — Anesthesia Postprocedure Evaluation (Signed)
Anesthesia Post Note  Patient: [name] [name]  Procedure(s) Performed: CATARACT EXTRACTION PHACO AND INTRAOCULAR LENS PLACEMENT ([place]) (Left Eye)  Patient location during evaluation: Short Stay Anesthesia Type: MAC Level of consciousness: awake and alert Pain management: pain level controlled Vital Signs Assessment: post-procedure vital signs reviewed and stable Respiratory status: spontaneous breathing Cardiovascular status: stable Postop Assessment: no apparent nausea or vomiting Anesthetic complications: no     Last Vitals:  Vitals:   10/01/18 0927 10/01/18 1034  BP: (!) 102/56 (!) 99/51  Pulse: (!) 52 (!) 52  Resp: 18 16  Temp: 36.6 C 36.5 C  SpO2: 94% 99%    Last Pain:  Vitals:   10/01/18 1034  TempSrc: Oral  PainSc: 0-No pain                 Deriona Altemose

## 2018-10-04 ENCOUNTER — Encounter (HOSPITAL_COMMUNITY): Payer: Self-pay | Admitting: Ophthalmology

## 2018-10-06 ENCOUNTER — Ambulatory Visit (INDEPENDENT_AMBULATORY_CARE_PROVIDER_SITE_OTHER): Payer: Medicare Other | Admitting: *Deleted

## 2018-10-06 DIAGNOSIS — I6350 Cerebral infarction due to unspecified occlusion or stenosis of unspecified cerebral artery: Secondary | ICD-10-CM

## 2018-10-06 DIAGNOSIS — Z5181 Encounter for therapeutic drug level monitoring: Secondary | ICD-10-CM

## 2018-10-06 LAB — POCT INR: INR: 2.6 (ref 2.0–3.0)

## 2018-10-06 NOTE — Patient Instructions (Signed)
Continue coumadin 1 tablet daily except 1/2 tablet on Mondays, Wednesdays and Fridays Recheck in 6 weeks 

## 2018-10-11 ENCOUNTER — Other Ambulatory Visit (HOSPITAL_COMMUNITY): Payer: Self-pay | Admitting: Family Medicine

## 2018-10-11 DIAGNOSIS — Z1231 Encounter for screening mammogram for malignant neoplasm of breast: Secondary | ICD-10-CM

## 2018-10-13 ENCOUNTER — Other Ambulatory Visit: Payer: Self-pay | Admitting: Cardiology

## 2018-10-13 NOTE — Progress Notes (Signed)
.    HPI The patient has a history of a nonischemic cardiomyopathy. Her ejection fraction was at one point in time 15 - 20%. A later ejection fraction was about 40% in 2015.   Most recently the EF was 25% again.   I sent her for a CPX which demonstrated a good VO2 Max.  At the last visit I increased her beta blocker.    Since I last saw her she has done well.  The patient denies any new symptoms such as chest discomfort, neck or arm discomfort. There has been no new shortness of breath, PND or orthopnea. There have been no reported palpitations, presyncope or syncope.  No Known Allergies  Current Outpatient Medications  Medication Sig Dispense Refill  . BIOTIN PO Take 1 tablet by mouth daily.    . digoxin (LANOXIN) 0.125 MG tablet TAKE ONE TABLET BY MOUTH DAILY. (Patient taking differently: Take 0.125 mg by mouth daily. ) 90 tablet 3  . ENTRESTO 97-103 MG TAKE ONE TABLET BY MOUTH TWICE DAILY. (Patient taking differently: Take 1 tablet by mouth 2 (two) times daily. ) 180 tablet 3  . furosemide (LASIX) 40 MG tablet TAKE ONE TABLET BY MOUTH AS NEEDED. (Patient taking differently: Take 40 mg by mouth daily as needed for fluid. ) 90 tablet 3  . metoprolol succinate (TOPROL-XL) 50 MG 24 hr tablet TAKE ONE TABLET BY MOUTH TWICE DAILY (Patient taking differently: Take 50 mg by mouth 2 (two) times daily. ) 180 tablet 2  . Multiple Vitamin (MULTIVITAMIN WITH MINERALS) TABS tablet Take 1 tablet by mouth daily.    . Omega-3 Fatty Acids (FISH OIL PO) Take 1 capsule by mouth daily.     Marland Kitchen Propylene Glycol (SYSTANE COMPLETE) 0.6 % SOLN Place 1 drop into both eyes daily as needed (dry eyes).     Marland Kitchen spironolactone (ALDACTONE) 25 MG tablet TAKE 1/2 TABLET BY MOUTH DAILY. (Patient taking differently: Take 12.5 mg by mouth daily. K+ sparing diuretic: Hyperkalemia may occur with decreased renal function) 90 tablet 3  . warfarin (COUMADIN) 5 MG tablet TAKE 1/2 TO 1 TABLET BY MOUTH DAILY AS DIRECTED COUMADIN  CLINIC 90 tablet 0   No current facility-administered medications for this visit.     Past Medical History:  Diagnosis Date  . Arthritis    thumbs  . Breast cancer ([place])    right  . Cerebrovascular disease    s/p right MCA cardioemoblic infarct [id number]  . CHF (congestive heart failure) ([place])   . Nonischemic cardiomyopathy (HCC)    a. +LMNA mutation b. s/p STJ dual chamber ICD  . Stroke [facility]) 02/2008   no deficits    Past Surgical History:  Procedure Laterality Date  . ABDOMINAL HYSTERECTOMY    . BREAST IMPLANT REMOVAL Right 05/31/2015   Procedure: REMOVAL RIGHT  BREAST IMPLANTS;  Surgeon: Crissie Reese, MD;  Location: [place];  Service: Plastics;  Laterality: Right;  . BREAST RECONSTRUCTION Right 1995  . BREAST RECONSTRUCTION Right 05/31/2015   Procedure: RIGHT BREAST RECONSTRUCTION WITH SALINE IMPLANTS ;  Surgeon: Crissie Reese, MD;  Location: [place];  Service: Plastics;  Laterality: Right;  . CARDIAC DEFIBRILLATOR PLACEMENT  2010   STJ dual chamber ICD implanted for NICM by Dr Caryl Comes  . CARDIAC DEFIBRILLATOR PLACEMENT  05/26/2008  . CATARACT EXTRACTION W/PHACO Right 03/19/2018   Procedure: CATARACT EXTRACTION PHACO AND INTRAOCULAR LENS PLACEMENT ([place]);  Surgeon: Baruch Goldmann, MD;  Location: [facility];  Service: Ophthalmology;  Laterality: Right;  CDE: 5.42  .  CATARACT EXTRACTION W/PHACO Left 10/01/2018   Procedure: CATARACT EXTRACTION PHACO AND INTRAOCULAR LENS PLACEMENT ([facility]);  Surgeon: Baruch Goldmann, MD;  Location: [facility];  Service: Ophthalmology;  Laterality: Left;  left, CDE: 6.06  . CHOLECYSTECTOMY    . COLONOSCOPY    . EP IMPLANTABLE DEVICE N/A 08/06/2015   Procedure: ICD Generator Changeout;  Surgeon: Deboraha Sprang, MD;  Location: [place] [facility];  Service: Cardiovascular;  Laterality: N/A;  . ESOPHAGOGASTRODUODENOSCOPY    . MASTECTOMY Right   . MASTOPEXY Left 05/31/2015   Procedure: LEFT BREAST MASTOPEXY;  Surgeon: Crissie Reese, MD;  Location: [place];  Service: Plastics;   Laterality: Left;  . PACEMAKER INSERTION  05/2008  . TONSILLECTOMY    . VARICOSE VEIN SURGERY Left    leg    ROS:      As stated in the HPI and negative for all other systems.  PHYSICAL EXAM BP (!) 95/57   Pulse (!) 54   Ht 5\' 8"  (1.727 m)   Wt 165 lb (74.8 kg)   SpO2 96%   BMI 25.09 kg/m   GENERAL:  Well appearing NECK:  No jugular venous distention, waveform within normal limits, carotid upstroke brisk and symmetric, no bruits, no thyromegaly LUNGS:  Clear to auscultation bilaterally CHEST:  Unremarkable HEART:  PMI not displaced or sustained,S1 and S2 within normal limits, no S3, no S4, no clicks, no rubs, no murmurs ABD:  Flat, positive bowel sounds normal in frequency in pitch, no bruits, no rebound, no guarding, no midline pulsatile mass, no hepatomegaly, no splenomegaly EXT:  2 plus pulses throughout, no edema, no cyanosis no clubbing    EKG:   Atrial paced rhythm, rate 54, axis within normal limits, intervals within normal limits, no acute ST-T wave changes, interventricular conduction delay.     ASSESSMENT AND PLAN  CARDIOMYOPATHY, PRIMARY, DILATED - She is asymptomatic.  Her meds are at their peak blood pressure allow.  No change in therapy. sted.  Implantable defibrillator -  She has this followed by Dr. Caryl Comes.  Could not tell from last reading how frequently she is pacing in the atrium.  CVA -  Study history of cardioembolic CVA.  She continues on Coumadin.  No change in therapy.

## 2018-10-15 ENCOUNTER — Encounter: Payer: Self-pay | Admitting: Cardiology

## 2018-10-15 ENCOUNTER — Other Ambulatory Visit: Payer: Self-pay

## 2018-10-15 ENCOUNTER — Ambulatory Visit (INDEPENDENT_AMBULATORY_CARE_PROVIDER_SITE_OTHER): Payer: Medicare Other | Admitting: Cardiology

## 2018-10-15 VITALS — BP 95/57 | HR 54 | Ht 68.0 in | Wt 165.0 lb

## 2018-10-15 DIAGNOSIS — I420 Dilated cardiomyopathy: Secondary | ICD-10-CM | POA: Diagnosis not present

## 2018-10-15 DIAGNOSIS — I6350 Cerebral infarction due to unspecified occlusion or stenosis of unspecified cerebral artery: Secondary | ICD-10-CM | POA: Diagnosis not present

## 2018-10-15 DIAGNOSIS — Z95810 Presence of automatic (implantable) cardiac defibrillator: Secondary | ICD-10-CM

## 2018-10-15 NOTE — Patient Instructions (Addendum)
Medication Instructions:  Your physician recommends that you continue on your current medications as directed. Please refer to the Current Medication list given to you today.  If you need a refill on your cardiac medications before your next appointment, please call your pharmacy.   Lab work: NONE   Testing/Procedures: NONE  Follow-Up: At [facility], you and your health needs are our priority.  As part of our continuing mission to provide you with exceptional heart care, we have created designated Provider Care Teams.  These Care Teams include your primary Cardiologist (physician) and Advanced Practice Providers (APPs -  Physician Assistants and Nurse Practitioners) who all work together to provide you with the care you need, when you need it. You will need a follow up appointment in 12 months.  Please call our office 2 months in advance to schedule this appointment.  You may see DR [facility] or one of the following Advanced Practice Providers on your designated Care Team:   Rosaria Ferries, PA-C . Jory Sims, DNP, ANP  WILL REACH OUT TO DR Olin Pia OFFICE TO SEE ABOUT GETTING YOU FOLLOW UP VISIT

## 2018-11-22 ENCOUNTER — Other Ambulatory Visit: Payer: Self-pay | Admitting: Cardiology

## 2018-11-23 ENCOUNTER — Ambulatory Visit (INDEPENDENT_AMBULATORY_CARE_PROVIDER_SITE_OTHER): Payer: Medicare Other | Admitting: *Deleted

## 2018-11-23 ENCOUNTER — Other Ambulatory Visit: Payer: Self-pay

## 2018-11-23 DIAGNOSIS — I6350 Cerebral infarction due to unspecified occlusion or stenosis of unspecified cerebral artery: Secondary | ICD-10-CM

## 2018-11-23 DIAGNOSIS — Z5181 Encounter for therapeutic drug level monitoring: Secondary | ICD-10-CM | POA: Diagnosis not present

## 2018-11-23 DIAGNOSIS — Z1231 Encounter for screening mammogram for malignant neoplasm of breast: Secondary | ICD-10-CM | POA: Diagnosis not present

## 2018-11-23 LAB — POCT INR: INR: 3.2 — AB (ref 2.0–3.0)

## 2018-11-23 NOTE — Patient Instructions (Signed)
Take coumadin 1/2 tablet tonight then resume 1 tablet daily except 1/2 tablet on Mondays, Wednesdays and Fridays Recheck in 6 weeks

## 2018-11-25 ENCOUNTER — Ambulatory Visit (INDEPENDENT_AMBULATORY_CARE_PROVIDER_SITE_OTHER): Payer: Medicare Other | Admitting: *Deleted

## 2018-11-25 DIAGNOSIS — I5022 Chronic systolic (congestive) heart failure: Secondary | ICD-10-CM

## 2018-11-25 DIAGNOSIS — I420 Dilated cardiomyopathy: Secondary | ICD-10-CM | POA: Diagnosis not present

## 2018-11-25 LAB — CUP PACEART REMOTE DEVICE CHECK
Battery Remaining Longevity: 68 mo
Battery Remaining Percentage: 70 %
Battery Voltage: 2.96 V
Brady Statistic AP VP Percent: 1 %
Brady Statistic AP VS Percent: 24 %
Brady Statistic AS VP Percent: 1 %
Brady Statistic AS VS Percent: 76 %
Brady Statistic RA Percent Paced: 23 %
Brady Statistic RV Percent Paced: 1 %
Date Time Interrogation Session: 20201014060016
HighPow Impedance: 65 Ohm
HighPow Impedance: 65 Ohm
Implantable Lead Implant Date: 20100416
Implantable Lead Implant Date: 20100416
Implantable Lead Location: 753859
Implantable Lead Location: 753860
Implantable Lead Model: 7122
Implantable Pulse Generator Implant Date: 20170626
Lead Channel Impedance Value: 400 Ohm
Lead Channel Impedance Value: 400 Ohm
Lead Channel Pacing Threshold Amplitude: 0.75 V
Lead Channel Pacing Threshold Amplitude: 1 V
Lead Channel Pacing Threshold Pulse Width: 0.5 ms
Lead Channel Pacing Threshold Pulse Width: 0.5 ms
Lead Channel Sensing Intrinsic Amplitude: 1.4 mV
Lead Channel Sensing Intrinsic Amplitude: 12 mV
Lead Channel Setting Pacing Amplitude: 2 V
Lead Channel Setting Pacing Amplitude: 2.5 V
Lead Channel Setting Pacing Pulse Width: 0.5 ms
Lead Channel Setting Sensing Sensitivity: 0.5 mV
Pulse Gen Serial Number: 7370130

## 2018-12-09 NOTE — Progress Notes (Signed)
Remote ICD transmission.   

## 2018-12-23 DIAGNOSIS — Z23 Encounter for immunization: Secondary | ICD-10-CM | POA: Diagnosis not present

## 2019-01-03 ENCOUNTER — Ambulatory Visit (INDEPENDENT_AMBULATORY_CARE_PROVIDER_SITE_OTHER): Payer: Medicare Other | Admitting: *Deleted

## 2019-01-03 ENCOUNTER — Other Ambulatory Visit: Payer: Self-pay

## 2019-01-03 DIAGNOSIS — Z5181 Encounter for therapeutic drug level monitoring: Secondary | ICD-10-CM | POA: Diagnosis not present

## 2019-01-03 DIAGNOSIS — I6350 Cerebral infarction due to unspecified occlusion or stenosis of unspecified cerebral artery: Secondary | ICD-10-CM | POA: Diagnosis not present

## 2019-01-03 LAB — POCT INR: INR: 2.7 (ref 2.0–3.0)

## 2019-01-03 NOTE — Patient Instructions (Signed)
Continue warfarin 1 tablet daily except 1/2 tablet on Mondays, Wednesdays and Fridays Recheck in 6 weeks.  

## 2019-01-21 ENCOUNTER — Other Ambulatory Visit: Payer: Self-pay | Admitting: Cardiology

## 2019-02-15 ENCOUNTER — Other Ambulatory Visit: Payer: Self-pay | Admitting: Cardiology

## 2019-02-16 ENCOUNTER — Telehealth: Payer: Self-pay | Admitting: Cardiology

## 2019-02-16 NOTE — Telephone Encounter (Signed)
Pt says she has been recently diagnosed with COVID and she is very sick.. she noticed her O2 Sat was 88%... she says she is not SOB but feels very "bad" all over... she did not sound SOB on the phone but sounded very congested.   Pt denies productive cough and denies chest pain.Marland Kitchen no pain with breathing... I have urged her to call her PCP  Dr. Quillian Quince to talk with him about her symptoms and the potential for her to develop pneumonia.. pt says she will call now and I will also forward to Dr. Percival Spanish for any other recommendations.

## 2019-02-16 NOTE — Telephone Encounter (Signed)
Patient calling stating she has COVID and that her ogygen level has been low at around 88%. She wants to know if there is anything Dr. Percival Spanish recommends for her.

## 2019-02-17 ENCOUNTER — Telehealth: Payer: Self-pay | Admitting: Nurse Practitioner

## 2019-02-17 NOTE — Telephone Encounter (Signed)
We inquired about monoclonal antibody infusion but she is outside the window and this morning was starting to feel better.

## 2019-02-17 NOTE — Telephone Encounter (Signed)
Called to Discuss with patient about Covid symptoms and the use of bamlanivimab, a monoclonal antibody infusion for those with mild to moderate Covid symptoms and at a high risk of hospitalization.     Patient's symptoms started over 10 days ago and therefore she does not meet criteria for infusion.

## 2019-02-18 ENCOUNTER — Other Ambulatory Visit: Payer: Self-pay | Admitting: Cardiology

## 2019-02-19 ENCOUNTER — Other Ambulatory Visit: Payer: Self-pay

## 2019-02-19 ENCOUNTER — Inpatient Hospital Stay (HOSPITAL_COMMUNITY)
Admission: EM | Admit: 2019-02-19 | Discharge: 2019-02-23 | DRG: 177 | Disposition: A | Discharge status: Home Health | Payer: Medicare Other | Attending: Family Medicine | Admitting: Family Medicine

## 2019-02-19 ENCOUNTER — Emergency Department (HOSPITAL_COMMUNITY): Payer: Medicare Other

## 2019-02-19 ENCOUNTER — Encounter (HOSPITAL_COMMUNITY): Payer: Self-pay | Admitting: *Deleted

## 2019-02-19 DIAGNOSIS — Z90710 Acquired absence of both cervix and uterus: Secondary | ICD-10-CM | POA: Diagnosis not present

## 2019-02-19 DIAGNOSIS — J9600 Acute respiratory failure, unspecified whether with hypoxia or hypercapnia: Secondary | ICD-10-CM

## 2019-02-19 DIAGNOSIS — Z9841 Cataract extraction status, right eye: Secondary | ICD-10-CM | POA: Diagnosis not present

## 2019-02-19 DIAGNOSIS — J9601 Acute respiratory failure with hypoxia: Secondary | ICD-10-CM | POA: Diagnosis not present

## 2019-02-19 DIAGNOSIS — Z8673 Personal history of transient ischemic attack (TIA), and cerebral infarction without residual deficits: Secondary | ICD-10-CM

## 2019-02-19 DIAGNOSIS — I428 Other cardiomyopathies: Secondary | ICD-10-CM | POA: Diagnosis present

## 2019-02-19 DIAGNOSIS — Z7901 Long term (current) use of anticoagulants: Secondary | ICD-10-CM | POA: Diagnosis not present

## 2019-02-19 DIAGNOSIS — I5022 Chronic systolic (congestive) heart failure: Secondary | ICD-10-CM | POA: Diagnosis not present

## 2019-02-19 DIAGNOSIS — M1909 Primary osteoarthritis, other specified site: Secondary | ICD-10-CM | POA: Diagnosis present

## 2019-02-19 DIAGNOSIS — Z9049 Acquired absence of other specified parts of digestive tract: Secondary | ICD-10-CM

## 2019-02-19 DIAGNOSIS — J1282 Pneumonia due to coronavirus disease 2019: Secondary | ICD-10-CM | POA: Diagnosis present

## 2019-02-19 DIAGNOSIS — R0602 Shortness of breath: Secondary | ICD-10-CM

## 2019-02-19 DIAGNOSIS — Z95810 Presence of automatic (implantable) cardiac defibrillator: Secondary | ICD-10-CM | POA: Diagnosis present

## 2019-02-19 DIAGNOSIS — R531 Weakness: Secondary | ICD-10-CM | POA: Diagnosis not present

## 2019-02-19 DIAGNOSIS — Z961 Presence of intraocular lens: Secondary | ICD-10-CM | POA: Diagnosis present

## 2019-02-19 DIAGNOSIS — Z823 Family history of stroke: Secondary | ICD-10-CM

## 2019-02-19 DIAGNOSIS — Z79899 Other long term (current) drug therapy: Secondary | ICD-10-CM

## 2019-02-19 DIAGNOSIS — Z9882 Breast implant status: Secondary | ICD-10-CM | POA: Diagnosis not present

## 2019-02-19 DIAGNOSIS — Z853 Personal history of malignant neoplasm of breast: Secondary | ICD-10-CM | POA: Diagnosis not present

## 2019-02-19 DIAGNOSIS — U071 COVID-19: Secondary | ICD-10-CM | POA: Diagnosis present

## 2019-02-19 DIAGNOSIS — R05 Cough: Secondary | ICD-10-CM | POA: Diagnosis not present

## 2019-02-19 DIAGNOSIS — Z9842 Cataract extraction status, left eye: Secondary | ICD-10-CM | POA: Diagnosis not present

## 2019-02-19 HISTORY — DX: Acute respiratory failure, unspecified whether with hypoxia or hypercapnia: J96.00

## 2019-02-19 LAB — CBC WITH DIFFERENTIAL/PLATELET
Abs Immature Granulocytes: 0.06 10*3/uL (ref 0.00–0.07)
Basophils Absolute: 0 10*3/uL (ref 0.0–0.1)
Basophils Relative: 0 %
Eosinophils Absolute: 0 10*3/uL (ref 0.0–0.5)
Eosinophils Relative: 0 %
HCT: 32.5 % — ABNORMAL LOW (ref 36.0–46.0)
Hemoglobin: 10.6 g/dL — ABNORMAL LOW (ref 12.0–15.0)
Immature Granulocytes: 1 %
Lymphocytes Relative: 5 %
Lymphs Abs: 0.5 10*3/uL — ABNORMAL LOW (ref 0.7–4.0)
MCH: 29.7 pg (ref 26.0–34.0)
MCHC: 32.6 g/dL (ref 30.0–36.0)
MCV: 91 fL (ref 80.0–100.0)
Monocytes Absolute: 0.6 10*3/uL (ref 0.1–1.0)
Monocytes Relative: 6 %
Neutro Abs: 9 10*3/uL — ABNORMAL HIGH (ref 1.7–7.7)
Neutrophils Relative %: 88 %
Platelets: 218 10*3/uL (ref 150–400)
RBC: 3.57 MIL/uL — ABNORMAL LOW (ref 3.87–5.11)
RDW: 14 % (ref 11.5–15.5)
WBC: 10.1 10*3/uL (ref 4.0–10.5)
nRBC: 0 % (ref 0.0–0.2)

## 2019-02-19 LAB — COMPREHENSIVE METABOLIC PANEL
ALT: 43 U/L (ref 0–44)
AST: 38 U/L (ref 15–41)
Albumin: 3.3 g/dL — ABNORMAL LOW (ref 3.5–5.0)
Alkaline Phosphatase: 37 U/L — ABNORMAL LOW (ref 38–126)
Anion gap: 10 (ref 5–15)
BUN: 25 mg/dL — ABNORMAL HIGH (ref 8–23)
CO2: 24 mmol/L (ref 22–32)
Calcium: 8.8 mg/dL — ABNORMAL LOW (ref 8.9–10.3)
Chloride: 102 mmol/L (ref 98–111)
Creatinine, Ser: 0.85 mg/dL (ref 0.44–1.00)
GFR calc Af Amer: >60 mL/min (ref >60)
GFR calc non Af Amer: >60 mL/min (ref >60)
Glucose, Bld: 146 mg/dL — ABNORMAL HIGH (ref 70–99)
Potassium: 3.7 mmol/L (ref 3.5–5.1)
Sodium: 136 mmol/L (ref 135–145)
Total Bilirubin: 0.8 mg/dL (ref 0.3–1.2)
Total Protein: 6.8 g/dL (ref 6.5–8.1)

## 2019-02-19 LAB — TRIGLYCERIDES: Triglycerides: 167 mg/dL — ABNORMAL HIGH (ref <150)

## 2019-02-19 LAB — FIBRINOGEN: Fibrinogen: 597 mg/dL — ABNORMAL HIGH (ref 210–475)

## 2019-02-19 LAB — LACTATE DEHYDROGENASE: LDH: 203 U/L — ABNORMAL HIGH (ref 98–192)

## 2019-02-19 LAB — D-DIMER, QUANTITATIVE: D-Dimer, Quant: <0.27 ug/mL-FEU (ref 0.00–0.50)

## 2019-02-19 LAB — FERRITIN: Ferritin: 258 ng/mL (ref 11–307)

## 2019-02-19 LAB — PROTIME-INR
INR: 3.8 — ABNORMAL HIGH (ref 0.8–1.2)
Prothrombin Time: 37.8 seconds — ABNORMAL HIGH (ref 11.4–15.2)

## 2019-02-19 LAB — C-REACTIVE PROTEIN: CRP: 7.8 mg/dL — ABNORMAL HIGH (ref <1.0)

## 2019-02-19 LAB — LACTIC ACID, PLASMA
Lactic Acid, Venous: 1.1 mmol/L (ref 0.5–1.9)
Lactic Acid, Venous: 1 mmol/L (ref 0.5–1.9)

## 2019-02-19 LAB — PROCALCITONIN: Procalcitonin: <0.1 ng/mL

## 2019-02-19 MED ORDER — SACUBITRIL-VALSARTAN 97-103 MG PO TABS
1.0000 | ORAL_TABLET | Freq: Two times a day (BID) | ORAL | Status: DC
  Administered 2019-02-20 – 2019-02-23 (×7): 1 via ORAL
  Filled 2019-02-19 (×12): qty 1

## 2019-02-19 MED ORDER — METOPROLOL SUCCINATE ER 50 MG PO TB24
50.0000 mg | ORAL_TABLET | Freq: Two times a day (BID) | ORAL | Status: DC
  Administered 2019-02-19 – 2019-02-21 (×4): 50 mg via ORAL
  Administered 2019-02-22: 12:00:00 25 mg via ORAL
  Filled 2019-02-19 (×6): qty 1

## 2019-02-19 MED ORDER — SODIUM CHLORIDE 0.9 % IV SOLN
200.0000 mg | Freq: Once | INTRAVENOUS | Status: AC
  Administered 2019-02-19: 19:00:00 200 mg via INTRAVENOUS
  Filled 2019-02-19: qty 40

## 2019-02-19 MED ORDER — DIGOXIN 125 MCG PO TABS
125.0000 ug | ORAL_TABLET | Freq: Every day | ORAL | Status: DC
  Administered 2019-02-20 – 2019-02-23 (×4): 125 ug via ORAL
  Filled 2019-02-19 (×4): qty 1

## 2019-02-19 MED ORDER — DEXAMETHASONE SODIUM PHOSPHATE 10 MG/ML IJ SOLN
6.0000 mg | INTRAMUSCULAR | Status: DC
  Administered 2019-02-20 – 2019-02-23 (×4): 6 mg via INTRAVENOUS
  Filled 2019-02-19 (×2): qty 1
  Filled 2019-02-19: qty 0.6
  Filled 2019-02-19: qty 1
  Filled 2019-02-19: qty 0.6
  Filled 2019-02-19: qty 1

## 2019-02-19 MED ORDER — DEXAMETHASONE SODIUM PHOSPHATE 10 MG/ML IJ SOLN
6.0000 mg | Freq: Once | INTRAMUSCULAR | Status: AC
  Administered 2019-02-19: 6 mg via INTRAVENOUS
  Filled 2019-02-19: qty 1

## 2019-02-19 MED ORDER — ASCORBIC ACID 500 MG PO TABS
500.0000 mg | ORAL_TABLET | Freq: Every day | ORAL | Status: DC
  Administered 2019-02-19 – 2019-02-23 (×5): 500 mg via ORAL
  Filled 2019-02-19 (×5): qty 1

## 2019-02-19 MED ORDER — SPIRONOLACTONE 12.5 MG HALF TABLET
12.5000 mg | ORAL_TABLET | Freq: Every day | ORAL | Status: DC
  Administered 2019-02-20 – 2019-02-23 (×4): 12.5 mg via ORAL
  Filled 2019-02-19 (×6): qty 1

## 2019-02-19 MED ORDER — ONDANSETRON HCL 4 MG PO TABS: 4.0000 mg | ORAL_TABLET | Freq: Four times a day (QID) | ORAL | Status: DC | PRN

## 2019-02-19 MED ORDER — ACETAMINOPHEN 325 MG PO TABS: 650.0000 mg | ORAL_TABLET | Freq: Four times a day (QID) | ORAL | Status: DC | PRN

## 2019-02-19 MED ORDER — ONDANSETRON HCL 4 MG/2ML IJ SOLN: 4.0000 mg | Freq: Four times a day (QID) | INTRAMUSCULAR | Status: DC | PRN

## 2019-02-19 MED ORDER — GUAIFENESIN ER 600 MG PO TB12
600.0000 mg | ORAL_TABLET | Freq: Two times a day (BID) | ORAL | Status: AC
  Administered 2019-02-19 – 2019-02-21 (×4): 600 mg via ORAL
  Filled 2019-02-19 (×4): qty 1

## 2019-02-19 MED ORDER — SODIUM CHLORIDE 0.9 % IV SOLN
100.0000 mg | Freq: Every day | INTRAVENOUS | Status: AC
  Administered 2019-02-20 – 2019-02-23 (×4): 100 mg via INTRAVENOUS
  Filled 2019-02-19: qty 100
  Filled 2019-02-19 (×3): qty 20

## 2019-02-19 MED ORDER — POLYETHYLENE GLYCOL 3350 17 G PO PACK: 17.0000 g | PACK | Freq: Every day | ORAL | Status: DC | PRN

## 2019-02-19 MED ORDER — ALBUTEROL SULFATE HFA 108 (90 BASE) MCG/ACT IN AERS: 4.0000 | INHALATION_SPRAY | RESPIRATORY_TRACT | Status: DC | PRN

## 2019-02-19 MED ORDER — AEROCHAMBER PLUS FLO-VU MEDIUM MISC
1.0000 | Freq: Once | Status: AC
  Administered 2019-02-19: 14:00:00 1
  Filled 2019-02-19 (×2): qty 1

## 2019-02-19 MED ORDER — ALBUTEROL SULFATE HFA 108 (90 BASE) MCG/ACT IN AERS
4.0000 | INHALATION_SPRAY | Freq: Once | RESPIRATORY_TRACT | Status: AC
  Administered 2019-02-19: 14:00:00 4 via RESPIRATORY_TRACT
  Filled 2019-02-19: qty 6.7

## 2019-02-19 MED ORDER — ORAL CARE MOUTH RINSE
15.0000 mL | Freq: Two times a day (BID) | OROMUCOSAL | Status: DC
  Administered 2019-02-19 – 2019-02-23 (×8): 15 mL via OROMUCOSAL

## 2019-02-19 MED ORDER — ZINC SULFATE 220 (50 ZN) MG PO CAPS
220.0000 mg | ORAL_CAPSULE | Freq: Every day | ORAL | Status: DC
  Administered 2019-02-19 – 2019-02-23 (×5): 220 mg via ORAL
  Filled 2019-02-19 (×4): qty 1

## 2019-02-19 NOTE — ED Notes (Signed)
  Pt no visible distress.   Pt denies any SOB at this time.  Does c/o slight cough. Also c/o of headache.    Pt ambulatory to bathroom and did have exertional SOB.  Oxygen sats after ambulating went down to 85%.  Pt requesting something to drink.  Water and gingerale  given.

## 2019-02-19 NOTE — H&P (Addendum)
History and Physical    INGRI KATA [id number] DOB: Oct 27, 1950 DOA: 02/19/2019  PCP: Neale Burly, MD   Patient coming from: Home  I have personally briefly reviewed patient's old medical records in [place]  Chief Complaint: Hypoxia, COVID positive.  HPI: KIMBERLYANNE CHOUDHURY is a 69 y.o. female with medical history significant for systolic CHF, AICD status, cardioembolic CVA on anticoagulation.  Patient tested positive for Covid 02/13/2017, she had been feeling ill since 12/28-with fever chills cough , fatigue, dyspnea and diarrhea.  Patient reports increasing cough, and that her O2 sats was 79% at home.  She is not on home oxygen.  She denies lower extremity swelling.  No chest pain.  She reports compliance with her warfarin to she has not had her INR checked in at least 6 months due to Covid pandemic.  ED Course: Sats down to 86%, blood pressure systolic 10 8-1 [id number], temperature 99.2.  Elevation in some inflammatory markers LDH, fibrinogen, and CRP.  Procalcitonin and D-dimer unremarkable.  Chest x-ray showed atelectasis changes in the right lung base.  WBC 10.1.  Dexamethasone 6 mg and albuterol inhaler given in ED.  Hospitalist to admit for acute respiratory failure.  Review of Systems: As per HPI all other systems reviewed and negative.  Past Medical History:  Diagnosis Date  . Arthritis    thumbs  . Breast cancer ([place])    right  . Cerebrovascular disease    s/p right MCA cardioemoblic infarct [id number]  . CHF (congestive heart failure) ([place])   . Nonischemic cardiomyopathy (HCC)    a. +LMNA mutation b. s/p STJ dual chamber ICD  . Stroke [facility]) 02/2008   no deficits    Past Surgical History:  Procedure Laterality Date  . ABDOMINAL HYSTERECTOMY    . BREAST IMPLANT REMOVAL Right 05/31/2015   Procedure: REMOVAL RIGHT  BREAST IMPLANTS;  Surgeon: Crissie Reese, MD;  Location: [place];  Service: Plastics;  Laterality: Right;  . BREAST RECONSTRUCTION Right 1995  . BREAST  RECONSTRUCTION Right 05/31/2015   Procedure: RIGHT BREAST RECONSTRUCTION WITH SALINE IMPLANTS ;  Surgeon: Crissie Reese, MD;  Location: [place];  Service: Plastics;  Laterality: Right;  . CARDIAC DEFIBRILLATOR PLACEMENT  2010   STJ dual chamber ICD implanted for NICM by Dr Caryl Comes  . CARDIAC DEFIBRILLATOR PLACEMENT  05/26/2008  . CATARACT EXTRACTION W/PHACO Right 03/19/2018   Procedure: CATARACT EXTRACTION PHACO AND INTRAOCULAR LENS PLACEMENT ([place]);  Surgeon: Baruch Goldmann, MD;  [facility];  Service: Ophthalmology;  Laterality: Right;  CDE: 5.42  . CATARACT EXTRACTION W/PHACO Left 10/01/2018   Procedure: CATARACT EXTRACTION PHACO AND INTRAOCULAR LENS PLACEMENT ([facility]);  Surgeon: Baruch Goldmann, MD;  [facility];  Service: Ophthalmology;  Laterality: Left;  left, CDE: 6.06  . CHOLECYSTECTOMY    . COLONOSCOPY    . EP IMPLANTABLE DEVICE N/A 08/06/2015   Procedure: ICD Generator Changeout;  Surgeon: Deboraha Sprang, MD;  Location: [place] [facility];  Service: Cardiovascular;  Laterality: N/A;  . ESOPHAGOGASTRODUODENOSCOPY    . MASTECTOMY Right   . MASTOPEXY Left 05/31/2015   Procedure: LEFT BREAST MASTOPEXY;  Surgeon: Crissie Reese, MD;  Location: [place];  Service: Plastics;  Laterality: Left;  . PACEMAKER INSERTION  05/2008  . TONSILLECTOMY    . VARICOSE VEIN SURGERY Left    leg     reports that she has never smoked. She has never used smokeless tobacco. She reports that she does not drink alcohol or use drugs.  No Known Allergies  Family History  Problem Relation Age of Onset  . Cardiomyopathy Other        family hx of  . Other Brother        s/p heart transplant  . Stroke Other        family hx of    Prior to Admission medications   Medication Sig Start Date End Date Taking? Authorizing Provider  BIOTIN PO Take 1 tablet by mouth daily.   Yes [provider]  digoxin (LANOXIN) 0.125 MG tablet TAKE ONE TABLET BY MOUTH DAILY. 11/23/18  Yes Minus Breeding, MD  ENTRESTO  97-103 MG TAKE ONE TABLET BY MOUTH TWICE DAILY. 01/21/19  Yes Minus Breeding, MD  furosemide (LASIX) 40 MG tablet TAKE ONE TABLET BY MOUTH AS NEEDED. Patient taking differently: Take 40 mg by mouth daily as needed for fluid.  11/02/17  Yes Minus Breeding, MD  metoprolol succinate (TOPROL-XL) 50 MG 24 hr tablet TAKE ONE TABLET BY MOUTH TWICE DAILY 02/18/19  Yes Minus Breeding, MD  Multiple Vitamin (MULTIVITAMIN WITH MINERALS) TABS tablet Take 1 tablet by mouth daily.   Yes [provider]  Omega-3 Fatty Acids (FISH OIL PO) Take 1 capsule by mouth daily.    Yes [provider]  Propylene Glycol (SYSTANE COMPLETE) 0.6 % SOLN Place 1 drop into both eyes daily as needed (dry eyes).    Yes [provider]  spironolactone (ALDACTONE) 25 MG tablet Take 0.5 tablets (12.5 mg total) by mouth daily. K+ sparing diuretic: Hyperkalemia may occur with decreased renal function 02/17/19  Yes Hochrein, Jeneen Rinks, MD  warfarin (COUMADIN) 5 MG tablet TAKE 1/2 TO 1 TABLET BY MOUTH DAILY AS DIRECTED COUMADIN CLINIC Patient taking differently: Take 2.5-5 mg by mouth daily. Take 5mg  on Monday, Wednesday, Friday. And 2.5 mg on Sunday, Tuesday, Thursday, Saturday. 10/13/18  Yes Minus Breeding, MD    Physical Exam: Vitals:   02/19/19 1230 02/19/19 1300 02/19/19 1400 02/19/19 1601  BP: 114/64 123/65 (!) 108/52 108/61  Pulse: 72 69 74 67  Resp: (!) 25 (!) 22 20 19   Temp:      TempSrc:      SpO2: 92% (!) 89% (!) 87% 95%  Weight:      Height:        Constitutional: NAD, calm, comfortable Vitals:   02/19/19 1230 02/19/19 1300 02/19/19 1400 02/19/19 1601  BP: 114/64 123/65 (!) 108/52 108/61  Pulse: 72 69 74 67  Resp: (!) 25 (!) 22 20 19   Temp:      TempSrc:      SpO2: 92% (!) 89% (!) 87% 95%  Weight:      Height:       Eyes: PERRL, lids and conjunctivae normal ENMT: Mucous membranes are moist. Posterior pharynx clear of any exudate or lesions.Normal dentition.  Neck: normal, supple, no  masses, no thyromegaly Respiratory: clear to auscultation bilaterally, no wheezing, no crackles. Normal respiratory effort. No accessory muscle use.  Cardiovascular: Regular rate and rhythm, no murmurs / rubs / gallops. No extremity edema. 2+ pedal pulses. No carotid bruits.  Abdomen: no tenderness, no masses palpated. No hepatosplenomegaly. Bowel sounds positive.  Musculoskeletal: no clubbing / cyanosis. No joint deformity upper and lower extremities. Good ROM, no contractures. Normal muscle tone.  Skin: no rashes, lesions, ulcers. No induration Neurologic: CN 2-12 grossly intact. Strength 5/5 in all 4.  Psychiatric: Normal judgment and insight. Alert and oriented x 3. Normal mood.   Labs on Admission: I have  personally reviewed following labs and imaging studies  CBC: Recent Labs  Lab 02/19/19 1244  WBC 10.1  NEUTROABS 9.0*  HGB 10.6*  HCT 32.5*  MCV 91.0  PLT 99991111   Basic Metabolic Panel: Recent Labs  Lab 02/19/19 1244  NA 136  K 3.7  CL 102  CO2 24  GLUCOSE 146*  BUN 25*  CREATININE 0.85  CALCIUM 8.8*   Liver Function Tests: Recent Labs  Lab 02/19/19 1244  AST 38  ALT 43  ALKPHOS 37*  BILITOT 0.8  PROT 6.8  ALBUMIN 3.3*   Lipid Profile: Recent Labs    02/19/19 1244  TRIG 167*   Anemia Panel: Recent Labs    02/19/19 1244  FERRITIN 258    Radiological Exams on Admission: DG Chest Port 1 View  Result Date: 02/19/2019 CLINICAL DATA:  Cough and shortness of breath. COVID-19 positive. Decreased oxygen saturation EXAM: PORTABLE CHEST 1 VIEW COMPARISON:  May 27, 2008. FINDINGS: There is atelectatic change in the right base. Lungs elsewhere are clear. Heart size and pulmonary vascularity are normal. No adenopathy. Pacemaker leads are attached the right atrium and right ventricle. No adenopathy. There are surgical clips in the right axillary region. IMPRESSION: Atelectatic change right base. Lungs elsewhere clear. Cardiac silhouette within normal limits.  Pacemaker leads attached to right atrium and right ventricle. No evident adenopathy. Electronically Signed   By: Lowella Grip III M.D.   On: 02/19/2019 13:30    EKG: Independently reviewed.,  QTc 460.  No significant ST or T wave abnormalities compared to prior EKG.  Assessment/Plan Principal Problem:   Acute respiratory failure (HCC) Active Problems:   SYSTOLIC HEART FAILURE, CHRONIC   ICD (implantable cardioverter-defibrillator), dual, in situ   History of cardioembolic cerebrovascular accident (CVA) on anticoagulation.   Acute hypoxic respiratory failure with COVID-19 infection-O2 sats down to 86% on room air improved with 3 L.  Chest x-ray shows atelectatic change in the right base.  Elevation in some inflammatory markers CRP, fibrinogen and LDH.  Procalcitonin and D-dimer are unremarkable.  -Continue dexamethasone 6 mg daily -Consult pharmacy for Remdesivir, atelectatic change may be early pneumonia -Repeat chest x-ray in a.m. -Daily inflammatory markers -CBC, CMP daily -Will check INR - 3.8, reports compliance with warfarin, with unremarkable D-dimer, pulmonary embolism as etiology for hypoxia is very unlikely.   -Covid admission protocol -Albuterol inhaler as needed, mucolytics.  Chronic systolic heart failure, AICD status - stable and compensated.  Blood pressure soft, systolic [id number].  In the past EF has been down to 15 - 20%.  Last echo on file 2018, EF 45 to 50%. -Will hold home 40mg  PRN Lasix for now. -Resume home metoprolol, spironolactone and Entresto in a.m.  Cardioembolic [id number].  According the setting of apical thrombus.  INR today 3.8. -Warfarin per pharmacy  DVT prophylaxis: Warfarin Code Status: Full code Family Communication: None at bedside. Patient has a nephew who works in the ICU here at [place] as a Marine scientist. She will like [place] to communicate with her nephew, and he can communicate with her family.  Disposition Plan: Per rounding team Consults called:  None Admission status: Inpatient, telemetry I certify that at the point of admission it is my clinical judgment that the patient will require inpatient hospital care spanning beyond 2 midnights from the point of admission due to high intensity of service, high risk for further deterioration and high frequency of surveillance required. The following factors support the patient status of inpatient: Acute respiratory failure from  Covid 19 infection, requiring steroids and likely antiviral treatments.   Bethena Roys MD [facility]  02/19/2019, 6:06 PM

## 2019-02-19 NOTE — ED Triage Notes (Signed)
Pt with covid, coughing more and noted her sats 79% at home.  Pt is not on oxygen at home.  Tested positive on 1/4.

## 2019-02-19 NOTE — ED Provider Notes (Signed)
[facility] Provider Note   CSN: [id number] Arrival date & time: 02/19/19  1100     History Chief Complaint  Patient presents with  . Covid    [name] [name] is a 69 y.o. female with a history of prior CVA, CHF with last EF 45-50%, nonischemic cardiomyopathy, ICD in place, and breast cancer in remission who presents to the ED with complaints of hypoxia on @ home pulse oximeter in the setting of positive covid 19 testing 02/13/18. Patient states she has felt somewhat poorly since 02/07/19, she has had fever, chills, dry cough, significant fatigue/generalized weakness, dyspnea on exertion, & diarrhea.  No other alleviating or aggravating factors to her symptoms.  She has been checking an at home pulse oximeter and noted her oxygen dropping into the low 80s and upper 70s prompting ED visit.  She denies syncope, chest pain, unilateral leg pain/swelling, abdominal pain, vomiting, or hemoptysis.  HPI     Past Medical History:  Diagnosis Date  . Arthritis    thumbs  . Breast cancer ([place])    right  . Cerebrovascular disease    s/p right MCA cardioemoblic infarct [id number]  . CHF (congestive heart failure) ([place])   . Nonischemic cardiomyopathy (HCC)    a. +LMNA mutation b. s/p STJ dual chamber ICD  . Stroke [facility]) 02/2008   no deficits    Patient Active Problem List   Diagnosis Date Noted  . History of reconstruction of right breast 05/31/2015  . Long term (current) use of anticoagulants 05/06/2010  . SYSTOLIC HEART FAILURE, CHRONIC 06/12/2009  . ICD (implantable cardioverter-defibrillator), dual, in situ 03/06/2009  . Congestive dilated cardiomyopathy ([place]) 05/18/2008  . CVA 05/18/2008  . HYPOTENSION, CHRONIC 05/18/2008  . NECK PAIN 04/11/2008  . FATIGUE / MALAISE 03/16/2008    Past Surgical History:  Procedure Laterality Date  . ABDOMINAL HYSTERECTOMY    . BREAST IMPLANT REMOVAL Right 05/31/2015   Procedure: REMOVAL RIGHT  BREAST IMPLANTS;  Surgeon:  Crissie Reese, MD;  Location: [place];  Service: Plastics;  Laterality: Right;  . BREAST RECONSTRUCTION Right 1995  . BREAST RECONSTRUCTION Right 05/31/2015   Procedure: RIGHT BREAST RECONSTRUCTION WITH SALINE IMPLANTS ;  Surgeon: Crissie Reese, MD;  Location: [place];  Service: Plastics;  Laterality: Right;  . CARDIAC DEFIBRILLATOR PLACEMENT  2010   STJ dual chamber ICD implanted for NICM by Dr Caryl Comes  . CARDIAC DEFIBRILLATOR PLACEMENT  05/26/2008  . CATARACT EXTRACTION W/PHACO Right 03/19/2018   Procedure: CATARACT EXTRACTION PHACO AND INTRAOCULAR LENS PLACEMENT ([place]);  Surgeon: Baruch Goldmann, MD;  Location: [facility];  Service: Ophthalmology;  Laterality: Right;  CDE: 5.42  . CATARACT EXTRACTION W/PHACO Left 10/01/2018   Procedure: CATARACT EXTRACTION PHACO AND INTRAOCULAR LENS PLACEMENT ([facility]);  Surgeon: Baruch Goldmann, MD;  Location: [facility];  Service: Ophthalmology;  Laterality: Left;  left, CDE: 6.06  . CHOLECYSTECTOMY    . COLONOSCOPY    . EP IMPLANTABLE DEVICE N/A 08/06/2015   Procedure: ICD Generator Changeout;  Surgeon: Deboraha Sprang, MD;  Location: [place] [facility];  Service: Cardiovascular;  Laterality: N/A;  . ESOPHAGOGASTRODUODENOSCOPY    . MASTECTOMY Right   . MASTOPEXY Left 05/31/2015   Procedure: LEFT BREAST MASTOPEXY;  Surgeon: Crissie Reese, MD;  Location: [place];  Service: Plastics;  Laterality: Left;  . PACEMAKER INSERTION  05/2008  . TONSILLECTOMY    . VARICOSE VEIN SURGERY Left    leg     OB History   No obstetric history on  file.     Family History  Problem Relation Age of Onset  . Cardiomyopathy Other        family hx of  . Other Brother        s/p heart transplant  . Stroke Other        family hx of    Social History   Tobacco Use  . Smoking status: Never Smoker  . Smokeless tobacco: Never Used  Substance Use Topics  . Alcohol use: No  . Drug use: No    Home Medications Prior to Admission medications   Medication Sig Start Date End Date Taking?  Authorizing Provider  BIOTIN PO Take 1 tablet by mouth daily.   Yes [provider]  digoxin (LANOXIN) 0.125 MG tablet TAKE ONE TABLET BY MOUTH DAILY. 11/23/18  Yes Minus Breeding, MD  ENTRESTO 97-103 MG TAKE ONE TABLET BY MOUTH TWICE DAILY. 01/21/19  Yes Minus Breeding, MD  furosemide (LASIX) 40 MG tablet TAKE ONE TABLET BY MOUTH AS NEEDED. Patient taking differently: Take 40 mg by mouth daily as needed for fluid.  11/02/17  Yes Minus Breeding, MD  metoprolol succinate (TOPROL-XL) 50 MG 24 hr tablet TAKE ONE TABLET BY MOUTH TWICE DAILY 02/18/19  Yes Minus Breeding, MD  Multiple Vitamin (MULTIVITAMIN WITH MINERALS) TABS tablet Take 1 tablet by mouth daily.   Yes [provider]  Omega-3 Fatty Acids (FISH OIL PO) Take 1 capsule by mouth daily.    Yes [provider]  Propylene Glycol (SYSTANE COMPLETE) 0.6 % SOLN Place 1 drop into both eyes daily as needed (dry eyes).    Yes [provider]  spironolactone (ALDACTONE) 25 MG tablet Take 0.5 tablets (12.5 mg total) by mouth daily. K+ sparing diuretic: Hyperkalemia may occur with decreased renal function 02/17/19  Yes Hochrein, Jeneen Rinks, MD  warfarin (COUMADIN) 5 MG tablet TAKE 1/2 TO 1 TABLET BY MOUTH DAILY AS DIRECTED COUMADIN CLINIC Patient taking differently: Take 2.5-5 mg by mouth daily. Take 5mg  on Monday, Wednesday, Friday. And 2.5 mg on Sunday, Tuesday, Thursday, Saturday. 10/13/18  Yes Minus Breeding, MD    Allergies    Patient has no known allergies.  Review of Systems   Review of Systems  Constitutional: Positive for chills and fever.  HENT: Negative for sore throat.   Respiratory: Positive for cough and shortness of breath.   Cardiovascular: Negative for chest pain and leg swelling.  Gastrointestinal: Positive for diarrhea. Negative for abdominal pain, nausea and vomiting.  Genitourinary: Negative for dysuria.  Musculoskeletal: Negative for myalgias.  Neurological: Positive for weakness (generalized  ). Negative for syncope.  All other systems reviewed and are negative.   Physical Exam Updated Vital Signs BP (!) 108/55 (BP Location: Right Arm)   Pulse 81   Temp 99.2 F (37.3 C) (Oral)   Resp (!) 22   Ht 5\' 8"  (1.727 m)   Wt 72.6 kg   SpO2 (!) 86% Comment: rechecked   BMI 24.33 kg/m   Physical Exam Vitals and nursing note reviewed.  Constitutional:      General: She is not in acute distress.    Appearance: She is well-developed. She is not toxic-appearing.  HENT:     Head: Normocephalic and atraumatic.  Eyes:     General:        Right eye: No discharge.        Left eye: No discharge.     Conjunctiva/sclera: Conjunctivae normal.  Cardiovascular:     Rate and Rhythm: Normal  rate and regular rhythm.  Pulmonary:     Breath sounds: No wheezing.     Comments: Course bibasilar breath sounds. Hypoxic on RA to 85%. Improved to low 90s on 3L via [place].  Abdominal:     General: There is no distension.     Palpations: Abdomen is soft.     Tenderness: There is no abdominal tenderness. There is no guarding or rebound.  Musculoskeletal:     Cervical back: Neck supple.     Right lower leg: No edema.     Left lower leg: No edema.  Skin:    General: Skin is warm and dry.     Findings: No rash.  Neurological:     Mental Status: She is alert.     Comments: Clear speech.   Psychiatric:        Behavior: Behavior normal.     ED Results / Procedures / Treatments   Labs (all labs ordered are listed, but only abnormal results are displayed) Labs Reviewed  CBC WITH DIFFERENTIAL/PLATELET - Abnormal; Notable for the following components:      Result Value   RBC 3.57 (*)    Hemoglobin 10.6 (*)    HCT 32.5 (*)    Neutro Abs 9.0 (*)    Lymphs Abs 0.5 (*)    All other components within normal limits  COMPREHENSIVE METABOLIC PANEL - Abnormal; Notable for the following components:   Glucose, Bld 146 (*)    BUN 25 (*)    Calcium 8.8 (*)    Albumin 3.3 (*)    Alkaline Phosphatase 37  (*)    All other components within normal limits  LACTATE DEHYDROGENASE - Abnormal; Notable for the following components:   LDH 203 (*)    All other components within normal limits  TRIGLYCERIDES - Abnormal; Notable for the following components:   Triglycerides 167 (*)    All other components within normal limits  FIBRINOGEN - Abnormal; Notable for the following components:   Fibrinogen 597 (*)    All other components within normal limits  CULTURE, BLOOD (ROUTINE X 2)  CULTURE, BLOOD (ROUTINE X 2)  D-DIMER, QUANTITATIVE (NOT AT [facility])  PROCALCITONIN  LACTIC ACID, PLASMA  LACTIC ACID, PLASMA  FERRITIN  C-REACTIVE PROTEIN    EKG EKG Interpretation  Date/Time:  Saturday February 19 2019 12:13:50 EST Ventricular Rate:  72 PR Interval:    QRS Duration: 117 QT Interval:  420 QTC Calculation: 460 R Axis:   17 Text Interpretation: Sinus rhythm Nonspecific intraventricular conduction delay Anterior infarct, old Minimal ST depression, lateral leads Confirmed by Fredia Sorrow [phone]) on 02/19/2019 12:37:12 PM   Radiology DG Chest Port 1 View  Result Date: 02/19/2019 CLINICAL DATA:  Cough and shortness of breath. COVID-19 positive. Decreased oxygen saturation EXAM: PORTABLE CHEST 1 VIEW COMPARISON:  May 27, 2008. FINDINGS: There is atelectatic change in the right base. Lungs elsewhere are clear. Heart size and pulmonary vascularity are normal. No adenopathy. Pacemaker leads are attached the right atrium and right ventricle. No adenopathy. There are surgical clips in the right axillary region. IMPRESSION: Atelectatic change right base. Lungs elsewhere clear. Cardiac silhouette within normal limits. Pacemaker leads attached to right atrium and right ventricle. No evident adenopathy. Electronically Signed   By: Lowella Grip III M.D.   On: 02/19/2019 13:30    Procedures .Critical Care Performed by: Amaryllis Dyke, PA-C Authorized by: Amaryllis Dyke, PA-C    CRITICAL  CARE Performed by: Kennith Maes   Total  critical care time: 35 minutes  Critical care time was exclusive of separately billable procedures and treating other patients.  Critical care was necessary to treat or prevent imminent or life-threatening deterioration.  Critical care was time spent personally by me on the following activities: development of treatment plan with patient and/or surrogate as well as nursing, discussions with consultants, evaluation of patient's response to treatment, examination of patient, obtaining history from patient or surrogate, ordering and performing treatments and interventions, ordering and review of laboratory studies, ordering and review of radiographic studies, pulse oximetry and re-evaluation of patient's condition.   (including critical care time)  Medications Ordered in ED Medications  dexamethasone (DECADRON) injection 6 mg (6 mg Intravenous Given 02/19/19 1345)  albuterol (VENTOLIN HFA) 108 (90 Base) MCG/ACT inhaler 4 puff (4 puffs Inhalation Given 02/19/19 1348)  AeroChamber Plus Flo-Vu Medium MISC 1 each (1 each Other Given 02/19/19 1348)    ED Course  I have reviewed the triage vital signs and the nursing notes.  Pertinent labs & imaging results that were available during my care of the patient were reviewed by me and considered in my medical decision making (see chart for details).       MDM Rules/Calculators/A&P                      Patient with known positive COVID-19 testing as documented above presents to the emergency department with complaints of low sats on at home pulse oximeter.  On arrival patient is hypoxic on room air to 85 to 86%, improved with application of 3 L via nasal cannula, mild tachypnea, vitals otherwise fairly unremarkable.  She does have some coarse breath sounds at the bases.  Plan for chest x-ray, EKG, and labs.  Will give Decadron and albuterol.  EKG: No STEMI. Chest x-ray: Atelectatic change right base. Lungs  elsewhere clear. Cardiac silhouette within normal limits. Pacemaker leads attached to right atrium and right ventricle. No evident adenopathy CBC: Mild anemia.  White blood cell count and platelets WNL. CMP: No significant electrolyte derangement.  Creatinine and LFTs WNL. Some elevation in inflammatory markers including fibrinogen, LDH, triglycerides, and CRP.  Ferritin, procalcitonin, D-dimer WNL.  Suspect acute hypoxic respiratory failure secondary to COVID-19, with normal D-dimer PE seems less unlikely, no chest pain to raise concern for ACS, no signs of fluid overload to indicate CHF exacerbation.  Will consult hospitalist service for admission.Findings and plan of care discussed with supervising physician Dr. Rogene Houston who has evaluated patient & is in agreement.   I called & updated patient's PCP Dr. Sherrie Sport as well as her daughter in law Syrenity Kuznicki who is a Designer, jewellery [phone]) @ patient's request each via telephone to update them on patient's results & plan of care.   15:45: CONSULT: Discussed with hospitalist Dr. Denton Brick who accepts admission, requesting rapid covid test as to have positive documented in our system. Appreciate consultation.   15:47: Nursing team has just verified several SpO2 values in the upper 80s, however I just assessed the patient prior to placing consult to hospitalist service and she remained >92% on 3L throughout my assessment in exam room.   Final Clinical Impression(s) / ED Diagnoses Final diagnoses:  Acute hypoxemic respiratory failure [facility])  COVID-19    Rx / DC Orders ED Discharge Orders    None       Amaryllis Dyke, PA-C 02/19/19 1547    Fredia Sorrow, MD 02/20/19 [phone]

## 2019-02-19 NOTE — ED Notes (Signed)
Placed patient on 3l ox was 92%

## 2019-02-19 NOTE — ED Notes (Signed)
Pt resting quietly on stretcher .  No distress

## 2019-02-19 NOTE — Progress Notes (Signed)
ANTICOAGULATION CONSULT NOTE - Initial Consult  Pharmacy Consult for Coumadin Indication: Cardioembolic CVA  No Known Allergies  Patient Measurements: Height: 5\' 8"  (172.7 cm) Weight: 160 lb (72.6 kg) IBW/kg (Calculated) : 63.9  Vital Signs: Temp: 99.2 F (37.3 C) (01/09 1203) Temp Source: Oral (01/09 1203) BP: 108/61 (01/09 1601) Pulse Rate: 67 (01/09 1601)  Labs: Recent Labs    02/19/19 1244 02/19/19 1720  HGB 10.6*  --   HCT 32.5*  --   PLT 218  --   LABPROT  --  37.8*  INR  --  3.8*  CREATININE 0.85  --     Estimated Creatinine Clearance: 64.8 mL/min (by C-G formula based on SCr of 0.85 mg/dL).   Medical History: Past Medical History:  Diagnosis Date  . Arthritis    thumbs  . Breast cancer ([place])    right  . Cerebrovascular disease    s/p right MCA cardioemoblic infarct A999333  . CHF (congestive heart failure) ([place])   . Nonischemic cardiomyopathy (HCC)    a. +LMNA mutation b. s/p STJ dual chamber ICD  . Stroke [facility]) 02/2008   no deficits    Medications:  See med rec  Assessment: 69 y.o. female with medical history significant for systolic CHF, AICD status, cardioembolic CVA on anticoagulation.  Patient tested positive for Covid 02/13/2017, she had been feeling ill since 12/28-with fever chills cough , fatigue, dyspnea and diarrhea. Pharmacy asked to dose Coumadin. INR on admission is 3.8  Home dose is 2.5mg  every Mon, Wed, Fri, 5mg  ROW Goal of Therapy:  INR 2-3 Monitor platelets by anticoagulation protocol: Yes   Plan:  No coumadin today PT-INR daily Monitor for S/S of bleeding  Isac Sarna, BS [place], BCPS Clinical Pharmacist Pager [phone] 02/19/2019,6:11 PM

## 2019-02-20 ENCOUNTER — Inpatient Hospital Stay (HOSPITAL_COMMUNITY): Payer: Medicare Other

## 2019-02-20 DIAGNOSIS — Z8673 Personal history of transient ischemic attack (TIA), and cerebral infarction without residual deficits: Secondary | ICD-10-CM

## 2019-02-20 DIAGNOSIS — Z95810 Presence of automatic (implantable) cardiac defibrillator: Secondary | ICD-10-CM

## 2019-02-20 LAB — CBC WITH DIFFERENTIAL/PLATELET
Abs Immature Granulocytes: 0.06 10*3/uL (ref 0.00–0.07)
Basophils Absolute: 0 10*3/uL (ref 0.0–0.1)
Basophils Relative: 0 %
Eosinophils Absolute: 0 10*3/uL (ref 0.0–0.5)
Eosinophils Relative: 0 %
HCT: 34.1 % — ABNORMAL LOW (ref 36.0–46.0)
Hemoglobin: 10.8 g/dL — ABNORMAL LOW (ref 12.0–15.0)
Immature Granulocytes: 1 %
Lymphocytes Relative: 9 %
Lymphs Abs: 0.7 10*3/uL (ref 0.7–4.0)
MCH: 29.6 pg (ref 26.0–34.0)
MCHC: 31.7 g/dL (ref 30.0–36.0)
MCV: 93.4 fL (ref 80.0–100.0)
Monocytes Absolute: 0.7 10*3/uL (ref 0.1–1.0)
Monocytes Relative: 8 %
Neutro Abs: 7 10*3/uL (ref 1.7–7.7)
Neutrophils Relative %: 82 %
Platelets: 222 10*3/uL (ref 150–400)
RBC: 3.65 MIL/uL — ABNORMAL LOW (ref 3.87–5.11)
RDW: 14 % (ref 11.5–15.5)
WBC: 8.6 10*3/uL (ref 4.0–10.5)
nRBC: 0 % (ref 0.0–0.2)

## 2019-02-20 LAB — COMPREHENSIVE METABOLIC PANEL
ALT: 40 U/L (ref 0–44)
AST: 27 U/L (ref 15–41)
Albumin: 3.1 g/dL — ABNORMAL LOW (ref 3.5–5.0)
Alkaline Phosphatase: 36 U/L — ABNORMAL LOW (ref 38–126)
Anion gap: 13 (ref 5–15)
BUN: 28 mg/dL — ABNORMAL HIGH (ref 8–23)
CO2: 24 mmol/L (ref 22–32)
Calcium: 8.8 mg/dL — ABNORMAL LOW (ref 8.9–10.3)
Chloride: 102 mmol/L (ref 98–111)
Creatinine, Ser: 0.75 mg/dL (ref 0.44–1.00)
GFR calc Af Amer: >60 mL/min (ref >60)
GFR calc non Af Amer: >60 mL/min (ref >60)
Glucose, Bld: 126 mg/dL — ABNORMAL HIGH (ref 70–99)
Potassium: 3.8 mmol/L (ref 3.5–5.1)
Sodium: 139 mmol/L (ref 135–145)
Total Bilirubin: 0.7 mg/dL (ref 0.3–1.2)
Total Protein: 6.4 g/dL — ABNORMAL LOW (ref 6.5–8.1)

## 2019-02-20 LAB — FERRITIN: Ferritin: 317 ng/mL — ABNORMAL HIGH (ref 11–307)

## 2019-02-20 LAB — PROTIME-INR
INR: 3.6 — ABNORMAL HIGH (ref 0.8–1.2)
Prothrombin Time: 35.7 seconds — ABNORMAL HIGH (ref 11.4–15.2)

## 2019-02-20 LAB — ABO/RH: ABO/RH(D): O POS

## 2019-02-20 LAB — D-DIMER, QUANTITATIVE: D-Dimer, Quant: <0.27 ug/mL-FEU (ref 0.00–0.50)

## 2019-02-20 LAB — GLUCOSE, CAPILLARY: Glucose-Capillary: 121 mg/dL — ABNORMAL HIGH (ref 70–99)

## 2019-02-20 LAB — HIV ANTIBODY (ROUTINE TESTING W REFLEX): HIV Screen 4th Generation wRfx: NONREACTIVE

## 2019-02-20 LAB — C-REACTIVE PROTEIN: CRP: 8.2 mg/dL — ABNORMAL HIGH (ref <1.0)

## 2019-02-20 NOTE — Progress Notes (Signed)
ANTICOAGULATION CONSULT NOTE -  Pharmacy Consult for Coumadin Indication: Cardioembolic CVA  No Known Allergies  Patient Measurements: Height: 5\' 8"  (172.7 cm) Weight: 163 lb 2.3 oz (74 kg) IBW/kg (Calculated) : 63.9  Vital Signs: Temp: 98.1 F (36.7 C) (01/10 0455) Temp Source: Oral (01/10 0455) BP: 105/57 (01/10 0952) Pulse Rate: 68 (01/10 0952)  Labs: Recent Labs    02/19/19 1244 02/19/19 1720 02/20/19 0717  HGB 10.6*  --  10.8*  HCT 32.5*  --  34.1*  PLT 218  --  222  LABPROT  --  37.8* 35.7*  INR  --  3.8* 3.6*  CREATININE 0.85  --  0.75    Estimated Creatinine Clearance: 68.8 mL/min (by C-G formula based on SCr of 0.75 mg/dL).   Medical History: Past Medical History:  Diagnosis Date  . Arthritis    thumbs  . Breast cancer ([place])    right  . Cerebrovascular disease    s/p right MCA cardioemoblic infarct [id number]  . CHF (congestive heart failure) ([place])   . Nonischemic cardiomyopathy (HCC)    a. +LMNA mutation b. s/p STJ dual chamber ICD  . Stroke [facility]) 02/2008   no deficits    Medications:  See med rec  Assessment: 69 y.o. female with medical history significant for systolic CHF, AICD status, cardioembolic CVA on anticoagulation.  Patient tested positive for Covid 02/13/2017, she had been feeling ill since 12/28-with fever chills cough , fatigue, dyspnea and diarrhea. Pharmacy asked to dose Coumadin. INR remains supratherapeutic 3.6  Home dose is 2.5mg  every Mon, Wed, Fri, 5mg  ROW Goal of Therapy:  INR 2-3 Monitor platelets by anticoagulation protocol: Yes   Plan:  No coumadin today PT-INR daily Monitor for S/S of bleeding  Isac Sarna, BS [place], BCPS Clinical Pharmacist Pager [phone] 02/20/2019,10:46 AM

## 2019-02-20 NOTE — Progress Notes (Signed)
PROGRESS NOTE    [name] [name]  [id number] DOB: 04/09/1950 DOA: 02/19/2019 PCP: Neale Burly, MD     Brief Narrative:  69 y.o. female with medical history significant for systolic CHF, AICD status, cardioembolic CVA on anticoagulation.  Patient tested positive for Covid 02/13/2017, she had been feeling ill since 12/28-with fever chills cough , fatigue, dyspnea and diarrhea.  Patient reports increasing cough, and that her O2 sats was 79% at home.  She is not on home oxygen.  She denies lower extremity swelling.  No chest pain.  She reports compliance with her warfarin to she has not had her INR checked in at least 6 months due to Covid pandemic.  ED Course: Sats down to 86%, blood pressure systolic 10 8-1 Q000111Q, temperature 99.2.  Elevation in some inflammatory markers LDH, fibrinogen, and CRP.  Procalcitonin and D-dimer unremarkable.  Chest x-ray showed atelectasis changes in the right lung base.  WBC 10.1.  Dexamethasone 6 mg and albuterol inhaler given in ED.   Assessment & Plan: 1-acute respiratory failure ([place]) secondary to COVID-19 pneumonia -Coursing day #2 of IV steroids and remdesivir -Reports some improvement -Still short of breath on exertion requiring oxygen supplementation (2.5 L through nasal cannula) -Continue to follow inflammatory markers -Continue as needed bronchodilators and antitussives will be provided -Instructed to use incentive spirometer to assist with lung exercises. -Follow clinical response. -Continue vitamin C and zinc  2-SYSTOLIC HEART FAILURE, CHRONIC -Currently euvolemic -Continue metoprolol, digoxin, spironolactone and Entresto -Will continue holding home as needed Lasix. -Follow daily weights and low-sodium diet. -Most recent EF demonstrating 45 to 50%.  3-ICD (implantable cardioverter-defibrillator), dual, in situ -Appears to be stable -Continue outpatient follow-up with cardiology service -Most recent ejection fraction demonstrated EF 45  to 50%.  4-History of cardioembolic cerebrovascular accident (CVA) on anticoagulation. -According to documentation in the setting of apical thrombus -No new neurologic deficit appreciated -Continue to follow use of Coumadin as per pharmacy dosing -INR 3.6 today.   DVT prophylaxis: Chronically on Coumadin. Code Status: Full code Family Communication: No family at bedside. Disposition Plan: Remains inpatient, continue IV steroids, continue IV remdesivir; as needed antitussives, incentive spirometer and wean off oxygen supplementation as tolerated.  Consultants:   None  Procedures:   See below for x-ray reports  Antimicrobials:  Anti-infectives (From admission, onward)   Start     Dose/Rate Route Frequency Ordered Stop   02/20/19 1000  remdesivir 100 mg in sodium chloride 0.9 % 100 mL IVPB     100 mg 200 mL/hr over 30 Minutes Intravenous Daily 02/19/19 1807 02/24/19 0959   02/19/19 1845  remdesivir 200 mg in sodium chloride 0.9% 250 mL IVPB     200 mg 580 mL/hr over 30 Minutes Intravenous Once 02/19/19 1807 02/19/19 1941      Subjective: No chest pain, no nausea, no vomiting; patient is afebrile reported improvement in her symptoms.  Still short of breath on exertion and requiring 2.5 L of oxygen supplementation.  Objective: Vitals:   02/19/19 2101 02/19/19 2104 02/20/19 0455 02/20/19 0952  BP: 115/69  116/63 (!) 105/57  Pulse: 63  (!) 56 68  Resp:   16   Temp: 98.2 F (36.8 C)  98.1 F (36.7 C)   TempSrc: Oral  Oral   SpO2: 96% 96% 91%   Weight: 74 kg     Height: 5\' 8"  (1.727 m)       Intake/Output Summary (Last 24 hours) at 02/20/2019 1657 Last data filed at 02/20/2019  1550 Gross per 24 hour  Intake 200 ml  Output --  Net 200 ml   Filed Weights   02/19/19 1203 02/19/19 2101  Weight: 72.6 kg 74 kg    Examination: General exam: Alert, awake, oriented x 3; using 2.5 L nasal cannula supplementation; reporting feeling short of breath on exertion.  No nausea,  no vomiting, no fever. Respiratory system: Positive rhonchi bilaterally; no using accessory muscles.  Fair air movement appreciated and decreased breath sounds at the bases.  Normal respiratory effort at rest. Cardiovascular system:RRR. No murmurs, rubs, gallops. Gastrointestinal system: Abdomen is nondistended, soft and nontender. No organomegaly or masses felt. Normal bowel sounds heard. Central nervous system: Alert and oriented. No focal neurological deficits. Extremities: No C/C/E, +pedal pulses Skin: No rashes, lesions or ulcers Psychiatry: Judgement and insight appear normal. Mood & affect appropriate.     Data Reviewed: I have personally reviewed following labs and imaging studies  CBC: Recent Labs  Lab 02/19/19 1244 02/20/19 0717  WBC 10.1 8.6  NEUTROABS 9.0* 7.0  HGB 10.6* 10.8*  HCT 32.5* 34.1*  MCV 91.0 93.4  PLT 218 [id number]   Basic Metabolic Panel: Recent Labs  Lab 02/19/19 1244 02/20/19 0717  NA 136 139  K 3.7 3.8  CL 102 102  CO2 24 24  GLUCOSE 146* 126*  BUN 25* 28*  CREATININE 0.85 0.75  CALCIUM 8.8* 8.8*   GFR: Estimated Creatinine Clearance: 68.8 mL/min (by C-G formula based on SCr of 0.75 mg/dL).   Liver Function Tests: Recent Labs  Lab 02/19/19 1244 02/20/19 0717  AST 38 27  ALT 43 40  ALKPHOS 37* 36*  BILITOT 0.8 0.7  PROT 6.8 6.4*  ALBUMIN 3.3* 3.1*   Coagulation Profile: Recent Labs  Lab 02/19/19 1720 02/20/19 0717  INR 3.8* 3.6*   CBG: Recent Labs  Lab 02/20/19 0717  GLUCAP 121*   Lipid Profile: Recent Labs    02/19/19 1244  TRIG 167*   Anemia Panel: Recent Labs    02/19/19 1244 02/20/19 0717  FERRITIN 258 317*   Urine analysis:    Component Value Date/Time   COLORURINE ORANGE BIOCHEMICALS MAY BE AFFECTED BY COLOR (A) 03/11/2008 0934   APPEARANCEUR TURBID (A) 03/11/2008 0934   LABSPEC 1.026 03/11/2008 0934   PHURINE 6.0 03/11/2008 0934   GLUCOSEU NEGATIVE 03/11/2008 0934   HGBUR LARGE (A) 03/11/2008 0934    BILIRUBINUR SMALL (A) 03/11/2008 0934   KETONESUR 15 (A) 03/11/2008 0934   PROTEINUR >300 (A) 03/11/2008 0934   UROBILINOGEN 1.0 03/11/2008 0934   NITRITE NEGATIVE 03/11/2008 0934   LEUKOCYTESUR LARGE (A) 03/11/2008 0934    Recent Results (from the past 240 hour(s))  Blood Culture (routine x 2)     Status: None (Preliminary result)   Collection Time: 02/19/19 12:44 PM   Specimen: Blood  Result Value Ref Range Status   Specimen Description   Final    BLOOD RIGHT HAND BOTTLES DRAWN AEROBIC AND ANAEROBIC   Special Requests   Final    Blood Culture adequate volume Performed at [facility], [address] [place], [place] [address]    Culture PENDING  Incomplete   Report Status PENDING  Incomplete  Blood Culture (routine x 2)     Status: None (Preliminary result)   Collection Time: 02/19/19 12:49 PM   Specimen: Blood  Result Value Ref Range Status   Specimen Description   Final    RIGHT ANTECUBITAL BOTTLES DRAWN AEROBIC AND ANAEROBIC   Special Requests  Final    Blood Culture adequate volume Performed at [facility], [address] [place], [place] [address]    Culture PENDING  Incomplete   Report Status PENDING  Incomplete     Radiology Studies: Portable chest 1 View  Result Date: 02/20/2019 CLINICAL DATA:  Shortness of breath. COVID-19 positive. EXAM: PORTABLE CHEST 1 VIEW COMPARISON:  02/19/2019 FINDINGS: An ICD remains in place with the tip of the right atrial lead projecting more laterally over the right heart border compared to yesterday's study and with the right ventricular lead appearing unchanged. The cardiac silhouette is borderline enlarged. Surgical clips are noted in the right axilla. There is new/increasing ground-glass opacity in the right mid lung and lateral left lung base. Right basilar atelectasis has improved. No pleural effusion or pneumothorax is identified. IMPRESSION: 1. New/increasing opacity in the right mid lung and lateral left lung base most  suggestive of viral pneumonia with this history. 2. Mildly changed appearance of the right atrial ICD leads as above. Electronically Signed   By: Logan Bores M.D.   On: 02/20/2019 07:45   DG Chest Port 1 View  Result Date: 02/19/2019 CLINICAL DATA:  Cough and shortness of breath. COVID-19 positive. Decreased oxygen saturation EXAM: PORTABLE CHEST 1 VIEW COMPARISON:  May 27, 2008. FINDINGS: There is atelectatic change in the right base. Lungs elsewhere are clear. Heart size and pulmonary vascularity are normal. No adenopathy. Pacemaker leads are attached the right atrium and right ventricle. No adenopathy. There are surgical clips in the right axillary region. IMPRESSION: Atelectatic change right base. Lungs elsewhere clear. Cardiac silhouette within normal limits. Pacemaker leads attached to right atrium and right ventricle. No evident adenopathy. Electronically Signed   By: Lowella Grip III M.D.   On: 02/19/2019 13:30    Scheduled Meds: . vitamin C  500 mg Oral Daily  . dexamethasone (DECADRON) injection  6 mg Intravenous Q24H  . digoxin  125 mcg Oral Daily  . guaiFENesin  600 mg Oral BID  . mouth rinse  15 mL Mouth Rinse BID  . metoprolol succinate  50 mg Oral BID  . sacubitril-valsartan  1 tablet Oral BID  . spironolactone  12.5 mg Oral Daily  . zinc sulfate  220 mg Oral Daily   Continuous Infusions: . remdesivir 100 mg in NS 100 mL 100 mg (02/20/19 1054)     LOS: 1 day    Time spent: 35 minutes. Greater than 50% of this time was spent in direct contact with the patient, coordinating care and discussing relevant ongoing clinical issues, including respiratory failure with hypoxia secondary to COVID-19 pneumonia and CHF.  Afebrile and feeling better; continue IV steroids and IV remdesivir.  Start incentive spirometer, follow inflammatory markers and clinical response.   Barton Dubois, MD [facility] Pager [phone]   02/20/2019, 4:57 PM

## 2019-02-21 LAB — PROTIME-INR
INR: 3.4 — ABNORMAL HIGH (ref 0.8–1.2)
Prothrombin Time: 33.9 s — ABNORMAL HIGH (ref 11.4–15.2)

## 2019-02-21 LAB — CBC
HCT: 37.1 % (ref 36.0–46.0)
Hemoglobin: 11.8 g/dL — ABNORMAL LOW (ref 12.0–15.0)
MCH: 29.1 pg (ref 26.0–34.0)
MCHC: 31.8 g/dL (ref 30.0–36.0)
MCV: 91.6 fL (ref 80.0–100.0)
Platelets: 264 10*3/uL (ref 150–400)
RBC: 4.05 MIL/uL (ref 3.87–5.11)
RDW: 13.9 % (ref 11.5–15.5)
WBC: 8.6 10*3/uL (ref 4.0–10.5)
nRBC: 0 % (ref 0.0–0.2)

## 2019-02-21 LAB — CBC WITH DIFFERENTIAL/PLATELET
Abs Immature Granulocytes: 0.05 10*3/uL (ref 0.00–0.07)
Basophils Absolute: 0 10*3/uL (ref 0.0–0.1)
Basophils Relative: 0 %
Eosinophils Absolute: 0 10*3/uL (ref 0.0–0.5)
Eosinophils Relative: 0 %
HCT: 33.8 % — ABNORMAL LOW (ref 36.0–46.0)
Hemoglobin: 10.9 g/dL — ABNORMAL LOW (ref 12.0–15.0)
Immature Granulocytes: 1 %
Lymphocytes Relative: 13 %
Lymphs Abs: 0.9 10*3/uL (ref 0.7–4.0)
MCH: 29.2 pg (ref 26.0–34.0)
MCHC: 32.2 g/dL (ref 30.0–36.0)
MCV: 90.6 fL (ref 80.0–100.0)
Monocytes Absolute: 0.6 10*3/uL (ref 0.1–1.0)
Monocytes Relative: 8 %
Neutro Abs: 5.9 10*3/uL (ref 1.7–7.7)
Neutrophils Relative %: 78 %
Platelets: 229 10*3/uL (ref 150–400)
RBC: 3.73 MIL/uL — ABNORMAL LOW (ref 3.87–5.11)
RDW: 13.7 % (ref 11.5–15.5)
WBC: 7.5 10*3/uL (ref 4.0–10.5)
nRBC: 0 % (ref 0.0–0.2)

## 2019-02-21 LAB — COMPREHENSIVE METABOLIC PANEL
ALT: 45 U/L — ABNORMAL HIGH (ref 0–44)
AST: 27 U/L (ref 15–41)
Albumin: 2.8 g/dL — ABNORMAL LOW (ref 3.5–5.0)
Alkaline Phosphatase: 35 U/L — ABNORMAL LOW (ref 38–126)
Anion gap: 9 (ref 5–15)
BUN: 25 mg/dL — ABNORMAL HIGH (ref 8–23)
CO2: 24 mmol/L (ref 22–32)
Calcium: 8.6 mg/dL — ABNORMAL LOW (ref 8.9–10.3)
Chloride: 106 mmol/L (ref 98–111)
Creatinine, Ser: 0.64 mg/dL (ref 0.44–1.00)
GFR calc Af Amer: >60 mL/min (ref >60)
GFR calc non Af Amer: >60 mL/min (ref >60)
Glucose, Bld: 132 mg/dL — ABNORMAL HIGH (ref 70–99)
Potassium: 3.9 mmol/L (ref 3.5–5.1)
Sodium: 139 mmol/L (ref 135–145)
Total Bilirubin: 0.6 mg/dL (ref 0.3–1.2)
Total Protein: 6.1 g/dL — ABNORMAL LOW (ref 6.5–8.1)

## 2019-02-21 LAB — C-REACTIVE PROTEIN: CRP: 4.7 mg/dL — ABNORMAL HIGH (ref <1.0)

## 2019-02-21 LAB — D-DIMER, QUANTITATIVE: D-Dimer, Quant: 0.28 ug{FEU}/mL (ref 0.00–0.50)

## 2019-02-21 LAB — FERRITIN: Ferritin: 352 ng/mL — ABNORMAL HIGH (ref 11–307)

## 2019-02-21 MED ORDER — TRAZODONE HCL 50 MG PO TABS
50.0000 mg | ORAL_TABLET | Freq: Every evening | ORAL | Status: DC | PRN
  Administered 2019-02-21 – 2019-02-22 (×2): 50 mg via ORAL
  Filled 2019-02-21 (×2): qty 1

## 2019-02-21 NOTE — Progress Notes (Signed)
PROGRESS NOTE    [name] [name]  [id number] DOB: April 10, 1950 DOA: 02/19/2019 PCP: Neale Burly, MD     Brief Narrative:  69 y.o. female with medical history significant for systolic CHF, AICD status, cardioembolic CVA on anticoagulation.  Patient tested positive for Covid 02/13/2017, she had been feeling ill since 12/28-with fever chills cough , fatigue, dyspnea and diarrhea.  Patient reports increasing cough, and that her O2 sats was 79% at home.  She is not on home oxygen.  She denies lower extremity swelling.  No chest pain.  She reports compliance with her warfarin to she has not had her INR checked in at least 6 months due to Covid pandemic.  ED Course: Sats down to 86%, blood pressure systolic 10 8-1 Q000111Q, temperature 99.2.  Elevation in some inflammatory markers LDH, fibrinogen, and CRP.  Procalcitonin and D-dimer unremarkable.  Chest x-ray showed atelectasis changes in the right lung base.  WBC 10.1.  Dexamethasone 6 mg and albuterol inhaler given in ED.   Assessment & Plan: 1-acute respiratory failure ([place]) secondary to COVID-19 pneumonia -Coursing day #3 of IV steroids and remdesivir -Reports some improvement -Still short of breath on exertion requiring oxygen supplementation (2 L through nasal cannula) -Continue to follow inflammatory markers -Continue as needed bronchodilators and antitussives will be provided -Instructed to use incentive spirometer to assist with lung exercises. -Follow clinical response. -Continue vitamin C and zinc  2-SYSTOLIC HEART FAILURE, CHRONIC -Currently euvolemic and is stable. -Continue metoprolol, digoxin, spironolactone and Entresto -Will continue holding home as needed Lasix. -Follow daily weights and low-sodium diet. -Most recent EF demonstrating 45 to 50%.  3-ICD (implantable cardioverter-defibrillator), dual, in situ -Appears to be stable -Continue outpatient follow-up with cardiology service -Most recent ejection fraction  demonstrated EF 45 to 50%.  4-History of cardioembolic cerebrovascular accident (CVA) on anticoagulation. -According to documentation in the setting of apical thrombus -No new neurologic deficit appreciated -Continue to follow use of Coumadin as per pharmacy dosing -INR 3.2 today.  Goal for INR 2-3.   DVT prophylaxis: Chronically on Coumadin. Code Status: Full code Family Communication: No family at bedside. Disposition Plan: Remains inpatient, continue IV steroids, continue IV remdesivir; as needed antitussives, incentive spirometer and wean off oxygen supplementation as tolerated.  Consultants:   None  Procedures:   See below for x-ray reports  Antimicrobials:  Anti-infectives (From admission, onward)   Start     Dose/Rate Route Frequency Ordered Stop   02/20/19 1000  remdesivir 100 mg in sodium chloride 0.9 % 100 mL IVPB     100 mg 200 mL/hr over 30 Minutes Intravenous Daily 02/19/19 1807 02/24/19 0959   02/19/19 1845  remdesivir 200 mg in sodium chloride 0.9% 250 mL IVPB     200 mg 580 mL/hr over 30 Minutes Intravenous Once 02/19/19 1807 02/19/19 1941      Subjective: No chest pain, no nausea, no vomiting; patient is afebrile and expressing feeling better.  Still requiring 2 L oxygen supplementation and expressed feeling slightly dizzy, easily winded and short of breath with exertion.  Objective: Vitals:   02/20/19 2134 02/21/19 0511 02/21/19 0945 02/21/19 1330  BP: 107/62 114/65 (!) 92/50 (!) 95/52  Pulse: (!) 53 (!) 53 61 (!) 59  Resp: 18 16 16 18   Temp: 98.4 F (36.9 C) 98.1 F (36.7 C) (!) 97.4 F (36.3 C) (!) 97.4 F (36.3 C)  TempSrc: Oral Oral Oral Oral  SpO2: 95% 95% 96% 96%  Weight:  73 kg  Height:        Intake/Output Summary (Last 24 hours) at 02/21/2019 1633 Last data filed at 02/21/2019 1300 Gross per 24 hour  Intake 960 ml  Output --  Net 960 ml   Filed Weights   02/19/19 1203 02/19/19 2101 02/21/19 0511  Weight: 72.6 kg 74 kg 73 kg     Examination: General exam: Alert, awake, oriented x 3; using 2 L nasal cannula supplementation with good oxygen saturation.  Reports feeling slightly dizzy and short of breath on exertion.  No chest pain, no nausea, no vomiting. Respiratory system: Decreased breath sounds at the bases; fair air movement bilaterally.  No wheezing.  Normal respiratory effort.  Cardiovascular system:RRR. No murmurs, rubs, gallops. Gastrointestinal system: Abdomen is nondistended, soft and nontender. No organomegaly or masses felt. Normal bowel sounds heard. Central nervous system: Alert and oriented. No focal neurological deficits. Extremities: No C/C/E, +pedal pulses Skin: No rashes, lesions or ulcers Psychiatry: Judgement and insight appear normal. Mood & affect appropriate.    Data Reviewed: I have personally reviewed following labs and imaging studies  CBC: Recent Labs  Lab 02/19/19 1244 02/20/19 0717 02/21/19 0448  WBC 10.1 8.6 7.5  NEUTROABS 9.0* 7.0 5.9  HGB 10.6* 10.8* 10.9*  HCT 32.5* 34.1* 33.8*  MCV 91.0 93.4 90.6  PLT 218 222 Q000111Q   Basic Metabolic Panel: Recent Labs  Lab 02/19/19 1244 02/20/19 0717 02/21/19 0448  NA 136 139 139  K 3.7 3.8 3.9  CL 102 102 106  CO2 24 24 24   GLUCOSE 146* 126* 132*  BUN 25* 28* 25*  CREATININE 0.85 0.75 0.64  CALCIUM 8.8* 8.8* 8.6*   GFR: Estimated Creatinine Clearance: 68.8 mL/min (by C-G formula based on SCr of 0.64 mg/dL).   Liver Function Tests: Recent Labs  Lab 02/19/19 1244 02/20/19 0717 02/21/19 0448  AST 38 27 27  ALT 43 40 45*  ALKPHOS 37* 36* 35*  BILITOT 0.8 0.7 0.6  PROT 6.8 6.4* 6.1*  ALBUMIN 3.3* 3.1* 2.8*   Coagulation Profile: Recent Labs  Lab 02/19/19 1720 02/20/19 0717 02/21/19 0448  INR 3.8* 3.6* 3.4*   CBG: Recent Labs  Lab 02/20/19 0717  GLUCAP 121*   Lipid Profile: Recent Labs    02/19/19 1244  TRIG 167*   Anemia Panel: Recent Labs    02/20/19 0717 02/21/19 0448  FERRITIN 317* 352*    Urine analysis:    Component Value Date/Time   COLORURINE ORANGE BIOCHEMICALS MAY BE AFFECTED BY COLOR (A) 03/11/2008 0934   APPEARANCEUR TURBID (A) 03/11/2008 0934   LABSPEC 1.026 03/11/2008 0934   PHURINE 6.0 03/11/2008 0934   GLUCOSEU NEGATIVE 03/11/2008 0934   HGBUR LARGE (A) 03/11/2008 0934   BILIRUBINUR SMALL (A) 03/11/2008 0934   KETONESUR 15 (A) 03/11/2008 0934   PROTEINUR >300 (A) 03/11/2008 0934   UROBILINOGEN 1.0 03/11/2008 0934   NITRITE NEGATIVE 03/11/2008 0934   LEUKOCYTESUR LARGE (A) 03/11/2008 0934    Recent Results (from the past 240 hour(s))  Blood Culture (routine x 2)     Status: None (Preliminary result)   Collection Time: 02/19/19 12:44 PM   Specimen: BLOOD RIGHT HAND  Result Value Ref Range Status   Specimen Description   Final    BLOOD RIGHT HAND BOTTLES DRAWN AEROBIC AND ANAEROBIC   Special Requests Blood Culture adequate volume  Final   Culture   Final    NO GROWTH 2 DAYS Performed at [facility], [address], [place], [place] [address]  Report Status PENDING  Incomplete  Blood Culture (routine x 2)     Status: None (Preliminary result)   Collection Time: 02/19/19 12:49 PM   Specimen: Right Antecubital; Blood  Result Value Ref Range Status   Specimen Description   Final    RIGHT ANTECUBITAL BOTTLES DRAWN AEROBIC AND ANAEROBIC   Special Requests Blood Culture adequate volume  Final   Culture   Final    NO GROWTH 2 DAYS Performed at [facility], [address] [place], [place] [address]    Report Status PENDING  Incomplete     Radiology Studies: Portable chest 1 View  Result Date: 02/20/2019 CLINICAL DATA:  Shortness of breath. COVID-19 positive. EXAM: PORTABLE CHEST 1 VIEW COMPARISON:  02/19/2019 FINDINGS: An ICD remains in place with the tip of the right atrial lead projecting more laterally over the right heart border compared to yesterday's study and with the right ventricular lead appearing unchanged. The cardiac silhouette is  borderline enlarged. Surgical clips are noted in the right axilla. There is new/increasing ground-glass opacity in the right mid lung and lateral left lung base. Right basilar atelectasis has improved. No pleural effusion or pneumothorax is identified. IMPRESSION: 1. New/increasing opacity in the right mid lung and lateral left lung base most suggestive of viral pneumonia with this history. 2. Mildly changed appearance of the right atrial ICD leads as above. Electronically Signed   By: Logan Bores M.D.   On: 02/20/2019 07:45    Scheduled Meds: . vitamin C  500 mg Oral Daily  . dexamethasone (DECADRON) injection  6 mg Intravenous Q24H  . digoxin  125 mcg Oral Daily  . mouth rinse  15 mL Mouth Rinse BID  . metoprolol succinate  50 mg Oral BID  . sacubitril-valsartan  1 tablet Oral BID  . spironolactone  12.5 mg Oral Daily  . zinc sulfate  220 mg Oral Daily   Continuous Infusions: . remdesivir 100 mg in NS 100 mL 100 mg (02/21/19 0955)     LOS: 2 days    Time spent: 35 minutes.   Barton Dubois, MD [facility] Pager [phone]   02/21/2019, 4:33 PM

## 2019-02-21 NOTE — Plan of Care (Signed)

## 2019-02-21 NOTE — Progress Notes (Signed)
ANTICOAGULATION CONSULT NOTE -  Pharmacy Consult for Coumadin Indication: Cardioembolic CVA  No Known Allergies  Patient Measurements: Height: 5\' 8"  (172.7 cm) Weight: 160 lb 15 oz (73 kg) IBW/kg (Calculated) : 63.9  Vital Signs: Temp: 97.4 F (36.3 C) (01/11 0945) Temp Source: Oral (01/11 0945) BP: 92/50 (01/11 0945) Pulse Rate: 61 (01/11 0945)  Labs: Recent Labs    02/19/19 1244 02/19/19 1720 02/20/19 0717 02/21/19 0448  HGB 10.6*  --  10.8* 10.9*  HCT 32.5*  --  34.1* 33.8*  PLT 218  --  222 229  LABPROT  --  37.8* 35.7* 33.9*  INR  --  3.8* 3.6* 3.4*  CREATININE 0.85  --  0.75 0.64    Estimated Creatinine Clearance: 68.8 mL/min (by C-G formula based on SCr of 0.64 mg/dL).   Medical History: Past Medical History:  Diagnosis Date  . Arthritis    thumbs  . Breast cancer ([place])    right  . Cerebrovascular disease    s/p right MCA cardioemoblic infarct [id number]  . CHF (congestive heart failure) ([place])   . Nonischemic cardiomyopathy ([facility])    a. +LMNA mutation b. s/p STJ dual chamber ICD  . Stroke [facility]) 02/2008   no deficits    Medications:  See med rec  Assessment: 68 y.o. female with medical history significant for systolic CHF, AICD status, cardioembolic CVA on anticoagulation.  Patient tested positive for Covid 02/13/2017, she had been feeling ill since 12/28-with fever chills cough , fatigue, dyspnea and diarrhea. Pharmacy asked to dose Coumadin. INR remains supratherapeutic 3.4 but trending down  Home dose is 2.5mg  every Mon, Wed, Fri, 5mg  ROW Goal of Therapy:  INR 2-3 Monitor platelets by anticoagulation protocol: Yes   Plan:  No coumadin today PT-INR daily Monitor for S/S of bleeding  Isac Sarna, BS [place], BCPS Clinical Pharmacist Pager [phone] 02/21/2019,11:26 AM

## 2019-02-21 NOTE — Care Management Important Message (Signed)
Important Message  Patient Details  Name: [name] [name] MRN: [id number] Date of Birth: [birth year]   Medicare Important Message Given:  Yes(Lisa, RN will deliver letter to patient due to contact precautions)     Tommy Medal 02/21/2019, 2:51 PM

## 2019-02-22 LAB — D-DIMER, QUANTITATIVE: D-Dimer, Quant: 0.35 ug/mL-FEU (ref 0.00–0.50)

## 2019-02-22 LAB — CBC WITH DIFFERENTIAL/PLATELET
Abs Immature Granulocytes: 0.04 10*3/uL (ref 0.00–0.07)
Basophils Absolute: 0 10*3/uL (ref 0.0–0.1)
Basophils Relative: 0 %
Eosinophils Absolute: 0.1 10*3/uL (ref 0.0–0.5)
Eosinophils Relative: 1 %
HCT: 36.4 % (ref 36.0–46.0)
Hemoglobin: 11.9 g/dL — ABNORMAL LOW (ref 12.0–15.0)
Immature Granulocytes: 0 %
Lymphocytes Relative: 13 %
Lymphs Abs: 1.2 10*3/uL (ref 0.7–4.0)
MCH: 29.4 pg (ref 26.0–34.0)
MCHC: 32.7 g/dL (ref 30.0–36.0)
MCV: 89.9 fL (ref 80.0–100.0)
Monocytes Absolute: 0.6 10*3/uL (ref 0.1–1.0)
Monocytes Relative: 7 %
Neutro Abs: 7.2 10*3/uL (ref 1.7–7.7)
Neutrophils Relative %: 79 %
Platelets: 281 10*3/uL (ref 150–400)
RBC: 4.05 MIL/uL (ref 3.87–5.11)
RDW: 13.8 % (ref 11.5–15.5)
WBC: 9.1 10*3/uL (ref 4.0–10.5)
nRBC: 0 % (ref 0.0–0.2)

## 2019-02-22 LAB — FERRITIN: Ferritin: 327 ng/mL — ABNORMAL HIGH (ref 11–307)

## 2019-02-22 LAB — COMPREHENSIVE METABOLIC PANEL
ALT: 56 U/L — ABNORMAL HIGH (ref 0–44)
AST: 26 U/L (ref 15–41)
Albumin: 2.9 g/dL — ABNORMAL LOW (ref 3.5–5.0)
Alkaline Phosphatase: 37 U/L — ABNORMAL LOW (ref 38–126)
Anion gap: 8 (ref 5–15)
BUN: 22 mg/dL (ref 8–23)
CO2: 22 mmol/L (ref 22–32)
Calcium: 8.6 mg/dL — ABNORMAL LOW (ref 8.9–10.3)
Chloride: 107 mmol/L (ref 98–111)
Creatinine, Ser: 0.62 mg/dL (ref 0.44–1.00)
GFR calc Af Amer: >60 mL/min (ref >60)
GFR calc non Af Amer: >60 mL/min (ref >60)
Glucose, Bld: 121 mg/dL — ABNORMAL HIGH (ref 70–99)
Potassium: 4 mmol/L (ref 3.5–5.1)
Sodium: 137 mmol/L (ref 135–145)
Total Bilirubin: 0.5 mg/dL (ref 0.3–1.2)
Total Protein: 6.2 g/dL — ABNORMAL LOW (ref 6.5–8.1)

## 2019-02-22 LAB — C-REACTIVE PROTEIN: CRP: 2.4 mg/dL — ABNORMAL HIGH (ref <1.0)

## 2019-02-22 LAB — PROTIME-INR
INR: 2.7 — ABNORMAL HIGH (ref 0.8–1.2)
Prothrombin Time: 29 seconds — ABNORMAL HIGH (ref 11.4–15.2)

## 2019-02-22 MED ORDER — WARFARIN SODIUM 2.5 MG PO TABS
2.5000 mg | ORAL_TABLET | Freq: Once | ORAL | Status: AC
  Administered 2019-02-22: 19:00:00 2.5 mg via ORAL
  Filled 2019-02-22: qty 1

## 2019-02-22 MED ORDER — METOPROLOL SUCCINATE ER 25 MG PO TB24
25.0000 mg | ORAL_TABLET | Freq: Two times a day (BID) | ORAL | Status: DC
  Filled 2019-02-22 (×2): qty 1

## 2019-02-22 MED ORDER — WARFARIN - PHARMACIST DOSING INPATIENT: Freq: Every day | Status: DC

## 2019-02-22 NOTE — Progress Notes (Signed)
1500: Pt's SaO2 on 1lpm [place] 96%, resp 16-18/min, denies SOB. O2 decreased to 0.5 lpm [place].  1630: Pt's SaO2 on 0.5lpm [place] at rest 95%, resp 16/min. Ambulated patient around entire hallway (rooms 329 - 340) and back with 0.5 lpm O2. Pt's SaO2 91%, heart rate 72/min. Pt with no complaint of increased SOB, tolerated well. Once back to room, SaO2 up to 94% with rest. 1650: O2 removed. At 1730 will recheck resting SaO2 and then ambulate patient without O2 to evaluate for dyspnea/SOB and tolerance. Pt agreeable to plan.

## 2019-02-22 NOTE — Progress Notes (Signed)
PROGRESS NOTE    [name] [name]  [id number] DOB: 1950/08/12 DOA: 02/19/2019 PCP: Neale Burly, MD     Brief Narrative:  69 y.o. female with medical history significant for systolic CHF, AICD status, cardioembolic CVA on anticoagulation.  Patient tested positive for Covid 02/13/2017, she had been feeling ill since 12/28-with fever chills cough , fatigue, dyspnea and diarrhea.  Patient reports increasing cough, and that her O2 sats was 79% at home.  She is not on home oxygen.  She denies lower extremity swelling.  No chest pain.  She reports compliance with her warfarin to she has not had her INR checked in at least 6 months due to Covid pandemic.  ED Course: Sats down to 86%, blood pressure systolic 10 8-1 [id number], temperature 99.2.  Elevation in some inflammatory markers LDH, fibrinogen, and CRP.  Procalcitonin and D-dimer unremarkable.  Chest x-ray showed atelectasis changes in the right lung base.  WBC 10.1.  Dexamethasone 6 mg and albuterol inhaler given in ED.   Assessment & Plan: 1-acute respiratory failure ([place]) secondary to COVID-19 pneumonia -Coursing day #4 of IV steroids and remdesivir -Reports some improvement -Still short of breath on exertion requiring oxygen supplementation (2 L through nasal cannula) -Continue to follow inflammatory markers -Continue as needed bronchodilators and antitussives will be provided -Instructed to use incentive spirometer to assist with lung exercises. -Follow clinical response. -Continue vitamin C and zinc  2-SYSTOLIC HEART FAILURE, CHRONIC -Currently euvolemic and is stable. -Continue metoprolol (dose adjusted to 25 mg twice daily secondary to bradycardia and low blood pressure), digoxin, spironolactone and Entresto -Will continue holding home as needed Lasix. -Follow daily weights and low-sodium diet. -Most recent EF demonstrating 45 to 50%.  3-ICD (implantable cardioverter-defibrillator), dual, in situ -Appears to be stable -Continue  outpatient follow-up with cardiology service -Most recent ejection fraction demonstrated EF 45 to 50%.  4-History of cardioembolic cerebrovascular accident (CVA) on anticoagulation. -According to documentation in the setting of apical thrombus -No new neurologic deficit appreciated -Continue to follow use of Coumadin as per pharmacy dosing -INR 2.7 today.  Goal for INR 2-3. -Patient will need assistance with confirmation of ability to return to her Coumadin clinic in [facility] for further INR checks and adjustment on anticoagulation after discharge (given positive Covid status).    DVT prophylaxis: Chronically on Coumadin. Code Status: Full code Family Communication: No family at bedside. Disposition Plan: Remains inpatient, continue IV steroids, continue IV remdesivir; as needed antitussives, incentive spirometer and wean off oxygen supplementation as tolerated.  Consultants:   None  Procedures:   See below for x-ray reports  Antimicrobials:  Anti-infectives (From admission, onward)   Start     Dose/Rate Route Frequency Ordered Stop   02/20/19 1000  remdesivir 100 mg in sodium chloride 0.9 % 100 mL IVPB     100 mg 200 mL/hr over 30 Minutes Intravenous Daily 02/19/19 1807 02/24/19 0959   02/19/19 1845  remdesivir 200 mg in sodium chloride 0.9% 250 mL IVPB     200 mg 580 mL/hr over 30 Minutes Intravenous Once 02/19/19 1807 02/19/19 1941      Subjective: No chest pain, no nausea, no vomiting.  Patient is afebrile and overall feeling better.  Still short of breath on exertion, requiring 1 L of oxygen supplementation and expressed feeling slightly dizzy after standing for a while.  Nursing staff has noted decrease in her blood pressure in the mid 90s intermittently.  Objective: Vitals:   02/21/19 2141 02/22/19 0500 02/22/19 0501 02/22/19  1024  BP: 117/61  (!) 91/52 (!) 91/52  Pulse: 65  (!) 54 (!) 57  Resp: 20  18 16   Temp: 98.1 F (36.7 C)  98.2 F (36.8 C) (!) 97.5 F (36.4  C)  TempSrc: Oral  Oral Oral  SpO2: 95%  92% 93%  Weight:  74.7 kg    Height:        Intake/Output Summary (Last 24 hours) at 02/22/2019 1547 Last data filed at 02/22/2019 1300 Gross per 24 hour  Intake 720 ml  Output --  Net 720 ml   Filed Weights   02/19/19 2101 02/21/19 0511 02/22/19 0500  Weight: 74 kg 73 kg 74.7 kg    Examination: General exam: Alert, awake, oriented x 3; still mildly short of breath on exertion, requiring 1 L of oxygen supplementation and reporting feeling dizzy when standing.  No chest pain, no nausea, no vomiting, no palpitations. Respiratory system: No using accessory muscles, no frank crackles, improved air movement bilaterally.  No wheezing; positive rhonchi. Cardiovascular system: RRR. No murmurs, rubs, gallops. Gastrointestinal system: Abdomen is nondistended, soft and nontender. No organomegaly or masses felt. Normal bowel sounds heard. Central nervous system: Alert and oriented. No focal neurological deficits. Extremities: No C/C/E, +pedal pulses Skin: No rashes, lesions or ulcers Psychiatry: Judgement and insight appear normal. Mood & affect appropriate.   Data Reviewed: I have personally reviewed following labs and imaging studies  CBC: Recent Labs  Lab 02/19/19 1244 02/20/19 0717 02/21/19 0448 02/21/19 1828 02/22/19 0548  WBC 10.1 8.6 7.5 8.6 9.1  NEUTROABS 9.0* 7.0 5.9  --  7.2  HGB 10.6* 10.8* 10.9* 11.8* 11.9*  HCT 32.5* 34.1* 33.8* 37.1 36.4  MCV 91.0 93.4 90.6 91.6 89.9  PLT 218 222 229 264 [id number]   Basic Metabolic Panel: Recent Labs  Lab 02/19/19 1244 02/20/19 0717 02/21/19 0448 02/22/19 0548  NA 136 139 139 137  K 3.7 3.8 3.9 4.0  CL 102 102 106 107  CO2 24 24 24 22   GLUCOSE 146* 126* 132* 121*  BUN 25* 28* 25* 22  CREATININE 0.85 0.75 0.64 0.62  CALCIUM 8.8* 8.8* 8.6* 8.6*   GFR: Estimated Creatinine Clearance: 68.8 mL/min (by C-G formula based on SCr of 0.62 mg/dL).   Liver Function Tests: Recent Labs  Lab  02/19/19 1244 02/20/19 0717 02/21/19 0448 02/22/19 0548  AST 38 27 27 26   ALT 43 40 45* 56*  ALKPHOS 37* 36* 35* 37*  BILITOT 0.8 0.7 0.6 0.5  PROT 6.8 6.4* 6.1* 6.2*  ALBUMIN 3.3* 3.1* 2.8* 2.9*   Coagulation Profile: Recent Labs  Lab 02/19/19 1720 02/20/19 0717 02/21/19 0448 02/22/19 0548  INR 3.8* 3.6* 3.4* 2.7*   CBG: Recent Labs  Lab 02/20/19 0717  GLUCAP 121*   Anemia Panel: Recent Labs    02/21/19 0448 02/22/19 0548  FERRITIN 352* 327*   Urine analysis:    Component Value Date/Time   COLORURINE ORANGE BIOCHEMICALS MAY BE AFFECTED BY COLOR (A) 03/11/2008 0934   APPEARANCEUR TURBID (A) 03/11/2008 0934   LABSPEC 1.026 03/11/2008 0934   PHURINE 6.0 03/11/2008 0934   GLUCOSEU NEGATIVE 03/11/2008 0934   HGBUR LARGE (A) 03/11/2008 0934   BILIRUBINUR SMALL (A) 03/11/2008 0934   KETONESUR 15 (A) 03/11/2008 0934   PROTEINUR >300 (A) 03/11/2008 0934   UROBILINOGEN 1.0 03/11/2008 0934   NITRITE NEGATIVE 03/11/2008 0934   LEUKOCYTESUR LARGE (A) 03/11/2008 0934    Recent Results (from the past 240 hour(s))  Blood Culture (routine x  2)     Status: None (Preliminary result)   Collection Time: 02/19/19 12:44 PM   Specimen: BLOOD RIGHT HAND  Result Value Ref Range Status   Specimen Description   Final    BLOOD RIGHT HAND BOTTLES DRAWN AEROBIC AND ANAEROBIC   Special Requests Blood Culture adequate volume  Final   Culture   Final    NO GROWTH 2 DAYS Performed at [facility], [address] [place], [place] [address]    Report Status PENDING  Incomplete  Blood Culture (routine x 2)     Status: None (Preliminary result)   Collection Time: 02/19/19 12:49 PM   Specimen: Right Antecubital; Blood  Result Value Ref Range Status   Specimen Description   Final    RIGHT ANTECUBITAL BOTTLES DRAWN AEROBIC AND ANAEROBIC   Special Requests Blood Culture adequate volume  Final   Culture   Final    NO GROWTH 2 DAYS Performed at [facility], [address]  [place], [place] [address]    Report Status PENDING  Incomplete     Radiology Studies: No results found.  Scheduled Meds: . vitamin C  500 mg Oral Daily  . dexamethasone (DECADRON) injection  6 mg Intravenous Q24H  . digoxin  125 mcg Oral Daily  . mouth rinse  15 mL Mouth Rinse BID  . metoprolol succinate  25 mg Oral BID  . sacubitril-valsartan  1 tablet Oral BID  . spironolactone  12.5 mg Oral Daily  . warfarin  2.5 mg Oral ONCE-1800  . Warfarin - Pharmacist Dosing Inpatient   Does not apply q1800  . zinc sulfate  220 mg Oral Daily   Continuous Infusions: . remdesivir 100 mg in NS 100 mL 100 mg (02/22/19 1019)     LOS: 3 days    Time spent: 35 minutes.   Barton Dubois, MD [facility] Pager [phone]   02/22/2019, 3:47 PM

## 2019-02-22 NOTE — Progress Notes (Signed)
1745: pt's resting SaO2 on room air was 97%, resp 16/min. Pt ambulated entire hallway and back to room on room air, SaO2 dropped to 93% but patient tolerated well, denied any SOB or weakness. Once back to room, SaO2 up to 95% with rest. Again, pt denies any pain, sob or weakness.

## 2019-02-22 NOTE — Plan of Care (Signed)

## 2019-02-22 NOTE — Progress Notes (Signed)
ANTICOAGULATION CONSULT NOTE -  Pharmacy Consult for Coumadin Indication: Cardioembolic CVA  No Known Allergies  Patient Measurements: Height: 5\' 8"  (172.7 cm) Weight: 164 lb 10.9 oz (74.7 kg) IBW/kg (Calculated) : 63.9  Vital Signs: Temp: 98.2 F (36.8 C) (01/12 0501) Temp Source: Oral (01/12 0501) BP: 91/52 (01/12 0501) Pulse Rate: 54 (01/12 0501)  Labs: Recent Labs    02/20/19 0717 02/21/19 0448 02/21/19 1828 02/22/19 0548  HGB 10.8* 10.9* 11.8* 11.9*  HCT 34.1* 33.8* 37.1 36.4  PLT 222 229 264 281  LABPROT 35.7* 33.9*  --  29.0*  INR 3.6* 3.4*  --  2.7*  CREATININE 0.75 0.64  --  0.62    Estimated Creatinine Clearance: 68.8 mL/min (by C-G formula based on SCr of 0.62 mg/dL).   Medical History: Past Medical History:  Diagnosis Date  . Arthritis    thumbs  . Breast cancer ([place])    right  . Cerebrovascular disease    s/p right MCA cardioemoblic infarct [id number]  . CHF (congestive heart failure) ([place])   . Nonischemic cardiomyopathy ([facility])    a. +LMNA mutation b. s/p STJ dual chamber ICD  . Stroke [facility]) 02/2008   no deficits    Medications:  See med rec  Assessment: 69 y.o. female with medical history significant for systolic CHF, AICD status, cardioembolic CVA on anticoagulation.  Patient tested positive for Covid 02/13/2017, she had been feeling ill since 12/28-with fever chills cough , fatigue, dyspnea and diarrhea. Pharmacy asked to dose Coumadin. INR 2.7  Home dose is 2.5mg  every Mon, Wed, Fri, 5mg  ROW Goal of Therapy:  INR 2-3 Monitor platelets by anticoagulation protocol: Yes   Plan:  Warfarin 2.5 mg x 1 dose. PT-INR daily Monitor for S/S of bleeding  Margot Ables, PharmD Clinical Pharmacist 02/22/2019 8:51 AM

## 2019-02-23 DIAGNOSIS — U071 COVID-19: Secondary | ICD-10-CM

## 2019-02-23 HISTORY — DX: COVID-19: U07.1

## 2019-02-23 LAB — CBC
HCT: 37.5 % (ref 36.0–46.0)
Hemoglobin: 12.1 g/dL (ref 12.0–15.0)
MCH: 29.4 pg (ref 26.0–34.0)
MCHC: 32.3 g/dL (ref 30.0–36.0)
MCV: 91 fL (ref 80.0–100.0)
Platelets: 322 10*3/uL (ref 150–400)
RBC: 4.12 MIL/uL (ref 3.87–5.11)
RDW: 13.9 % (ref 11.5–15.5)
WBC: 10.9 10*3/uL — ABNORMAL HIGH (ref 4.0–10.5)
nRBC: 0 % (ref 0.0–0.2)

## 2019-02-23 LAB — COMPREHENSIVE METABOLIC PANEL
ALT: 55 U/L — ABNORMAL HIGH (ref 0–44)
AST: 22 U/L (ref 15–41)
Albumin: 2.8 g/dL — ABNORMAL LOW (ref 3.5–5.0)
Alkaline Phosphatase: 38 U/L (ref 38–126)
Anion gap: 11 (ref 5–15)
BUN: 19 mg/dL (ref 8–23)
CO2: 22 mmol/L (ref 22–32)
Calcium: 8.6 mg/dL — ABNORMAL LOW (ref 8.9–10.3)
Chloride: 105 mmol/L (ref 98–111)
Creatinine, Ser: 0.61 mg/dL (ref 0.44–1.00)
GFR calc Af Amer: >60 mL/min (ref >60)
GFR calc non Af Amer: >60 mL/min (ref >60)
Glucose, Bld: 125 mg/dL — ABNORMAL HIGH (ref 70–99)
Potassium: 3.9 mmol/L (ref 3.5–5.1)
Sodium: 138 mmol/L (ref 135–145)
Total Bilirubin: 0.7 mg/dL (ref 0.3–1.2)
Total Protein: 6.1 g/dL — ABNORMAL LOW (ref 6.5–8.1)

## 2019-02-23 LAB — CBC WITH DIFFERENTIAL/PLATELET
Abs Immature Granulocytes: 0.07 10*3/uL (ref 0.00–0.07)
Basophils Absolute: 0 10*3/uL (ref 0.0–0.1)
Basophils Relative: 0 %
Eosinophils Absolute: 0 10*3/uL (ref 0.0–0.5)
Eosinophils Relative: 0 %
HCT: 36.3 % (ref 36.0–46.0)
Hemoglobin: 11.8 g/dL — ABNORMAL LOW (ref 12.0–15.0)
Immature Granulocytes: 1 %
Lymphocytes Relative: 12 %
Lymphs Abs: 1.1 10*3/uL (ref 0.7–4.0)
MCH: 29.7 pg (ref 26.0–34.0)
MCHC: 32.5 g/dL (ref 30.0–36.0)
MCV: 91.4 fL (ref 80.0–100.0)
Monocytes Absolute: 0.6 10*3/uL (ref 0.1–1.0)
Monocytes Relative: 6 %
Neutro Abs: 7.1 10*3/uL (ref 1.7–7.7)
Neutrophils Relative %: 81 %
Platelets: 239 10*3/uL (ref 150–400)
RBC: 3.97 MIL/uL (ref 3.87–5.11)
RDW: 13.7 % (ref 11.5–15.5)
WBC: 8.8 10*3/uL (ref 4.0–10.5)
nRBC: 0 % (ref 0.0–0.2)

## 2019-02-23 LAB — D-DIMER, QUANTITATIVE: D-Dimer, Quant: 0.27 ug/mL-FEU (ref 0.00–0.50)

## 2019-02-23 LAB — PROTIME-INR
INR: 2.5 — ABNORMAL HIGH (ref 0.8–1.2)
Prothrombin Time: 26.7 seconds — ABNORMAL HIGH (ref 11.4–15.2)

## 2019-02-23 LAB — FERRITIN: Ferritin: 335 ng/mL — ABNORMAL HIGH (ref 11–307)

## 2019-02-23 LAB — C-REACTIVE PROTEIN: CRP: 2.4 mg/dL — ABNORMAL HIGH (ref <1.0)

## 2019-02-23 MED ORDER — TRAZODONE HCL 50 MG PO TABS: 50.0000 mg | ORAL_TABLET | Freq: Every evening | ORAL | 1 refills | Status: DC | PRN

## 2019-02-23 MED ORDER — METOPROLOL SUCCINATE ER 25 MG PO TB24: 12.5000 mg | ORAL_TABLET | Freq: Every day | ORAL | 1 refills | Status: DC

## 2019-02-23 MED ORDER — ALBUTEROL SULFATE HFA 108 (90 BASE) MCG/ACT IN AERS: 4.0000 | INHALATION_SPRAY | RESPIRATORY_TRACT | 1 refills | Status: DC | PRN

## 2019-02-23 MED ORDER — ASCORBIC ACID 500 MG PO TABS: 500.0000 mg | ORAL_TABLET | Freq: Every day | ORAL | 2 refills | Status: DC

## 2019-02-23 MED ORDER — ACETAMINOPHEN 325 MG PO TABS: 650.0000 mg | ORAL_TABLET | Freq: Four times a day (QID) | ORAL | 2 refills | Status: DC | PRN

## 2019-02-23 MED ORDER — ONDANSETRON HCL 4 MG PO TABS: 4.0000 mg | ORAL_TABLET | Freq: Four times a day (QID) | ORAL | 0 refills | Status: DC | PRN

## 2019-02-23 MED ORDER — WARFARIN SODIUM 2.5 MG PO TABS
2.5000 mg | ORAL_TABLET | Freq: Once | ORAL | Status: AC
  Administered 2019-02-23: 19:00:00 2.5 mg via ORAL
  Filled 2019-02-23: qty 1

## 2019-02-23 MED ORDER — ENTRESTO 97-103 MG PO TABS: 0.5000 | ORAL_TABLET | Freq: Two times a day (BID) | ORAL | 1 refills | Status: DC

## 2019-02-23 MED ORDER — ZINC SULFATE 220 (50 ZN) MG PO CAPS: 220.0000 mg | ORAL_CAPSULE | Freq: Every day | ORAL | 1 refills | Status: DC

## 2019-02-23 MED ORDER — FUROSEMIDE 40 MG PO TABS: 20.0000 mg | ORAL_TABLET | ORAL | 3 refills | Status: DC | PRN

## 2019-02-23 MED ORDER — ATORVASTATIN CALCIUM 20 MG PO TABS: 20.0000 mg | ORAL_TABLET | Freq: Every day | ORAL | 2 refills | Status: AC

## 2019-02-23 NOTE — Progress Notes (Signed)
Pt discharged via wheelchair to POV with all belongings in her possession.

## 2019-02-23 NOTE — Progress Notes (Signed)
ANTICOAGULATION CONSULT NOTE -  Pharmacy Consult for Coumadin Indication: Cardioembolic CVA  No Known Allergies  Patient Measurements: Height: 5\' 8"  (172.7 cm) Weight: 160 lb (72.6 kg) IBW/kg (Calculated) : 63.9  Vital Signs: Temp: 97.6 F (36.4 C) (01/13 0515) Temp Source: Oral (01/13 0515) BP: 99/60 (01/13 0515) Pulse Rate: 60 (01/13 0515)  Labs: Recent Labs    02/21/19 0448 02/21/19 1828 02/22/19 0548 02/23/19 0448  HGB 10.9* 11.8* 11.9* 11.8*  HCT 33.8* 37.1 36.4 36.3  PLT 229 264 281 239  LABPROT 33.9*  --  29.0* 26.7*  INR 3.4*  --  2.7* 2.5*  CREATININE 0.64  --  0.62 0.61    Estimated Creatinine Clearance: 68.8 mL/min (by C-G formula based on SCr of 0.61 mg/dL).   Medical History: Past Medical History:  Diagnosis Date  . Arthritis    thumbs  . Breast cancer ([place])    right  . Cerebrovascular disease    s/p right MCA cardioemoblic infarct [id number]  . CHF (congestive heart failure) ([place])   . Nonischemic cardiomyopathy ([facility])    a. +LMNA mutation b. s/p STJ dual chamber ICD  . Stroke [facility]) 02/2008   no deficits    Medications:  See med rec  Assessment: 69 y.o. female with medical history significant for systolic CHF, AICD status, cardioembolic CVA on anticoagulation.  Patient tested positive for Covid 02/13/2017, she had been feeling ill since 12/28-with fever chills cough , fatigue, dyspnea and diarrhea. Pharmacy asked to dose Coumadin. INR 2.5, will continue will lower dose since was supratherapeutic  Home dose is 2.5mg  every Mon, Wed, Fri, 5mg  ROW Goal of Therapy:  INR 2-3 Monitor platelets by anticoagulation protocol: Yes   Plan:  Warfarin 2.5 mg x 1 dose. PT-INR daily Monitor for S/S of bleeding  Isac Sarna, BS [place], [place] Clinical Pharmacist Pager [phone] 02/23/2019 8:32 AM

## 2019-02-23 NOTE — Plan of Care (Signed)

## 2019-02-23 NOTE — Care Management Important Message (Signed)
Important Message  Patient Details  Name: [name] [name] MRN: [id number] Date of Birth: 1950/09/06   Medicare Important Message Given:  Yes([name], RN agreed to deliver letter to patient.  Legrand Como (spouse) was contacted at [phone] to be informed of letter.)     Tommy Medal 02/23/2019, 1:12 PM

## 2019-02-23 NOTE — Discharge Instructions (Signed)
1) visiting home nurse will help you check your PT/INR on Friday, 02/24/2018 2) the dose of your Coumadin/warfarin will have to be adjusted after your INR is checked on Friday, 02/24/2018 3)Avoid ibuprofen/Advil/Aleve/Motrin/Goody Powders/Naproxen/BC powders/Meloxicam/Diclofenac/Indomethacin and other Nonsteroidal anti-inflammatory medications as these will make you more likely to bleed and can cause stomach ulcers, can also cause Kidney problems.  4) your blood pressure has been running low so you are heart medications have been reduced significantly 5) please take your heart medications including metoprolol, Entresto, Lasix, Aldactone only as prescribed not as you did previously 6) follow-up to primary care physician for a virtual/video visit within a week for recheck and reevaluation and adjustment of your medications 7) you are strongly advised to  to isolate for at least 21 days from the date of your diagnoses with COVID-19 infection--please always wear a mask if you have to go outside the house

## 2019-02-23 NOTE — Discharge Summary (Signed)
[name] [name], is a 69 y.o. female  DOB 06-Jun-1950  MRN [id number].  Admission date:  02/19/2019  Admitting Physician  Bethena Roys, MD  Discharge Date:  02/23/2019   Primary MD  Neale Burly, MD  Recommendations for primary care physician for things to follow:   1) visiting home nurse will help you check your PT/INR on Friday, 02/24/2018 2) the dose of your Coumadin/warfarin will have to be adjusted after your INR is checked on Friday, 02/24/2018 3)Avoid ibuprofen/Advil/Aleve/Motrin/Goody Powders/Naproxen/BC powders/Meloxicam/Diclofenac/Indomethacin and other Nonsteroidal anti-inflammatory medications as these will make you more likely to bleed and can cause stomach ulcers, can also cause Kidney problems.  4) your blood pressure has been running low so you are heart medications have been reduced significantly 5) please take your heart medications including metoprolol, Entresto, Lasix, Aldactone only as prescribed not as you did previously 6) follow-up to primary care physician for a virtual/video visit within a week for recheck and reevaluation and adjustment of your medications 7) you are strongly advised to  to isolate for at least 21 days from the date of your diagnoses with COVID-19 infection--please always wear a mask if you have to go outside the house  Admission Diagnosis  Acute respiratory failure ([place]) [J96.00] Acute hypoxemic respiratory failure ([place]) [J96.01] COVID-19 [U07.1]   Discharge Diagnosis  Acute respiratory failure ([place]) [J96.00] Acute hypoxemic respiratory failure ([place]) [J96.01] COVID-19 [U07.1]    Principal Problem:   COVID-19 virus infection Active Problems:   Acute respiratory failure ([place])   Pneumonia due to [id number] virus   SYSTOLIC HEART FAILURE, CHRONIC   ICD (implantable cardioverter-defibrillator), dual, in situ   History of cardioembolic cerebrovascular  accident (CVA) on anticoagulation.      Past Medical History:  Diagnosis Date  . Arthritis    thumbs  . Breast cancer ([place])    right  . Cerebrovascular disease    s/p right MCA cardioemoblic infarct [id number]  . CHF (congestive heart failure) ([place])   . Nonischemic cardiomyopathy (H[facility])    a. +LMNA mutation b. s/p STJ dual chamber ICD  . Stroke [facility]) 02/2008   no deficits    Past Surgical History:  Procedure Laterality Date  . ABDOMINAL HYSTERECTOMY    . BREAST IMPLANT REMOVAL Right 05/31/2015   Procedure: REMOVAL RIGHT  BREAST IMPLANTS;  Surgeon: Crissie Reese, MD;  Location: [place];  Service: Plastics;  Laterality: Right;  . BREAST RECONSTRUCTION Right 1995  . BREAST RECONSTRUCTION Right 05/31/2015   Procedure: RIGHT BREAST RECONSTRUCTION WITH SALINE IMPLANTS ;  Surgeon: Crissie Reese, MD;  Location: [place];  Service: Plastics;  Laterality: Right;  . CARDIAC DEFIBRILLATOR PLACEMENT  2010   STJ dual chamber ICD implanted for NICM by Dr Caryl Comes  . CARDIAC DEFIBRILLATOR PLACEMENT  05/26/2008  . CATARACT EXTRACTION W/PHACO Right 03/19/2018   Procedure: CATARACT EXTRACTION PHACO AND INTRAOCULAR LENS PLACEMENT ([place]);  Surgeon: Baruch Goldmann, MD;  Location: [facility];  Service: Ophthalmology;  Laterality: Right;  CDE: 5.42  . CATARACT EXTRACTION W/PHACO Left 10/01/2018  Procedure: CATARACT EXTRACTION PHACO AND INTRAOCULAR LENS PLACEMENT ([facility]);  Surgeon: Baruch Goldmann, MD;  Location: [facility];  Service: Ophthalmology;  Laterality: Left;  left, CDE: 6.06  . CHOLECYSTECTOMY    . COLONOSCOPY    . EP IMPLANTABLE DEVICE N/A 08/06/2015   Procedure: ICD Generator Changeout;  Surgeon: Deboraha Sprang, MD;  Location: [facility];  Service: Cardiovascular;  Laterality: N/A;  . ESOPHAGOGASTRODUODENOSCOPY    . MASTECTOMY Right   . MASTOPEXY Left 05/31/2015   Procedure: LEFT BREAST MASTOPEXY;  Surgeon: Crissie Reese, MD;  Location: [facility];  Service: Plastics;  Laterality: Left;  . PACEMAKER INSERTION  05/2008   . TONSILLECTOMY    . VARICOSE VEIN SURGERY Left    leg     HPI  from the history and physical done on the day of admission:     Chief Complaint: Hypoxia, COVID positive.  HPI: [name] [name] is a 69 y.o. female with medical history significant for systolic CHF, AICD status, cardioembolic CVA on anticoagulation.  Patient tested positive for Covid 02/13/2017, she had been feeling ill since 12/28-with fever chills cough , fatigue, dyspnea and diarrhea.  Patient reports increasing cough, and that her O2 sats was 79% at home.  She is not on home oxygen.  She denies lower extremity swelling.  No chest pain.  She reports compliance with her warfarin to she has not had her INR checked in at least 6 months due to Covid pandemic.  ED Course: Sats down to 86%, blood pressure systolic 10 8-1 Q000111Q, temperature 99.2.  Elevation in some inflammatory markers LDH, fibrinogen, and CRP.  Procalcitonin and D-dimer unremarkable.  Chest x-ray showed atelectasis changes in the right lung base.  WBC 10.1.  Dexamethasone 6 mg and albuterol inhaler given in ED.  Hospitalist to admit for acute respiratory failure.    Hospital Course:   Brief Narrative:  69 y.o.femalewith medical history significant forsystolic CHF, AICD status, cardioembolic CVA on anticoagulation. Patient tested positive for Covid 02/13/2017, she had been feeling ill since 12/28-with fever chills cough,fatigue,dyspnea and diarrhea. Patient reports increasing cough, and that her O2 sats was 79% at home. She is not on home oxygen. She denies lower extremity swelling. No chest pain. She reports compliance with her warfarin to she has not had her INR checked in at least 6 months due to Covid pandemic.  ED Course:Sats down to 123456 pressure systolic 10 8-1 Q000111Q, temperature 99.2.Elevation in some inflammatory markers LDH, fibrinogen, and CRP. Procalcitonin and D-dimer unremarkable. Chest x-ray showed atelectasis changes in the right  lung base. WBC 10.1. Dexamethasone 6 mg and albuterol inhaler given in ED.   Assessment & Plan: 1-acute respiratory failure ([place]) secondary to COVID-19 pneumonia -Completed 5 days of IV steroids and remdesivir -Continue incentive spirometry, bronchodilators, mucolytics -Continue vitamin C and zinc  2-SYSTOLIC HEART FAILURE, CHRONIC -Currently euvolemic and is stable. -BP has been soft and patient has had bradycardia so cardiac medications have been adjusted -Okay to continue digoxin -Metoprolol to be reduced to 12.5 mg -Entresto reduced to half a tablet -Aldactone reduced to 12.5 mg -Most recent EF demonstrating 45 to 50%.  3-ICD (implantable cardioverter-defibrillator), dual, in situ -Appears to be stable -Continue outpatient follow-up with cardiology service -Most recent ejection fraction demonstrated EF 45 to 50%.  4-History of cardioembolic cerebrovascular accident (CVA) on anticoagulation. -According to documentation in the setting of apical thrombus -No new neurologic deficit appreciated -Continue Coumadin.  Goal for INR 2-3. -Home health RN can check INR  at home   DVT prophylaxis: Chronically on Coumadin. Code Status: Full code Family Communication: No family at bedside. Disposition Plan: -Discharge home with home health services  Consultants:   None  Discharge Condition: stable  Follow UP  Diet and Activity recommendation:  As advised  Discharge Instructions    Discharge Instructions    Call MD for:  difficulty breathing, headache or visual disturbances   Complete by: As directed    Call MD for:  persistant dizziness or light-headedness   Complete by: As directed    Call MD for:  persistant nausea and vomiting   Complete by: As directed    Call MD for:  severe uncontrolled pain   Complete by: As directed    Call MD for:  temperature >100.4   Complete by: As directed    Diet - low sodium heart healthy   Complete by: As directed     Discharge instructions   Complete by: As directed    1) visiting home nurse will help you check your PT/INR on Friday, 02/24/2018 2) the dose of your Coumadin/warfarin will have to be adjusted after your INR is checked on Friday, 02/24/2018 3)Avoid ibuprofen/Advil/Aleve/Motrin/Goody Powders/Naproxen/BC powders/Meloxicam/Diclofenac/Indomethacin and other Nonsteroidal anti-inflammatory medications as these will make you more likely to bleed and can cause stomach ulcers, can also cause Kidney problems.  4) your blood pressure has been running low so you are heart medications have been reduced significantly 5) please take your heart medications including metoprolol, Entresto, Lasix, Aldactone only as prescribed not as you did previously 6) follow-up to primary care physician for a virtual/video visit within a week for recheck and reevaluation and adjustment of your medications 7) you are strongly advised to  to isolate for at least 21 days from the date of your diagnoses with COVID-19 infection--please always wear a mask if you have to go outside the house   Increase activity slowly   Complete by: As directed        Discharge Medications     Allergies as of 02/23/2019   No Known Allergies     Medication List    TAKE these medications   acetaminophen 325 MG tablet Commonly known as: TYLENOL Take 2 tablets (650 mg total) by mouth every 6 (six) hours as needed for mild pain or headache (fever >/= 101).   albuterol 108 (90 Base) MCG/ACT inhaler Commonly known as: VENTOLIN HFA Inhale 4 puffs into the lungs every 4 (four) hours as needed for wheezing or shortness of breath.   ascorbic acid 500 MG tablet Commonly known as: VITAMIN C Take 1 tablet (500 mg total) by mouth daily. Start taking on: February 24, 2019   atorvastatin 20 MG tablet Commonly known as: Lipitor Take 1 tablet (20 mg total) by mouth daily.   BIOTIN PO Take 1 tablet by mouth daily.   digoxin 0.125 MG tablet Commonly  known as: LANOXIN TAKE ONE TABLET BY MOUTH DAILY.   Entresto 97-103 MG Generic drug: sacubitril-valsartan Take 0.5 tablets by mouth 2 (two) times daily. What changed: how much to take   FISH OIL PO Take 1 capsule by mouth daily.   furosemide 40 MG tablet Commonly known as: LASIX Take 0.5 tablets (20 mg total) by mouth as needed for fluid or edema (q24 hrs as needed). What changed:   how much to take  reasons to take this   metoprolol succinate 25 MG 24 hr tablet Commonly known as: TOPROL-XL Take 0.5 tablets (12.5 mg total) by mouth  daily. What changed:   medication strength  how much to take  when to take this   multivitamin with minerals Tabs tablet Take 1 tablet by mouth daily.   ondansetron 4 MG tablet Commonly known as: ZOFRAN Take 1 tablet (4 mg total) by mouth every 6 (six) hours as needed for nausea or vomiting.   spironolactone 25 MG tablet Commonly known as: ALDACTONE Take 0.5 tablets (12.5 mg total) by mouth daily. K+ sparing diuretic: Hyperkalemia may occur with decreased renal function   Systane Complete 0.6 % Soln Generic drug: Propylene Glycol Place 1 drop into both eyes daily as needed (dry eyes).   traZODone 50 MG tablet Commonly known as: DESYREL Take 1 tablet (50 mg total) by mouth at bedtime as needed for sleep.   warfarin 5 MG tablet Commonly known as: COUMADIN Take as directed. If you are unsure how to take this medication, talk to your nurse or doctor. Original instructions: TAKE 1/2 TO 1 TABLET BY MOUTH DAILY AS DIRECTED COUMADIN CLINIC What changed: See the new instructions.   zinc sulfate 220 (50 Zn) MG capsule Take 1 capsule (220 mg total) by mouth daily.       Major procedures and Radiology Reports - PLEASE review detailed and final reports for all details, in brief -   Portable chest 1 View  Result Date: 02/20/2019 CLINICAL DATA:  Shortness of breath. COVID-19 positive. EXAM: PORTABLE CHEST 1 VIEW COMPARISON:  02/19/2019  FINDINGS: An ICD remains in place with the tip of the right atrial lead projecting more laterally over the right heart border compared to yesterday's study and with the right ventricular lead appearing unchanged. The cardiac silhouette is borderline enlarged. Surgical clips are noted in the right axilla. There is new/increasing ground-glass opacity in the right mid lung and lateral left lung base. Right basilar atelectasis has improved. No pleural effusion or pneumothorax is identified. IMPRESSION: 1. New/increasing opacity in the right mid lung and lateral left lung base most suggestive of viral pneumonia with this history. 2. Mildly changed appearance of the right atrial ICD leads as above. Electronically Signed   By: Logan Bores M.D.   On: 02/20/2019 07:45   DG Chest Port 1 View  Result Date: 02/19/2019 CLINICAL DATA:  Cough and shortness of breath. COVID-19 positive. Decreased oxygen saturation EXAM: PORTABLE CHEST 1 VIEW COMPARISON:  May 27, 2008. FINDINGS: There is atelectatic change in the right base. Lungs elsewhere are clear. Heart size and pulmonary vascularity are normal. No adenopathy. Pacemaker leads are attached the right atrium and right ventricle. No adenopathy. There are surgical clips in the right axillary region. IMPRESSION: Atelectatic change right base. Lungs elsewhere clear. Cardiac silhouette within normal limits. Pacemaker leads attached to right atrium and right ventricle. No evident adenopathy. Electronically Signed   By: Lowella Grip III M.D.   On: 02/19/2019 13:30    Micro Results   Recent Results (from the past 240 hour(s))  Blood Culture (routine x 2)     Status: None (Preliminary result)   Collection Time: 02/19/19 12:44 PM   Specimen: BLOOD RIGHT HAND  Result Value Ref Range Status   Specimen Description   Final    BLOOD RIGHT HAND BOTTLES DRAWN AEROBIC AND ANAEROBIC   Special Requests Blood Culture adequate volume  Final   Culture   Final    NO GROWTH 4  DAYS Performed at [facility], [address] [place], [place] [address]    Report Status PENDING  Incomplete  Blood  Culture (routine x 2)     Status: None (Preliminary result)   Collection Time: 02/19/19 12:49 PM   Specimen: Right Antecubital; Blood  Result Value Ref Range Status   Specimen Description   Final    RIGHT ANTECUBITAL BOTTLES DRAWN AEROBIC AND ANAEROBIC   Special Requests Blood Culture adequate volume  Final   Culture   Final    NO GROWTH 4 DAYS Performed at [facility], [address] [place], [place] [address]    Report Status PENDING  Incomplete       Today   Subjective    [name] [name] today has no new concerns, -Cough and shortness of breath improved   Patient has been seen and examined prior to discharge   Objective   Blood pressure 107/64, pulse 68, temperature 97.7 F (36.5 C), temperature source Oral, resp. rate 18, height 5\' 8"  (1.727 m), weight 72.6 kg, SpO2 95 %.   Intake/Output Summary (Last 24 hours) at 02/23/2019 1746 Last data filed at 02/23/2019 1223 Gross per 24 hour  Intake 480 ml  Output --  Net 480 ml    Exam Gen:- Awake Alert, no acute distress , speaking in complete sentences HEENT:- [place].AT, No sclera icterus Neck-Supple Neck,No JVD,.  Lungs-improved air movement bilaterally, no wheezing  CV- S1, S2 normal, regular Abd-  +ve B.Sounds, Abd Soft, No tenderness,    Extremity/Skin:-     good pulses Psych-affect is appropriate, oriented x3 Neuro-no new focal deficits, no tremors    Data Review   CBC w Diff:  Lab Results  Component Value Date   WBC 8.8 02/23/2019   HGB 11.8 (L) 02/23/2019   HCT 36.3 02/23/2019   PLT 239 02/23/2019   LYMPHOPCT 12 02/23/2019   MONOPCT 6 02/23/2019   EOSPCT 0 02/23/2019   BASOPCT 0 02/23/2019    CMP:  Lab Results  Component Value Date   NA 138 02/23/2019   K 3.9 02/23/2019   CL 105 02/23/2019   CO2 22 02/23/2019   BUN 19 02/23/2019   CREATININE 0.61 02/23/2019   CREATININE  0.84 11/02/2015   PROT 6.1 (L) 02/23/2019   ALBUMIN 2.8 (L) 02/23/2019   BILITOT 0.7 02/23/2019   ALKPHOS 38 02/23/2019   AST 22 02/23/2019   ALT 55 (H) 02/23/2019  .   Total Discharge time is about 33 minutes  Roxan Hockey M.D on 02/23/2019 at 5:46 PM  Go to [email or web address] -  for contact info  [facility] - Office  [phone]

## 2019-02-24 ENCOUNTER — Ambulatory Visit (INDEPENDENT_AMBULATORY_CARE_PROVIDER_SITE_OTHER): Payer: No Typology Code available for payment source | Admitting: *Deleted

## 2019-02-24 DIAGNOSIS — I5022 Chronic systolic (congestive) heart failure: Secondary | ICD-10-CM | POA: Diagnosis not present

## 2019-02-24 LAB — CULTURE, BLOOD (ROUTINE X 2)
Culture: NO GROWTH
Culture: NO GROWTH
Special Requests: ADEQUATE
Special Requests: ADEQUATE

## 2019-02-25 ENCOUNTER — Telehealth: Payer: Self-pay | Admitting: Cardiology

## 2019-02-25 LAB — CUP PACEART REMOTE DEVICE CHECK
Battery Remaining Longevity: 65 mo
Battery Remaining Percentage: 68 %
Battery Voltage: 2.95 V
Brady Statistic AP VP Percent: 1 %
Brady Statistic AP VS Percent: 24 %
Brady Statistic AS VP Percent: 1 %
Brady Statistic AS VS Percent: 76 %
Brady Statistic RA Percent Paced: 23 %
Brady Statistic RV Percent Paced: 1 %
Date Time Interrogation Session: 20210114124056
HighPow Impedance: 65 Ohm
HighPow Impedance: 65 Ohm
Implantable Lead Implant Date: 20100416
Implantable Lead Implant Date: 20100416
Implantable Lead Location: 753859
Implantable Lead Location: 753860
Implantable Lead Model: 7122
Implantable Pulse Generator Implant Date: 20170626
Lead Channel Impedance Value: 380 Ohm
Lead Channel Impedance Value: 400 Ohm
Lead Channel Pacing Threshold Amplitude: 0.75 V
Lead Channel Pacing Threshold Amplitude: 1 V
Lead Channel Pacing Threshold Pulse Width: 0.5 ms
Lead Channel Pacing Threshold Pulse Width: 0.5 ms
Lead Channel Sensing Intrinsic Amplitude: 11.7 mV
Lead Channel Sensing Intrinsic Amplitude: 2.2 mV
Lead Channel Setting Pacing Amplitude: 2 V
Lead Channel Setting Pacing Amplitude: 2.5 V
Lead Channel Setting Pacing Pulse Width: 0.5 ms
Lead Channel Setting Sensing Sensitivity: 0.5 mV
Pulse Gen Serial Number: 7370130

## 2019-02-25 NOTE — Telephone Encounter (Signed)
I reviewed the hospital records.  OK to take meds as she was discharged on.  I will review again when I see her in the office.

## 2019-02-25 NOTE — Telephone Encounter (Signed)
Spoke with patient. Patient informed that Dr. Shanon Rosser has reviewed her hospital records. It is okay to take meds as she was discharged on and he will review medications again when he see's her in the office. Patient verbalized understanding.

## 2019-02-25 NOTE — Telephone Encounter (Signed)
New Message:   Pt called and said she was admitted to [facility] on 02-19-19 for [place]. She was told to contact Dr Warren Lacy when she was discharged. They want him to review her records, two of her medicine were changed. They also wanted her to follow up with him in 2 weeks, I made that appt for 03-11-19.w

## 2019-03-01 ENCOUNTER — Other Ambulatory Visit: Payer: Self-pay | Admitting: *Deleted

## 2019-03-01 ENCOUNTER — Telehealth: Payer: Self-pay | Admitting: *Deleted

## 2019-03-01 NOTE — Patient Outreach (Signed)
[facility]) Care Management  03/01/2019  CLIO FALANA 1950-07-31 [id number]   EMMI- GENERAL DISCHARGE RED ON EMMI ALERT Day #1 Date:02/26/2019 Red Alert Reason:Questions/Problems  Outreach #1 RN spoke with pt and introduced [facility] program and services. Inquired on the above EMMI as pt states she was support to received an INR check on 1/15 but the agency did not show. Pt has since then spoke with the [facility] clinic in [place] who is working on resolution to this problem.   No additional issues at the time. Case closed. Pt aware who to follow up with concerning any additional issues.  Raina Mina, RN Care Management Coordinator [place] Office [phone]

## 2019-03-01 NOTE — Telephone Encounter (Signed)
D/C from [facility] on 02/23/2019 and was told that home health would come out and check INR.  Home health has not been out yet, please advise.

## 2019-03-01 NOTE — Telephone Encounter (Signed)
Spoke with patient and reviewed hospital discharge summary.  D/C instructions stated home health nurse would come out to check INR but doesn't look like an agency was arranged.  I contacted [facility] but they do not have staffing to see patient.  Pt's INR at D/C was 2.5  She is not on any new meds other than atorvastatin.  She will be past her 21 day covid restrictions to come to the office on 03/07/19.  Appt made for her INR check on 03/09/19.  She is in agreement and knows to call if she has any issues before then.

## 2019-03-02 ENCOUNTER — Other Ambulatory Visit: Payer: Self-pay | Admitting: *Deleted

## 2019-03-02 NOTE — Patient Outreach (Signed)
[facility]) Care Management  03/02/2019  [name] [name] 07-16-1950 [id number]    EMMI-GENERAL DISCHARGE RED ON EMMI ALERT Day #4 Date:03/01/2019 Red Alert Reason: Question/Concerns  Outreach #1 RN spoke with pt on the above emmi. Pt verified all her issues have been resolved with no additional problems. States the coumadin clinic has arranged to visit next week to check pt's INR and treat accordingly. No additional questions or concerns as issues have been resolved.  Plan: Case will be closed with no additional issues or concerns.  Raina Mina, RN Care Management Coordinator [place] Office [phone]

## 2019-03-09 ENCOUNTER — Ambulatory Visit (INDEPENDENT_AMBULATORY_CARE_PROVIDER_SITE_OTHER): Payer: No Typology Code available for payment source | Admitting: *Deleted

## 2019-03-09 ENCOUNTER — Other Ambulatory Visit: Payer: Self-pay

## 2019-03-09 DIAGNOSIS — I6350 Cerebral infarction due to unspecified occlusion or stenosis of unspecified cerebral artery: Secondary | ICD-10-CM | POA: Diagnosis not present

## 2019-03-09 DIAGNOSIS — Z5181 Encounter for therapeutic drug level monitoring: Secondary | ICD-10-CM

## 2019-03-09 LAB — POCT INR: INR: 5.2 — AB (ref 2.0–3.0)

## 2019-03-09 NOTE — Patient Instructions (Signed)
Hold warfarin x 3 days then decrease dose to 1/2 tablet daily except 1 tablet on Tuesdays and Saturdays Recheck in 1 week

## 2019-03-10 NOTE — Progress Notes (Signed)
Cardiology Office Note   Date:  03/11/2019   ID:  [name] [name], [name] [name] Apr 18, 1950, MRN [id number]  PCP:  Caryl Bis, MD  Cardiologist:   No primary care provider on file.   Chief Complaint  Patient presents with  . Cardiomyopathy     History of Present Illness: DAJON HUTCHERSON is a 69 y.o. female who presents for follow up of cardiomyopathy.  She has a history of a nonischemic cardiomyopathy. Her ejection fraction was at one point in time 15 - 20%. A later ejection fraction was about 40% in 2015.   Most recently the EF was 25% again.   I sent her for a CPX which demonstrated a good VO2 Max.    Since I last saw her she had COVID 19 and she was fairly ill in the hospital.  She did slightly better at home.  She did have some heart rate 115.  Dr. Quillian Quince increased her beta-blocker which have been reduced.   Her [name] was also reduced at the time of her hospitalization.  Her oxygen levels have been going up.  Her blood pressures have been back to baseline.  She is having no presyncope or syncope.  Her breathing she thinks is slowly getting better.  She is having no cough fevers or chills.  He has had no weight gain or edema.  Monitor does demonstrate on her blood pressure occasional irregular heartbeats.  However, typically her heart rate is in the 60s.   Past Medical History:  Diagnosis Date  . Arthritis    thumbs  . Breast cancer ([place])    right  . Cerebrovascular disease    s/p right MCA cardioemoblic infarct A999333  . CHF (congestive heart failure) ([place])   . Nonischemic cardiomyopathy (HCC)    a. +LMNA mutation b. s/p STJ dual chamber ICD  . Stroke [facility]) 02/2008   no deficits    Past Surgical History:  Procedure Laterality Date  . ABDOMINAL HYSTERECTOMY    . BREAST IMPLANT REMOVAL Right 05/31/2015   Procedure: REMOVAL RIGHT  BREAST IMPLANTS;  Surgeon: Crissie Reese, MD;  Location: [place];  Service: Plastics;  Laterality: Right;  . BREAST RECONSTRUCTION Right 1995    . BREAST RECONSTRUCTION Right 05/31/2015   Procedure: RIGHT BREAST RECONSTRUCTION WITH SALINE IMPLANTS ;  Surgeon: Crissie Reese, MD;  Location: [place];  Service: Plastics;  Laterality: Right;  . CARDIAC DEFIBRILLATOR PLACEMENT  2010   STJ dual chamber ICD implanted for NICM by Dr Caryl Comes  . CARDIAC DEFIBRILLATOR PLACEMENT  05/26/2008  . CATARACT EXTRACTION W/PHACO Right 03/19/2018   Procedure: CATARACT EXTRACTION PHACO AND INTRAOCULAR LENS PLACEMENT ([place]);  Surgeon: Baruch Goldmann, MD;  [facility]: [facility];  Service: Ophthalmology;  Laterality: Right;  CDE: 5.42  . CATARACT EXTRACTION W/PHACO Left 10/01/2018   Procedure: CATARACT EXTRACTION PHACO AND INTRAOCULAR LENS PLACEMENT ([facility]);  Surgeon: Baruch Goldmann, MD;  Location: [facility];  Service: Ophthalmology;  Laterality: Left;  left, CDE: 6.06  . CHOLECYSTECTOMY    . COLONOSCOPY    . EP IMPLANTABLE DEVICE N/A 08/06/2015   Procedure: ICD Generator Changeout;  Surgeon: Deboraha Sprang, MD;  Location: [place] [facility];  Service: Cardiovascular;  Laterality: N/A;  . ESOPHAGOGASTRODUODENOSCOPY    . MASTECTOMY Right   . MASTOPEXY Left 05/31/2015   Procedure: LEFT BREAST MASTOPEXY;  Surgeon: Crissie Reese, MD;  Location: [place];  Service: Plastics;  Laterality: Left;  . PACEMAKER INSERTION  05/2008  . TONSILLECTOMY    .  VARICOSE VEIN SURGERY Left    leg     Current Outpatient Medications  Medication Sig Dispense Refill  . acetaminophen (TYLENOL) 325 MG tablet Take 2 tablets (650 mg total) by mouth every 6 (six) hours as needed for mild pain or headache (fever >/= 101). 20 tablet 2  . albuterol (VENTOLIN HFA) 108 (90 Base) MCG/ACT inhaler Inhale 4 puffs into the lungs every 4 (four) hours as needed for wheezing or shortness of breath. 18 g 1  . ascorbic acid (VITAMIN C) 500 MG tablet Take 1 tablet (500 mg total) by mouth daily. 30 tablet 2  . atorvastatin (LIPITOR) 20 MG tablet Take 1 tablet (20 mg total) by mouth daily. 30 tablet 2  . BIOTIN PO Take 1  tablet by mouth daily.    . digoxin (LANOXIN) 0.125 MG tablet TAKE ONE TABLET BY MOUTH DAILY. 90 tablet 2  . furosemide (LASIX) 40 MG tablet Take 0.5 tablets (20 mg total) by mouth as needed for fluid or edema (q24 hrs as needed). 90 tablet 3  . metoprolol succinate (TOPROL-XL) 25 MG 24 hr tablet Take 0.5 tablets (12.5 mg total) by mouth daily. 30 tablet 1  . Multiple Vitamin (MULTIVITAMIN WITH MINERALS) TABS tablet Take 1 tablet by mouth daily.    . Omega-3 Fatty Acids (FISH OIL PO) Take 1 capsule by mouth daily.     . ondansetron (ZOFRAN) 4 MG tablet Take 1 tablet (4 mg total) by mouth every 6 (six) hours as needed for nausea or vomiting. 20 tablet 0  . Propylene Glycol (SYSTANE COMPLETE) 0.6 % SOLN Place 1 drop into both eyes daily as needed (dry eyes).     . sacubitril-valsartan (ENTRESTO) 97-103 MG Take 1 tablet by mouth 2 (two) times daily. 60 tablet 1  . spironolactone (ALDACTONE) 25 MG tablet Take 0.5 tablets (12.5 mg total) by mouth daily. K+ sparing diuretic: Hyperkalemia may occur with decreased renal function 45 tablet 3  . traZODone (DESYREL) 50 MG tablet Take 1 tablet (50 mg total) by mouth at bedtime as needed for sleep. 30 tablet 1  . warfarin (COUMADIN) 5 MG tablet TAKE 1/2 TO 1 TABLET BY MOUTH DAILY AS DIRECTED COUMADIN CLINIC (Patient taking differently: Take 2.5-5 mg by mouth daily. Take 5mg  on Monday, Wednesday, Friday. And 2.5 mg on Sunday, Tuesday, Thursday, Saturday.) 90 tablet 0  . zinc sulfate 220 (50 Zn) MG capsule Take 1 capsule (220 mg total) by mouth daily. 30 capsule 1   No current facility-administered medications for this visit.    Allergies:   Patient has no known allergies.   ROS:  Please see the history of present illness.   Otherwise, review of systems are positive for none.   All other systems are reviewed and negative.    PHYSICAL EXAM: VS:  BP 118/64   Pulse 63   Temp (!) 97.2 F (36.2 C)   Ht 5\' 8"  (1.727 m)   Wt 166 lb 3.2 oz (75.4 kg)   BMI  25.27 kg/m  , BMI Body mass index is 25.27 kg/m. GENERAL:  Well appearing NECK:  No jugular venous distention, waveform within normal limits, carotid upstroke brisk and symmetric, no bruits, no thyromegaly LUNGS:  Clear to auscultation bilaterally CHEST:  ICD pocket well healed.   HEART:  PMI not displaced or sustained,S1 and S2 within normal limits, no S3, no S4, no clicks, no rubs, no murmurs ABD:  Flat, positive bowel sounds normal in frequency in pitch, no bruits, no rebound,  no guarding, no midline pulsatile mass, no hepatomegaly, no splenomegaly EXT:  2 plus pulses throughout, no edema, no cyanosis no clubbing   EKG:  EKG is ordered today. The ekg ordered today demonstrates sinus rhythm, rate 63, axis within normal limits, borderline interventricular conduction delay, no acute ST-T wave changes.   Recent Labs: 02/23/2019: ALT 55; BUN 19; Creatinine, Ser 0.61; Hemoglobin 12.1; Platelets 322; Potassium 3.9; Sodium 138    Lipid Panel    Component Value Date/Time   CHOL  03/10/2008 0500    128        ATP III CLASSIFICATION:  <200     mg/dL   Desirable  200-239  mg/dL   Borderline High  >=240    mg/dL   High          TRIG 167 (H) 02/19/2019 1244   HDL 33 (L) 03/10/2008 0500   CHOLHDL 3.9 03/10/2008 0500   VLDL 13 03/10/2008 0500   LDLCALC  03/10/2008 0500    82        Total Cholesterol/HDL:CHD Risk Coronary Heart Disease Risk Table                     Men   Women  1/2 Average Risk   3.4   3.3  Average Risk       5.0   4.4  2 X Average Risk   9.6   7.1  3 X Average Risk  23.4   11.0        Use the calculated Patient Ratio above and the CHD Risk Table to determine the patient's CHD Risk.        ATP III CLASSIFICATION (LDL):  <100     mg/dL   Optimal  100-129  mg/dL   Near or Above                    Optimal  130-159  mg/dL   Borderline  160-189  mg/dL   High  >190     mg/dL   Very High      Wt Readings from Last 3 Encounters:  03/11/19 166 lb 3.2 oz (75.4 kg)    02/23/19 160 lb (72.6 kg)  10/15/18 165 lb (74.8 kg)      Other studies Reviewed: Additional studies/ records that were reviewed today include: Hospital records. Review of the above records demonstrates:  Please see elsewhere in the note.     ASSESSMENT AND PLAN:  CARDIOMYOPATHY, PRIMARY, DILATED - Today she can increase her Entresto back to 97/103 twice daily.  I will check an echocardiogram in a couple of weeks to see where she is post Covid.   ICD -  She is up-to-date with her most recent home remote evaluation.  CVA -  She was supratherapeutic recently on her Coumadin but had this suggested.   COVID EDUCATION:   She has had COVID as above.  We talked about continued need to wear the mask and socially distance and to consider the vaccine in a few months.  Current medicines are reviewed at length with the patient today.  The patient does not have concerns regarding medicines.  The following changes have been made:  As above  Labs/ tests ordered today include: None  Orders Placed This Encounter  Procedures  . EKG 12-Lead  . ECHOCARDIOGRAM COMPLETE     Disposition:   FU with in 6 months    Signed, Minus Breeding, MD  03/11/2019 1:03 PM  [facility]

## 2019-03-11 ENCOUNTER — Ambulatory Visit: Payer: Medicare Other | Admitting: Cardiology

## 2019-03-11 ENCOUNTER — Other Ambulatory Visit: Payer: Self-pay

## 2019-03-11 ENCOUNTER — Encounter: Payer: Self-pay | Admitting: Cardiology

## 2019-03-11 VITALS — BP 118/64 | HR 63 | Temp 97.2°F | Ht 68.0 in | Wt 166.2 lb

## 2019-03-11 DIAGNOSIS — I429 Cardiomyopathy, unspecified: Secondary | ICD-10-CM

## 2019-03-11 DIAGNOSIS — Z7189 Other specified counseling: Secondary | ICD-10-CM | POA: Diagnosis not present

## 2019-03-11 DIAGNOSIS — I5022 Chronic systolic (congestive) heart failure: Secondary | ICD-10-CM

## 2019-03-11 DIAGNOSIS — Z4502 Encounter for adjustment and management of automatic implantable cardiac defibrillator: Secondary | ICD-10-CM

## 2019-03-11 DIAGNOSIS — I639 Cerebral infarction, unspecified: Secondary | ICD-10-CM | POA: Diagnosis not present

## 2019-03-11 MED ORDER — ENTRESTO 97-103 MG PO TABS: 1.0000 | ORAL_TABLET | Freq: Two times a day (BID) | ORAL | 1 refills | Status: DC

## 2019-03-11 NOTE — Patient Instructions (Signed)
Medication Instructions:  [name] 97-103 one tablet twice a day *If you need a refill on your cardiac medications before your next appointment, please call your pharmacy*  Lab Work: NONE  Testing/Procedures: Your physician has requested that you have an echocardiogram in 2 weeks. Echocardiography is a painless test that uses sound waves to create images of your heart. It provides your doctor with information about the size and shape of your heart and how well your heart's chambers and valves are working. This procedure takes approximately one hour. There are no restrictions for this procedure. [name] [name]   Follow-Up: At [facility], you and your health needs are our priority.  As part of our continuing mission to provide you with exceptional heart care, we have created designated Provider Care Teams.  These Care Teams include your primary Cardiologist (physician) and Advanced Practice Providers (APPs -  Physician Assistants and Nurse Practitioners) who all work together to provide you with the care you need, when you need it.  Your next appointment:   6 month(s)  The format for your next appointment:   In Person  Provider:   Minus Breeding, MD  Other Instructions Alive Cor by Jodelle Red

## 2019-03-14 ENCOUNTER — Other Ambulatory Visit: Payer: Self-pay

## 2019-03-14 ENCOUNTER — Ambulatory Visit (INDEPENDENT_AMBULATORY_CARE_PROVIDER_SITE_OTHER): Payer: Medicare Other | Admitting: *Deleted

## 2019-03-14 DIAGNOSIS — I6350 Cerebral infarction due to unspecified occlusion or stenosis of unspecified cerebral artery: Secondary | ICD-10-CM | POA: Diagnosis not present

## 2019-03-14 DIAGNOSIS — Z5181 Encounter for therapeutic drug level monitoring: Secondary | ICD-10-CM

## 2019-03-14 LAB — POCT INR: INR: 2.3 (ref 2.0–3.0)

## 2019-03-14 NOTE — Patient Instructions (Signed)
Start warfarin 1/2 tablet daily except 1 tablet on Mondays, Wednesdays and Fridays Recheck in 1 week

## 2019-03-22 ENCOUNTER — Other Ambulatory Visit: Payer: Self-pay

## 2019-03-22 ENCOUNTER — Ambulatory Visit (HOSPITAL_COMMUNITY): Payer: Medicare Other | Attending: Cardiology

## 2019-03-22 ENCOUNTER — Other Ambulatory Visit: Payer: Self-pay | Admitting: Cardiology

## 2019-03-22 DIAGNOSIS — Z8673 Personal history of transient ischemic attack (TIA), and cerebral infarction without residual deficits: Secondary | ICD-10-CM | POA: Diagnosis not present

## 2019-03-22 DIAGNOSIS — Z95810 Presence of automatic (implantable) cardiac defibrillator: Secondary | ICD-10-CM | POA: Insufficient documentation

## 2019-03-22 DIAGNOSIS — I429 Cardiomyopathy, unspecified: Secondary | ICD-10-CM

## 2019-03-22 DIAGNOSIS — I428 Other cardiomyopathies: Secondary | ICD-10-CM | POA: Diagnosis not present

## 2019-03-22 DIAGNOSIS — I509 Heart failure, unspecified: Secondary | ICD-10-CM | POA: Insufficient documentation

## 2019-03-22 DIAGNOSIS — Z8616 Personal history of COVID-19: Secondary | ICD-10-CM | POA: Diagnosis not present

## 2019-03-24 ENCOUNTER — Other Ambulatory Visit: Payer: Self-pay

## 2019-03-24 ENCOUNTER — Ambulatory Visit (INDEPENDENT_AMBULATORY_CARE_PROVIDER_SITE_OTHER): Payer: Medicare Other | Admitting: *Deleted

## 2019-03-24 DIAGNOSIS — Z5181 Encounter for therapeutic drug level monitoring: Secondary | ICD-10-CM | POA: Diagnosis not present

## 2019-03-24 DIAGNOSIS — I6350 Cerebral infarction due to unspecified occlusion or stenosis of unspecified cerebral artery: Secondary | ICD-10-CM

## 2019-03-24 LAB — POCT INR: INR: 3.6 — AB (ref 2.0–3.0)

## 2019-03-24 NOTE — Patient Instructions (Signed)
Hold warfarin tonight then decrease dose to 1/2 tablet daily except 1 tablet on Mondays and Thursdays Recheck in 2 weeks

## 2019-03-25 DIAGNOSIS — Z6824 Body mass index (BMI) 24.0-24.9, adult: Secondary | ICD-10-CM | POA: Diagnosis not present

## 2019-04-07 ENCOUNTER — Ambulatory Visit (INDEPENDENT_AMBULATORY_CARE_PROVIDER_SITE_OTHER): Payer: Medicare Other | Admitting: *Deleted

## 2019-04-07 ENCOUNTER — Other Ambulatory Visit: Payer: Self-pay

## 2019-04-07 DIAGNOSIS — Z5181 Encounter for therapeutic drug level monitoring: Secondary | ICD-10-CM

## 2019-04-07 DIAGNOSIS — I6350 Cerebral infarction due to unspecified occlusion or stenosis of unspecified cerebral artery: Secondary | ICD-10-CM

## 2019-04-07 LAB — POCT INR: INR: 1.7 — AB (ref 2.0–3.0)

## 2019-04-07 NOTE — Patient Instructions (Signed)
Take warfarin 1 1/2 tablets tonight then increase dose to 1/2 tablet daily except 1 tablet on Mondays, Wednesdays and Fridays Recheck in 3 weeks

## 2019-04-08 DIAGNOSIS — I5022 Chronic systolic (congestive) heart failure: Secondary | ICD-10-CM | POA: Diagnosis not present

## 2019-04-08 DIAGNOSIS — E7849 Other hyperlipidemia: Secondary | ICD-10-CM | POA: Diagnosis not present

## 2019-04-28 ENCOUNTER — Other Ambulatory Visit: Payer: Self-pay

## 2019-04-28 ENCOUNTER — Ambulatory Visit (INDEPENDENT_AMBULATORY_CARE_PROVIDER_SITE_OTHER): Payer: Medicare Other | Admitting: *Deleted

## 2019-04-28 DIAGNOSIS — Z5181 Encounter for therapeutic drug level monitoring: Secondary | ICD-10-CM

## 2019-04-28 DIAGNOSIS — I6350 Cerebral infarction due to unspecified occlusion or stenosis of unspecified cerebral artery: Secondary | ICD-10-CM

## 2019-04-28 LAB — POCT INR: INR: 2.6 (ref 2.0–3.0)

## 2019-04-28 NOTE — Patient Instructions (Signed)
Continue warfarin 1/2 tablet daily except 1 tablet on Mondays, Wednesdays and Fridays Recheck in 4 weeks 

## 2019-05-10 ENCOUNTER — Telehealth: Payer: Self-pay | Admitting: Cardiology

## 2019-05-10 NOTE — Telephone Encounter (Signed)
Attempted to contact pt. Unable to leave message as mailbox is full.  °

## 2019-05-10 NOTE — Telephone Encounter (Signed)
Patient would like Dr. Rosezella [place] recommendation on when it would be safe for her to travel for a vacation.

## 2019-05-10 NOTE — Telephone Encounter (Signed)
People calling to inquire when Dr. Warren Lacy feels it would be safe for her to travel again due to having COVID. Pt report MD was very adamant about not traveling and being around anyone previously.  Will route to MD for recommendations.

## 2019-05-10 NOTE — Telephone Encounter (Signed)
Follow Up:   Pt is returning a call from [name] today.

## 2019-05-11 DIAGNOSIS — I5022 Chronic systolic (congestive) heart failure: Secondary | ICD-10-CM | POA: Diagnosis not present

## 2019-05-11 DIAGNOSIS — E7849 Other hyperlipidemia: Secondary | ICD-10-CM | POA: Diagnosis not present

## 2019-05-12 NOTE — Telephone Encounter (Signed)
I would like for her to get the vaccine.  I would feel more comfortable about her being around folks if she is vaccinated and the people she is with are vaccinated.

## 2019-05-13 NOTE — Telephone Encounter (Signed)
Spoke with patient.   Per Dr. Percival Spanish: I would like for her to get the vaccine.  I would feel more comfortable about her being around folks if she is vaccinated and the people she is with are vaccinated.   Patient verbalized understanding.

## 2019-05-17 ENCOUNTER — Other Ambulatory Visit: Payer: Self-pay | Admitting: Cardiology

## 2019-05-26 ENCOUNTER — Ambulatory Visit (INDEPENDENT_AMBULATORY_CARE_PROVIDER_SITE_OTHER): Payer: Medicare Other | Admitting: *Deleted

## 2019-05-26 DIAGNOSIS — I5022 Chronic systolic (congestive) heart failure: Secondary | ICD-10-CM | POA: Diagnosis not present

## 2019-05-26 LAB — CUP PACEART REMOTE DEVICE CHECK
Battery Remaining Longevity: 64 mo
Battery Remaining Percentage: 66 %
Battery Voltage: 2.95 V
Brady Statistic AP VP Percent: 1 %
Brady Statistic AP VS Percent: 22 %
Brady Statistic AS VP Percent: 1 %
Brady Statistic AS VS Percent: 78 %
Brady Statistic RA Percent Paced: 21 %
Brady Statistic RV Percent Paced: 1 %
Date Time Interrogation Session: 20210415030022
HighPow Impedance: 63 Ohm
HighPow Impedance: 63 Ohm
Implantable Lead Implant Date: 20100416
Implantable Lead Implant Date: 20100416
Implantable Lead Location: 753859
Implantable Lead Location: 753860
Implantable Lead Model: 7122
Implantable Pulse Generator Implant Date: 20170626
Lead Channel Impedance Value: 390 Ohm
Lead Channel Impedance Value: 440 Ohm
Lead Channel Pacing Threshold Amplitude: 0.75 V
Lead Channel Pacing Threshold Amplitude: 1 V
Lead Channel Pacing Threshold Pulse Width: 0.5 ms
Lead Channel Pacing Threshold Pulse Width: 0.5 ms
Lead Channel Sensing Intrinsic Amplitude: 1.6 mV
Lead Channel Sensing Intrinsic Amplitude: 11.7 mV
Lead Channel Setting Pacing Amplitude: 2 V
Lead Channel Setting Pacing Amplitude: 2.5 V
Lead Channel Setting Pacing Pulse Width: 0.5 ms
Lead Channel Setting Sensing Sensitivity: 0.5 mV
Pulse Gen Serial Number: 7370130

## 2019-05-26 NOTE — Progress Notes (Signed)
ICD Remote  

## 2019-05-31 ENCOUNTER — Telehealth: Payer: Self-pay

## 2019-05-31 NOTE — Telephone Encounter (Signed)
LMOVM for pt to stop sending manual transmissions. Left my direct office number for the pt to call back. Pt monitor automatically transmit.

## 2019-06-01 ENCOUNTER — Other Ambulatory Visit: Payer: Self-pay

## 2019-06-01 ENCOUNTER — Ambulatory Visit (INDEPENDENT_AMBULATORY_CARE_PROVIDER_SITE_OTHER): Payer: Medicare Other | Admitting: *Deleted

## 2019-06-01 DIAGNOSIS — I6350 Cerebral infarction due to unspecified occlusion or stenosis of unspecified cerebral artery: Secondary | ICD-10-CM

## 2019-06-01 DIAGNOSIS — Z5181 Encounter for therapeutic drug level monitoring: Secondary | ICD-10-CM | POA: Diagnosis not present

## 2019-06-01 LAB — POCT INR: INR: 2.3 (ref 2.0–3.0)

## 2019-06-01 NOTE — Patient Instructions (Signed)
Continue warfarin 1/2 tablet daily except 1 tablet on Mondays, Wednesdays and Fridays Recheck in 5 weeks

## 2019-06-10 DIAGNOSIS — E7849 Other hyperlipidemia: Secondary | ICD-10-CM | POA: Diagnosis not present

## 2019-06-10 DIAGNOSIS — R7301 Impaired fasting glucose: Secondary | ICD-10-CM | POA: Diagnosis not present

## 2019-06-23 ENCOUNTER — Encounter: Payer: Medicare Other | Admitting: Internal Medicine

## 2019-06-28 DIAGNOSIS — E782 Mixed hyperlipidemia: Secondary | ICD-10-CM | POA: Diagnosis not present

## 2019-06-28 DIAGNOSIS — E876 Hypokalemia: Secondary | ICD-10-CM | POA: Diagnosis not present

## 2019-06-28 DIAGNOSIS — Z9189 Other specified personal risk factors, not elsewhere classified: Secondary | ICD-10-CM | POA: Diagnosis not present

## 2019-06-28 DIAGNOSIS — I5020 Unspecified systolic (congestive) heart failure: Secondary | ICD-10-CM | POA: Diagnosis not present

## 2019-06-28 DIAGNOSIS — R7301 Impaired fasting glucose: Secondary | ICD-10-CM | POA: Diagnosis not present

## 2019-07-05 DIAGNOSIS — E119 Type 2 diabetes mellitus without complications: Secondary | ICD-10-CM | POA: Diagnosis not present

## 2019-07-06 ENCOUNTER — Other Ambulatory Visit: Payer: Self-pay

## 2019-07-06 ENCOUNTER — Ambulatory Visit (INDEPENDENT_AMBULATORY_CARE_PROVIDER_SITE_OTHER): Payer: Medicare Other | Admitting: *Deleted

## 2019-07-06 DIAGNOSIS — I6350 Cerebral infarction due to unspecified occlusion or stenosis of unspecified cerebral artery: Secondary | ICD-10-CM | POA: Diagnosis not present

## 2019-07-06 DIAGNOSIS — Z5181 Encounter for therapeutic drug level monitoring: Secondary | ICD-10-CM | POA: Diagnosis not present

## 2019-07-06 LAB — POCT INR: INR: 2.5 (ref 2.0–3.0)

## 2019-07-06 NOTE — Patient Instructions (Signed)
Continue warfarin 1/2 tablet daily except 1 tablet on Mondays, Wednesdays and Fridays Recheck in 6 weeks 

## 2019-07-07 DIAGNOSIS — E876 Hypokalemia: Secondary | ICD-10-CM | POA: Diagnosis not present

## 2019-07-07 DIAGNOSIS — R7301 Impaired fasting glucose: Secondary | ICD-10-CM | POA: Diagnosis not present

## 2019-07-07 DIAGNOSIS — I5022 Chronic systolic (congestive) heart failure: Secondary | ICD-10-CM | POA: Diagnosis not present

## 2019-07-07 DIAGNOSIS — E782 Mixed hyperlipidemia: Secondary | ICD-10-CM | POA: Diagnosis not present

## 2019-07-07 DIAGNOSIS — Z0001 Encounter for general adult medical examination with abnormal findings: Secondary | ICD-10-CM | POA: Diagnosis not present

## 2019-07-07 DIAGNOSIS — Z6824 Body mass index (BMI) 24.0-24.9, adult: Secondary | ICD-10-CM | POA: Diagnosis not present

## 2019-07-07 DIAGNOSIS — R300 Dysuria: Secondary | ICD-10-CM | POA: Diagnosis not present

## 2019-07-07 DIAGNOSIS — Z23 Encounter for immunization: Secondary | ICD-10-CM | POA: Diagnosis not present

## 2019-07-08 ENCOUNTER — Ambulatory Visit (INDEPENDENT_AMBULATORY_CARE_PROVIDER_SITE_OTHER): Payer: Medicare Other | Admitting: Internal Medicine

## 2019-07-08 ENCOUNTER — Other Ambulatory Visit: Payer: Self-pay

## 2019-07-08 ENCOUNTER — Encounter: Payer: Self-pay | Admitting: Internal Medicine

## 2019-07-08 VITALS — BP 90/58 | HR 69 | Ht 68.0 in | Wt 164.0 lb

## 2019-07-08 DIAGNOSIS — I5022 Chronic systolic (congestive) heart failure: Secondary | ICD-10-CM | POA: Diagnosis not present

## 2019-07-08 DIAGNOSIS — I420 Dilated cardiomyopathy: Secondary | ICD-10-CM | POA: Diagnosis not present

## 2019-07-08 DIAGNOSIS — Z95810 Presence of automatic (implantable) cardiac defibrillator: Secondary | ICD-10-CM

## 2019-07-08 LAB — CUP PACEART INCLINIC DEVICE CHECK
Battery Remaining Longevity: 67 mo
Brady Statistic RA Percent Paced: 21 %
Brady Statistic RV Percent Paced: 0.04 %
Date Time Interrogation Session: 20210528143952
HighPow Impedance: 64.125
Implantable Lead Implant Date: 20100416
Implantable Lead Implant Date: 20100416
Implantable Lead Location: 753859
Implantable Lead Location: 753860
Implantable Lead Model: 7122
Implantable Pulse Generator Implant Date: 20170626
Lead Channel Impedance Value: 437.5 Ohm
Lead Channel Impedance Value: 450 Ohm
Lead Channel Pacing Threshold Amplitude: 0.5 V
Lead Channel Pacing Threshold Amplitude: 1 V
Lead Channel Pacing Threshold Pulse Width: 0.5 ms
Lead Channel Pacing Threshold Pulse Width: 0.5 ms
Lead Channel Sensing Intrinsic Amplitude: 1.5 mV
Lead Channel Sensing Intrinsic Amplitude: 12 mV
Lead Channel Setting Pacing Amplitude: 2 V
Lead Channel Setting Pacing Amplitude: 2.5 V
Lead Channel Setting Pacing Pulse Width: 0.5 ms
Lead Channel Setting Sensing Sensitivity: 0.5 mV
Pulse Gen Serial Number: 7370130

## 2019-07-08 NOTE — Progress Notes (Signed)
Patient Care Team: Caryl Bis, MD as PCP - General (Family Medicine)   HPI  [name] [name] is a 69 y.o. female Seen in followup for ICD [employer]  implanted for primary prevention in the setting of nonischemic heart disease + for LAMIN gene mutation.  June 2017 underwent device generator replacement.  Got covid in Jan  And then vaccinated with rxn ( #1&2)  Gradually but progressively improving.  No specific shortness of breath or edema.    Date Cr K Hgb  1/21 0.83 3.9     5/21   12.      Past Medical History:  Diagnosis Date  . Arthritis    thumbs  . Breast cancer ([place])    right  . Cerebrovascular disease    s/p right MCA cardioemoblic infarct A999333  . CHF (congestive heart failure) ([place])   . Nonischemic cardiomyopathy (HCC)    a. +LMNA mutation b. s/p STJ dual chamber ICD  . Stroke [facility]) 02/2008   no deficits    Past Surgical History:  Procedure Laterality Date  . ABDOMINAL HYSTERECTOMY    . BREAST IMPLANT REMOVAL Right 05/31/2015   Procedure: REMOVAL RIGHT  BREAST IMPLANTS;  Surgeon: Crissie Reese, MD;  Location: [place];  Service: Plastics;  Laterality: Right;  . BREAST RECONSTRUCTION Right 1995  . BREAST RECONSTRUCTION Right 05/31/2015   Procedure: RIGHT BREAST RECONSTRUCTION WITH SALINE IMPLANTS ;  Surgeon: Crissie Reese, MD;  Location: [place];  Service: Plastics;  Laterality: Right;  . CARDIAC DEFIBRILLATOR PLACEMENT  2010   STJ dual chamber ICD implanted for NICM by Dr Caryl Comes  . CARDIAC DEFIBRILLATOR PLACEMENT  05/26/2008  . CATARACT EXTRACTION W/PHACO Right 03/19/2018   Procedure: CATARACT EXTRACTION PHACO AND INTRAOCULAR LENS PLACEMENT ([place]);  Surgeon: Baruch Goldmann, MD;  [facility];  Service: Ophthalmology;  Laterality: Right;  CDE: 5.42  . CATARACT EXTRACTION W/PHACO Left 10/01/2018   Procedure: CATARACT EXTRACTION PHACO AND INTRAOCULAR LENS PLACEMENT ([facility]);  Surgeon: Baruch Goldmann, MD;  [facility];  Service: Ophthalmology;  Laterality:  Left;  left, CDE: 6.06  . CHOLECYSTECTOMY    . COLONOSCOPY    . EP IMPLANTABLE DEVICE N/A 08/06/2015   Procedure: ICD Generator Changeout;  Surgeon: Deboraha Sprang, MD;  Location: [place] [facility];  Service: Cardiovascular;  Laterality: N/A;  . ESOPHAGOGASTRODUODENOSCOPY    . MASTECTOMY Right   . MASTOPEXY Left 05/31/2015   Procedure: LEFT BREAST MASTOPEXY;  Surgeon: Crissie Reese, MD;  Location: [place];  Service: Plastics;  Laterality: Left;  . PACEMAKER INSERTION  05/2008  . TONSILLECTOMY    . VARICOSE VEIN SURGERY Left    leg    Current Outpatient Medications  Medication Sig Dispense Refill  . acetaminophen (TYLENOL) 325 MG tablet Take 2 tablets (650 mg total) by mouth every 6 (six) hours as needed for mild pain or headache (fever >/= 101). 20 tablet 2  . ascorbic acid (VITAMIN C) 500 MG tablet Take 1 tablet (500 mg total) by mouth daily. 30 tablet 2  . atorvastatin (LIPITOR) 20 MG tablet Take 1 tablet (20 mg total) by mouth daily. 30 tablet 2  . BIOTIN PO Take 1 tablet by mouth daily.    . digoxin (LANOXIN) 0.125 MG tablet TAKE ONE TABLET BY MOUTH DAILY. 90 tablet 2  . furosemide (LASIX) 40 MG tablet Take 0.5 tablets (20 mg total) by mouth as needed for fluid or edema (q24 hrs as needed). 90 tablet 3  .  metoprolol succinate (TOPROL-XL) 25 MG 24 hr tablet Take 0.5 tablets (12.5 mg total) by mouth daily. 30 tablet 1  . Multiple Vitamin (MULTIVITAMIN WITH MINERALS) TABS tablet Take 1 tablet by mouth daily.    . Omega-3 Fatty Acids (FISH OIL PO) Take 1 capsule by mouth daily.     Marland Kitchen Propylene Glycol (SYSTANE COMPLETE) 0.6 % SOLN Place 1 drop into both eyes daily as needed (dry eyes).     . sacubitril-valsartan (ENTRESTO) 97-103 MG Take 1 tablet by mouth 2 (two) times daily. 60 tablet 1  . spironolactone (ALDACTONE) 25 MG tablet Take 0.5 tablets (12.5 mg total) by mouth daily. K+ sparing diuretic: Hyperkalemia may occur with decreased renal function 45 tablet 3  . warfarin (COUMADIN) 5 MG  tablet TAKE 1/2 TO 1 TABLET BY MOUTH DAILY AS DIRECTED COUMADIN CLINIC 90 tablet 0  . zinc sulfate 220 (50 Zn) MG capsule Take 1 capsule (220 mg total) by mouth daily. 30 capsule 1   No current facility-administered medications for this visit.    No Known Allergies  Review of Systems negative except from HPI and PMH  Physical Exam BP (!) 90/58   Pulse 69   Ht 5\' 8"  (1.727 m)   Wt 164 lb (74.4 kg)   SpO2 93%   BMI 24.94 kg/m  Well developed and well nourished in no acute distress HENT normal Neck supple with JVP-flat Clear Device pocket well healed; without hematoma or erythema.  There is no tethering  Regular rate and rhythm, no  murmur Abd-soft with active BS No Clubbing cyanosis sore throat was that lady's neck is wife's concern of did he get arrived he was not arriving the callus are scheduled edema Skin-warm and dry A & Oriented  Grossly normal sensory and motor function  ECG  Sinus @ 69 22/13/40 PACs and couplets  Assessment and  Plan  DCM  ICD The patient's device was interrogated.  The information was reviewed. No changes were made in the programming.     Fam hx of + gene mutation  High risk medication surveillance  VTNS   Doing well and recovering from [place] continue current meds  VT nonsustained but no treated VT  Son has not been screened

## 2019-07-08 NOTE — Patient Instructions (Signed)

## 2019-08-10 DIAGNOSIS — I5022 Chronic systolic (congestive) heart failure: Secondary | ICD-10-CM | POA: Diagnosis not present

## 2019-08-10 DIAGNOSIS — Z23 Encounter for immunization: Secondary | ICD-10-CM | POA: Diagnosis not present

## 2019-08-10 DIAGNOSIS — E876 Hypokalemia: Secondary | ICD-10-CM | POA: Diagnosis not present

## 2019-08-10 DIAGNOSIS — M7062 Trochanteric bursitis, left hip: Secondary | ICD-10-CM | POA: Diagnosis not present

## 2019-08-10 DIAGNOSIS — R7301 Impaired fasting glucose: Secondary | ICD-10-CM | POA: Diagnosis not present

## 2019-08-10 DIAGNOSIS — E7849 Other hyperlipidemia: Secondary | ICD-10-CM | POA: Diagnosis not present

## 2019-08-20 ENCOUNTER — Other Ambulatory Visit: Payer: Self-pay | Admitting: Cardiology

## 2019-08-22 ENCOUNTER — Ambulatory Visit (INDEPENDENT_AMBULATORY_CARE_PROVIDER_SITE_OTHER): Payer: Medicare Other | Admitting: *Deleted

## 2019-08-22 ENCOUNTER — Other Ambulatory Visit: Payer: Self-pay

## 2019-08-22 DIAGNOSIS — Z5181 Encounter for therapeutic drug level monitoring: Secondary | ICD-10-CM

## 2019-08-22 DIAGNOSIS — I6350 Cerebral infarction due to unspecified occlusion or stenosis of unspecified cerebral artery: Secondary | ICD-10-CM

## 2019-08-22 LAB — POCT INR: INR: 2 (ref 2.0–3.0)

## 2019-08-22 NOTE — Patient Instructions (Signed)
Continue warfarin 1/2 tablet daily except 1 tablet on Mondays, Wednesdays and Fridays Recheck in 6 weeks 

## 2019-08-25 ENCOUNTER — Ambulatory Visit (INDEPENDENT_AMBULATORY_CARE_PROVIDER_SITE_OTHER): Payer: Medicare Other | Admitting: *Deleted

## 2019-08-25 DIAGNOSIS — I420 Dilated cardiomyopathy: Secondary | ICD-10-CM

## 2019-08-25 DIAGNOSIS — I5022 Chronic systolic (congestive) heart failure: Secondary | ICD-10-CM

## 2019-08-25 LAB — CUP PACEART REMOTE DEVICE CHECK
Battery Remaining Longevity: 61 mo
Battery Remaining Percentage: 63 %
Battery Voltage: 2.95 V
Brady Statistic AP VP Percent: 1 %
Brady Statistic AP VS Percent: 21 %
Brady Statistic AS VP Percent: 1 %
Brady Statistic AS VS Percent: 78 %
Brady Statistic RA Percent Paced: 21 %
Brady Statistic RV Percent Paced: 1 %
Date Time Interrogation Session: 20210715020015
HighPow Impedance: 70 Ohm
HighPow Impedance: 70 Ohm
Implantable Lead Implant Date: 20100416
Implantable Lead Implant Date: 20100416
Implantable Lead Location: 753859
Implantable Lead Location: 753860
Implantable Lead Model: 7122
Implantable Pulse Generator Implant Date: 20170626
Lead Channel Impedance Value: 430 Ohm
Lead Channel Impedance Value: 450 Ohm
Lead Channel Pacing Threshold Amplitude: 0.5 V
Lead Channel Pacing Threshold Amplitude: 1 V
Lead Channel Pacing Threshold Pulse Width: 0.5 ms
Lead Channel Pacing Threshold Pulse Width: 0.5 ms
Lead Channel Sensing Intrinsic Amplitude: 1.5 mV
Lead Channel Sensing Intrinsic Amplitude: 12 mV
Lead Channel Setting Pacing Amplitude: 2 V
Lead Channel Setting Pacing Amplitude: 2.5 V
Lead Channel Setting Pacing Pulse Width: 0.5 ms
Lead Channel Setting Sensing Sensitivity: 0.5 mV
Pulse Gen Serial Number: 7370130

## 2019-08-26 NOTE — Progress Notes (Signed)
Remote ICD transmission.   

## 2019-09-09 DIAGNOSIS — I5022 Chronic systolic (congestive) heart failure: Secondary | ICD-10-CM | POA: Diagnosis not present

## 2019-09-09 DIAGNOSIS — E876 Hypokalemia: Secondary | ICD-10-CM | POA: Diagnosis not present

## 2019-09-09 DIAGNOSIS — E7849 Other hyperlipidemia: Secondary | ICD-10-CM | POA: Diagnosis not present

## 2019-09-09 DIAGNOSIS — R7301 Impaired fasting glucose: Secondary | ICD-10-CM | POA: Diagnosis not present

## 2019-09-11 NOTE — Progress Notes (Signed)
Cardiology Office Note   Date:  09/12/2019   ID:  [name], [name] Jan 15, 1951, MRN [id number]  PCP:  Caryl Bis, MD  Cardiologist:   No primary care provider on file.    Chief Complaint  Patient presents with  . Cardiomyopathy     History of Present Illness: [name] [name] is a 69 y.o. female who presents for follow up of cardiomyopathy.  She has a history of a nonischemic cardiomyopathy. Her ejection fraction was at one point in time 15 - 20%. A later ejection fraction was about 40% in 2015.   Most recently the EF was 25% again.   I sent her for a CPX which demonstrated a good VO2 Max.    Since I last saw her she has done well.  She is recovered from a severe case of COVID-19.  At the last visit I did increase her [name] which has been reduced when she has been severely ill.  She did fine with this. The patient denies any new symptoms such as chest discomfort, neck or arm discomfort. There has been no new shortness of breath, PND or orthopnea. There have been no reported palpitations, presyncope or syncope.    Past Medical History:  Diagnosis Date  . Arthritis    thumbs  . Breast cancer ([place])    right  . Cerebrovascular disease    s/p right MCA cardioemoblic infarct 0/93/26  . CHF (congestive heart failure) ([place])   . Nonischemic cardiomyopathy (HCC)    a. +LMNA mutation b. s/p STJ dual chamber ICD  . Stroke [facility]) 02/2008   no deficits    Past Surgical History:  Procedure Laterality Date  . ABDOMINAL HYSTERECTOMY    . BREAST IMPLANT REMOVAL Right 05/31/2015   Procedure: REMOVAL RIGHT  BREAST IMPLANTS;  Surgeon: Crissie Reese, MD;  Location: [place];  Service: Plastics;  Laterality: Right;  . BREAST RECONSTRUCTION Right 1995  . BREAST RECONSTRUCTION Right 05/31/2015   Procedure: RIGHT BREAST RECONSTRUCTION WITH SALINE IMPLANTS ;  Surgeon: Crissie Reese, MD;  Location: [place];  Service: Plastics;  Laterality: Right;  . CARDIAC DEFIBRILLATOR PLACEMENT  2010   STJ  dual chamber ICD implanted for NICM by Dr Caryl Comes  . CARDIAC DEFIBRILLATOR PLACEMENT  05/26/2008  . CATARACT EXTRACTION W/PHACO Right 03/19/2018   Procedure: CATARACT EXTRACTION PHACO AND INTRAOCULAR LENS PLACEMENT ([place]);  Surgeon: Baruch Goldmann, MD;  [facility]: [facility];  Service: Ophthalmology;  Laterality: Right;  CDE: 5.42  . CATARACT EXTRACTION W/PHACO Left 10/01/2018   Procedure: CATARACT EXTRACTION PHACO AND INTRAOCULAR LENS PLACEMENT ([facility]);  Surgeon: Baruch Goldmann, MD;  Location: [facility];  Service: Ophthalmology;  Laterality: Left;  left, CDE: 6.06  . CHOLECYSTECTOMY    . COLONOSCOPY    . EP IMPLANTABLE DEVICE N/A 08/06/2015   Procedure: ICD Generator Changeout;  Surgeon: Deboraha Sprang, MD;  Location: [place] [facility];  Service: Cardiovascular;  Laterality: N/A;  . ESOPHAGOGASTRODUODENOSCOPY    . MASTECTOMY Right   . MASTOPEXY Left 05/31/2015   Procedure: LEFT BREAST MASTOPEXY;  Surgeon: Crissie Reese, MD;  Location: [place];  Service: Plastics;  Laterality: Left;  . PACEMAKER INSERTION  05/2008  . TONSILLECTOMY    . VARICOSE VEIN SURGERY Left    leg     Current Outpatient Medications  Medication Sig Dispense Refill  . acetaminophen (TYLENOL) 325 MG tablet Take 2 tablets (650 mg total) by mouth every 6 (six) hours as needed for mild pain or headache (fever >/=  101). 20 tablet 2  . ascorbic acid (VITAMIN C) 500 MG tablet Take 1 tablet (500 mg total) by mouth daily. 30 tablet 2  . atorvastatin (LIPITOR) 20 MG tablet Take 1 tablet (20 mg total) by mouth daily. 30 tablet 2  . BIOTIN PO Take 1 tablet by mouth daily.    . digoxin (LANOXIN) 0.125 MG tablet TAKE ONE TABLET BY MOUTH DAILY. 90 tablet 2  . furosemide (LASIX) 40 MG tablet Take 0.5 tablets (20 mg total) by mouth as needed for fluid or edema (q24 hrs as needed). 90 tablet 3  . Multiple Vitamin (MULTIVITAMIN WITH MINERALS) TABS tablet Take 1 tablet by mouth daily.    . Omega-3 Fatty Acids (FISH OIL PO) Take 1 capsule by mouth daily.      . sacubitril-valsartan (ENTRESTO) 97-103 MG Take 1 tablet by mouth 2 (two) times daily. 60 tablet 1  . spironolactone (ALDACTONE) 25 MG tablet Take 0.5 tablets (12.5 mg total) by mouth daily. K+ sparing diuretic: Hyperkalemia may occur with decreased renal function 45 tablet 3  . warfarin (COUMADIN) 5 MG tablet TAKE 1/2 TO 1 TABLET BY MOUTH DAILY AS DIRECTED COUMADIN CLINIC 90 tablet 0  . zinc sulfate 220 (50 Zn) MG capsule Take 1 capsule (220 mg total) by mouth daily. 30 capsule 1  . metoprolol succinate (TOPROL-XL) 50 MG 24 hr tablet Take 50 mg by mouth 2 (two) times daily.    . nitrofurantoin (MACRODANTIN) 50 MG capsule Take 50 mg by mouth as needed.     No current facility-administered medications for this visit.    Allergies:   Patient has no known allergies.   ROS:  Please see the history of present illness.   Otherwise, review of systems are positive for bursitis.   All other systems are reviewed and negative.    PHYSICAL EXAM: VS:  BP (!) 130/72 (BP Location: Left Arm, Patient Position: Sitting, Cuff Size: Normal)   Pulse 67   Ht 5\' 8"  (1.727 m)   Wt 163 lb 6.4 oz (74.1 kg)   SpO2 99%   BMI 24.84 kg/m  , BMI Body mass index is 24.84 kg/m. GENERAL:  Well appearing NECK:  No jugular venous distention, waveform within normal limits, carotid upstroke brisk and symmetric, no bruits, no thyromegaly LUNGS:  Clear to auscultation bilaterally CHEST:  Unremarkable HEART:  PMI not displaced or sustained,S1 and S2 within normal limits, no S3, no S4, no clicks, no rubs, no murmurs ABD:  Flat, positive bowel sounds normal in frequency in pitch, no bruits, no rebound, no guarding, no midline pulsatile mass, no hepatomegaly, no splenomegaly EXT:  2 plus pulses throughout, no edema, no cyanosis no clubbing   EKG:  EKG is not ordered today.    Recent Labs: 02/23/2019: ALT 55; BUN 19; Creatinine, Ser 0.61; Hemoglobin 12.1; Platelets 322; Potassium 3.9; Sodium 138    Lipid Panel      Component Value Date/Time   CHOL  03/10/2008 0500    128        ATP III CLASSIFICATION:  <200     mg/dL   Desirable  200-239  mg/dL   Borderline High  >=240    mg/dL   High          TRIG 167 (H) 02/19/2019 1244   HDL 33 (L) 03/10/2008 0500   CHOLHDL 3.9 03/10/2008 0500   VLDL 13 03/10/2008 0500   LDLCALC  03/10/2008 0500    82  Total Cholesterol/HDL:CHD Risk Coronary Heart Disease Risk Table                     Men   Women  1/2 Average Risk   3.4   3.3  Average Risk       5.0   4.4  2 X Average Risk   9.6   7.1  3 X Average Risk  23.4   11.0        Use the calculated Patient Ratio above and the CHD Risk Table to determine the patient's CHD Risk.        ATP III CLASSIFICATION (LDL):  <100     mg/dL   Optimal  100-129  mg/dL   Near or Above                    Optimal  130-159  mg/dL   Borderline  160-189  mg/dL   High  >190     mg/dL   Very High      Wt Readings from Last 3 Encounters:  09/12/19 163 lb 6.4 oz (74.1 kg)  07/08/19 164 lb (74.4 kg)  03/11/19 166 lb 3.2 oz (75.4 kg)      Other studies Reviewed: Additional studies/ records that were reviewed today include: Labs Review of the above records demonstrates:  Please see elsewhere in the note.     ASSESSMENT AND PLAN:  CARDIOMYOPATHY, PRIMARY, DILATED - She done well with this.  No change in therapy.  ICD -  She is up-to-date with her most recent home remote evaluation.  I reviewed the EP notes for this visit.     CVA -  She has no residual from this.    COVID EDUCATION:   She has had COVID 19 and she has been vaccinated.  We talked about the booster  Current medicines are reviewed at length with the patient today.  The patient does not have concerns regarding medicines.  The following changes have been made:  None Labs/ tests ordered today include:  None  No orders of the defined types were placed in this encounter.    Disposition:   FU with in Nov.  Signed, Minus Breeding, MD   09/12/2019 11:37 AM    [place]

## 2019-09-12 ENCOUNTER — Ambulatory Visit: Payer: Medicare Other | Admitting: Cardiology

## 2019-09-12 ENCOUNTER — Other Ambulatory Visit: Payer: Self-pay

## 2019-09-12 ENCOUNTER — Other Ambulatory Visit: Payer: Self-pay | Admitting: Cardiology

## 2019-09-12 ENCOUNTER — Encounter: Payer: Self-pay | Admitting: Cardiology

## 2019-09-12 VITALS — BP 130/72 | HR 67 | Ht 68.0 in | Wt 163.4 lb

## 2019-09-12 DIAGNOSIS — Z4502 Encounter for adjustment and management of automatic implantable cardiac defibrillator: Secondary | ICD-10-CM | POA: Diagnosis not present

## 2019-09-12 DIAGNOSIS — I420 Dilated cardiomyopathy: Secondary | ICD-10-CM

## 2019-09-12 NOTE — Patient Instructions (Signed)
Medication Instructions:  Your physician recommends that you continue on your current medications as directed. Please refer to the Current Medication list given to you today.  *If you need a refill on your cardiac medications before your next appointment, please call your pharmacy*  Lab Work: NONE ordered at this time of appointment   If you have labs (blood work) drawn today and your tests are completely normal, you will receive your results only by: . MyChart Message (if you have MyChart) OR . A paper copy in the mail If you have any lab test that is abnormal or we need to change your treatment, we will call you to review the results.  Testing/Procedures: NONE ordered at this time of appointment   Follow-Up: At [facility], you and your health needs are our priority.  As part of our continuing mission to provide you with exceptional heart care, we have created designated Provider Care Teams.  These Care Teams include your primary Cardiologist (physician) and Advanced Practice Providers (APPs -  Physician Assistants and Nurse Practitioners) who all work together to provide you with the care you need, when you need it.  Your next appointment:   3 month(s)  The format for your next appointment:   In Person  Provider:   James Hochrein, MD  Other Instructions   

## 2019-09-12 NOTE — Telephone Encounter (Signed)
*  STAT* If patient is at the pharmacy, call can be transferred to refill team.   1. Which medications need to be refilled? (please list name of each medication and dose if known) sacubitril-valsartan (ENTRESTO) 97-103 MG  2. Which pharmacy/location (including street and city if local pharmacy) is medication to be sent to? [facility] [place]  3. Do they need a 30 day or 90 day supply? 90 day   Patient is out of medication

## 2019-09-13 ENCOUNTER — Telehealth: Payer: Self-pay | Admitting: Cardiology

## 2019-09-13 MED ORDER — ENTRESTO 97-103 MG PO TABS: 1.0000 | ORAL_TABLET | Freq: Two times a day (BID) | ORAL | 1 refills | Status: DC

## 2019-09-13 NOTE — Telephone Encounter (Signed)
Patient calling to check on status of refill. She states she is out of medication.

## 2019-09-13 NOTE — Telephone Encounter (Signed)
° °*  STAT* If patient is at the pharmacy, call can be transferred to refill team.   1. Which medications need to be refilled? (please list name of each medication and dose if known)   sacubitril-valsartan (ENTRESTO) 97-103 MG  2. Which pharmacy/location (including street and city if local pharmacy) is medication to be sent to?  [facility] [place], [place]  3. Do they need a 30 day or 90 day supply? [place]

## 2019-09-26 DIAGNOSIS — Z961 Presence of intraocular lens: Secondary | ICD-10-CM | POA: Diagnosis not present

## 2019-09-26 DIAGNOSIS — H1712 Central corneal opacity, left eye: Secondary | ICD-10-CM | POA: Diagnosis not present

## 2019-09-26 DIAGNOSIS — H26493 Other secondary cataract, bilateral: Secondary | ICD-10-CM | POA: Diagnosis not present

## 2019-10-03 ENCOUNTER — Other Ambulatory Visit: Payer: Self-pay

## 2019-10-03 ENCOUNTER — Ambulatory Visit (INDEPENDENT_AMBULATORY_CARE_PROVIDER_SITE_OTHER): Payer: Medicare Other | Admitting: *Deleted

## 2019-10-03 DIAGNOSIS — Z5181 Encounter for therapeutic drug level monitoring: Secondary | ICD-10-CM

## 2019-10-03 DIAGNOSIS — I6350 Cerebral infarction due to unspecified occlusion or stenosis of unspecified cerebral artery: Secondary | ICD-10-CM | POA: Diagnosis not present

## 2019-10-03 LAB — POCT INR: INR: 3.1 — AB (ref 2.0–3.0)

## 2019-10-03 NOTE — Patient Instructions (Signed)
Take warfarin 1/2 tablet tonight then resume 1/2 tablet daily except 1 tablet on Mondays, Wednesdays and Fridays Recheck in 6 weeks

## 2019-10-11 DIAGNOSIS — I5022 Chronic systolic (congestive) heart failure: Secondary | ICD-10-CM | POA: Diagnosis not present

## 2019-10-11 DIAGNOSIS — E7849 Other hyperlipidemia: Secondary | ICD-10-CM | POA: Diagnosis not present

## 2019-10-11 DIAGNOSIS — R7301 Impaired fasting glucose: Secondary | ICD-10-CM | POA: Diagnosis not present

## 2019-10-11 DIAGNOSIS — E876 Hypokalemia: Secondary | ICD-10-CM | POA: Diagnosis not present

## 2019-10-12 ENCOUNTER — Other Ambulatory Visit: Payer: Self-pay | Admitting: Cardiology

## 2019-11-08 ENCOUNTER — Other Ambulatory Visit: Payer: Self-pay | Admitting: Cardiology

## 2019-11-10 DIAGNOSIS — E876 Hypokalemia: Secondary | ICD-10-CM | POA: Diagnosis not present

## 2019-11-10 DIAGNOSIS — I5022 Chronic systolic (congestive) heart failure: Secondary | ICD-10-CM | POA: Diagnosis not present

## 2019-11-10 DIAGNOSIS — R7301 Impaired fasting glucose: Secondary | ICD-10-CM | POA: Diagnosis not present

## 2019-11-10 DIAGNOSIS — E7849 Other hyperlipidemia: Secondary | ICD-10-CM | POA: Diagnosis not present

## 2019-11-14 ENCOUNTER — Ambulatory Visit (INDEPENDENT_AMBULATORY_CARE_PROVIDER_SITE_OTHER): Payer: Medicare Other | Admitting: *Deleted

## 2019-11-14 DIAGNOSIS — Z5181 Encounter for therapeutic drug level monitoring: Secondary | ICD-10-CM | POA: Diagnosis not present

## 2019-11-14 DIAGNOSIS — I6350 Cerebral infarction due to unspecified occlusion or stenosis of unspecified cerebral artery: Secondary | ICD-10-CM | POA: Diagnosis not present

## 2019-11-14 LAB — POCT INR: INR: 3.4 — AB (ref 2.0–3.0)

## 2019-11-14 NOTE — Patient Instructions (Signed)
Hold warfarin tonight then decrease dose to 1/2 tablet daily except 1 tablet on Mondays and Thursdays Recheck in 4 weeks

## 2019-11-15 ENCOUNTER — Other Ambulatory Visit: Payer: Self-pay | Admitting: Cardiology

## 2019-11-24 ENCOUNTER — Ambulatory Visit (INDEPENDENT_AMBULATORY_CARE_PROVIDER_SITE_OTHER): Payer: Medicare Other

## 2019-11-24 ENCOUNTER — Telehealth: Payer: Self-pay | Admitting: Cardiology

## 2019-11-24 DIAGNOSIS — I420 Dilated cardiomyopathy: Secondary | ICD-10-CM

## 2019-11-24 NOTE — Telephone Encounter (Signed)
I spoke with patient. She reports she went to [place] over the weekend.  On Monday her feet were swollen from walking.  Weight was up to 169 lbs. She took prn lasix and weight went down to 161 lbs on Tuesday morning.  Swelling improved.  Weight today is 160 lbs.  Not taking lasix now. She has felt dizzy the last 2 days.  Yesterday was worse than today.  Yesterday she was walking and had to decrease her walk because she felt like she may pass out.  Today she has limited her activities.  Feels weak.  BP and heart rate readings listed in my chart message.  No chest pain or shortness of breath.  Her BP machine shows heart rate was irregular on last reading.  She has an ICD and will send a remote reading.  Will forward note to DOD for review

## 2019-11-24 NOTE — Telephone Encounter (Signed)
Reviewed with Dr Margaretann Loveless (office DOD) and she can see patient for virtual visit tomorrow. I spoke with patient and she would like to do phone visit.  Appointment scheduled for 8 AM on 11/25/19.  Patient reports she spoke with device rep and was able to send transmission. Patient aware to go to ED if symptoms worsen prior to appointment

## 2019-11-24 NOTE — Telephone Encounter (Signed)
  1. Has your device fired?  Unsure   2. Is you device beeping?   yes  3. Are you experiencing draining or swelling at device site?  no  4. Are you calling to see if we received your device transmission?  yes  5. Have you passed out?  no  Please route to Rew call to device clinic

## 2019-11-24 NOTE — Telephone Encounter (Signed)
I spoke with the pt about her monitor. I went over some trouble shooting steps. I gave her the number to [facility] tech support to get additional help. I gave her my direct office number to call to check if we received the transmission tomorrow.

## 2019-11-25 ENCOUNTER — Telehealth (INDEPENDENT_AMBULATORY_CARE_PROVIDER_SITE_OTHER): Payer: Medicare Other | Admitting: Internal Medicine

## 2019-11-25 VITALS — BP 114/63 | HR 63 | Ht 68.0 in | Wt 161.6 lb

## 2019-11-25 DIAGNOSIS — R42 Dizziness and giddiness: Secondary | ICD-10-CM

## 2019-11-25 DIAGNOSIS — I420 Dilated cardiomyopathy: Secondary | ICD-10-CM

## 2019-11-25 DIAGNOSIS — I5022 Chronic systolic (congestive) heart failure: Secondary | ICD-10-CM

## 2019-11-25 DIAGNOSIS — Z79899 Other long term (current) drug therapy: Secondary | ICD-10-CM | POA: Diagnosis not present

## 2019-11-25 DIAGNOSIS — Z4502 Encounter for adjustment and management of automatic implantable cardiac defibrillator: Secondary | ICD-10-CM | POA: Diagnosis not present

## 2019-11-25 DIAGNOSIS — Z5181 Encounter for therapeutic drug level monitoring: Secondary | ICD-10-CM | POA: Diagnosis not present

## 2019-11-25 NOTE — Progress Notes (Signed)
Virtual Visit via Telephone Note   This visit type was conducted due to national recommendations for restrictions regarding the COVID-19 Pandemic (e.g. social distancing) in an effort to limit this patient's exposure and mitigate transmission in our community.  Due to her co-morbid illnesses, this patient is at least at moderate risk for complications without adequate follow up.  This format is felt to be most appropriate for this patient at this time.  The patient did not have access to video technology/had technical difficulties with video requiring transitioning to audio format only (telephone).  All issues noted in this document were discussed and addressed.  No physical exam could be performed with this format.  Please refer to the patient's chart for her  consent to telehealth for [facility].    Date:  11/25/2019   ID:  [name] [name], DOB 1950/04/23, MRN [id number] The patient was identified using 2 identifiers.  Patient Location: Home Provider Location: Home Office  PCP:  Caryl Bis, MD  Cardiologist:  No primary care provider on file.  Electrophysiologist:  None   Evaluation Performed:  Follow-Up Visit  Chief Complaint:  Dizzy, hypotension, blurry vision  History of Present Illness:    [name] [name] is a 69 y.o. female with a history of nonischemic cardiomyopathy with previously severely reduced EF and subsequent improvement.  Last echocardiogram obtained in February 2021 demonstrates LVEF 40 to 45% and grossly preserved right ventricular size and function.  The patient called into triage yesterday evening due to concerns of hypotension, blurry vision, dizziness which was unusual for her.  She tells me she has never needed to call into Dr. Rosezella [place] office before this event made her quite concerned.  She traveled to [place] and on Monday of this past week she noticed increased lower extremity swelling and increased weight.  Weight went up to 169 pounds.  She  took 40 mg of Lasix and then afterward took half a tablet in addition, for a total of approximately 60 mg of Lasix in the day.  She then notes that she was up to the bathroom every hour and passed "plenty of urine".  By Tuesday her weight had dropped approximately 8 pounds.  On Tuesday afternoon she began to feel unwell as described above.  She worked in her garden with her husband, and the next day had blurry vision and hypotension.  Notes that her systolic blood pressure was in the 90s and her oxygen saturations documented at home were in the 80s.  She takes Lasix as needed, and has not taken any since Monday.  Today she is feeling slightly better and her blood pressure is improved to a systolic 338 mmHg.  She is still somewhat concerned about this event and wanted to make sure that she did not have an issue with arrhythmia given that she has a defibrillator.  She has sent that recording to the device monitor company and she is awaiting interpretation.  She has been on Coumadin for a prior stroke.  She takes digoxin.  The patient does not have symptoms concerning for COVID-19 infection (fever, chills, cough, or new shortness of breath).    Past Medical History:  Diagnosis Date  . Arthritis    thumbs  . Breast cancer ([place])    right  . Cerebrovascular disease    s/p right MCA cardioemoblic infarct 2/50/53  . CHF (congestive heart failure) ([place])   . Nonischemic cardiomyopathy (HCC)    a. +LMNA mutation b. s/p STJ dual chamber  ICD  . Stroke [facility]) 02/2008   no deficits   Past Surgical History:  Procedure Laterality Date  . ABDOMINAL HYSTERECTOMY    . BREAST IMPLANT REMOVAL Right 05/31/2015   Procedure: REMOVAL RIGHT  BREAST IMPLANTS;  Surgeon: Crissie Reese, MD;  Location: [place];  Service: Plastics;  Laterality: Right;  . BREAST RECONSTRUCTION Right 1995  . BREAST RECONSTRUCTION Right 05/31/2015   Procedure: RIGHT BREAST RECONSTRUCTION WITH SALINE IMPLANTS ;  Surgeon: Crissie Reese, MD;   Location: [place];  Service: Plastics;  Laterality: Right;  . CARDIAC DEFIBRILLATOR PLACEMENT  2010   STJ dual chamber ICD implanted for NICM by Dr Caryl Comes  . CARDIAC DEFIBRILLATOR PLACEMENT  05/26/2008  . CATARACT EXTRACTION W/PHACO Right 03/19/2018   Procedure: CATARACT EXTRACTION PHACO AND INTRAOCULAR LENS PLACEMENT ([place]);  Surgeon: Baruch Goldmann, MD;  Location[facility];  Service: Ophthalmology;  Laterality: Right;  CDE: 5.42  . CATARACT EXTRACTION W/PHACO Left 10/01/2018   Procedure: CATARACT EXTRACTION PHACO AND INTRAOCULAR LENS PLACEMENT ([facility]);  Surgeon: Baruch Goldmann, MD;  Location: [facility];  Service: Ophthalmology;  Laterality: Left;  left, CDE: 6.06  . CHOLECYSTECTOMY    . COLONOSCOPY    . EP IMPLANTABLE DEVICE N/A 08/06/2015   Procedure: ICD Generator Changeout;  Surgeon: Deboraha Sprang, MD;  Location: [place] [facility];  Service: Cardiovascular;  Laterality: N/A;  . ESOPHAGOGASTRODUODENOSCOPY    . MASTECTOMY Right   . MASTOPEXY Left 05/31/2015   Procedure: LEFT BREAST MASTOPEXY;  Surgeon: Crissie Reese, MD;  Location: [place];  Service: Plastics;  Laterality: Left;  . PACEMAKER INSERTION  05/2008  . TONSILLECTOMY    . VARICOSE VEIN SURGERY Left    leg     Current Meds  Medication Sig  . acetaminophen (TYLENOL) 325 MG tablet Take 2 tablets (650 mg total) by mouth every 6 (six) hours as needed for mild pain or headache (fever >/= 101).  Marland Kitchen ascorbic acid (VITAMIN C) 500 MG tablet Take 1 tablet (500 mg total) by mouth daily.  Marland Kitchen atorvastatin (LIPITOR) 20 MG tablet Take 1 tablet (20 mg total) by mouth daily.  Marland Kitchen BIOTIN PO Take 1 tablet by mouth daily.  . digoxin (LANOXIN) 0.125 MG tablet TAKE ONE TABLET BY MOUTH DAILY.  Marland Kitchen ENTRESTO 97-103 MG TAKE ONE TABLET BY MOUTH TWICE DAILY **EMERGENCY REFILL**  . furosemide (LASIX) 40 MG tablet Take 0.5 tablets (20 mg total) by mouth as needed for fluid or edema (q24 hrs as needed).  . metoprolol succinate (TOPROL-XL) 50 MG 24 hr tablet Take 50 mg by mouth  2 (two) times daily.  . Multiple Vitamin (MULTIVITAMIN WITH MINERALS) TABS tablet Take 1 tablet by mouth daily.  . nitrofurantoin (MACRODANTIN) 50 MG capsule Take 50 mg by mouth as needed.  . Omega-3 Fatty Acids (FISH OIL PO) Take 1 capsule by mouth daily.   Marland Kitchen spironolactone (ALDACTONE) 25 MG tablet TAKE 1/2 TABLET BY MOUTH DAILY.  Marland Kitchen warfarin (COUMADIN) 5 MG tablet TAKE 1/2 TO 1 TABLET BY MOUTH DAILY AS DIRECTED by COUMADIN CLINIC     Allergies:   Patient has no known allergies.   Social History   Tobacco Use  . Smoking status: Never Smoker  . Smokeless tobacco: Never Used  Vaping Use  . Vaping Use: Never used  Substance Use Topics  . Alcohol use: No  . Drug use: No     Family Hx: The patient's family history includes Cardiomyopathy in an other family member; Other in her brother; Stroke in an  other family member.  ROS:   Please see the history of present illness.     All other systems reviewed and are negative.   Prior CV studies:   The following studies were reviewed today:    Labs/Other Tests and Data Reviewed:    EKG:  An ECG dated 07/08/2019 was personally reviewed today and demonstrated:  Sinus rhythm, PACs  Recent Labs: 02/23/2019: ALT 55; BUN 19; Creatinine, Ser 0.61; Hemoglobin 12.1; Platelets 322; Potassium 3.9; Sodium 138   Recent Lipid Panel Lab Results  Component Value Date/Time   CHOL  03/10/2008 05:00 AM    128        ATP III CLASSIFICATION:  <200     mg/dL   Desirable  200-239  mg/dL   Borderline High  >=240    mg/dL   High          TRIG 167 (H) 02/19/2019 12:44 PM   HDL 33 (L) 03/10/2008 05:00 AM   CHOLHDL 3.9 03/10/2008 05:00 AM   LDLCALC  03/10/2008 05:00 AM    82        Total Cholesterol/HDL:CHD Risk Coronary Heart Disease Risk Table                     Men   Women  1/2 Average Risk   3.4   3.3  Average Risk       5.0   4.4  2 X Average Risk   9.6   7.1  3 X Average Risk  23.4   11.0        Use the calculated Patient Ratio above and  the CHD Risk Table to determine the patient's CHD Risk.        ATP III CLASSIFICATION (LDL):  <100     mg/dL   Optimal  100-129  mg/dL   Near or Above                    Optimal  130-159  mg/dL   Borderline  160-189  mg/dL   High  >190     mg/dL   Very High    Wt Readings from Last 3 Encounters:  11/25/19 161 lb 9.6 oz (73.3 kg)  09/12/19 163 lb 6.4 oz (74.1 kg)  07/08/19 164 lb (74.4 kg)     Risk Assessment/Calculations:      Objective:    Vital Signs:  BP 114/63   Pulse 63   Ht 5\' 8"  (1.727 m)   Wt 161 lb 9.6 oz (73.3 kg)   SpO2 94%   BMI 24.57 kg/m    VITAL SIGNS:  reviewed GEN:  no acute distress RESPIRATORY:  normal respiratory effort, no increased work of breathing NEURO:  alert and oriented x 3, speech normal PSYCH:  normal affect   ASSESSMENT & PLAN:    1. Dizziness   2. Medication management   3. Encounter for therapeutic drug monitoring   4. Dilated cardiomyopathy ([place])   5. ICD (implantable cardioverter-defibrillator) battery depletion   6. SYSTOLIC HEART FAILURE, CHRONIC    She describes dizziness and hypotension after as needed Lasix earlier this week.  I suspect she is volume deplete, contributing to her hypotension and positional dizziness.  She is on digoxin for heart failure, it would be prudent to check a digoxin level at this time as well as renal function and electrolytes given brisk diuresis on Monday and symptoms afterward.  We will check a BMP and a digoxin level  and I will contact the patient with the results.  I have recommended that she drink to thirst today, rest and hold off on taking any additional Lasix through the weekend unless weight gain, swelling, shortness of breath occur.  She will plan to keep her follow up appt with Dr. Percival Spanish on Nov 3.   COVID-19 Education: The signs and symptoms of COVID-19 were discussed with the patient and how to seek care for testing (follow up with PCP or arrange E-visit).  The importance of social  distancing was discussed today.  Time:   Today, I have spent 17 minutes with the patient with telehealth technology discussing the above problems.     Medication Adjustments/Labs and Tests Ordered: Current medicines are reviewed at length with the patient today.  Concerns regarding medicines are outlined above.   Tests Ordered: Orders Placed This Encounter  Procedures  . Basic metabolic panel  . Digoxin level    Medication Changes: No orders of the defined types were placed in this encounter.   Follow Up:  In Person 11/3  Signed, Elouise Munroe, MD  11/25/2019 8:06 AM    [place]

## 2019-11-25 NOTE — Telephone Encounter (Signed)
Patient called to advised that the remote transmission was received. Advised patient I do not see episdoes and a normal presenting rhythm. It will be reviewed in detail further and if we seen anything concerning we will call. Verbalized understanding.

## 2019-11-25 NOTE — Patient Instructions (Signed)
Medication Instructions:  No Changes In Medications at this time.  *If you need a refill on your cardiac medications before your next appointment, please call your pharmacy*  Lab Work: [facility] and [place]- will come in today for this  If you have labs (blood work) drawn today and your tests are completely normal, you will receive your results only by: Marland Kitchen MyChart Message (if you have MyChart) OR . A paper copy in the mail If you have any lab test that is abnormal or we need to change your treatment, we will call you to review the results.  Testing/Procedures: None Ordered At This Time.   Follow-Up: At [facility], you and your health needs are our priority.  As part of our continuing mission to provide you with exceptional heart care, we have created designated Provider Care Teams.  These Care Teams include your primary Cardiologist (physician) and Advanced Practice Providers (APPs -  Physician Assistants and Nurse Practitioners) who all work together to provide you with the care you need, when you need it.  Your next appointment:   KEEP APPOINTMENT WITH Dr. Percival Spanish IN NOVEMBER  The format for your next appointment:   In Person  Provider:   Minus Breeding, MD

## 2019-11-26 LAB — CUP PACEART REMOTE DEVICE CHECK
Battery Remaining Longevity: 60 mo
Battery Remaining Percentage: 62 %
Battery Voltage: 2.95 V
Brady Statistic AP VP Percent: 1 %
Brady Statistic AP VS Percent: 21 %
Brady Statistic AS VP Percent: 1 %
Brady Statistic AS VS Percent: 78 %
Brady Statistic RA Percent Paced: 21 %
Brady Statistic RV Percent Paced: 1 %
Date Time Interrogation Session: 20211014171749
HighPow Impedance: 73 Ohm
HighPow Impedance: 73 Ohm
Implantable Lead Implant Date: 20100416
Implantable Lead Implant Date: 20100416
Implantable Lead Location: 753859
Implantable Lead Location: 753860
Implantable Lead Model: 7122
Implantable Pulse Generator Implant Date: 20170626
Lead Channel Impedance Value: 450 Ohm
Lead Channel Impedance Value: 510 Ohm
Lead Channel Pacing Threshold Amplitude: 0.5 V
Lead Channel Pacing Threshold Amplitude: 1 V
Lead Channel Pacing Threshold Pulse Width: 0.5 ms
Lead Channel Pacing Threshold Pulse Width: 0.5 ms
Lead Channel Sensing Intrinsic Amplitude: 1.6 mV
Lead Channel Sensing Intrinsic Amplitude: 12 mV
Lead Channel Setting Pacing Amplitude: 2 V
Lead Channel Setting Pacing Amplitude: 2.5 V
Lead Channel Setting Pacing Pulse Width: 0.5 ms
Lead Channel Setting Sensing Sensitivity: 0.5 mV
Pulse Gen Serial Number: 7370130

## 2019-11-26 LAB — BASIC METABOLIC PANEL
BUN/Creatinine Ratio: 32 — ABNORMAL HIGH (ref 12–28)
BUN: 33 mg/dL — ABNORMAL HIGH (ref 8–27)
CO2: 26 mmol/L (ref 20–29)
Calcium: 9.8 mg/dL (ref 8.7–10.3)
Chloride: 100 mmol/L (ref 96–106)
Creatinine, Ser: 1.04 mg/dL — ABNORMAL HIGH (ref 0.57–1.00)
GFR calc Af Amer: 64 mL/min/{1.73_m2} (ref >59)
GFR calc non Af Amer: 55 mL/min/{1.73_m2} — ABNORMAL LOW (ref >59)
Glucose: 170 mg/dL — ABNORMAL HIGH (ref 65–99)
Potassium: 4.4 mmol/L (ref 3.5–5.2)
Sodium: 141 mmol/L (ref 134–144)

## 2019-11-26 LAB — DIGOXIN LEVEL: Digoxin, Serum: 0.6 ng/mL (ref 0.5–0.9)

## 2019-11-29 NOTE — Progress Notes (Signed)
Remote ICD transmission.   

## 2019-12-12 ENCOUNTER — Ambulatory Visit (INDEPENDENT_AMBULATORY_CARE_PROVIDER_SITE_OTHER): Payer: Medicare Other | Admitting: *Deleted

## 2019-12-12 DIAGNOSIS — M810 Age-related osteoporosis without current pathological fracture: Secondary | ICD-10-CM | POA: Diagnosis not present

## 2019-12-12 DIAGNOSIS — I6350 Cerebral infarction due to unspecified occlusion or stenosis of unspecified cerebral artery: Secondary | ICD-10-CM

## 2019-12-12 DIAGNOSIS — Z5181 Encounter for therapeutic drug level monitoring: Secondary | ICD-10-CM

## 2019-12-12 LAB — POCT INR: INR: 3 (ref 2.0–3.0)

## 2019-12-12 NOTE — Patient Instructions (Signed)
Continue warfarin 1/2 tablet daily except 1 tablet on Mondays and Thursdays Recheck in 4 weeks

## 2019-12-12 NOTE — Progress Notes (Signed)
Cardiology Office Note   Date:  12/14/2019   ID:  [name], [name] 03/18/1950, MRN [id number]  PCP:  Caryl Bis, MD  Cardiologist:   No primary care provider on file.    Chief Complaint  Patient presents with  . Dizziness     History of Present Illness: [name] [name] is a 69 y.o. female who presents for follow up of cardiomyopathy.  She has a history of a nonischemic cardiomyopathy. Her ejection fraction was at one point in time 15 - 20%. A later ejection fraction was about 40% in 2015.   Most recently the EF was 25% again.   I sent her for a CPX which demonstrated a good VO2 Max.    Since I last saw her she had a telehealth visit with Dr. Margaretann Loveless because she had dizziness.  She was thought to be probably volume depleted.  Since that time she has done well. The patient denies any new symptoms such as chest discomfort, neck or arm discomfort. There has been no new shortness of breath, PND or orthopnea. There have been no reported palpitations, presyncope or syncope.   Past Medical History:  Diagnosis Date  . Arthritis    thumbs  . Breast cancer ([place])    right  . Cerebrovascular disease    s/p right MCA cardioemoblic infarct 08/07/29  . CHF (congestive heart failure) ([place])   . Nonischemic cardiomyopathy (HCC)    a. +LMNA mutation b. s/p STJ dual chamber ICD  . Stroke [facility]) 02/2008   no deficits    Past Surgical History:  Procedure Laterality Date  . ABDOMINAL HYSTERECTOMY    . BREAST IMPLANT REMOVAL Right 05/31/2015   Procedure: REMOVAL RIGHT  BREAST IMPLANTS;  Surgeon: Crissie Reese, MD;  Location: [place];  Service: Plastics;  Laterality: Right;  . BREAST RECONSTRUCTION Right 1995  . BREAST RECONSTRUCTION Right 05/31/2015   Procedure: RIGHT BREAST RECONSTRUCTION WITH SALINE IMPLANTS ;  Surgeon: Crissie Reese, MD;  Location: [place];  Service: Plastics;  Laterality: Right;  . CARDIAC DEFIBRILLATOR PLACEMENT  2010   STJ dual chamber ICD implanted for NICM by Dr  Caryl Comes  . CARDIAC DEFIBRILLATOR PLACEMENT  05/26/2008  . CATARACT EXTRACTION W/PHACO Right 03/19/2018   Procedure: CATARACT EXTRACTION PHACO AND INTRAOCULAR LENS PLACEMENT ([place]);  Surgeon: Baruch Goldmann, MD;  [facility];  Service: Ophthalmology;  Laterality: Right;  CDE: 5.42  . CATARACT EXTRACTION W/PHACO Left 10/01/2018   Procedure: CATARACT EXTRACTION PHACO AND INTRAOCULAR LENS PLACEMENT ([facility]);  Surgeon: Baruch Goldmann, MD;  [facility];  Service: Ophthalmology;  Laterality: Left;  left, CDE: 6.06  . CHOLECYSTECTOMY    . COLONOSCOPY    . EP IMPLANTABLE DEVICE N/A 08/06/2015   Procedure: ICD Generator Changeout;  Surgeon: Deboraha Sprang, MD;  Location: [place] [facility];  Service: Cardiovascular;  Laterality: N/A;  . ESOPHAGOGASTRODUODENOSCOPY    . MASTECTOMY Right   . MASTOPEXY Left 05/31/2015   Procedure: LEFT BREAST MASTOPEXY;  Surgeon: Crissie Reese, MD;  Location: [place];  Service: Plastics;  Laterality: Left;  . PACEMAKER INSERTION  05/2008  . TONSILLECTOMY    . VARICOSE VEIN SURGERY Left    leg     Current Outpatient Medications  Medication Sig Dispense Refill  . ascorbic acid (VITAMIN C) 500 MG tablet Take 1 tablet (500 mg total) by mouth daily. 30 tablet 2  . atorvastatin (LIPITOR) 20 MG tablet Take 1 tablet (20 mg total) by mouth daily. 30 tablet  2  . BIOTIN PO Take 1 tablet by mouth daily.    . digoxin (LANOXIN) 0.125 MG tablet TAKE ONE TABLET BY MOUTH DAILY. 90 tablet 2  . ENTRESTO 97-103 MG TAKE ONE TABLET BY MOUTH TWICE DAILY **EMERGENCY REFILL** 60 tablet 11  . furosemide (LASIX) 40 MG tablet Take 0.5 tablets (20 mg total) by mouth as needed for fluid or edema (q24 hrs as needed). 90 tablet 3  . metoprolol succinate (TOPROL-XL) 50 MG 24 hr tablet Take 50 mg by mouth 2 (two) times daily.    . Multiple Vitamin (MULTIVITAMIN WITH MINERALS) TABS tablet Take 1 tablet by mouth daily.    . nitrofurantoin (MACRODANTIN) 50 MG capsule Take 50 mg by mouth as needed.    .  Omega-3 Fatty Acids (FISH OIL PO) Take 1 capsule by mouth daily.     Marland Kitchen spironolactone (ALDACTONE) 25 MG tablet TAKE 1/2 TABLET BY MOUTH DAILY. 45 tablet 3  . warfarin (COUMADIN) 5 MG tablet TAKE 1/2 TO 1 TABLET BY MOUTH DAILY AS DIRECTED by COUMADIN CLINIC 90 tablet 0   No current facility-administered medications for this visit.    Allergies:   Patient has no known allergies.   ROS:  Please see the history of present illness.   Otherwise, review of systems are positive for none.   All other systems are reviewed and negative.    PHYSICAL EXAM: VS:  BP 96/60 (BP Location: Left Arm, Patient Position: Sitting)   Pulse (!) 52   Ht 5\' 8"  (1.727 m)   Wt 168 lb (76.2 kg)   SpO2 96%   BMI 25.54 kg/m  , BMI Body mass index is 25.54 kg/m.  GENERAL:  Well appearing NECK:  No jugular venous distention, waveform within normal limits, carotid upstroke brisk and symmetric, no bruits, no thyromegaly LUNGS:  Clear to auscultation bilaterally CHEST: Well-healed ICD pocket HEART:  PMI not displaced or sustained,S1 and S2 within normal limits, no S3, no S4, no clicks, no rubs, no murmurs ABD:  Flat, positive bowel sounds normal in frequency in pitch, no bruits, no rebound, no guarding, no midline pulsatile mass, no hepatomegaly, no splenomegaly EXT:  2 plus pulses throughout, no edema, no cyanosis no clubbing   EKG:  EKG is ordered today. Atrial paced rhythm with ventricular tracking, first-degree AV block, no acute ST-T wave changes.   Recent Labs: 02/23/2019: ALT 55; Hemoglobin 12.1; Platelets 322 11/25/2019: BUN 33; Creatinine, Ser 1.04; Potassium 4.4; Sodium 141    Lipid Panel    Component Value Date/Time   CHOL  03/10/2008 0500    128        ATP III CLASSIFICATION:  <200     mg/dL   Desirable  200-239  mg/dL   Borderline High  >=240    mg/dL   High          TRIG 167 (H) 02/19/2019 1244   HDL 33 (L) 03/10/2008 0500   CHOLHDL 3.9 03/10/2008 0500   VLDL 13 03/10/2008 0500   LDLCALC   03/10/2008 0500    82        Total Cholesterol/HDL:CHD Risk Coronary Heart Disease Risk Table                     Men   Women  1/2 Average Risk   3.4   3.3  Average Risk       5.0   4.4  2 X Average Risk   9.6   7.1  3 X Average Risk  23.4   11.0        Use the calculated Patient Ratio above and the CHD Risk Table to determine the patient's CHD Risk.        ATP III CLASSIFICATION (LDL):  <100     mg/dL   Optimal  100-129  mg/dL   Near or Above                    Optimal  130-159  mg/dL   Borderline  160-189  mg/dL   High  >190     mg/dL   Very High      Wt Readings from Last 3 Encounters:  12/14/19 168 lb (76.2 kg)  11/25/19 161 lb 9.6 oz (73.3 kg)  09/12/19 163 lb 6.4 oz (74.1 kg)      Other studies Reviewed: Additional studies/ records that were reviewed today include: Labs Review of the above records demonstrates:  Please see elsewhere in the note.     ASSESSMENT AND PLAN:  CARDIOMYOPATHY, PRIMARY, DILATED - The patient is done well.  The dizziness is abated.  No change in therapy.   We did talk at length about the implications of screening for her son who has yet to have any genetic testing or echocardiography.  ICD -  She is up-to-date with follow-up and will arrange follow-up with Dr. Caryl Comes.  CVA -  She has no significant residual from this.  No change in therapy.   Current medicines are reviewed at length with the patient today.  The patient does not have concerns regarding medicines.  The following changes have been made: None Labs/ tests ordered today include: None  Orders Placed This Encounter  Procedures  . EKG 12-Lead     Disposition:   FU with me in 1 year.  Signed, Minus Breeding, MD  12/14/2019 11:15 AM    [place]

## 2019-12-14 ENCOUNTER — Ambulatory Visit: Payer: Medicare Other | Admitting: Cardiology

## 2019-12-14 ENCOUNTER — Encounter: Payer: Self-pay | Admitting: Cardiology

## 2019-12-14 ENCOUNTER — Other Ambulatory Visit: Payer: Self-pay

## 2019-12-14 VITALS — BP 96/60 | HR 52 | Ht 68.0 in | Wt 168.0 lb

## 2019-12-14 DIAGNOSIS — I5022 Chronic systolic (congestive) heart failure: Secondary | ICD-10-CM

## 2019-12-14 DIAGNOSIS — I9589 Other hypotension: Secondary | ICD-10-CM

## 2019-12-14 DIAGNOSIS — Z95810 Presence of automatic (implantable) cardiac defibrillator: Secondary | ICD-10-CM

## 2019-12-14 NOTE — Patient Instructions (Signed)
Medication Instructions:  No changes *If you need a refill on your cardiac medications before your next appointment, please call your pharmacy*   Lab Work: Not needed    Testing/Procedures: not neede   Follow-Up: At [facility], you and your health needs are our priority.  As part of our continuing mission to provide you with exceptional heart care, we have created designated Provider Care Teams.  These Care Teams include your primary Cardiologist (physician) and Advanced Practice Providers (APPs -  Physician Assistants and Nurse Practitioners) who all work together to provide you with the care you need, when you need it.    Your next appointment:   12 month(s)  The format for your next appointment:   In Person  Provider:   Minus Breeding, MD

## 2019-12-20 DIAGNOSIS — E876 Hypokalemia: Secondary | ICD-10-CM | POA: Diagnosis not present

## 2019-12-20 DIAGNOSIS — Z1159 Encounter for screening for other viral diseases: Secondary | ICD-10-CM | POA: Diagnosis not present

## 2019-12-20 DIAGNOSIS — R7301 Impaired fasting glucose: Secondary | ICD-10-CM | POA: Diagnosis not present

## 2019-12-20 DIAGNOSIS — I10 Essential (primary) hypertension: Secondary | ICD-10-CM | POA: Diagnosis not present

## 2019-12-20 DIAGNOSIS — E782 Mixed hyperlipidemia: Secondary | ICD-10-CM | POA: Diagnosis not present

## 2019-12-20 DIAGNOSIS — R739 Hyperglycemia, unspecified: Secondary | ICD-10-CM | POA: Diagnosis not present

## 2019-12-23 DIAGNOSIS — E782 Mixed hyperlipidemia: Secondary | ICD-10-CM | POA: Diagnosis not present

## 2019-12-23 DIAGNOSIS — I5022 Chronic systolic (congestive) heart failure: Secondary | ICD-10-CM | POA: Diagnosis not present

## 2019-12-23 DIAGNOSIS — E876 Hypokalemia: Secondary | ICD-10-CM | POA: Diagnosis not present

## 2019-12-23 DIAGNOSIS — R7301 Impaired fasting glucose: Secondary | ICD-10-CM | POA: Diagnosis not present

## 2019-12-23 DIAGNOSIS — Z23 Encounter for immunization: Secondary | ICD-10-CM | POA: Diagnosis not present

## 2020-01-03 DIAGNOSIS — R739 Hyperglycemia, unspecified: Secondary | ICD-10-CM | POA: Diagnosis not present

## 2020-01-03 DIAGNOSIS — E876 Hypokalemia: Secondary | ICD-10-CM | POA: Diagnosis not present

## 2020-01-03 DIAGNOSIS — I10 Essential (primary) hypertension: Secondary | ICD-10-CM | POA: Diagnosis not present

## 2020-01-12 ENCOUNTER — Ambulatory Visit (INDEPENDENT_AMBULATORY_CARE_PROVIDER_SITE_OTHER): Payer: Medicare Other | Admitting: *Deleted

## 2020-01-12 DIAGNOSIS — Z5181 Encounter for therapeutic drug level monitoring: Secondary | ICD-10-CM | POA: Diagnosis not present

## 2020-01-12 DIAGNOSIS — I6350 Cerebral infarction due to unspecified occlusion or stenosis of unspecified cerebral artery: Secondary | ICD-10-CM

## 2020-01-12 LAB — POCT INR: INR: 3.2 — AB (ref 2.0–3.0)

## 2020-01-12 NOTE — Patient Instructions (Signed)
Hold warfarin tonight then resume 1/2 tablet daily except 1 tablet on Mondays and Thursdays Recheck in 4 weeks

## 2020-02-13 ENCOUNTER — Other Ambulatory Visit: Payer: Self-pay

## 2020-02-13 ENCOUNTER — Other Ambulatory Visit: Payer: Self-pay | Admitting: Cardiology

## 2020-02-13 DIAGNOSIS — I5022 Chronic systolic (congestive) heart failure: Secondary | ICD-10-CM

## 2020-02-13 DIAGNOSIS — Z5181 Encounter for therapeutic drug level monitoring: Secondary | ICD-10-CM

## 2020-02-13 MED ORDER — WARFARIN SODIUM 5 MG PO TABS: ORAL_TABLET | ORAL | 0 refills | Status: DC

## 2020-02-13 MED ORDER — METOPROLOL SUCCINATE ER 50 MG PO TB24: 50.0000 mg | ORAL_TABLET | Freq: Two times a day (BID) | ORAL | 11 refills | Status: DC

## 2020-02-13 NOTE — Telephone Encounter (Signed)
PT CALLED IN FOR MEDICATION REFILL

## 2020-02-15 ENCOUNTER — Ambulatory Visit (INDEPENDENT_AMBULATORY_CARE_PROVIDER_SITE_OTHER): Payer: Medicare Other | Admitting: *Deleted

## 2020-02-15 DIAGNOSIS — I6350 Cerebral infarction due to unspecified occlusion or stenosis of unspecified cerebral artery: Secondary | ICD-10-CM

## 2020-02-15 DIAGNOSIS — Z5181 Encounter for therapeutic drug level monitoring: Secondary | ICD-10-CM | POA: Diagnosis not present

## 2020-02-15 DIAGNOSIS — Z8673 Personal history of transient ischemic attack (TIA), and cerebral infarction without residual deficits: Secondary | ICD-10-CM | POA: Diagnosis not present

## 2020-02-15 LAB — POCT INR: INR: 2.9 (ref 2.0–3.0)

## 2020-02-15 NOTE — Patient Instructions (Signed)
Continue warfarin 1/2 tablet daily except 1 tablet on Mondays and Thursdays Recheck in 5 weeks  

## 2020-02-23 ENCOUNTER — Ambulatory Visit (INDEPENDENT_AMBULATORY_CARE_PROVIDER_SITE_OTHER): Payer: Medicare Other

## 2020-02-23 DIAGNOSIS — I5022 Chronic systolic (congestive) heart failure: Secondary | ICD-10-CM | POA: Diagnosis not present

## 2020-02-23 DIAGNOSIS — I420 Dilated cardiomyopathy: Secondary | ICD-10-CM

## 2020-02-23 LAB — CUP PACEART REMOTE DEVICE CHECK
Battery Remaining Longevity: 58 mo
Battery Remaining Percentage: 59 %
Battery Voltage: 2.95 V
Brady Statistic AP VP Percent: 1 %
Brady Statistic AP VS Percent: 21 %
Brady Statistic AS VP Percent: 1 %
Brady Statistic AS VS Percent: 78 %
Brady Statistic RA Percent Paced: 21 %
Brady Statistic RV Percent Paced: 1 %
Date Time Interrogation Session: 20220113040035
HighPow Impedance: 65 Ohm
HighPow Impedance: 65 Ohm
Implantable Lead Implant Date: 20100416
Implantable Lead Implant Date: 20100416
Implantable Lead Location: 753859
Implantable Lead Location: 753860
Implantable Lead Model: 7122
Implantable Pulse Generator Implant Date: 20170626
Lead Channel Impedance Value: 430 Ohm
Lead Channel Impedance Value: 510 Ohm
Lead Channel Pacing Threshold Amplitude: 0.5 V
Lead Channel Pacing Threshold Amplitude: 1 V
Lead Channel Pacing Threshold Pulse Width: 0.5 ms
Lead Channel Pacing Threshold Pulse Width: 0.5 ms
Lead Channel Sensing Intrinsic Amplitude: 1.5 mV
Lead Channel Sensing Intrinsic Amplitude: 12 mV
Lead Channel Setting Pacing Amplitude: 2 V
Lead Channel Setting Pacing Amplitude: 2.5 V
Lead Channel Setting Pacing Pulse Width: 0.5 ms
Lead Channel Setting Sensing Sensitivity: 0.5 mV
Pulse Gen Serial Number: 7370130

## 2020-03-07 NOTE — Progress Notes (Signed)
Remote ICD transmission.   

## 2020-03-19 ENCOUNTER — Ambulatory Visit (INDEPENDENT_AMBULATORY_CARE_PROVIDER_SITE_OTHER): Payer: Medicare Other | Admitting: *Deleted

## 2020-03-19 DIAGNOSIS — I6350 Cerebral infarction due to unspecified occlusion or stenosis of unspecified cerebral artery: Secondary | ICD-10-CM

## 2020-03-19 DIAGNOSIS — Z5181 Encounter for therapeutic drug level monitoring: Secondary | ICD-10-CM | POA: Diagnosis not present

## 2020-03-19 LAB — POCT INR: INR: 2.8 (ref 2.0–3.0)

## 2020-03-19 NOTE — Patient Instructions (Signed)
Continue warfarin 1/2 tablet daily except 1 tablet on Mondays and Thursdays Recheck in 6 weeks

## 2020-04-09 DIAGNOSIS — I5022 Chronic systolic (congestive) heart failure: Secondary | ICD-10-CM | POA: Diagnosis not present

## 2020-04-09 DIAGNOSIS — R7301 Impaired fasting glucose: Secondary | ICD-10-CM | POA: Diagnosis not present

## 2020-04-09 DIAGNOSIS — E876 Hypokalemia: Secondary | ICD-10-CM | POA: Diagnosis not present

## 2020-04-09 DIAGNOSIS — E7849 Other hyperlipidemia: Secondary | ICD-10-CM | POA: Diagnosis not present

## 2020-05-01 ENCOUNTER — Ambulatory Visit (INDEPENDENT_AMBULATORY_CARE_PROVIDER_SITE_OTHER): Payer: Medicare Other | Admitting: *Deleted

## 2020-05-01 DIAGNOSIS — Z5181 Encounter for therapeutic drug level monitoring: Secondary | ICD-10-CM

## 2020-05-01 DIAGNOSIS — I6350 Cerebral infarction due to unspecified occlusion or stenosis of unspecified cerebral artery: Secondary | ICD-10-CM | POA: Diagnosis not present

## 2020-05-01 LAB — POCT INR: INR: 2.5 (ref 2.0–3.0)

## 2020-05-01 NOTE — Patient Instructions (Signed)
Continue warfarin 1/2 tablet daily except 1 tablet on Mondays and Thursdays Recheck in 6 weeks

## 2020-05-09 DIAGNOSIS — I5022 Chronic systolic (congestive) heart failure: Secondary | ICD-10-CM | POA: Diagnosis not present

## 2020-05-09 DIAGNOSIS — E876 Hypokalemia: Secondary | ICD-10-CM | POA: Diagnosis not present

## 2020-05-09 DIAGNOSIS — R7301 Impaired fasting glucose: Secondary | ICD-10-CM | POA: Diagnosis not present

## 2020-05-09 DIAGNOSIS — E7849 Other hyperlipidemia: Secondary | ICD-10-CM | POA: Diagnosis not present

## 2020-05-24 ENCOUNTER — Ambulatory Visit (INDEPENDENT_AMBULATORY_CARE_PROVIDER_SITE_OTHER): Payer: Medicare Other

## 2020-05-24 DIAGNOSIS — I420 Dilated cardiomyopathy: Secondary | ICD-10-CM | POA: Diagnosis not present

## 2020-05-24 DIAGNOSIS — I5022 Chronic systolic (congestive) heart failure: Secondary | ICD-10-CM

## 2020-05-24 LAB — CUP PACEART REMOTE DEVICE CHECK
Battery Remaining Longevity: 56 mo
Battery Remaining Percentage: 57 %
Battery Voltage: 2.93 V
Brady Statistic AP VP Percent: 1 %
Brady Statistic AP VS Percent: 21 %
Brady Statistic AS VP Percent: 1 %
Brady Statistic AS VS Percent: 78 %
Brady Statistic RA Percent Paced: 21 %
Brady Statistic RV Percent Paced: 1 %
Date Time Interrogation Session: 20220414040019
HighPow Impedance: 68 Ohm
HighPow Impedance: 68 Ohm
Implantable Lead Implant Date: 20100416
Implantable Lead Implant Date: 20100416
Implantable Lead Location: 753859
Implantable Lead Location: 753860
Implantable Lead Model: 7122
Implantable Pulse Generator Implant Date: 20170626
Lead Channel Impedance Value: 450 Ohm
Lead Channel Impedance Value: 460 Ohm
Lead Channel Pacing Threshold Amplitude: 0.5 V
Lead Channel Pacing Threshold Amplitude: 1 V
Lead Channel Pacing Threshold Pulse Width: 0.5 ms
Lead Channel Pacing Threshold Pulse Width: 0.5 ms
Lead Channel Sensing Intrinsic Amplitude: 1.5 mV
Lead Channel Sensing Intrinsic Amplitude: 11.9 mV
Lead Channel Setting Pacing Amplitude: 2 V
Lead Channel Setting Pacing Amplitude: 2.5 V
Lead Channel Setting Pacing Pulse Width: 0.5 ms
Lead Channel Setting Sensing Sensitivity: 0.5 mV
Pulse Gen Serial Number: 7370130

## 2020-06-04 DIAGNOSIS — D225 Melanocytic nevi of trunk: Secondary | ICD-10-CM | POA: Diagnosis not present

## 2020-06-04 DIAGNOSIS — L821 Other seborrheic keratosis: Secondary | ICD-10-CM | POA: Diagnosis not present

## 2020-06-04 DIAGNOSIS — L570 Actinic keratosis: Secondary | ICD-10-CM | POA: Diagnosis not present

## 2020-06-04 DIAGNOSIS — L820 Inflamed seborrheic keratosis: Secondary | ICD-10-CM | POA: Diagnosis not present

## 2020-06-04 DIAGNOSIS — B078 Other viral warts: Secondary | ICD-10-CM | POA: Diagnosis not present

## 2020-06-08 NOTE — Progress Notes (Signed)
Remote ICD transmission.   

## 2020-06-09 DIAGNOSIS — E876 Hypokalemia: Secondary | ICD-10-CM | POA: Diagnosis not present

## 2020-06-09 DIAGNOSIS — E7849 Other hyperlipidemia: Secondary | ICD-10-CM | POA: Diagnosis not present

## 2020-06-09 DIAGNOSIS — R7301 Impaired fasting glucose: Secondary | ICD-10-CM | POA: Diagnosis not present

## 2020-06-09 DIAGNOSIS — I5022 Chronic systolic (congestive) heart failure: Secondary | ICD-10-CM | POA: Diagnosis not present

## 2020-06-12 ENCOUNTER — Ambulatory Visit (INDEPENDENT_AMBULATORY_CARE_PROVIDER_SITE_OTHER): Payer: Medicare Other | Admitting: *Deleted

## 2020-06-12 DIAGNOSIS — Z5181 Encounter for therapeutic drug level monitoring: Secondary | ICD-10-CM

## 2020-06-12 DIAGNOSIS — I6350 Cerebral infarction due to unspecified occlusion or stenosis of unspecified cerebral artery: Secondary | ICD-10-CM | POA: Diagnosis not present

## 2020-06-12 LAB — POCT INR: INR: 2.2 (ref 2.0–3.0)

## 2020-06-12 NOTE — Patient Instructions (Signed)
Continue warfarin 1/2 tablet daily except 1 tablet on Mondays and Thursdays Recheck in 6 weeks

## 2020-06-15 DIAGNOSIS — R7301 Impaired fasting glucose: Secondary | ICD-10-CM | POA: Diagnosis not present

## 2020-06-15 DIAGNOSIS — E7849 Other hyperlipidemia: Secondary | ICD-10-CM | POA: Diagnosis not present

## 2020-06-15 DIAGNOSIS — Z0001 Encounter for general adult medical examination with abnormal findings: Secondary | ICD-10-CM | POA: Diagnosis not present

## 2020-06-15 DIAGNOSIS — R739 Hyperglycemia, unspecified: Secondary | ICD-10-CM | POA: Diagnosis not present

## 2020-06-15 DIAGNOSIS — E876 Hypokalemia: Secondary | ICD-10-CM | POA: Diagnosis not present

## 2020-06-15 DIAGNOSIS — E782 Mixed hyperlipidemia: Secondary | ICD-10-CM | POA: Diagnosis not present

## 2020-06-15 DIAGNOSIS — I10 Essential (primary) hypertension: Secondary | ICD-10-CM | POA: Diagnosis not present

## 2020-06-20 ENCOUNTER — Encounter: Payer: Self-pay | Admitting: Internal Medicine

## 2020-06-20 ENCOUNTER — Ambulatory Visit: Payer: Medicare Other | Admitting: Internal Medicine

## 2020-06-20 ENCOUNTER — Other Ambulatory Visit: Payer: Self-pay

## 2020-06-20 VITALS — BP 98/60 | HR 57 | Ht 68.0 in | Wt 167.6 lb

## 2020-06-20 DIAGNOSIS — I420 Dilated cardiomyopathy: Secondary | ICD-10-CM | POA: Diagnosis not present

## 2020-06-20 DIAGNOSIS — Z95810 Presence of automatic (implantable) cardiac defibrillator: Secondary | ICD-10-CM | POA: Diagnosis not present

## 2020-06-20 DIAGNOSIS — I5022 Chronic systolic (congestive) heart failure: Secondary | ICD-10-CM

## 2020-06-20 NOTE — Patient Instructions (Signed)

## 2020-06-20 NOTE — Progress Notes (Signed)
Patient Care Team: Caryl Bis, MD as PCP - General (Family Medicine)   HPI  [name] [name] is a 70 y.o. female Seen in followup for ICD [employer]  implanted for primary prevention in the setting of nonischemic heart disease + for LAMIN gene mutation.  June 2017 underwent device generator replacement.  COVID January 2021 and then vaccinated with rxn ( #1&2)     Functional status is significantly improved.  Mild dyspnea noted at the top of his flight of stairs or 2 otherwise not.  No chest pain.  Some peripheral edema which when accumulating is associated with worsening dyspnea.  No nocturnal dyspnea or orthopnea.  No palpitations.  Tolerating medications.   Date Cr K Dig Hgb  1/21 0.83 3.9     10/21 1.01 4.4 0.6 12.    DATE TEST EF   12/13 Echo   35-40 %   2/21 Echo   40-45 %          Past Medical History:  Diagnosis Date  . Arthritis    thumbs  . Breast cancer ([place])    right  . Cerebrovascular disease    s/p right MCA cardioemoblic infarct 10/09/54  . CHF (congestive heart failure) ([place])   . Nonischemic cardiomyopathy (HCC)    a. +LMNA mutation b. s/p STJ dual chamber ICD  . Stroke [facility]) 02/2008   no deficits    Past Surgical History:  Procedure Laterality Date  . ABDOMINAL HYSTERECTOMY    . BREAST IMPLANT REMOVAL Right 05/31/2015   Procedure: REMOVAL RIGHT  BREAST IMPLANTS;  Surgeon: Crissie Reese, MD;  Location: [place];  Service: Plastics;  Laterality: Right;  . BREAST RECONSTRUCTION Right 1995  . BREAST RECONSTRUCTION Right 05/31/2015   Procedure: RIGHT BREAST RECONSTRUCTION WITH SALINE IMPLANTS ;  Surgeon: Crissie Reese, MD;  Location: [place];  Service: Plastics;  Laterality: Right;  . CARDIAC DEFIBRILLATOR PLACEMENT  2010   STJ dual chamber ICD implanted for NICM by Dr Caryl Comes  . CARDIAC DEFIBRILLATOR PLACEMENT  05/26/2008  . CATARACT EXTRACTION W/PHACO Right 03/19/2018   Procedure: CATARACT EXTRACTION PHACO AND INTRAOCULAR LENS PLACEMENT ([place]);  Surgeon:  Baruch Goldmann, MD;  [facility]: [facility];  Service: Ophthalmology;  Laterality: Right;  CDE: 5.42  . CATARACT EXTRACTION W/PHACO Left 10/01/2018   Procedure: CATARACT EXTRACTION PHACO AND INTRAOCULAR LENS PLACEMENT ([facility]);  Surgeon: Baruch Goldmann, MD;  [facility];  Service: Ophthalmology;  Laterality: Left;  left, CDE: 6.06  . CHOLECYSTECTOMY    . COLONOSCOPY    . EP IMPLANTABLE DEVICE N/A 08/06/2015   Procedure: ICD Generator Changeout;  Surgeon: Deboraha Sprang, MD;  Location: [place] [facility];  Service: Cardiovascular;  Laterality: N/A;  . ESOPHAGOGASTRODUODENOSCOPY    . MASTECTOMY Right   . MASTOPEXY Left 05/31/2015   Procedure: LEFT BREAST MASTOPEXY;  Surgeon: Crissie Reese, MD;  Location: [place];  Service: Plastics;  Laterality: Left;  . PACEMAKER INSERTION  05/2008  . TONSILLECTOMY    . VARICOSE VEIN SURGERY Left    leg    Current Outpatient Medications  Medication Sig Dispense Refill  . ascorbic acid (VITAMIN C) 500 MG tablet Take 1 tablet (500 mg total) by mouth daily. 30 tablet 2  . atorvastatin (LIPITOR) 20 MG tablet Take 1 tablet (20 mg total) by mouth daily. 30 tablet 2  . BIOTIN PO Take 1 tablet by mouth daily.    . digoxin (LANOXIN) 0.125 MG tablet TAKE ONE TABLET BY MOUTH  DAILY. 90 tablet 2  . ENTRESTO 97-103 MG TAKE ONE TABLET BY MOUTH TWICE DAILY **EMERGENCY REFILL** 60 tablet 11  . furosemide (LASIX) 40 MG tablet Take 0.5 tablets (20 mg total) by mouth as needed for fluid or edema (q24 hrs as needed). 90 tablet 3  . metoprolol succinate (TOPROL-XL) 50 MG 24 hr tablet Take 1 tablet (50 mg total) by mouth 2 (two) times daily. 60 tablet 11  . Multiple Vitamin (MULTIVITAMIN WITH MINERALS) TABS tablet Take 1 tablet by mouth daily.    . nitrofurantoin (MACRODANTIN) 50 MG capsule Take 50 mg by mouth as needed.    . Omega-3 Fatty Acids (FISH OIL PO) Take 1 capsule by mouth daily.     Marland Kitchen spironolactone (ALDACTONE) 25 MG tablet TAKE 1/2 TABLET BY MOUTH DAILY. 45 tablet 3  .  warfarin (COUMADIN) 5 MG tablet TAKE 1/2 TO 1 TABLET BY MOUTH DAILY AS DIRECTED by COUMADIN CLINIC 90 tablet 0   No current facility-administered medications for this visit.    No Known Allergies  Review of Systems negative except from HPI and PMH  Physical Exam BP 98/60   Pulse (!) 57   Ht 5\' 8"  (1.727 m)   Wt 167 lb 9.6 oz (76 kg)   SpO2 93%   BMI 25.48 kg/m  Well developed and well nourished in no acute distress HENT normal Neck supple with JVP-flat Clear Device pocket well healed; without hematoma or erythema.  There is no tethering  Regular rate and rhythm, no  murmur Abd-soft with active BS No Clubbing cyanosis  edema Skin-warm and dry A & Oriented  Grossly normal sensory and motor function  ECG sinus @ 57 23/13/43 NSTT    Assessment and  Plan  DCM Lamin  ICD The patient's device was interrogated.  The information was reviewed. No changes were made in the programming.     Fam hx of + gene mutation Lamin Cardiomyopathy  High risk medication surveillance  VTNS  Euvolemic continue current meds  No intercurrent ventricular tachycardia  Concerned about traveling accumulating fluid and diuresing while on the airplane.  Suggested that she take her diuretics daily on the 2 days before flying and decreased p.o. intake

## 2020-07-06 DIAGNOSIS — Z0001 Encounter for general adult medical examination with abnormal findings: Secondary | ICD-10-CM | POA: Diagnosis not present

## 2020-07-06 DIAGNOSIS — I420 Dilated cardiomyopathy: Secondary | ICD-10-CM | POA: Diagnosis not present

## 2020-07-06 DIAGNOSIS — I5022 Chronic systolic (congestive) heart failure: Secondary | ICD-10-CM | POA: Diagnosis not present

## 2020-07-06 DIAGNOSIS — E7849 Other hyperlipidemia: Secondary | ICD-10-CM | POA: Diagnosis not present

## 2020-07-06 DIAGNOSIS — I10 Essential (primary) hypertension: Secondary | ICD-10-CM | POA: Diagnosis not present

## 2020-07-09 DIAGNOSIS — R7301 Impaired fasting glucose: Secondary | ICD-10-CM | POA: Diagnosis not present

## 2020-07-09 DIAGNOSIS — E7849 Other hyperlipidemia: Secondary | ICD-10-CM | POA: Diagnosis not present

## 2020-07-09 DIAGNOSIS — I5022 Chronic systolic (congestive) heart failure: Secondary | ICD-10-CM | POA: Diagnosis not present

## 2020-07-09 DIAGNOSIS — E876 Hypokalemia: Secondary | ICD-10-CM | POA: Diagnosis not present

## 2020-07-19 DIAGNOSIS — L821 Other seborrheic keratosis: Secondary | ICD-10-CM | POA: Diagnosis not present

## 2020-07-19 DIAGNOSIS — L578 Other skin changes due to chronic exposure to nonionizing radiation: Secondary | ICD-10-CM | POA: Diagnosis not present

## 2020-07-24 ENCOUNTER — Ambulatory Visit: Payer: Medicare Other | Admitting: *Deleted

## 2020-07-24 ENCOUNTER — Encounter: Payer: Medicare Other | Admitting: Internal Medicine

## 2020-07-24 DIAGNOSIS — Z5181 Encounter for therapeutic drug level monitoring: Secondary | ICD-10-CM | POA: Diagnosis not present

## 2020-07-24 DIAGNOSIS — I6350 Cerebral infarction due to unspecified occlusion or stenosis of unspecified cerebral artery: Secondary | ICD-10-CM

## 2020-07-24 LAB — POCT INR: INR: 2.4 (ref 2.0–3.0)

## 2020-07-24 NOTE — Patient Instructions (Signed)
Continue warfarin 1/2 tablet daily except 1 tablet on Mondays and Thursdays Recheck in 6 weeks

## 2020-07-30 ENCOUNTER — Other Ambulatory Visit: Payer: Self-pay | Admitting: Internal Medicine

## 2020-07-30 DIAGNOSIS — Z5181 Encounter for therapeutic drug level monitoring: Secondary | ICD-10-CM

## 2020-08-23 ENCOUNTER — Ambulatory Visit (INDEPENDENT_AMBULATORY_CARE_PROVIDER_SITE_OTHER): Payer: Medicare Other

## 2020-08-23 ENCOUNTER — Other Ambulatory Visit: Payer: Self-pay | Admitting: Cardiovascular Disease

## 2020-08-23 DIAGNOSIS — I5022 Chronic systolic (congestive) heart failure: Secondary | ICD-10-CM | POA: Diagnosis not present

## 2020-08-23 LAB — CUP PACEART REMOTE DEVICE CHECK
Battery Remaining Longevity: 53 mo
Battery Remaining Percentage: 55 %
Battery Voltage: 2.93 V
Brady Statistic AP VP Percent: 1 %
Brady Statistic AP VS Percent: 16 %
Brady Statistic AS VP Percent: 1 %
Brady Statistic AS VS Percent: 83 %
Brady Statistic RA Percent Paced: 16 %
Brady Statistic RV Percent Paced: 1 %
Date Time Interrogation Session: 20220714040019
HighPow Impedance: 64 Ohm
HighPow Impedance: 64 Ohm
Implantable Lead Implant Date: 20100416
Implantable Lead Implant Date: 20100416
Implantable Lead Location: 753859
Implantable Lead Location: 753860
Implantable Lead Model: 7122
Implantable Pulse Generator Implant Date: 20170626
Lead Channel Impedance Value: 390 Ohm
Lead Channel Impedance Value: 400 Ohm
Lead Channel Pacing Threshold Amplitude: 0.75 V
Lead Channel Pacing Threshold Amplitude: 1 V
Lead Channel Pacing Threshold Pulse Width: 0.5 ms
Lead Channel Pacing Threshold Pulse Width: 0.5 ms
Lead Channel Sensing Intrinsic Amplitude: 1.5 mV
Lead Channel Sensing Intrinsic Amplitude: 11.7 mV
Lead Channel Setting Pacing Amplitude: 2 V
Lead Channel Setting Pacing Amplitude: 2.5 V
Lead Channel Setting Pacing Pulse Width: 0.5 ms
Lead Channel Setting Sensing Sensitivity: 0.5 mV
Pulse Gen Serial Number: 7370130

## 2020-09-04 ENCOUNTER — Other Ambulatory Visit: Payer: Self-pay

## 2020-09-04 ENCOUNTER — Ambulatory Visit: Payer: Medicare Other | Admitting: *Deleted

## 2020-09-04 DIAGNOSIS — Z5181 Encounter for therapeutic drug level monitoring: Secondary | ICD-10-CM

## 2020-09-04 DIAGNOSIS — I6350 Cerebral infarction due to unspecified occlusion or stenosis of unspecified cerebral artery: Secondary | ICD-10-CM | POA: Diagnosis not present

## 2020-09-04 LAB — POCT INR: INR: 2.2 (ref 2.0–3.0)

## 2020-09-04 NOTE — Patient Instructions (Signed)
Continue warfarin 1/2 tablet daily except 1 tablet on Mondays and Thursdays Recheck in 6 weeks

## 2020-09-09 DIAGNOSIS — I5022 Chronic systolic (congestive) heart failure: Secondary | ICD-10-CM | POA: Diagnosis not present

## 2020-09-09 DIAGNOSIS — E876 Hypokalemia: Secondary | ICD-10-CM | POA: Diagnosis not present

## 2020-09-09 DIAGNOSIS — E7849 Other hyperlipidemia: Secondary | ICD-10-CM | POA: Diagnosis not present

## 2020-09-09 DIAGNOSIS — R7301 Impaired fasting glucose: Secondary | ICD-10-CM | POA: Diagnosis not present

## 2020-09-15 NOTE — Progress Notes (Signed)
Remote ICD transmission.   

## 2020-10-01 DIAGNOSIS — Z0001 Encounter for general adult medical examination with abnormal findings: Secondary | ICD-10-CM | POA: Diagnosis not present

## 2020-10-01 DIAGNOSIS — E876 Hypokalemia: Secondary | ICD-10-CM | POA: Diagnosis not present

## 2020-10-01 DIAGNOSIS — E7849 Other hyperlipidemia: Secondary | ICD-10-CM | POA: Diagnosis not present

## 2020-10-01 DIAGNOSIS — I5022 Chronic systolic (congestive) heart failure: Secondary | ICD-10-CM | POA: Diagnosis not present

## 2020-10-01 DIAGNOSIS — R7301 Impaired fasting glucose: Secondary | ICD-10-CM | POA: Diagnosis not present

## 2020-10-01 DIAGNOSIS — E782 Mixed hyperlipidemia: Secondary | ICD-10-CM | POA: Diagnosis not present

## 2020-10-01 DIAGNOSIS — D6869 Other thrombophilia: Secondary | ICD-10-CM | POA: Diagnosis not present

## 2020-10-01 DIAGNOSIS — I420 Dilated cardiomyopathy: Secondary | ICD-10-CM | POA: Diagnosis not present

## 2020-10-01 DIAGNOSIS — Z23 Encounter for immunization: Secondary | ICD-10-CM | POA: Diagnosis not present

## 2020-10-17 ENCOUNTER — Ambulatory Visit (INDEPENDENT_AMBULATORY_CARE_PROVIDER_SITE_OTHER): Payer: Medicare Other | Admitting: *Deleted

## 2020-10-17 DIAGNOSIS — I6350 Cerebral infarction due to unspecified occlusion or stenosis of unspecified cerebral artery: Secondary | ICD-10-CM

## 2020-10-17 DIAGNOSIS — Z5181 Encounter for therapeutic drug level monitoring: Secondary | ICD-10-CM | POA: Diagnosis not present

## 2020-10-17 LAB — POCT INR: INR: 2.7 (ref 2.0–3.0)

## 2020-10-17 NOTE — Patient Instructions (Signed)
Continue warfarin 1/2 tablet daily except 1 tablet on Mondays and Thursdays Recheck in 6 weeks

## 2020-10-23 ENCOUNTER — Other Ambulatory Visit: Payer: Self-pay | Admitting: Cardiology

## 2020-11-21 DIAGNOSIS — M722 Plantar fascial fibromatosis: Secondary | ICD-10-CM | POA: Diagnosis not present

## 2020-11-21 DIAGNOSIS — M79672 Pain in left foot: Secondary | ICD-10-CM | POA: Diagnosis not present

## 2020-11-22 ENCOUNTER — Ambulatory Visit (INDEPENDENT_AMBULATORY_CARE_PROVIDER_SITE_OTHER): Payer: Medicare Other

## 2020-11-22 DIAGNOSIS — I420 Dilated cardiomyopathy: Secondary | ICD-10-CM | POA: Diagnosis not present

## 2020-11-23 LAB — CUP PACEART REMOTE DEVICE CHECK
Battery Remaining Longevity: 52 mo
Battery Remaining Percentage: 53 %
Battery Voltage: 2.93 V
Brady Statistic AP VP Percent: 1 %
Brady Statistic AP VS Percent: 18 %
Brady Statistic AS VP Percent: 1 %
Brady Statistic AS VS Percent: 80 %
Brady Statistic RA Percent Paced: 17 %
Brady Statistic RV Percent Paced: 1 %
Date Time Interrogation Session: 20221013074803
HighPow Impedance: 69 Ohm
HighPow Impedance: 72 Ohm
Implantable Lead Implant Date: 20100416
Implantable Lead Implant Date: 20100416
Implantable Lead Location: 753859
Implantable Lead Location: 753860
Implantable Lead Model: 7122
Implantable Pulse Generator Implant Date: 20170626
Lead Channel Impedance Value: 430 Ohm
Lead Channel Impedance Value: 450 Ohm
Lead Channel Pacing Threshold Amplitude: 0.75 V
Lead Channel Pacing Threshold Amplitude: 1 V
Lead Channel Pacing Threshold Pulse Width: 0.5 ms
Lead Channel Pacing Threshold Pulse Width: 0.5 ms
Lead Channel Sensing Intrinsic Amplitude: 0.8 mV
Lead Channel Sensing Intrinsic Amplitude: 11.7 mV
Lead Channel Setting Pacing Amplitude: 2 V
Lead Channel Setting Pacing Amplitude: 2.5 V
Lead Channel Setting Pacing Pulse Width: 0.5 ms
Lead Channel Setting Sensing Sensitivity: 0.5 mV
Pulse Gen Serial Number: 7370130

## 2020-11-28 ENCOUNTER — Ambulatory Visit (INDEPENDENT_AMBULATORY_CARE_PROVIDER_SITE_OTHER): Payer: Medicare Other | Admitting: *Deleted

## 2020-11-28 DIAGNOSIS — Z5181 Encounter for therapeutic drug level monitoring: Secondary | ICD-10-CM

## 2020-11-28 DIAGNOSIS — I6350 Cerebral infarction due to unspecified occlusion or stenosis of unspecified cerebral artery: Secondary | ICD-10-CM

## 2020-11-28 DIAGNOSIS — Z8673 Personal history of transient ischemic attack (TIA), and cerebral infarction without residual deficits: Secondary | ICD-10-CM

## 2020-11-28 LAB — POCT INR: INR: 2.6 (ref 2.0–3.0)

## 2020-11-28 NOTE — Patient Instructions (Signed)
Continue warfarin 1/2 tablet daily except 1 tablet on Mondays and Thursdays Recheck in 6 weeks

## 2020-11-30 NOTE — Progress Notes (Signed)
Remote ICD transmission.   

## 2020-12-10 DIAGNOSIS — R7301 Impaired fasting glucose: Secondary | ICD-10-CM | POA: Diagnosis not present

## 2020-12-10 DIAGNOSIS — E876 Hypokalemia: Secondary | ICD-10-CM | POA: Diagnosis not present

## 2020-12-10 DIAGNOSIS — E7849 Other hyperlipidemia: Secondary | ICD-10-CM | POA: Diagnosis not present

## 2020-12-10 DIAGNOSIS — I5022 Chronic systolic (congestive) heart failure: Secondary | ICD-10-CM | POA: Diagnosis not present

## 2020-12-12 NOTE — Progress Notes (Signed)
Cardiology Office Note   Date:  12/14/2020   ID:  [place], L[place]eter Mar 19, 1950, MRN [id number]  PCP:  Caryl Bis, MD  Cardiologist:   Minus Breeding, MD    Chief Complaint  Patient presents with   Coronary Artery Disease      History of Present Illness: [name] [name] is a 70 y.o. female who presents for follow up of cardiomyopathy.  She has a history of a nonischemic cardiomyopathy. Her ejection fraction was at one point in time 15 - 20%. A later ejection fraction was about 40% in 2015.   Most recently the EF was 25% again.   I sent her for a CPX in 2018 which demonstrated a good VO2 Max.    Since I last saw her she has done well.  She took a cruise to [place].  The patient denies any new symptoms such as chest discomfort, neck or arm discomfort. There has been no new shortness of breath, PND or orthopnea. There have been no reported palpitations, presyncope or syncope.    Past Medical History:  Diagnosis Date   Arthritis    thumbs   Breast cancer ([place])    right   Cerebrovascular disease    s/p right MCA cardioemoblic infarct 2/33/00   CHF (congestive heart failure) (HCC)    Nonischemic cardiomyopathy (HCC)    a. +LMNA mutation b. s/p STJ dual chamber ICD   Stroke ([place]) 02/2008   no deficits    Past Surgical History:  Procedure Laterality Date   ABDOMINAL HYSTERECTOMY     BREAST IMPLANT REMOVAL Right 05/31/2015   Procedure: REMOVAL RIGHT  BREAST IMPLANTS;  Surgeon: Crissie Reese, MD;  Location: [place];  Service: Plastics;  Laterality: Right;   BREAST RECONSTRUCTION Right 1995   BREAST RECONSTRUCTION Right 05/31/2015   Procedure: RIGHT BREAST RECONSTRUCTION WITH SALINE IMPLANTS ;  Surgeon: Crissie Reese, MD;  Location: [place];  Service: Plastics;  Laterality: Right;   CARDIAC DEFIBRILLATOR PLACEMENT  2010   STJ dual chamber ICD implanted for NICM by Dr Caryl Comes   CARDIAC DEFIBRILLATOR PLACEMENT  05/26/2008   CATARACT EXTRACTION W/PHACO Right 03/19/2018    Procedure: CATARACT EXTRACTION PHACO AND INTRAOCULAR LENS PLACEMENT ([facility]);  Surgeon: Baruch Goldmann, MD;  [facility];  Service: Ophthalmology;  Laterality: Right;  CDE: 5.42   CATARACT EXTRACTION W/PHACO Left 10/01/2018   Procedure: CATARACT EXTRACTION PHACO AND INTRAOCULAR LENS PLACEMENT ([facility]);  Surgeon: Baruch Goldmann, MD;  [facility];  Service: Ophthalmology;  Laterality: Left;  left, CDE: 6.06   CHOLECYSTECTOMY     COLONOSCOPY     EP IMPLANTABLE DEVICE N/A 08/06/2015   Procedure: ICD Generator Changeout;  Surgeon: Deboraha Sprang, MD;  Location: [place] [facility];  Service: Cardiovascular;  Laterality: N/A;   ESOPHAGOGASTRODUODENOSCOPY     MASTECTOMY Right    MASTOPEXY Left 05/31/2015   Procedure: LEFT BREAST MASTOPEXY;  Surgeon: Crissie Reese, MD;  Location: [place];  Service: Plastics;  Laterality: Left;   PACEMAKER INSERTION  05/2008   TONSILLECTOMY     VARICOSE VEIN SURGERY Left    leg     Current Outpatient Medications  Medication Sig Dispense Refill   ascorbic acid (VITAMIN C) 500 MG tablet Take 1 tablet (500 mg total) by mouth daily. 30 tablet 2   atorvastatin (LIPITOR) 20 MG tablet Take 1 tablet (20 mg total) by mouth daily. 30 tablet 2   BIOTIN PO Take 1 tablet by mouth daily.  digoxin (LANOXIN) 0.125 MG tablet TAKE ONE TABLET BY MOUTH DAILY. 90 tablet 2   ENTRESTO 97-103 MG TAKE ONE TABLET BY MOUTH TWICE DAILY 60 tablet 11   furosemide (LASIX) 40 MG tablet Take 0.5 tablets (20 mg total) by mouth as needed for fluid or edema (q24 hrs as needed). (Patient taking differently: Take 40 mg by mouth daily. 1 TABLET 4OMG TOTAL) 90 tablet 3   metoprolol succinate (TOPROL-XL) 50 MG 24 hr tablet Take 1 tablet (50 mg total) by mouth 2 (two) times daily. 60 tablet 11   Multiple Vitamin (MULTIVITAMIN WITH MINERALS) TABS tablet Take 1 tablet by mouth daily.     nitrofurantoin (MACRODANTIN) 50 MG capsule Take 50 mg by mouth as needed.     Omega-3 Fatty Acids (FISH OIL PO) Take 1  capsule by mouth daily.      spironolactone (ALDACTONE) 25 MG tablet TAKE 1/2 TABLET BY MOUTH DAILY. 45 tablet 3   warfarin (COUMADIN) 5 MG tablet TAKE 1/2 TO 1 TABLET BY MOUTH DAILY AS DIRECTED 90 tablet 3   No current facility-administered medications for this visit.    Allergies:   Patient has no known allergies.   ROS:  Please see the history of present illness.   Otherwise, review of systems are positive for none.   All other systems are reviewed and negative.    PHYSICAL EXAM: VS:  BP 117/63   Pulse (!) 53   Ht 5\' 8"  (1.727 m)   Wt 164 lb (74.4 kg)   SpO2 98%   BMI 24.94 kg/m  , BMI Body mass index is 24.94 kg/m.  GENERAL:  Well appearing NECK:  No jugular venous distention, waveform within normal limits, carotid upstroke brisk and symmetric, no bruits, no thyromegaly LUNGS:  Clear to auscultation bilaterally CHEST: Well-healed ICD pocket HEART:  PMI not displaced or sustained,S1 and S2 within normal limits, no S3, no S4, no clicks, no rubs, no murmurs ABD:  Flat, positive bowel sounds normal in frequency in pitch, no bruits, no rebound, no guarding, no midline pulsatile mass, no hepatomegaly, no splenomegaly EXT:  2 plus pulses throughout, no edema, no cyanosis no clubbing    EKG:  EKG is  ordered today. Atrial paced rhythm with ventricular tracking, rate 53,  first-degree AV block, no acute ST-T wave changes.,  Premature ventricular contractions   Recent Labs: No results found for requested labs within last 8760 hours.    Lipid Panel    Component Value Date/Time   CHOL  03/10/2008 0500    128        ATP III CLASSIFICATION:  <200     mg/dL   Desirable  200-239  mg/dL   Borderline High  >=240    mg/dL   High          TRIG 167 (H) 02/19/2019 1244   HDL 33 (L) 03/10/2008 0500   CHOLHDL 3.9 03/10/2008 0500   VLDL 13 03/10/2008 0500   LDLCALC  03/10/2008 0500    82        Total Cholesterol/HDL:CHD Risk Coronary Heart Disease Risk Table                      Men   Women  1/2 Average Risk   3.4   3.3  Average Risk       5.0   4.4  2 X Average Risk   9.6   7.1  3 X Average Risk  23.4  11.0        Use the calculated Patient Ratio above and the CHD Risk Table to determine the patient's CHD Risk.        ATP III CLASSIFICATION (LDL):  <100     mg/dL   Optimal  100-129  mg/dL   Near or Above                    Optimal  130-159  mg/dL   Borderline  160-189  mg/dL   High  >190     mg/dL   Very High      Wt Readings from Last 3 Encounters:  12/14/20 164 lb (74.4 kg)  06/20/20 167 lb 9.6 oz (76 kg)  12/14/19 168 lb (76.2 kg)      Other studies Reviewed: Additional studies/ records that were reviewed today include: Labs Review of the above records demonstrates:  Please see elsewhere in the note.     ASSESSMENT AND PLAN:  CARDIOMYOPATHY, PRIMARY, DILATED - She is on optimal medical therapy and does not tolerate further med titration.  She seems to be euvolemic.  No change in therapy.   ICD -  She is up-to-date with follow-up and saw Dr. Caryl Comes earlier this year.  I reviewed the tracing from October.  No change in therapy.   CVA -  She has no significant residual from this.  We did talk about anticoagulation and she was thought to have a cardioembolic source and so she will remain on anticoagulation and is doing well with warfarin so no need to change.   Current medicines are reviewed at length with the patient today.  The patient does not have concerns regarding medicines.  The following changes have been made: None Labs/ tests ordered today include:  None  Orders Placed This Encounter  Procedures   EKG 12-Lead      Disposition:   FU with me in 6 months.   Signed, Minus Breeding, MD  12/14/2020 11:11 AM    [place]

## 2020-12-14 ENCOUNTER — Encounter: Payer: Self-pay | Admitting: Cardiology

## 2020-12-14 ENCOUNTER — Ambulatory Visit: Payer: Medicare Other | Admitting: Cardiology

## 2020-12-14 ENCOUNTER — Other Ambulatory Visit: Payer: Self-pay

## 2020-12-14 VITALS — BP 117/63 | HR 53 | Ht 68.0 in | Wt 164.0 lb

## 2020-12-14 DIAGNOSIS — Z95810 Presence of automatic (implantable) cardiac defibrillator: Secondary | ICD-10-CM | POA: Diagnosis not present

## 2020-12-14 DIAGNOSIS — I420 Dilated cardiomyopathy: Secondary | ICD-10-CM | POA: Diagnosis not present

## 2020-12-14 NOTE — Patient Instructions (Signed)
Medication Instructions:  ?Your physician recommends that you continue on your current medications as directed. Please refer to the Current Medication list given to you today. ? ?*If you need a refill on your cardiac medications before your next appointment, please call your pharmacy* ? ?Lab Work: ?NONE ordered at this time of appointment  ? ?If you have labs (blood work) drawn today and your tests are completely normal, you will receive your results only by: ?MyChart Message (if you have MyChart) OR ?A paper copy in the mail ?If you have any lab test that is abnormal or we need to change your treatment, we will call you to review the results. ? ?Testing/Procedures: ?NONE ordered at this time of appointment  ? ?Follow-Up: ?At [facility], you and your health needs are our priority.  As part of our continuing mission to provide you with exceptional heart care, we have created designated Provider Care Teams.  These Care Teams include your primary Cardiologist (physician) and Advanced Practice Providers (APPs -  Physician Assistants and Nurse Practitioners) who all work together to provide you with the care you need, when you need it. ? ?Your next appointment:   ?6 month(s) ? ?The format for your next appointment:   ?In Person ? ?Provider:   ?James Hochrein, MD   ? ?Other Instructions ? ? ?

## 2020-12-19 DIAGNOSIS — M79672 Pain in left foot: Secondary | ICD-10-CM | POA: Diagnosis not present

## 2020-12-19 DIAGNOSIS — L03032 Cellulitis of left toe: Secondary | ICD-10-CM | POA: Diagnosis not present

## 2020-12-19 DIAGNOSIS — L600 Ingrowing nail: Secondary | ICD-10-CM | POA: Diagnosis not present

## 2020-12-19 DIAGNOSIS — B351 Tinea unguium: Secondary | ICD-10-CM | POA: Diagnosis not present

## 2020-12-19 DIAGNOSIS — M79675 Pain in left toe(s): Secondary | ICD-10-CM | POA: Diagnosis not present

## 2021-01-09 ENCOUNTER — Ambulatory Visit (INDEPENDENT_AMBULATORY_CARE_PROVIDER_SITE_OTHER): Payer: Medicare Other | Admitting: *Deleted

## 2021-01-09 DIAGNOSIS — Z5181 Encounter for therapeutic drug level monitoring: Secondary | ICD-10-CM

## 2021-01-09 DIAGNOSIS — I6350 Cerebral infarction due to unspecified occlusion or stenosis of unspecified cerebral artery: Secondary | ICD-10-CM

## 2021-01-09 LAB — POCT INR: INR: 3.2 — AB (ref 2.0–3.0)

## 2021-01-09 NOTE — Patient Instructions (Signed)
Hold warfarin tonight then resume 1/2 tablet daily except 1 tablet on Mondays and Thursdays Recheck in 6 weeks

## 2021-01-15 DIAGNOSIS — Z1231 Encounter for screening mammogram for malignant neoplasm of breast: Secondary | ICD-10-CM | POA: Diagnosis not present

## 2021-01-21 ENCOUNTER — Other Ambulatory Visit: Payer: Self-pay | Admitting: Cardiology

## 2021-01-21 ENCOUNTER — Other Ambulatory Visit: Payer: Self-pay | Admitting: Internal Medicine

## 2021-01-21 DIAGNOSIS — I5022 Chronic systolic (congestive) heart failure: Secondary | ICD-10-CM

## 2021-02-20 ENCOUNTER — Ambulatory Visit (INDEPENDENT_AMBULATORY_CARE_PROVIDER_SITE_OTHER): Payer: Medicare Other | Admitting: *Deleted

## 2021-02-20 ENCOUNTER — Other Ambulatory Visit: Payer: Self-pay

## 2021-02-20 DIAGNOSIS — I6350 Cerebral infarction due to unspecified occlusion or stenosis of unspecified cerebral artery: Secondary | ICD-10-CM

## 2021-02-20 DIAGNOSIS — N6323 Unspecified lump in the left breast, lower outer quadrant: Secondary | ICD-10-CM | POA: Diagnosis not present

## 2021-02-20 DIAGNOSIS — Z5181 Encounter for therapeutic drug level monitoring: Secondary | ICD-10-CM

## 2021-02-20 DIAGNOSIS — R922 Inconclusive mammogram: Secondary | ICD-10-CM | POA: Diagnosis not present

## 2021-02-20 LAB — POCT INR: INR: 2.8 (ref 2.0–3.0)

## 2021-02-20 NOTE — Patient Instructions (Signed)
Continue warfarin 1/2 tablet daily except 1 tablet on Mondays and Thursdays Recheck in 6 weeks

## 2021-02-21 ENCOUNTER — Ambulatory Visit (INDEPENDENT_AMBULATORY_CARE_PROVIDER_SITE_OTHER): Payer: Medicare Other

## 2021-02-21 DIAGNOSIS — N6323 Unspecified lump in the left breast, lower outer quadrant: Secondary | ICD-10-CM | POA: Diagnosis not present

## 2021-02-21 DIAGNOSIS — Z170 Estrogen receptor positive status [ER+]: Secondary | ICD-10-CM | POA: Diagnosis not present

## 2021-02-21 DIAGNOSIS — I420 Dilated cardiomyopathy: Secondary | ICD-10-CM | POA: Diagnosis not present

## 2021-02-21 DIAGNOSIS — C50512 Malignant neoplasm of lower-outer quadrant of left female breast: Secondary | ICD-10-CM | POA: Diagnosis not present

## 2021-02-21 DIAGNOSIS — N6321 Unspecified lump in the left breast, upper outer quadrant: Secondary | ICD-10-CM | POA: Diagnosis not present

## 2021-02-21 LAB — CUP PACEART REMOTE DEVICE CHECK
Battery Remaining Longevity: 49 mo
Battery Remaining Percentage: 51 %
Battery Voltage: 2.92 V
Brady Statistic AP VP Percent: 1 %
Brady Statistic AP VS Percent: 20 %
Brady Statistic AS VP Percent: 1 %
Brady Statistic AS VS Percent: 78 %
Brady Statistic RA Percent Paced: 17 %
Brady Statistic RV Percent Paced: 1 %
Date Time Interrogation Session: 20230112040907
HighPow Impedance: 64 Ohm
HighPow Impedance: 64 Ohm
Implantable Lead Implant Date: 20100416
Implantable Lead Implant Date: 20100416
Implantable Lead Location: 753859
Implantable Lead Location: 753860
Implantable Lead Model: 7122
Implantable Pulse Generator Implant Date: 20170626
Lead Channel Impedance Value: 430 Ohm
Lead Channel Impedance Value: 430 Ohm
Lead Channel Pacing Threshold Amplitude: 0.75 V
Lead Channel Pacing Threshold Amplitude: 1 V
Lead Channel Pacing Threshold Pulse Width: 0.5 ms
Lead Channel Pacing Threshold Pulse Width: 0.5 ms
Lead Channel Sensing Intrinsic Amplitude: 0.9 mV
Lead Channel Sensing Intrinsic Amplitude: 11.7 mV
Lead Channel Setting Pacing Amplitude: 2 V
Lead Channel Setting Pacing Amplitude: 2.5 V
Lead Channel Setting Pacing Pulse Width: 0.5 ms
Lead Channel Setting Sensing Sensitivity: 0.5 mV
Pulse Gen Serial Number: 7370130

## 2021-02-22 DIAGNOSIS — C50919 Malignant neoplasm of unspecified site of unspecified female breast: Secondary | ICD-10-CM | POA: Diagnosis not present

## 2021-02-26 ENCOUNTER — Telehealth: Payer: Self-pay | Admitting: *Deleted

## 2021-02-26 MED ORDER — ENTRESTO 97-103 MG PO TABS: 1.0000 | ORAL_TABLET | Freq: Two times a day (BID) | ORAL | 3 refills | Status: DC

## 2021-02-26 NOTE — Telephone Encounter (Signed)
Patient brought in patient assistance papers for [facility]  Forms filled and will fax once Dr Percival Spanish signs  Patient also wanted to let Dr Percival Spanish know she has recently been diagnosed with breast cancer left breast  Will forward so he is aware

## 2021-02-27 DIAGNOSIS — C50919 Malignant neoplasm of unspecified site of unspecified female breast: Secondary | ICD-10-CM | POA: Insufficient documentation

## 2021-02-27 NOTE — Telephone Encounter (Signed)
[employer] patient assistance application faxed to the company.

## 2021-02-28 NOTE — Telephone Encounter (Signed)
Patient has been approved for [employer] patient assistance.

## 2021-03-04 DIAGNOSIS — C50919 Malignant neoplasm of unspecified site of unspecified female breast: Secondary | ICD-10-CM | POA: Diagnosis not present

## 2021-03-05 NOTE — Progress Notes (Signed)
Remote ICD transmission.   

## 2021-03-12 ENCOUNTER — Other Ambulatory Visit: Payer: Self-pay | Admitting: Surgery

## 2021-03-12 ENCOUNTER — Encounter: Payer: Self-pay | Admitting: Cardiology

## 2021-03-12 DIAGNOSIS — Z170 Estrogen receptor positive status [ER+]: Secondary | ICD-10-CM | POA: Diagnosis not present

## 2021-03-12 DIAGNOSIS — C50912 Malignant neoplasm of unspecified site of left female breast: Secondary | ICD-10-CM | POA: Diagnosis not present

## 2021-03-13 ENCOUNTER — Telehealth: Payer: Self-pay | Admitting: *Deleted

## 2021-03-13 NOTE — Telephone Encounter (Signed)
Noted.  Will await surgical date.

## 2021-03-13 NOTE — Telephone Encounter (Signed)
Clinical pharmacist to review Coumadin.  Her previous stroke was felt to be cardioembolic in source, therefore she has been kept on anticoagulation therapy.

## 2021-03-13 NOTE — Telephone Encounter (Signed)
° °  Pre-operative Risk Assessment    Patient Name: [name] [name]  DOB: February 09, 1951 MRN: [id number]      Request for Surgical Clearance    Procedure:   left mastectomy with sentinel node biopsy  Date of Surgery:  Clearance TBD                                 Surgeon:  Dr Coralie Keens Surgeon's Group or Practice Name:  [facility] Phone number:   [phone] Fax number:  [phone]   Type of Clearance Requested:   - Medical  - Pharmacy:  Hold Warfarin (Coumadin)     Type of Anesthesia:  General    Additional requests/questions:  Please advise surgeon/provider what medications should be held.  Olin Pia   03/13/2021, 11:19 AM

## 2021-03-13 NOTE — Telephone Encounter (Signed)
I think is perfectly reasonable to clear her for the surgery but also to get a follow-up echocardiogram since its been quite a while.  I would not hold up her surgery for the echo.

## 2021-03-13 NOTE — Telephone Encounter (Signed)
Patient with diagnosis of stroke on warfarin for anticoagulation.    Procedure: left mastectomy with sentinel node biopsy Date of procedure: TBD  CrCl 36mL/min Platelet count 177K  Pt takes warfarin for hx of cardioembolic stroke. She has been bridged with Lovenox for past procedures (2017-2018) per Dr Hochrein's recommendation. Recommend she hold warfarin for 5 days with Lovenox bridge for this procedure as well.   Pt is followed in [facility] Coumadin clinic where bridge can be coordinated. Next INR check currently scheduled 2/22 - pt should call Coumadin clinic once she gets procedure date in case her INR check needs to be moved up to coordinate bridging.

## 2021-03-13 NOTE — Telephone Encounter (Signed)
Patient was diagnosed with breast cancer earlier this year after abnormal mammogram.  She has a longstanding history of nonischemic cardiomyopathy s/p ICD and a history of CVA.  Patient was remotely followed by Dr. Lutricia Feil, then later transferred care to Dr. Percival Spanish.  According to the previous note, her cardiomyopathy was felt to be nonischemic in nature.  EF was low as low as 15 to 20% 1 time however improved to 40 to 45% on the previous echocardiogram.  She had a cardiopulmonary stress test in 2018 that demonstrated no cardiopulmonary limitation.  Talking with the patient, she is functionally active without any recent exertional chest pain.  She is able to walk more than 2 blocks away from her home and back and climb up 2-3 flight of stairs without any issue.  Last echocardiogram in 2021 showed EF 40 to 45%.  From the functional perspective, I think the patient should be cleared, will discuss with MD to see if he would recommend a repeat echocardiogram prior to the surgery?  I have also informed the patient that she will need a Lovenox bridging, once she is aware of the date of the surgery, she will need to contact Coumadin clinic to schedule appointment 7 days in advance to discuss Lovenox bridging.  Dr. Percival Spanish, please forward your response to P CV DIV PREOP

## 2021-03-14 ENCOUNTER — Other Ambulatory Visit: Payer: Self-pay | Admitting: *Deleted

## 2021-03-14 DIAGNOSIS — I5022 Chronic systolic (congestive) heart failure: Secondary | ICD-10-CM

## 2021-03-14 DIAGNOSIS — I420 Dilated cardiomyopathy: Secondary | ICD-10-CM

## 2021-03-14 DIAGNOSIS — Z8673 Personal history of transient ischemic attack (TIA), and cerebral infarction without residual deficits: Secondary | ICD-10-CM

## 2021-03-14 NOTE — Telephone Encounter (Signed)
Order placed for patient to have a repeat echocardiogram. Could you please arrange with patient? Thanks

## 2021-03-14 NOTE — Telephone Encounter (Signed)
° °  Name: [name] [name]  DOB: 1950/08/14  MRN: [id number]   Primary Cardiologist: Minus Breeding, MD  Chart reviewed as part of pre-operative protocol coverage. Patient was contacted 03/14/2021 in reference to pre-operative risk assessment for pending surgery as outlined below.  [name] [name] was last seen on 12/14/2020 by Dr. Minus Breeding.  Since that day, [name] [name] has done well without exertional chest pain or worsening dyspnea.  She can accomplish more than 4 METS of activity.  The case has been discussed with Dr. [name], she is cleared to proceed with surgery from a cardiac perspective.  We will obtain a repeat her echocardiogram, however echocardiogram does not need to be done prior to the surgery.  Therefore, based on ACC/AHA guidelines, the patient would be at acceptable risk for the planned procedure without further cardiovascular testing.   The patient was advised that if she develops new symptoms prior to surgery to contact our office to arrange for a follow-up visit, and she verbalized understanding.  I will route this recommendation to the requesting party via Epic fax function and remove from pre-op pool. Please call with questions.  [place], [place] 03/14/2021, 11:38 AM

## 2021-03-14 NOTE — Telephone Encounter (Signed)
Per recommendation by Dr. [name], please arrange a repeat echocardiogram.  Echocardiogram does not need to be done prior to the surgery.  Patient is cleared to proceed with her surgery.  Cardiac clearance letter has been forwarded to the requesting physicians office.

## 2021-03-15 ENCOUNTER — Other Ambulatory Visit: Payer: Self-pay

## 2021-03-15 ENCOUNTER — Ambulatory Visit (HOSPITAL_COMMUNITY): Payer: Medicare Other | Attending: Cardiovascular Disease

## 2021-03-15 DIAGNOSIS — Z8673 Personal history of transient ischemic attack (TIA), and cerebral infarction without residual deficits: Secondary | ICD-10-CM

## 2021-03-15 DIAGNOSIS — I5022 Chronic systolic (congestive) heart failure: Secondary | ICD-10-CM | POA: Diagnosis not present

## 2021-03-15 DIAGNOSIS — I420 Dilated cardiomyopathy: Secondary | ICD-10-CM | POA: Diagnosis not present

## 2021-03-15 LAB — ECHOCARDIOGRAM COMPLETE
Area-P 1/2: 2.83 cm2
S' Lateral: 4.7 cm

## 2021-03-18 NOTE — Telephone Encounter (Signed)
Patient surgical clearance is in Epic- see surgery clearance note.  Thanks!

## 2021-03-19 ENCOUNTER — Other Ambulatory Visit: Payer: Self-pay | Admitting: *Deleted

## 2021-03-19 MED ORDER — ENOXAPARIN SODIUM 120 MG/0.8ML IJ SOSY: 120.0000 mg | PREFILLED_SYRINGE | INTRAMUSCULAR | 0 refills | Status: DC

## 2021-03-20 ENCOUNTER — Encounter: Payer: Self-pay | Admitting: *Deleted

## 2021-03-29 ENCOUNTER — Ambulatory Visit (INDEPENDENT_AMBULATORY_CARE_PROVIDER_SITE_OTHER): Payer: Medicare Other | Admitting: *Deleted

## 2021-03-29 ENCOUNTER — Other Ambulatory Visit: Payer: Self-pay

## 2021-03-29 DIAGNOSIS — Z5181 Encounter for therapeutic drug level monitoring: Secondary | ICD-10-CM | POA: Diagnosis not present

## 2021-03-29 DIAGNOSIS — E7849 Other hyperlipidemia: Secondary | ICD-10-CM | POA: Diagnosis not present

## 2021-03-29 DIAGNOSIS — I10 Essential (primary) hypertension: Secondary | ICD-10-CM | POA: Diagnosis not present

## 2021-03-29 DIAGNOSIS — R739 Hyperglycemia, unspecified: Secondary | ICD-10-CM | POA: Diagnosis not present

## 2021-03-29 DIAGNOSIS — E782 Mixed hyperlipidemia: Secondary | ICD-10-CM | POA: Diagnosis not present

## 2021-03-29 DIAGNOSIS — I6350 Cerebral infarction due to unspecified occlusion or stenosis of unspecified cerebral artery: Secondary | ICD-10-CM | POA: Diagnosis not present

## 2021-03-29 LAB — POCT INR: INR: 3.3 — AB (ref 2.0–3.0)

## 2021-03-29 NOTE — Patient Instructions (Signed)
04/05/21  Left Mastectomy  Labs: 03/04/21  Hgb 12.9  Hct 39.2  Plts 177  SCr 1.10  CrCl 57.68  2/18  Last last dose of warfarin (2.5mg ) 2/19  No lovenox or warfarin 2/20 - 2/23  Lovenox 120mg  sq @ 8:00am 2/24  No lovenox ------procedure---------- Resume Lovenox and warfarin after proceure till INR > 2.0  Will be in hospital 1-2 days.  Follow discharge instructions

## 2021-04-01 ENCOUNTER — Encounter: Payer: Self-pay | Admitting: Internal Medicine

## 2021-04-01 NOTE — Pre-Procedure Instructions (Signed)
Surgical Instructions    Your procedure is scheduled on Friday, February 24th.  Report to [facility] Main Entrance "A" at 5:30 A.M., then check in with the Admitting office.  Call this number if you have problems the morning of surgery:  [phone]   If you have any questions prior to your surgery date call [phone]: Open Monday-Friday 8am-4pm    Remember:  Do not eat after midnight the night before your surgery  You may drink clear liquids until 4:30 a.m. the morning of your surgery.   Clear liquids allowed are: Water, Non-Citrus Juices (without pulp), Carbonated Beverages, Clear Tea, Black Coffee Only (NO MILK, CREAM OR POWDERED CREAMER of any kind), and Gatorade.    Take these medicines the morning of surgery with A SIP OF WATER  atorvastatin (LIPITOR) digoxin (LANOXIN) metoprolol succinate (TOPROL-XL)   Follow your surgeon's instructions on when to stop warfarin (COUMADIN).  If no instructions were given by your surgeon then you will need to call the office to get those instructions.    As of today, STOP taking any Aspirin (unless otherwise instructed by your surgeon) Aleve, Naproxen, Ibuprofen, Motrin, Advil, Goody's, BC's, all herbal medications, fish oil, and all vitamins.                     Do NOT Smoke (Tobacco/Vaping) for 24 hours prior to your procedure.  If you use a CPAP at night, you may bring your mask/headgear for your overnight stay.   Contacts, glasses, piercing's, hearing aid's, dentures or partials may not be worn into surgery, please bring cases for these belongings.    For patients admitted to the hospital, discharge time will be determined by your treatment team.   Patients discharged the day of surgery will not be allowed to drive home, and someone needs to stay with them for 24 hours.  NO VISITORS WILL BE ALLOWED IN PRE-OP WHERE PATIENTS ARE PREPPED FOR SURGERY.  ONLY 1 SUPPORT PERSON MAY BE PRESENT IN THE WAITING ROOM WHILE YOU ARE IN SURGERY.  IF  YOU ARE TO BE ADMITTED, ONCE YOU ARE IN YOUR ROOM YOU WILL BE ALLOWED TWO (2) VISITORS. (1) VISITOR MAY STAY OVERNIGHT BUT MUST ARRIVE TO THE ROOM BY 8pm.  Minor children may have two parents present. Special consideration for safety and communication needs will be reviewed on a case by case basis.   Special instructions:   [facility]- Preparing For Surgery  Before surgery, you can play an important role. Because skin is not sterile, your skin needs to be as free of germs as possible. You can reduce the number of germs on your skin by washing with CHG (chlorahexidine gluconate) Soap before surgery.  CHG is an antiseptic cleaner which kills germs and bonds with the skin to continue killing germs even after washing.    Oral Hygiene is also important to reduce your risk of infection.  Remember - BRUSH YOUR TEETH THE MORNING OF SURGERY WITH YOUR REGULAR TOOTHPASTE  Please do not use if you have an allergy to CHG or antibacterial soaps. If your skin becomes reddened/irritated stop using the CHG.  Do not shave (including legs and underarms) for at least 48 hours prior to first CHG shower. It is OK to shave your face.  Please follow these instructions carefully.   Shower the NIGHT BEFORE SURGERY and the MORNING OF SURGERY  If you chose to wash your hair, wash your hair first as usual with your normal shampoo.  After  you shampoo, rinse your hair and body thoroughly to remove the shampoo.  Use CHG Soap as you would any other liquid soap. You can apply CHG directly to the skin and wash gently with a scrungie or a clean washcloth.   Apply the CHG Soap to your body ONLY FROM THE NECK DOWN.  Do not use on open wounds or open sores. Avoid contact with your eyes, ears, mouth and genitals (private parts). Wash Face and genitals (private parts)  with your normal soap.   Wash thoroughly, paying special attention to the area where your surgery will be performed.  Thoroughly rinse your body with warm water  from the neck down.  DO NOT shower/wash with your normal soap after using and rinsing off the CHG Soap.  Pat yourself dry with a CLEAN TOWEL.  Wear CLEAN PAJAMAS to bed the night before surgery  Place CLEAN SHEETS on your bed the night before your surgery  DO NOT SLEEP WITH PETS.   Day of Surgery: Shower with CHG soap. Do not wear jewelry, make up, nail polish, gel polish, artificial nails, or any other type of covering on natural nails including finger and toenails. If patients have artificial nails, gel coating, etc. that need to be removed by a nail salon please have this removed prior to surgery. Surgery may need to be canceled/delayed if the surgeon/anesthesiologist feels like the patient is unable to be adequately monitored. Do not wear lotions, powders, perfumes, or deodorant. Do not shave 48 hours prior to surgery.   Do not bring valuables to the hospital. [facility] is not responsible for any belongings or valuables. Wear Clean/Comfortable clothing the morning of surgery Remember to brush your teeth WITH YOUR REGULAR TOOTHPASTE.   Please read over the following fact sheets that you were given.   3 days prior to your procedure or After your COVID test   You are not required to quarantine however you are required to wear a well-fitting mask when you are out and around people not in your household. If your mask becomes wet or soiled, replace with a new one.   Wash your hands often with soap and water for 20 seconds or clean your hands with an alcohol-based hand sanitizer that contains at least 60% alcohol.   Do not share personal items.   Notify your provider:  o if you are in close contact with someone who has COVID  o or if you develop a fever of 100.4 or greater, sneezing, cough, sore throat, shortness of breath or body aches.

## 2021-04-01 NOTE — Progress Notes (Signed)
[facility] DEVICE PROGRAMMING  Patient Information: Name:  [name] [name]  DOB:  01/24/1951  MRN:  [id number]    Jacqlyn Larsen, RN  [facility] Device Planned Procedure:  Left mastectomy with sentinel node biopsy  Surgeon:  Dr. Coralie Keens  Date of Procedure:  04/05/21  Cautery will be used.  Position during surgery:  Supine   Please send documentation back to:  [facility] (Fax # [phone])   Jacqlyn Larsen, RN  04/01/2021 2:29 PM  Device Information:  Clinic EP Physician:  [name], MD   Device Type:  Pacemaker Manufacturer and Phone #:  [employer]: [phone] Pacemaker Dependent?:  No. Date of Last Device Check:  02/21/21 Remote;  06/20/20 in -clinic Normal Device Function?:  Yes.    Electrophysiologist's Recommendations:  Have magnet available. Provide continuous ECG monitoring when magnet is used or reprogramming is to be performed.  Procedure will likely interfere with device function.  Device should be programmed:  Asynchronous pacing during procedure and returned to normal programming after procedure  Per Device Clinic Standing Orders, York Ram, RN  2:45 PM 04/01/2021

## 2021-04-02 ENCOUNTER — Other Ambulatory Visit: Payer: Self-pay

## 2021-04-02 ENCOUNTER — Encounter (HOSPITAL_COMMUNITY)
Admission: RE | Admit: 2021-04-02 | Discharge: 2021-04-02 | Disposition: A | Discharge status: Home / Self Care | Payer: Medicare Other | Source: Ambulatory Visit | Attending: Surgery | Admitting: Surgery

## 2021-04-02 ENCOUNTER — Encounter (HOSPITAL_COMMUNITY): Payer: Self-pay

## 2021-04-02 VITALS — BP 114/57 | HR 59 | Temp 97.6°F | Resp 16 | Ht 68.0 in | Wt 168.5 lb

## 2021-04-02 DIAGNOSIS — Z23 Encounter for immunization: Secondary | ICD-10-CM | POA: Diagnosis not present

## 2021-04-02 DIAGNOSIS — E876 Hypokalemia: Secondary | ICD-10-CM | POA: Diagnosis not present

## 2021-04-02 DIAGNOSIS — Z95810 Presence of automatic (implantable) cardiac defibrillator: Secondary | ICD-10-CM | POA: Insufficient documentation

## 2021-04-02 DIAGNOSIS — Z01812 Encounter for preprocedural laboratory examination: Secondary | ICD-10-CM | POA: Diagnosis not present

## 2021-04-02 DIAGNOSIS — I428 Other cardiomyopathies: Secondary | ICD-10-CM | POA: Diagnosis not present

## 2021-04-02 DIAGNOSIS — Z9221 Personal history of antineoplastic chemotherapy: Secondary | ICD-10-CM | POA: Insufficient documentation

## 2021-04-02 DIAGNOSIS — E7849 Other hyperlipidemia: Secondary | ICD-10-CM | POA: Diagnosis not present

## 2021-04-02 DIAGNOSIS — C50912 Malignant neoplasm of unspecified site of left female breast: Secondary | ICD-10-CM | POA: Diagnosis not present

## 2021-04-02 DIAGNOSIS — Z20822 Contact with and (suspected) exposure to covid-19: Secondary | ICD-10-CM | POA: Diagnosis not present

## 2021-04-02 DIAGNOSIS — I5022 Chronic systolic (congestive) heart failure: Secondary | ICD-10-CM | POA: Diagnosis not present

## 2021-04-02 DIAGNOSIS — I2510 Atherosclerotic heart disease of native coronary artery without angina pectoris: Secondary | ICD-10-CM | POA: Diagnosis not present

## 2021-04-02 DIAGNOSIS — Z01818 Encounter for other preprocedural examination: Secondary | ICD-10-CM

## 2021-04-02 DIAGNOSIS — D6869 Other thrombophilia: Secondary | ICD-10-CM | POA: Diagnosis not present

## 2021-04-02 DIAGNOSIS — E1165 Type 2 diabetes mellitus with hyperglycemia: Secondary | ICD-10-CM | POA: Diagnosis not present

## 2021-04-02 DIAGNOSIS — Z8673 Personal history of transient ischemic attack (TIA), and cerebral infarction without residual deficits: Secondary | ICD-10-CM | POA: Diagnosis not present

## 2021-04-02 DIAGNOSIS — I420 Dilated cardiomyopathy: Secondary | ICD-10-CM | POA: Diagnosis not present

## 2021-04-02 HISTORY — DX: Presence of cardiac pacemaker: Z95.0

## 2021-04-02 LAB — CBC
HCT: 36.4 % (ref 36.0–46.0)
Hemoglobin: 12.3 g/dL (ref 12.0–15.0)
MCH: 30.4 pg (ref 26.0–34.0)
MCHC: 33.8 g/dL (ref 30.0–36.0)
MCV: 89.9 fL (ref 80.0–100.0)
Platelets: 155 10*3/uL (ref 150–400)
RBC: 4.05 MIL/uL (ref 3.87–5.11)
RDW: 14.6 % (ref 11.5–15.5)
WBC: 6.5 10*3/uL (ref 4.0–10.5)
nRBC: 0 % (ref 0.0–0.2)

## 2021-04-02 LAB — SARS CORONAVIRUS 2 (TAT 6-24 HRS): SARS Coronavirus 2: NEGATIVE

## 2021-04-02 NOTE — Progress Notes (Signed)
PCP - Dr. Gar Ponto Cardiologist - Dr. Percival Spanish   PPM/ICD - [employer] Device Orders - Device Information:   Clinic EP Physician:  [name], MD    Device Type:  Pacemaker Manufacturer and Phone #:  [employer]: [phone] Pacemaker Dependent?:  No. Date of Last Device Check:  02/21/21 Remote;  06/20/20 in -clinic    Normal Device Function?:  Yes.     Electrophysiologist's Recommendations:   ? Have magnet available. ? Provide continuous ECG monitoring when magnet is used or reprogramming is to be performed.  ? Procedure will likely interfere with device function.  Device should be programmed:  Asynchronous pacing during procedure and returned to normal programming after procedure   Rep Notified - Windle Guard, [employer] rep made aware of surgery date and time at 1241 on 04/02/21.  Chest x-ray - n/a EKG - 12/14/20 Stress Test - denies ECHO - 03/15/21 Cardiac Cath - denies  Sleep Study - denies CPAP - denies  Pt has not been diagnosed as diabetic but pt had a recent A1C of 6.7 on 03/29/21. Pt brought in labs from PCP office. Copy of CMP, lipid panel and A1C in shadow chart.  Blood Thinner Instructions: Coumadin, last dose 03/30/21. Pt is currently bridging with lovenox. Last dose of lovenox will be 04/04/21. Aspirin Instructions: n/a  ERAS Protcol -Clear liquids until 0430 DOS. PRE-SURGERY Ensure or G2- none ordered.  COVID TEST- 04/02/21 done in PAT.  Anesthesia review: Yes, cardiac history.   Patient denies shortness of breath, fever, cough and chest pain at PAT appointment   All instructions explained to the patient, with a verbal understanding of the material. Patient agrees to go over the instructions while at home for a better understanding. Patient also instructed to self quarantine after being tested for COVID-19. The opportunity to ask questions was provided.

## 2021-04-03 NOTE — Progress Notes (Signed)
Anesthesia Chart Review:  Case: [id number] Date/Time: 02/[name]/23 0715   Procedure: LEFT MASTECTOMY WITH SENTINEL LYMPH NODE BIOPSY (Left: Breast)   Anesthesia type: General   Pre-op diagnosis: LEFT BREAST CANCER   Location: [place] OR ROOM 01 / [place] OR   Surgeons: Coralie Keens, MD       DISCUSSION: Patient is a 71 year old [name] [name] scheduled for the above procedure.  History includes never smoker, non-ischemic cardiomyopathy (diagnosed 2010; "+ LAMIN gene mutation"), chronic systolic CHF, AICD (dual chamber St. Jude ICD 05/26/08; generator replacement 08/06/15), CVA (right MCA 03/09/08), breast cancer (right breast cancer ~ 1994, s/p surgery & chemotherapy; s/p replacement of ruptured right breast implant, mastopexy left breast 05/31/15; left breast cancer 02/2021).   Preoperative cardiology input outlined by [name], [name] on 03/14/21: "Patient was contacted 03/14/2021 in reference to pre-operative risk assessment for pending surgery as outlined below.  ALANNAH [name] [name] was last seen on 12/14/2020 by Dr. Minus Breeding.  Since that day, [name] [name] [name] has done well without exertional chest pain or worsening dyspnea.  She can accomplish more than 4 METS of activity.   The case has been discussed with Dr. [name], she is cleared to proceed with surgery from a cardiac perspective.  We will obtain a repeat her echocardiogram, however echocardiogram does not need to be done prior to the surgery.   Therefore, based on ACC/AHA guidelines, the patient would be at acceptable risk for the planned procedure without further cardiovascular testing."  She did have the echo on 03/15/21 with stable EF 35-40%.  Per pharmacist Fuller Canada, RPH-CPP  on 03/13/21, "Pt takes warfarin for hx of cardioembolic stroke. She has been bridged with Lovenox for past procedures (2017-2018) per Dr Hochrein's recommendation. Recommend she hold warfarin for 5 days with Lovenox bridge for this procedure as well..." - Patient reported last  warfarin 03/30/21 with plan for last Lovenox 04/04/21. Next INR recheck at [facility] is scheduled for 04/10/21.   Perioperative ICD Rx per EP: Device Information: Clinic EP Physician:  [name] Axe, MD    Device Type:  Pacemaker Manufacturer and Phone #:  St. Jude/Abbott: [phone] Pacemaker Dependent?:  No. Date of Last Device Check:  02/21/21 Remote;  06/20/20 in -clinic    Normal Device Function?:  Yes.     Electrophysiologist's Recommendations: Have magnet available. Provide continuous ECG monitoring when magnet is used or reprogramming is to be performed.  Procedure will likely interfere with device function.  Device should be programmed:  Asynchronous pacing during procedure and returned to normal programming after procedure  04/02/2021 presurgical COVID-19 test negative.  03/29/2021 INR 3.3, but warfarin not held until 03/30/21. Will order a STAT PT/INR for the day of surgery.   VS: BP (!) 114/57    Pulse (!) 59    Temp 36.4 C (Oral)    Resp 16    Ht '5\' 8"'  (1.727 m)    Wt 76.4 kg    SpO2 99%    BMI 25.62 kg/m    PROVIDERS: Caryl Bis, MD is PCP - Minus Breeding, MD is cardiologist. Last visit 12/14/20. She was on optimal medical therapy and "does not tolerate further med titration". Appears euvolemic. No medication changes made. Continue warfarin for CVA thought to be from cardioembolic source in 9379. Six month follow-up planned.  [name] Axe, MD is EP cardiologist. Last visit 06/20/20.  [name] Loll, MD is HEM-ONC [name] Dross, MD is RAD-ONC   LABS: CBC 04/02/21 normal. CMP 03/04/21 ([facility])  showed sodium 144, potassium 4.3, CO2 32.9, BUN 22, creatinine 1.10, EGFR 55, glucose 108, calcium 9.9, albumin 4.0, total bilirubin 0.6, AST 23, ALT 31, alkaline phosphatase 49. She is for PT/INR on the day of surgery.  Labs Reviewed  SARS CORONAVIRUS 2 (TAT 6-[name] HRS)  CBC    EKG: 12/14/20 ([facility]): Atrial paced rhythm with prolonged AV conduction. LBBB.     CV: Echo 03/15/21: IMPRESSIONS   1. Left ventricular ejection fraction, by estimation, is 35 to 40%. The  left ventricle has moderately decreased function. The left ventricle  demonstrates global hypokinesis. The left ventricular internal cavity size  was mildly dilated. Left ventricular  diastolic parameters are consistent with Grade I diastolic dysfunction  (impaired relaxation).   2. Right ventricular systolic function is normal. The right ventricular  size is normal. There is normal pulmonary artery systolic pressure.   3. Left atrial size was moderately dilated.   4. The mitral valve is normal in structure. Trivial mitral valve  regurgitation. No evidence of mitral stenosis.   5. The aortic valve is tricuspid. Aortic valve regurgitation is not  visualized. No aortic stenosis is present.   6. The inferior vena cava is normal in size with greater than 50%  respiratory variability, suggesting right atrial pressure of 3 mmHg.  - Comparison LVEF 40-45% 2/9//21; 45-50% 07/30/16; 25-30% 04/02/15; 35-40% 01/15/12; 25-30% 05/18/08; 15-20% 03/09/08  CPX 06/11/16: Exercise testing with gas exchange shows an excellent function capacity. There is no evidence of cardiac limitation. At peak exercise, patient has reached her ventilatory limits and may have mild restrictive lung physiology due to her body habitus.   Nuclear stress test 03/30/08: Overall Impression Exercise Capacity: Adenosine study with no exercise. ECG Impression: No significant ST segment change suggestive of ischemia. Overall Impression: There is no sign of scar or ischemia.    Past Medical History:  Diagnosis Date   AICD (automatic cardioverter/defibrillator) present    Arthritis    thumbs   Breast cancer ([place])    right   Cerebrovascular disease    s/p right MCA cardioemoblic infarct 9/67/89   CHF (congestive heart failure) (HCC)    Nonischemic cardiomyopathy ([place])    a. +LMNA mutation b. s/p STJ dual chamber ICD    Presence of permanent cardiac pacemaker    Stroke ([name]) 02/2008   no deficits    Past Surgical History:  Procedure Laterality Date   ABDOMINAL HYSTERECTOMY     BREAST IMPLANT REMOVAL Right 05/31/2015   Procedure: REMOVAL RIGHT  BREAST IMPLANTS;  Surgeon: Crissie Reese, MD;  Location: [place];  Service: Plastics;  Laterality: Right;   BREAST RECONSTRUCTION Right 1995   BREAST RECONSTRUCTION Right 05/31/2015   Procedure: RIGHT BREAST RECONSTRUCTION WITH SALINE IMPLANTS ;  Surgeon: Crissie Reese, MD;  Location: [place];  Service: Plastics;  Laterality: Right;   CARDIAC DEFIBRILLATOR PLACEMENT  2010   STJ dual chamber ICD implanted for NICM by Dr Caryl Comes   CARDIAC DEFIBRILLATOR PLACEMENT  05/26/2008   CATARACT EXTRACTION W/PHACO Right 03/19/2018   Procedure: CATARACT EXTRACTION PHACO AND INTRAOCULAR LENS PLACEMENT (IOC);  Surgeon: Baruch Goldmann, MD;  Location: [facility];  Service: Ophthalmology;  Laterality: Right;  CDE: 5.42   CATARACT EXTRACTION W/PHACO Left 10/01/2018   Procedure: CATARACT EXTRACTION PHACO AND INTRAOCULAR LENS PLACEMENT (IOC);  Surgeon: Baruch Goldmann, MD;  Location: [facility];  Service: Ophthalmology;  Laterality: Left;  left, CDE: 6.06   CHOLECYSTECTOMY     COLONOSCOPY     EP IMPLANTABLE DEVICE N/A  08/06/2015   Procedure: ICD Generator Changeout;  Surgeon: Deboraha Sprang, MD;  Location: [place] [facility];  Service: Cardiovascular;  Laterality: N/A;   ESOPHAGOGASTRODUODENOSCOPY     MASTECTOMY Right    MASTOPEXY Left 05/31/2015   Procedure: LEFT BREAST MASTOPEXY;  Surgeon: Crissie Reese, MD;  Location: [place];  Service: Plastics;  Laterality: Left;   PACEMAKER INSERTION  05/2008   TONSILLECTOMY     VARICOSE VEIN SURGERY Left    leg    MEDICATIONS:  ascorbic acid (VITAMIN C) 500 MG tablet   atorvastatin (LIPITOR) 20 MG tablet   BIOTIN PO   digoxin (LANOXIN) 0.125 MG tablet   enoxaparin (LOVENOX) 120 MG/0.8ML injection   furosemide (LASIX) 40 MG tablet   metoprolol succinate  (TOPROL-XL) 50 MG [name] hr tablet   Multiple Vitamin (MULTIVITAMIN WITH MINERALS) TABS tablet   nitrofurantoin (MACRODANTIN) 50 MG capsule   Omega-3 Fatty Acids (FISH OIL PO)   sacubitril-valsartan (ENTRESTO) 97-103 MG   spironolactone (ALDACTONE) 25 MG tablet   warfarin (COUMADIN) 5 MG tablet   zinc gluconate 50 MG tablet   No current facility-administered medications for this encounter.    Myra Gianotti, PA-C Surgical Short Stay/Anesthesiology [facility] Phone [phone] [facility] Phone [phone] 04/03/2021 12:35 PM

## 2021-04-03 NOTE — Anesthesia Preprocedure Evaluation (Addendum)
Anesthesia Evaluation  Patient identified by MRN, date of birth, ID band Patient awake    Reviewed: Allergy & Precautions, NPO status , Patient's Chart, lab work & pertinent test results  Airway Mallampati: II  TM Distance: >3 FB Neck ROM: Full    Dental no notable dental hx. (+) Dental Advisory Given   Pulmonary neg pulmonary ROS,    Pulmonary exam normal        Cardiovascular +CHF  Normal cardiovascular exam+ pacemaker + Cardiac Defibrillator   Echo 03/15/21: IMPRESSIONS  1. Left ventricular ejection fraction, by estimation, is 35 to 40%. The  left ventricle has moderately decreased function. The left ventricle  demonstrates global hypokinesis. The left ventricular internal cavity size  was mildly dilated. Left ventricular  diastolic parameters are consistent with Grade I diastolic dysfunction  (impaired relaxation).  2. Right ventricular systolic function is normal. The right ventricular  size is normal. There is normal pulmonary artery systolic pressure.  3. Left atrial size was moderately dilated.  4. The mitral valve is normal in structure. Trivial mitral valve  regurgitation. No evidence of mitral stenosis.  5. The aortic valve is tricuspid. Aortic valve regurgitation is not  visualized. No aortic stenosis is present.  6. The inferior vena cava is normal in size with greater than 50%  respiratory variability, suggesting right atrial pressure of 3 mmHg.  - Comparison LVEF 40-45% 2/9//21; 45-50% 07/30/16; 25-30% 04/02/15; 35-40% 01/15/12; 25-30% 05/18/08; 15-20% 03/09/08    Neuro/Psych CVA, No Residual Symptoms    GI/Hepatic negative GI ROS, Neg liver ROS,   Endo/Other  negative endocrine ROS  Renal/GU negative Renal ROS     Musculoskeletal negative musculoskeletal ROS (+)   Abdominal   Peds  Hematology negative hematology ROS (+)   Anesthesia Other Findings   Reproductive/Obstetrics                             Anesthesia Physical Anesthesia Plan  ASA: 3  Anesthesia Plan: General   Post-op Pain Management: Regional block* and Tylenol PO (pre-op)*   Induction: Intravenous  PONV Risk Score and Plan: 4 or greater and Ondansetron, Dexamethasone, Treatment may vary due to age or medical condition and Midazolam  Airway Management Planned: LMA  Additional Equipment:   Intra-op Plan:   Post-operative Plan: Extubation in OR  Informed Consent: I have reviewed the patients History and Physical, chart, labs and discussed the procedure including the risks, benefits and alternatives for the proposed anesthesia with the patient or authorized representative who has indicated his/her understanding and acceptance.     Dental advisory given  Plan Discussed with: Anesthesiologist and CRNA  Anesthesia Plan Comments: (PAT note written 04/03/2021 by Myra Gianotti, PA-C. Device Information: Clinic EP Physician:Steven Caryl Comes, MD  Device Type:Pacemaker Manufacturer and Phone #:St. Jude/Abbott: [phone] Pacemaker Dependent?:No. Date of Last Device Check:02/21/21 Remote; 06/20/20 in -clinicNormal Device Function?:Yes.  Electrophysiologist's Recommendations: ? Have magnet available. ? Provide continuous ECG monitoring when magnet is used or reprogramming is to be performed. ? Procedure will likely interfere with device function. Device should be programmed: Asynchronous pacing during procedure and returned to normal programming after procedure)      Anesthesia Quick Evaluation

## 2021-04-03 NOTE — Progress Notes (Signed)
Bryan from [facility] arrived on 2/22 for procedure but case is scheduled for 2/24 at 0730.  Gaspar Bidding is aware to return on 2/24 prior to scheduled procedure

## 2021-04-04 NOTE — H&P (Signed)
REFERRING PHYSICIAN: [name], MD  PROVIDER: Beverlee Nims, MD  MRN: [id number] DOB: Sep 17, 1950 DATE OF ENCOUNTER: 03/12/2021 Subjective   Chief Complaint: new breast cancer   History of Present Illness: [name] [name] is a 71 y.o. female who is seen  as an office consultation at the request of Dr. Laurance Flatten for evaluation of new breast cancer .   This is a 71 year old female who presented with a palpable left breast mass. She underwent mammograms and ultrasound showing a 1.2 cm mass at the 4 o'clock position. She underwent a biopsy showing an invasive mammary carcinoma which was 95% ER and 90% PR positive with a Ki-67 of 20%. HER2 is pending. She has a previous history of right breast cancer and had a mastectomy with reconstruction and a complete axillary dissection over 20 years ago. She has a significant cardiac history with CHF and her last ejection fraction was around 25%. She is followed closely by cardiology. She has a pacemaker in place and is on Coumadin. Currently she denies chest pain or shortness of breath  Review of Systems: A complete review of systems was obtained from the patient. I have reviewed this information and discussed as appropriate with the patient. See HPI as well for other ROS.  ROS   Medical History: Past Medical History:  Diagnosis Date   CHF (congestive heart failure) (CMS-HCC)   History of cancer   History of stroke   There is no problem list on file for this patient.  Past Surgical History:  Procedure Laterality Date   replace breast implant 05/2015   CHOLECYSTECTOMY   CONVERSION OF SINGLE CHAMBER PACEMAKER TO DUAL CHAMBER PACEMAKER   HYSTERECTOMY   MASTECTOMY    No Known Allergies  Current Outpatient Medications on File Prior to Visit  Medication Sig Dispense Refill   digoxin (LANOXIN) 0.125 MG tablet Take 1 tablet by mouth once daily   sacubitriL-valsartan (ENTRESTO) 97-103 mg tablet Take 1 tablet by mouth 2 (two)  times daily   ascorbic acid, vitamin C, (VITAMIN C) 500 MG tablet Take by mouth   atorvastatin (LIPITOR) 20 MG tablet Take 20 mg by mouth once daily   biotin 100 mg/gram Powd Take 1 tablet by mouth once daily   FUROsemide (LASIX) 40 MG tablet Take 40 mg by mouth once daily   GRAPE SEED EXTRACT ORAL Take by mouth   metoprolol succinate (TOPROL-XL) 50 MG XL tablet Take 50 mg by mouth 2 (two) times daily   multivit-min-folic ac-collagen (WOMEN'S MULTIVITAMIN COLLAGEN) 200 mcg- 25 mg Chew Take by mouth   multivitamin with iron-minerals (SUPER THERA VITE M) tablet Take 1 tablet by mouth every morning   multivitamin with minerals tablet Take 1 tablet by mouth once daily   omega-3 fatty acids-fish oil 300-1,000 mg capsule Take 1 capsule by mouth once daily   spironolactone (ALDACTONE) 25 MG tablet Take 12.5 mg by mouth once daily   warfarin (COUMADIN) 5 MG tablet TAKE 1/2 TO 1 TABLET BY MOUTH DAILY AS DIRECTED   No current facility-administered medications on file prior to visit.   Family History  Problem Relation Age of Onset   Stroke Mother   Coronary Artery Disease (Blocked arteries around heart) Father   Coronary Artery Disease (Blocked arteries around heart) Sister   Diabetes Sister   Coronary Artery Disease (Blocked arteries around heart) Brother    Social History   Tobacco Use  Smoking Status Never  Smokeless Tobacco Never    Social History  Socioeconomic History   Marital status: Married  Tobacco Use   Smoking status: Never   Smokeless tobacco: Never  Substance and Sexual Activity   Alcohol use: Never   Drug use: Never   Objective:   Vitals:  03/12/21 1454  BP: 98/62  Pulse: 79  Temp: 36.7 C (98.1 F)  SpO2: 96%  Weight: 75.7 kg (166 lb 12.8 oz)  Height: 172.7 cm (_0 )   Body mass index is 25.36 kg/m.  Physical Exam   She appears well on exam. There is a right breast implant in place  There is a palpable, mobile mass measuring almost 1-1/2 cm at the  4 o'clock position of the left breast. There is no axillary adenopathy  There is a pacemaker on the left chest.  Lungs clear  CV RRR  Abdomen soft, NT  Neuro grossly intact  Labs, Imaging and Diagnostic Testing: I have reviewed her mammogram and ultrasound as well as pathology results and notes from the [place] center  Assessment and Plan:   Diagnoses and all orders for this visit:  Breast cancer, stage 1, estrogen receptor positive, left (CMS-HCC)    I discussed the diagnosis with the patient and her husband. She has already decided she would like to proceed with a mastectomy as possible given her previous mastectomy on the right side. I believe this is reasonable. Her issue remains her cardiac status and her clearance for general anesthetic. She would require mastectomy and sentinel lymph node biopsy. She would need to be off of her Coumadin and possibly bridged with Lovenox for the surgery. I feel like it may be risky to even consider reconstruction at the same time given her cardiac status. Should she not be a candidate for general anesthesia we would then consider just a lumpectomy under monitored sedation. Tentatively I will go ahead and do the orders for the mastectomy and sentinel node biopsy so we already have a date in place if she is cleared by cardiology. She and her husband agree with the plans.

## 2021-04-05 ENCOUNTER — Other Ambulatory Visit: Payer: Self-pay

## 2021-04-05 ENCOUNTER — Ambulatory Visit (HOSPITAL_COMMUNITY): Payer: Medicare Other | Admitting: Vascular Surgery

## 2021-04-05 ENCOUNTER — Ambulatory Visit (HOSPITAL_BASED_OUTPATIENT_CLINIC_OR_DEPARTMENT_OTHER): Payer: Medicare Other | Admitting: Anesthesiology

## 2021-04-05 ENCOUNTER — Observation Stay (HOSPITAL_COMMUNITY)
Admission: RE | Admit: 2021-04-05 | Discharge: 2021-04-06 | Disposition: A | Discharge status: Home / Self Care | Payer: Medicare Other | Attending: Surgery | Admitting: Surgery

## 2021-04-05 ENCOUNTER — Encounter (HOSPITAL_COMMUNITY)
Admission: RE | Disposition: A | Discharge status: Home / Self Care | Payer: Self-pay | Source: Home / Self Care | Attending: Surgery

## 2021-04-05 ENCOUNTER — Encounter (HOSPITAL_COMMUNITY): Payer: Self-pay | Admitting: Surgery

## 2021-04-05 DIAGNOSIS — Z9011 Acquired absence of right breast and nipple: Secondary | ICD-10-CM | POA: Insufficient documentation

## 2021-04-05 DIAGNOSIS — G8918 Other acute postprocedural pain: Secondary | ICD-10-CM | POA: Diagnosis not present

## 2021-04-05 DIAGNOSIS — I509 Heart failure, unspecified: Secondary | ICD-10-CM | POA: Insufficient documentation

## 2021-04-05 DIAGNOSIS — Z79899 Other long term (current) drug therapy: Secondary | ICD-10-CM | POA: Diagnosis not present

## 2021-04-05 DIAGNOSIS — Z7901 Long term (current) use of anticoagulants: Secondary | ICD-10-CM | POA: Diagnosis not present

## 2021-04-05 DIAGNOSIS — L570 Actinic keratosis: Secondary | ICD-10-CM | POA: Diagnosis not present

## 2021-04-05 DIAGNOSIS — Z9012 Acquired absence of left breast and nipple: Secondary | ICD-10-CM

## 2021-04-05 DIAGNOSIS — Z950 Presence of cardiac pacemaker: Secondary | ICD-10-CM | POA: Insufficient documentation

## 2021-04-05 DIAGNOSIS — Z8673 Personal history of transient ischemic attack (TIA), and cerebral infarction without residual deficits: Secondary | ICD-10-CM | POA: Insufficient documentation

## 2021-04-05 DIAGNOSIS — I2510 Atherosclerotic heart disease of native coronary artery without angina pectoris: Secondary | ICD-10-CM

## 2021-04-05 DIAGNOSIS — C50912 Malignant neoplasm of unspecified site of left female breast: Principal | ICD-10-CM | POA: Insufficient documentation

## 2021-04-05 DIAGNOSIS — Z170 Estrogen receptor positive status [ER+]: Secondary | ICD-10-CM | POA: Diagnosis not present

## 2021-04-05 DIAGNOSIS — C50512 Malignant neoplasm of lower-outer quadrant of left female breast: Secondary | ICD-10-CM | POA: Diagnosis not present

## 2021-04-05 DIAGNOSIS — N6011 Diffuse cystic mastopathy of right breast: Secondary | ICD-10-CM | POA: Diagnosis not present

## 2021-04-05 DIAGNOSIS — N62 Hypertrophy of breast: Secondary | ICD-10-CM | POA: Diagnosis not present

## 2021-04-05 HISTORY — PX: SENTINEL NODE BIOPSY: SHX6608

## 2021-04-05 HISTORY — DX: Acquired absence of left breast and nipple: Z90.12

## 2021-04-05 HISTORY — PX: MASTECTOMY W/ SENTINEL NODE BIOPSY: SHX2001

## 2021-04-05 LAB — CBC
HCT: 33.1 % — ABNORMAL LOW (ref 36.0–46.0)
Hemoglobin: 10.9 g/dL — ABNORMAL LOW (ref 12.0–15.0)
MCH: 29.7 pg (ref 26.0–34.0)
MCHC: 32.9 g/dL (ref 30.0–36.0)
MCV: 90.2 fL (ref 80.0–100.0)
Platelets: 120 10*3/uL — ABNORMAL LOW (ref 150–400)
RBC: 3.67 MIL/uL — ABNORMAL LOW (ref 3.87–5.11)
RDW: 14.6 % (ref 11.5–15.5)
WBC: 6.3 10*3/uL (ref 4.0–10.5)
nRBC: 0 % (ref 0.0–0.2)

## 2021-04-05 LAB — CREATININE, SERUM
Creatinine, Ser: 0.9 mg/dL (ref 0.44–1.00)
GFR, Estimated: >60 mL/min (ref >60)

## 2021-04-05 LAB — PROTIME-INR
INR: 1.2 (ref 0.8–1.2)
Prothrombin Time: 15 seconds (ref 11.4–15.2)

## 2021-04-05 SURGERY — MASTECTOMY WITH SENTINEL LYMPH NODE BIOPSY
Anesthesia: General | Site: Breast | Laterality: Left

## 2021-04-05 MED ORDER — CHLORHEXIDINE GLUCONATE 0.12 % MT SOLN
15.0000 mL | Freq: Once | OROMUCOSAL | Status: AC
  Administered 2021-04-05: 15 mL via OROMUCOSAL
  Filled 2021-04-05: qty 15

## 2021-04-05 MED ORDER — MAGTRACE LYMPHATIC TRACER
INTRAMUSCULAR | Status: DC | PRN
  Administered 2021-04-05: 2 mL via INTRAMUSCULAR

## 2021-04-05 MED ORDER — DEXAMETHASONE SODIUM PHOSPHATE 10 MG/ML IJ SOLN
INTRAMUSCULAR | Status: AC
  Filled 2021-04-05: qty 1

## 2021-04-05 MED ORDER — ROCURONIUM BROMIDE 10 MG/ML (PF) SYRINGE
PREFILLED_SYRINGE | INTRAVENOUS | Status: AC
  Filled 2021-04-05: qty 20

## 2021-04-05 MED ORDER — BUPIVACAINE HCL (PF) 0.5 % IJ SOLN
INTRAMUSCULAR | Status: DC | PRN
  Administered 2021-04-05: 15 mL

## 2021-04-05 MED ORDER — MIDAZOLAM HCL 2 MG/2ML IJ SOLN
INTRAMUSCULAR | Status: AC
  Filled 2021-04-05: qty 2

## 2021-04-05 MED ORDER — HYDROMORPHONE HCL 1 MG/ML IJ SOLN
0.5000 mg | INTRAMUSCULAR | Status: DC | PRN
  Administered 2021-04-05: 0.5 mg via INTRAVENOUS
  Filled 2021-04-05: qty 0.5

## 2021-04-05 MED ORDER — ORAL CARE MOUTH RINSE: 15.0000 mL | Freq: Once | OROMUCOSAL | Status: AC

## 2021-04-05 MED ORDER — ONDANSETRON HCL 4 MG/2ML IJ SOLN
INTRAMUSCULAR | Status: AC
  Filled 2021-04-05: qty 2

## 2021-04-05 MED ORDER — ONDANSETRON HCL 4 MG/2ML IJ SOLN: 4.0000 mg | Freq: Four times a day (QID) | INTRAMUSCULAR | Status: DC | PRN

## 2021-04-05 MED ORDER — LIDOCAINE 2% (20 MG/ML) 5 ML SYRINGE
INTRAMUSCULAR | Status: AC
  Filled 2021-04-05: qty 15

## 2021-04-05 MED ORDER — DEXAMETHASONE SODIUM PHOSPHATE 10 MG/ML IJ SOLN
INTRAMUSCULAR | Status: AC
  Filled 2021-04-05: qty 3

## 2021-04-05 MED ORDER — PROPOFOL 10 MG/ML IV BOLUS
INTRAVENOUS | Status: AC
  Filled 2021-04-05: qty 20

## 2021-04-05 MED ORDER — DIPHENHYDRAMINE HCL 50 MG/ML IJ SOLN: 12.5000 mg | Freq: Four times a day (QID) | INTRAMUSCULAR | Status: DC | PRN

## 2021-04-05 MED ORDER — EPHEDRINE SULFATE-NACL 50-0.9 MG/10ML-% IV SOSY
PREFILLED_SYRINGE | INTRAVENOUS | Status: DC | PRN
Start: 2021-04-05 — End: 2021-04-05
  Administered 2021-04-05: 10 mg via INTRAVENOUS
  Administered 2021-04-05: 5 mg via INTRAVENOUS
  Administered 2021-04-05: 10 mg via INTRAVENOUS

## 2021-04-05 MED ORDER — ONDANSETRON HCL 4 MG/2ML IJ SOLN
INTRAMUSCULAR | Status: AC
  Filled 2021-04-05: qty 6

## 2021-04-05 MED ORDER — FENTANYL CITRATE (PF) 250 MCG/5ML IJ SOLN
INTRAMUSCULAR | Status: AC
  Filled 2021-04-05: qty 5

## 2021-04-05 MED ORDER — ROCURONIUM BROMIDE 10 MG/ML (PF) SYRINGE
PREFILLED_SYRINGE | INTRAVENOUS | Status: AC
  Filled 2021-04-05: qty 10

## 2021-04-05 MED ORDER — SODIUM CHLORIDE 0.9 % IV SOLN: INTRAVENOUS | Status: DC

## 2021-04-05 MED ORDER — SPIRONOLACTONE 12.5 MG HALF TABLET
12.5000 mg | ORAL_TABLET | Freq: Every day | ORAL | Status: DC
  Administered 2021-04-05 – 2021-04-06 (×2): 12.5 mg via ORAL
  Filled 2021-04-05 (×2): qty 1

## 2021-04-05 MED ORDER — CHLORHEXIDINE GLUCONATE CLOTH 2 % EX PADS: 6.0000 | MEDICATED_PAD | Freq: Once | CUTANEOUS | Status: DC

## 2021-04-05 MED ORDER — TRAMADOL HCL 50 MG PO TABS
50.0000 mg | ORAL_TABLET | Freq: Four times a day (QID) | ORAL | Status: DC | PRN
  Administered 2021-04-05: 50 mg via ORAL
  Filled 2021-04-05: qty 1

## 2021-04-05 MED ORDER — DEXAMETHASONE SODIUM PHOSPHATE 10 MG/ML IJ SOLN
INTRAMUSCULAR | Status: DC | PRN
  Administered 2021-04-05: 8 mg via INTRAVENOUS

## 2021-04-05 MED ORDER — EPHEDRINE 5 MG/ML INJ
INTRAVENOUS | Status: AC
  Filled 2021-04-05: qty 5

## 2021-04-05 MED ORDER — ONDANSETRON 4 MG PO TBDP: 4.0000 mg | ORAL_TABLET | Freq: Four times a day (QID) | ORAL | Status: DC | PRN

## 2021-04-05 MED ORDER — KETOROLAC TROMETHAMINE 30 MG/ML IJ SOLN
INTRAMUSCULAR | Status: AC
  Filled 2021-04-05: qty 1

## 2021-04-05 MED ORDER — ONDANSETRON HCL 4 MG/2ML IJ SOLN
INTRAMUSCULAR | Status: DC | PRN
  Administered 2021-04-05: 4 mg via INTRAVENOUS

## 2021-04-05 MED ORDER — FENTANYL CITRATE (PF) 250 MCG/5ML IJ SOLN
INTRAMUSCULAR | Status: DC | PRN
  Administered 2021-04-05: 50 ug via INTRAVENOUS
  Administered 2021-04-05: 25 ug via INTRAVENOUS
  Administered 2021-04-05: 50 ug via INTRAVENOUS

## 2021-04-05 MED ORDER — PROCHLORPERAZINE EDISYLATE 10 MG/2ML IJ SOLN: 10.0000 mg | Freq: Once | INTRAMUSCULAR | Status: DC

## 2021-04-05 MED ORDER — CEFAZOLIN SODIUM-DEXTROSE 2-4 GM/100ML-% IV SOLN
2.0000 g | INTRAVENOUS | Status: AC
  Administered 2021-04-05: 2 g via INTRAVENOUS
  Filled 2021-04-05: qty 100

## 2021-04-05 MED ORDER — DIGOXIN 125 MCG PO TABS
125.0000 ug | ORAL_TABLET | Freq: Every day | ORAL | Status: DC
  Administered 2021-04-06: 125 ug via ORAL
  Filled 2021-04-05: qty 1

## 2021-04-05 MED ORDER — PHENYLEPHRINE 40 MCG/ML (10ML) SYRINGE FOR IV PUSH (FOR BLOOD PRESSURE SUPPORT)
PREFILLED_SYRINGE | INTRAVENOUS | Status: AC
  Filled 2021-04-05: qty 10

## 2021-04-05 MED ORDER — DIPHENHYDRAMINE HCL 12.5 MG/5ML PO ELIX: 12.5000 mg | ORAL_SOLUTION | Freq: Four times a day (QID) | ORAL | Status: DC | PRN

## 2021-04-05 MED ORDER — ACETAMINOPHEN 500 MG PO TABS
1000.0000 mg | ORAL_TABLET | Freq: Once | ORAL | Status: AC
  Administered 2021-04-05: 1000 mg via ORAL
  Filled 2021-04-05: qty 2

## 2021-04-05 MED ORDER — LACTATED RINGERS IV SOLN: INTRAVENOUS | Status: DC

## 2021-04-05 MED ORDER — PHENYLEPHRINE 40 MCG/ML (10ML) SYRINGE FOR IV PUSH (FOR BLOOD PRESSURE SUPPORT)
PREFILLED_SYRINGE | INTRAVENOUS | Status: DC | PRN
Start: 2021-04-05 — End: 2021-04-05
  Administered 2021-04-05: 120 ug via INTRAVENOUS
  Administered 2021-04-05: 200 ug via INTRAVENOUS
  Administered 2021-04-05: 80 ug via INTRAVENOUS

## 2021-04-05 MED ORDER — MIDAZOLAM HCL 2 MG/2ML IJ SOLN
INTRAMUSCULAR | Status: DC | PRN
  Administered 2021-04-05 (×2): 1 mg via INTRAVENOUS

## 2021-04-05 MED ORDER — 0.9 % SODIUM CHLORIDE (POUR BTL) OPTIME
TOPICAL | Status: DC | PRN
  Administered 2021-04-05: 1000 mL

## 2021-04-05 MED ORDER — ACETAMINOPHEN 500 MG PO TABS
1000.0000 mg | ORAL_TABLET | Freq: Four times a day (QID) | ORAL | Status: DC
  Administered 2021-04-05 – 2021-04-06 (×4): 1000 mg via ORAL
  Filled 2021-04-05 (×4): qty 2

## 2021-04-05 MED ORDER — PROPOFOL 10 MG/ML IV BOLUS
INTRAVENOUS | Status: DC | PRN
  Administered 2021-04-05: 160 mg via INTRAVENOUS

## 2021-04-05 MED ORDER — METOPROLOL SUCCINATE ER 25 MG PO TB24
50.0000 mg | ORAL_TABLET | Freq: Two times a day (BID) | ORAL | Status: DC
  Administered 2021-04-06: 50 mg via ORAL
  Filled 2021-04-05 (×2): qty 2

## 2021-04-05 MED ORDER — FENTANYL CITRATE (PF) 100 MCG/2ML IJ SOLN
INTRAMUSCULAR | Status: AC
  Filled 2021-04-05: qty 2

## 2021-04-05 MED ORDER — ENOXAPARIN SODIUM 40 MG/0.4ML IJ SOSY
40.0000 mg | PREFILLED_SYRINGE | INTRAMUSCULAR | Status: DC
  Administered 2021-04-06: 40 mg via SUBCUTANEOUS
  Filled 2021-04-05: qty 0.4

## 2021-04-05 MED ORDER — PHENYLEPHRINE 40 MCG/ML (10ML) SYRINGE FOR IV PUSH (FOR BLOOD PRESSURE SUPPORT)
PREFILLED_SYRINGE | INTRAVENOUS | Status: AC
  Filled 2021-04-05: qty 30

## 2021-04-05 MED ORDER — OXYCODONE HCL 5 MG PO TABS: 5.0000 mg | ORAL_TABLET | ORAL | Status: DC | PRN

## 2021-04-05 MED ORDER — AMISULPRIDE (ANTIEMETIC) 5 MG/2ML IV SOLN: 10.0000 mg | Freq: Once | INTRAVENOUS | Status: DC | PRN

## 2021-04-05 MED ORDER — CHLORHEXIDINE GLUCONATE CLOTH 2 % EX PADS
6.0000 | MEDICATED_PAD | Freq: Once | CUTANEOUS | Status: DC
Start: 2021-04-05 — End: 2021-04-05

## 2021-04-05 MED ORDER — FENTANYL CITRATE (PF) 100 MCG/2ML IJ SOLN
25.0000 ug | INTRAMUSCULAR | Status: DC | PRN
  Administered 2021-04-05 (×2): 25 ug via INTRAVENOUS
  Administered 2021-04-05: 50 ug via INTRAVENOUS

## 2021-04-05 MED ORDER — BUPIVACAINE LIPOSOME 1.3 % IJ SUSP
INTRAMUSCULAR | Status: DC | PRN
  Administered 2021-04-05: 10 mL

## 2021-04-05 SURGICAL SUPPLY — 49 items
APPLIER CLIP 9.375 MED OPEN (MISCELLANEOUS) ×3
BAG COUNTER SPONGE SURGICOUNT (BAG) ×3 IMPLANT
BINDER BREAST LRG (GAUZE/BANDAGES/DRESSINGS) IMPLANT
BINDER BREAST XLRG (GAUZE/BANDAGES/DRESSINGS) ×1 IMPLANT
BIOPATCH RED 1 DISK 7.0 (GAUZE/BANDAGES/DRESSINGS) ×3 IMPLANT
CANISTER SUCT 3000ML PPV (MISCELLANEOUS) ×3 IMPLANT
CHLORAPREP W/TINT 26 (MISCELLANEOUS) ×3 IMPLANT
CLIP APPLIE 9.375 MED OPEN (MISCELLANEOUS) ×2 IMPLANT
CNTNR URN SCR LID CUP LEK RST (MISCELLANEOUS) ×2 IMPLANT
CONT SPEC 4OZ STRL OR WHT (MISCELLANEOUS) ×3
COVER PROBE W GEL 5X96 (DRAPES) ×3 IMPLANT
COVER SURGICAL LIGHT HANDLE (MISCELLANEOUS) ×3 IMPLANT
DERMABOND ADVANCED (GAUZE/BANDAGES/DRESSINGS) ×1
DERMABOND ADVANCED .7 DNX12 (GAUZE/BANDAGES/DRESSINGS) ×2 IMPLANT
DRAIN CHANNEL 19F RND (DRAIN) ×3 IMPLANT
DRAPE CHEST BREAST 15X10 FENES (DRAPES) ×3 IMPLANT
DRSG PAD ABDOMINAL 8X10 ST (GAUZE/BANDAGES/DRESSINGS) ×6 IMPLANT
DRSG TEGADERM 4X4.75 (GAUZE/BANDAGES/DRESSINGS) ×3 IMPLANT
ELECT REM PT RETURN 9FT ADLT (ELECTROSURGICAL) ×3
ELECTRODE REM PT RTRN 9FT ADLT (ELECTROSURGICAL) ×2 IMPLANT
EVACUATOR SILICONE 100CC (DRAIN) ×3 IMPLANT
GAUZE SPONGE 4X4 12PLY STRL (GAUZE/BANDAGES/DRESSINGS) ×4 IMPLANT
GLOVE SURG SIGNA 7.5 PF LTX (GLOVE) ×3 IMPLANT
GOWN STRL REUS W/ TWL LRG LVL3 (GOWN DISPOSABLE) ×2 IMPLANT
GOWN STRL REUS W/ TWL XL LVL3 (GOWN DISPOSABLE) ×2 IMPLANT
GOWN STRL REUS W/TWL LRG LVL3 (GOWN DISPOSABLE) ×3
GOWN STRL REUS W/TWL XL LVL3 (GOWN DISPOSABLE) ×3
KIT BASIN OR (CUSTOM PROCEDURE TRAY) ×3 IMPLANT
KIT TURNOVER KIT B (KITS) ×3 IMPLANT
NDL 18GX1X1/2 (RX/OR ONLY) (NEEDLE) IMPLANT
NDL FILTER BLUNT 18X1 1/2 (NEEDLE) IMPLANT
NDL HYPO 25GX1X1/2 BEV (NEEDLE) IMPLANT
NEEDLE 18GX1X1/2 (RX/OR ONLY) (NEEDLE) IMPLANT
NEEDLE FILTER BLUNT 18X 1/2SAF (NEEDLE)
NEEDLE FILTER BLUNT 18X1 1/2 (NEEDLE) IMPLANT
NEEDLE HYPO 25GX1X1/2 BEV (NEEDLE) ×3 IMPLANT
NS IRRIG 1000ML POUR BTL (IV SOLUTION) ×3 IMPLANT
PACK GENERAL/GYN (CUSTOM PROCEDURE TRAY) ×3 IMPLANT
PAD ARMBOARD 7.5X6 YLW CONV (MISCELLANEOUS) ×3 IMPLANT
PENCIL SMOKE EVACUATOR (MISCELLANEOUS) ×2 IMPLANT
SPECIMEN JAR X LARGE (MISCELLANEOUS) ×3 IMPLANT
SUT ETHILON 2 0 FS 18 (SUTURE) ×3 IMPLANT
SUT MON AB 4-0 PC3 18 (SUTURE) ×4 IMPLANT
SUT SILK 2 0 SH (SUTURE) ×3 IMPLANT
SUT VIC AB 3-0 SH 18 (SUTURE) ×3 IMPLANT
SYR CONTROL 10ML LL (SYRINGE) ×1 IMPLANT
TOWEL GREEN STERILE (TOWEL DISPOSABLE) ×3 IMPLANT
TOWEL GREEN STERILE FF (TOWEL DISPOSABLE) ×3 IMPLANT
TRACER MAGTRACE VIAL (MISCELLANEOUS) ×1 IMPLANT

## 2021-04-05 NOTE — Anesthesia Postprocedure Evaluation (Signed)
Anesthesia Post Note  Patient: [name] [name]  Procedure(s) Performed: LEFT MASTECTOMY (Left: Breast) SENTINEL LYMPH NODE BIOPSY (Left: Axilla)     Anesthesia Post Evaluation No notable events documented.  Last Vitals:  Vitals:   04/05/21 1104 04/05/21 1740  BP: (!) 133/58 (!) 102/47  Pulse: (!) 53 64  Resp:  19  Temp: 36.6 C 36.5 C  SpO2: 95% 93%    Last Pain:  Vitals:   04/05/21 1740  TempSrc: Oral  PainSc:                  [place]

## 2021-04-05 NOTE — Anesthesia Procedure Notes (Signed)
Procedure Name: LMA Insertion Date/Time: 04/05/2021 7:34 AM Performed by: Barrington Ellison, CRNA Pre-anesthesia Checklist: Patient identified, Emergency Drugs available, Suction available and Patient being monitored Patient Re-evaluated:Patient Re-evaluated prior to induction Oxygen Delivery Method: Circle System Utilized Preoxygenation: Pre-oxygenation with 100% oxygen Induction Type: IV induction Ventilation: Mask ventilation without difficulty LMA: LMA inserted LMA Size: 4.0 Number of attempts: 1 Placement Confirmation: positive ETCO2 Tube secured with: Tape Dental Injury: Teeth and Oropharynx as per pre-operative assessment

## 2021-04-05 NOTE — Interval H&P Note (Signed)
History and Physical Interval Note:no change in H and P  04/05/2021 6:52 AM  [name] [name]  has presented today for surgery, with the diagnosis of LEFT BREAST CANCER.  The various methods of treatment have been discussed with the patient and family. After consideration of risks, benefits and other options for treatment, the patient has consented to  Procedure(s): LEFT MASTECTOMY WITH SENTINEL LYMPH NODE BIOPSY (Left) as a surgical intervention.  The patient's history has been reviewed, patient examined, no change in status, stable for surgery.  I have reviewed the patient's chart and labs.  Questions were answered to the patient's satisfaction.     Coralie Keens

## 2021-04-05 NOTE — Op Note (Signed)
° °  [name] [name] 04/05/2021   Pre-op Diagnosis: LEFT BREAST CANCER     Post-op Diagnosis: same  Procedure(s): LEFT MASTECTOMY DEEP LEFT AXILLARY SENTINEL LYMPH NODE BIOPSY INJECTION OF MAGTRACE  Surgeon(s): Coralie Keens, MD  Assist:  Yvone Neu, MD [facility] Resident  Anesthesia: General  Staff:  Circulator: Hal Morales, RN Relief Circulator: Rosanne Sack, RN Scrub Person: Serita Grammes Circulator Assistant: Rozell Searing, RN  Estimated Blood Loss: Minimal               Specimens: sent to path  Indications: This is a 71 year old female with a previous history of a right breast cancer status post a right mastectomy with reconstruction and a left oncoplastic reduction about 20 years ago.  She has a pacemaker in place.  She now has a left breast cancer and wished to proceed with a mastectomy and sentinel node biopsy without reconstruction  Procedure: The patient is brought to operating identifies correct patient.  She is placed upon the operating table general anesthesia was induced.  Her left breast was prepped and mag trace was injected underneath the nipple areolar complex.  The breast was then massaged for 5 minutes.  The left breast, chest and axilla were then prepped and draped in usual sterile fashion.  Elliptical incision was then made from medial to lateral incorporating the nipple areolar complex with a scalpel.  The dissection was then carried down into the subcutaneous tissue with electrocautery.  We then created the superior skin flap first going toward the clavicle and staying underneath the pacemaker.  We then took this down to the chest wall.  We then dissected out the inferior flap going down to the inframammary ridge and then down the chest wall as well.  There is moderate scarring in the lower flap from her previous surgery.  We then dissected the breast off of the pectoralis muscle moving medial to lateral with electrocautery.  Once we reached the  lateral portion we then completed the mastectomy removing the breast tissue.  The lateral margin was marked with a suture.  The mastectomy was then sent to pathology for evaluation.  Using the mag trace probe we then identified several small lymph nodes in the axilla which were excised and all sent together with the electrocautery.  There were no enlarged lymph nodes identified.  We then thoroughly irrigated the axilla and skin flaps with saline.  Hemostasis peer to be achieved.  A separate skin incision was then created and a 19 [place] Blake drain was placed through this and into the axilla and underneath the flaps.  This was sutured in place with a 2-0 nylon suture.  The subcutaneous tissue was then closed with interrupted 3-0 Vicryl sutures and skin was closed with running 4-0 Monocryl.  Dermabond, gauze, and a breast binder were then applied.  The patient tolerated the procedure well.  All the counts were correct at the end of the procedure.  The patient was then extubated in the operating room and taken in stable condition to the recovery room.          Coralie Keens   Date: 04/05/2021  Time: 8:42 AM

## 2021-04-05 NOTE — Anesthesia Procedure Notes (Signed)
Anesthesia Regional Block: Pectoralis block   Pre-Anesthetic Checklist: , timeout performed,  Correct Patient, Correct Site, Correct Laterality,  Correct Procedure, Correct Position, site marked,  Risks and benefits discussed,  Surgical consent,  Pre-op evaluation,  At surgeon's request and post-op pain management  Laterality: Left  Prep: chloraprep       Needles:  Injection technique: Single-shot  Needle Type: Echogenic Stimulator Needle     Needle Length: 10cm  Needle Gauge: 21     Additional Needles:   Procedures:,,,, ultrasound used (permanent image in chart),,    Narrative:  Start time: 04/05/2021 6:57 AM End time: 04/05/2021 7:07 AM Injection made incrementally with aspirations every 5 mL.  Performed by: Personally

## 2021-04-05 NOTE — Transfer of Care (Signed)
Immediate Anesthesia Transfer of Care Note  Patient: [name] [name]  Procedure(s) Performed: LEFT MASTECTOMY (Left: Breast) SENTINEL LYMPH NODE BIOPSY (Left: Axilla)  Patient Location: PACU  Anesthesia Type:General and Regional  Level of Consciousness: lethargic  Airway & Oxygen Therapy: Patient Spontanous Breathing and Patient connected to nasal cannula oxygen  Post-op Assessment: Report given to RN  Post vital signs: Reviewed and stable  Last Vitals:  Vitals Value Taken Time  BP 129/54 04/05/21 0856  Temp    Pulse 56 04/05/21 0856  Resp 5 04/05/21 0856  SpO2 96 % 04/05/21 0856  Vitals shown include unvalidated device data.  Last Pain:  Vitals:   04/05/21 0613  TempSrc:   PainSc: 0-No pain         Complications: No notable events documented.

## 2021-04-06 ENCOUNTER — Encounter (HOSPITAL_COMMUNITY): Payer: Self-pay | Admitting: Surgery

## 2021-04-06 DIAGNOSIS — Z8673 Personal history of transient ischemic attack (TIA), and cerebral infarction without residual deficits: Secondary | ICD-10-CM | POA: Diagnosis not present

## 2021-04-06 DIAGNOSIS — Z9011 Acquired absence of right breast and nipple: Secondary | ICD-10-CM | POA: Diagnosis not present

## 2021-04-06 DIAGNOSIS — I509 Heart failure, unspecified: Secondary | ICD-10-CM | POA: Diagnosis not present

## 2021-04-06 DIAGNOSIS — Z7901 Long term (current) use of anticoagulants: Secondary | ICD-10-CM | POA: Diagnosis not present

## 2021-04-06 DIAGNOSIS — C50912 Malignant neoplasm of unspecified site of left female breast: Secondary | ICD-10-CM | POA: Diagnosis not present

## 2021-04-06 DIAGNOSIS — Z950 Presence of cardiac pacemaker: Secondary | ICD-10-CM | POA: Diagnosis not present

## 2021-04-06 DIAGNOSIS — Z79899 Other long term (current) drug therapy: Secondary | ICD-10-CM | POA: Diagnosis not present

## 2021-04-06 LAB — BASIC METABOLIC PANEL
Anion gap: 10 (ref 5–15)
BUN: 20 mg/dL (ref 8–23)
CO2: 20 mmol/L — ABNORMAL LOW (ref 22–32)
Calcium: 8.3 mg/dL — ABNORMAL LOW (ref 8.9–10.3)
Chloride: 108 mmol/L (ref 98–111)
Creatinine, Ser: 0.82 mg/dL (ref 0.44–1.00)
GFR, Estimated: >60 mL/min (ref >60)
Glucose, Bld: 129 mg/dL — ABNORMAL HIGH (ref 70–99)
Potassium: 4.1 mmol/L (ref 3.5–5.1)
Sodium: 138 mmol/L (ref 135–145)

## 2021-04-06 LAB — DIGOXIN LEVEL: Digoxin Level: 0.4 ng/mL — ABNORMAL LOW (ref 0.8–2.0)

## 2021-04-06 MED ORDER — TRAMADOL HCL 50 MG PO TABS: 50.0000 mg | ORAL_TABLET | Freq: Four times a day (QID) | ORAL | 0 refills | Status: DC | PRN

## 2021-04-06 NOTE — Discharge Instructions (Signed)
[facility] [phone]  MASTECTOMY: POST OP INSTRUCTIONS  Always review your discharge instruction sheet given to you by the facility where your surgery was performed. IF YOU HAVE DISABILITY OR FAMILY LEAVE FORMS, YOU MUST BRING THEM TO THE OFFICE FOR PROCESSING.   DO NOT GIVE THEM TO YOUR DOCTOR. A prescription for pain medication may be given to you upon discharge.  Take your pain medication as prescribed, if needed.  If narcotic pain medicine is not needed, then you may take acetaminophen (Tylenol) or ibuprofen (Advil) as needed. Take your usually prescribed medications unless otherwise directed. If you need a refill on your pain medication, please contact your pharmacy.  They will contact our office to request authorization.  Prescriptions will not be filled after 5pm or on week-ends. You should follow a light diet the first few days after arrival home, such as soup and crackers, etc.  Resume your normal diet the day after surgery. Most patients will experience some swelling and bruising on the chest and underarm.  Ice packs will help.  Swelling and bruising can take several days to resolve.  It is common to experience some constipation if taking pain medication after surgery.  Increasing fluid intake and taking a stool softener (such as Colace) will usually help or prevent this problem from occurring.  A mild laxative (Milk of Magnesia or Miralax) should be taken according to package instructions if there are no bowel movements after 48 hours. Unless discharge instructions indicate otherwise, leave your bandage dry and in place until your next appointment in 3-5 days.  You may take a limited sponge bath.  No tube baths or showers until the drains are removed.  You may have steri-strips (small skin tapes) in place directly over the incision.  These strips should be left on the skin for 7-10 days.  If your surgeon used skin glue on the incision, you may shower in 24 hours.   The glue will flake off over the next 2-3 weeks.  Any sutures or staples will be removed at the office during your follow-up visit. DRAINS:  If you have drains in place, it is important to keep a list of the amount of drainage produced each day in your drains.  Before leaving the hospital, you should be instructed on drain care.  Call our office if you have any questions about your drains. ACTIVITIES:  You may resume regular (light) daily activities beginning the next day--such as daily self-care, walking, climbing stairs--gradually increasing activities as tolerated.  You may have sexual intercourse when it is comfortable.  Refrain from any heavy lifting or straining until approved by your doctor. You may drive when you are no longer taking prescription pain medication, you can comfortably wear a seatbelt, and you can safely maneuver your car and apply brakes. RETURN TO WORK:  __________________________________________________________ Dennis Bast should see your doctor in the office for a follow-up appointment approximately 3-5 days after your surgery.  Your doctors nurse will typically make your follow-up appointment when she calls you with your pathology report.  Expect your pathology report 2-3 business days after your surgery.  You may call to check if you do not hear from [place] after three days.   OTHER INSTRUCTIONS: OK TO REMOVE THE BINDER AND SHOWER TODAY.  YOU CAN PUT THE BINDER BACK ON AFTER OR WEAR WHAT MAKES YOU THE MOST COMFORTABLE______________________________________________________________________________________________ ____________________________________________________________________________________________ WHEN TO CALL YOUR DOCTOR: Fever over 101.0 Nausea and/or vomiting Extreme swelling or bruising Continued bleeding from incision. Increased  pain, redness, or drainage from the incision. The clinic staff is available to answer your questions during regular business hours.  Please dont  hesitate to call and ask to speak to one of the nurses for clinical concerns.  If you have a medical emergency, go to the nearest emergency room or call 911.  A surgeon from [facility] is always on call at the hospital. [address], [place], [place], [place]  [address] ? [address], [place],    [address] [phone] ? [phone] ? FAX [phone] Web site: [email or web address]

## 2021-04-06 NOTE — Progress Notes (Signed)
Pt discharged home with spouse in stable condition. Discharge instructions given. Script sent to pharmacy of choice. No immediate questions or concerns at this time. Discharged from unit via wheelchair 

## 2021-04-06 NOTE — Discharge Summary (Signed)
Physician Discharge Summary  Patient ID: [name] [name] MRN: [id number] DOB/AGE: 05/28/1950 71 y.o.  Admit date: 04/05/2021 Discharge date: 04/06/2021  Admission Diagnoses:  Discharge Diagnoses:  Principal Problem:   S/P left mastectomy   Discharged Condition: good  Hospital Course: uneventful post op recovery.  Discharged home POD#1 doing well  Consults: None  Significant Diagnostic Studies:   Treatments: surgery: left mastectomy with sentinel node biopsy  Discharge Exam: Blood pressure (!) 121/46, pulse (!) 56, temperature 97.9 F (36.6 C), resp. rate 19, height 5\' 8"  (1.727 m), weight 75.3 kg, SpO2 97 %. General appearance: alert, cooperative, and no distress Resp: clear to auscultation bilaterally Cardio: regular rate and rhythm, S1, S2 normal, no murmur, click, rub or gallop Incision/Wound: left chest skin flaps viable, drain serosang  Disposition: Discharge disposition: 01-Home or Self Care        Allergies as of 04/06/2021   No Known Allergies      Medication List     TAKE these medications    ascorbic acid 500 MG tablet Commonly known as: VITAMIN C Take 1 tablet (500 mg total) by mouth daily.   atorvastatin 20 MG tablet Commonly known as: Lipitor Take 1 tablet (20 mg total) by mouth daily.   BIOTIN PO Take 1 tablet by mouth daily.   digoxin 0.125 MG tablet Commonly known as: LANOXIN TAKE ONE TABLET BY MOUTH DAILY.   enoxaparin 120 MG/0.8ML injection Commonly known as: LOVENOX Inject 0.8 mLs (120 mg total) into the skin daily for 10 days.   Entresto 97-103 MG Generic drug: sacubitril-valsartan Take 1 tablet by mouth 2 (two) times daily.   FISH OIL PO Take 1 capsule by mouth daily.   furosemide 40 MG tablet Commonly known as: LASIX Take 0.5 tablets (20 mg total) by mouth as needed for fluid or edema (q24 hrs as needed). What changed:  how much to take when to take this reasons to take this   metoprolol succinate 50 MG 24 hr  tablet Commonly known as: TOPROL-XL TAKE ONE TABLET BY MOUTH TWICE DAILY   multivitamin with minerals Tabs tablet Take 1 tablet by mouth daily.   nitrofurantoin 50 MG capsule Commonly known as: MACRODANTIN Take 50 mg by mouth daily as needed (UTI).   spironolactone 25 MG tablet Commonly known as: ALDACTONE TAKE 1/2 TABLET BY MOUTH DAILY.   warfarin 5 MG tablet Commonly known as: COUMADIN Take as directed. If you are unsure how to take this medication, talk to your nurse or doctor. Original instructions: TAKE 1/2 TO 1 TABLET BY MOUTH DAILY AS DIRECTED What changed:  how much to take how to take this when to take this additional instructions   zinc gluconate 50 MG tablet Take 50 mg by mouth daily.        Follow-up Information     Coralie Keens, MD Follow up in 2 week(s).   Specialty: General Surgery Contact information: [address] [place] [address] [phone]                 Signed: Coralie Keens 04/06/2021, 7:52 AM

## 2021-04-06 NOTE — Progress Notes (Signed)
Reviewed breast cancer bag contents and general expectations following surgery and JP drain care. Pt had a JP in the past and has a good understanding of what to do. Pt has a chart to take to Dr. Ninfa Linden on her follow up visit.

## 2021-04-08 LAB — SURGICAL PATHOLOGY

## 2021-04-10 ENCOUNTER — Ambulatory Visit (INDEPENDENT_AMBULATORY_CARE_PROVIDER_SITE_OTHER): Payer: Medicare Other | Admitting: *Deleted

## 2021-04-10 DIAGNOSIS — Z5181 Encounter for therapeutic drug level monitoring: Secondary | ICD-10-CM | POA: Diagnosis not present

## 2021-04-10 DIAGNOSIS — I6350 Cerebral infarction due to unspecified occlusion or stenosis of unspecified cerebral artery: Secondary | ICD-10-CM

## 2021-04-10 LAB — POCT INR: INR: 1.3 — AB (ref 2.0–3.0)

## 2021-04-10 MED ORDER — ENOXAPARIN SODIUM 120 MG/0.8ML IJ SOSY: 120.0000 mg | PREFILLED_SYRINGE | INTRAMUSCULAR | 0 refills | Status: DC

## 2021-04-10 NOTE — Patient Instructions (Signed)
Take warfarin 1 tablet Wednesdays thru Saturday then resume 1/2 tablet daily except 1 tablet on Mondays and Thursdays ?Continue Lovenox ?Recheck on 04/16/21 ?

## 2021-04-16 ENCOUNTER — Ambulatory Visit (INDEPENDENT_AMBULATORY_CARE_PROVIDER_SITE_OTHER): Payer: Medicare Other | Admitting: *Deleted

## 2021-04-16 DIAGNOSIS — I6350 Cerebral infarction due to unspecified occlusion or stenosis of unspecified cerebral artery: Secondary | ICD-10-CM | POA: Diagnosis not present

## 2021-04-16 DIAGNOSIS — Z5181 Encounter for therapeutic drug level monitoring: Secondary | ICD-10-CM | POA: Diagnosis not present

## 2021-04-16 LAB — POCT INR: INR: 3.3 — AB (ref 2.0–3.0)

## 2021-04-16 NOTE — Patient Instructions (Signed)
Hold warfarin tonight then resume 1/2 tablet daily except 1 tablet on Mondays and Thursdays ?Stop Lovenox ?Recheck in 3 wks ?

## 2021-04-23 ENCOUNTER — Other Ambulatory Visit: Payer: Self-pay | Admitting: Cardiology

## 2021-04-23 DIAGNOSIS — C50912 Malignant neoplasm of unspecified site of left female breast: Secondary | ICD-10-CM | POA: Diagnosis not present

## 2021-04-23 DIAGNOSIS — Z170 Estrogen receptor positive status [ER+]: Secondary | ICD-10-CM | POA: Diagnosis not present

## 2021-04-23 DIAGNOSIS — Z5181 Encounter for therapeutic drug level monitoring: Secondary | ICD-10-CM

## 2021-04-23 NOTE — Telephone Encounter (Signed)
Prescription refill request received for warfarin ?Lov: 12/14/2020, [place] ?Next INR check: 3/28 ?Warfarin tablet strength: '5mg'$   ?

## 2021-04-24 ENCOUNTER — Encounter (HOSPITAL_COMMUNITY): Payer: Self-pay

## 2021-04-24 IMAGING — DX DG CHEST 1V PORT
1 series · 1 of 1 positions shown · non-contrast
Comparison: May 27, 2008.

CLINICAL DATA: Cough and shortness of breath. [id number] positive.
Decreased oxygen saturation

EXAM:
PORTABLE CHEST 1 VIEW

[chest ap]
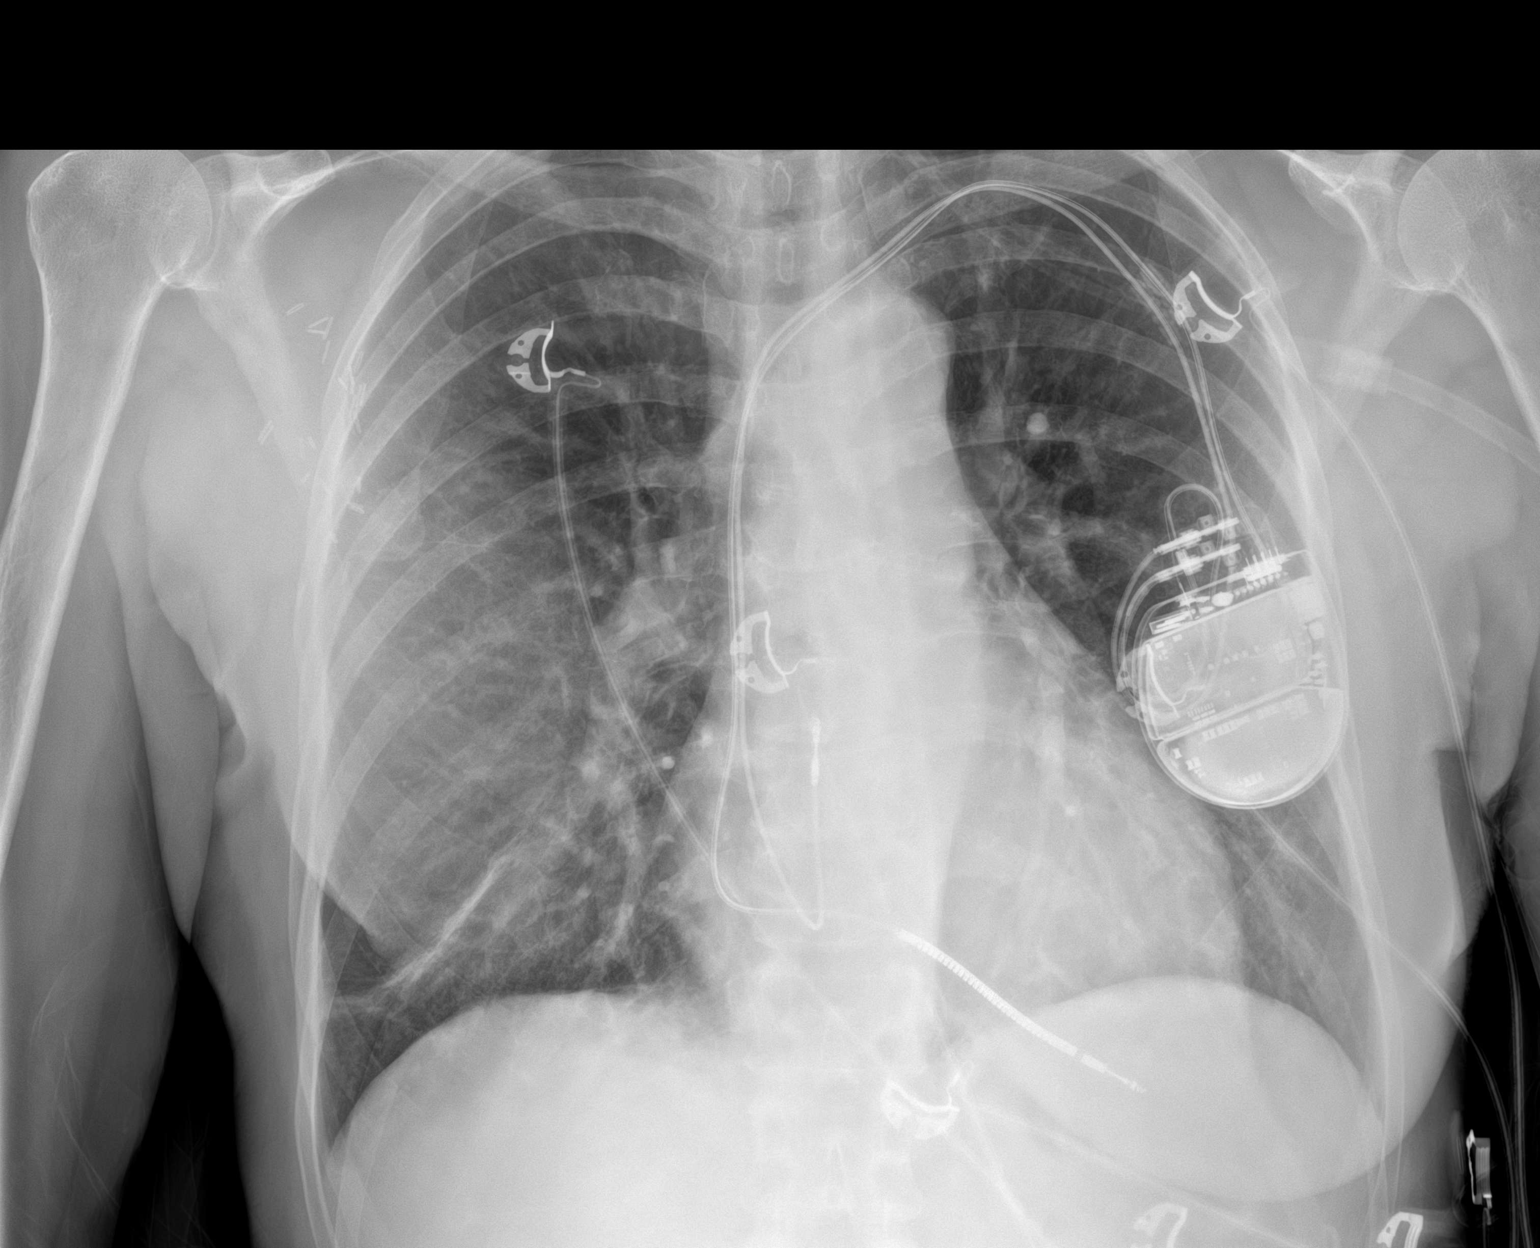

[1 of 1 positions shown; findings below may reference images not displayed]

FINDINGS: There is atelectatic change in the right base. Lungs elsewhere are
clear. Heart size and pulmonary vascularity are normal. No
adenopathy. Pacemaker leads are attached the right atrium and right
ventricle. No adenopathy. There are surgical clips in the right
axillary region.
IMPRESSION: Atelectatic change right base. Lungs elsewhere clear. Cardiac
silhouette within normal limits. Pacemaker leads attached to right
atrium and right ventricle. No evident adenopathy.

## 2021-05-07 ENCOUNTER — Ambulatory Visit (INDEPENDENT_AMBULATORY_CARE_PROVIDER_SITE_OTHER): Payer: Medicare Other | Admitting: *Deleted

## 2021-05-07 DIAGNOSIS — I6350 Cerebral infarction due to unspecified occlusion or stenosis of unspecified cerebral artery: Secondary | ICD-10-CM | POA: Diagnosis not present

## 2021-05-07 DIAGNOSIS — Z5181 Encounter for therapeutic drug level monitoring: Secondary | ICD-10-CM | POA: Diagnosis not present

## 2021-05-07 LAB — POCT INR: INR: 3.9 — AB (ref 2.0–3.0)

## 2021-05-07 NOTE — Patient Instructions (Signed)
Hold warfarin tonight then decrease dose to 1/2 tablet daily except 1 tablet on Mondays  ?Recheck in 3 wks ?

## 2021-05-09 DIAGNOSIS — C50919 Malignant neoplasm of unspecified site of unspecified female breast: Secondary | ICD-10-CM | POA: Diagnosis not present

## 2021-05-15 DIAGNOSIS — J329 Chronic sinusitis, unspecified: Secondary | ICD-10-CM | POA: Diagnosis not present

## 2021-05-16 DIAGNOSIS — J029 Acute pharyngitis, unspecified: Secondary | ICD-10-CM | POA: Diagnosis not present

## 2021-05-16 DIAGNOSIS — I10 Essential (primary) hypertension: Secondary | ICD-10-CM | POA: Diagnosis not present

## 2021-05-23 ENCOUNTER — Ambulatory Visit (INDEPENDENT_AMBULATORY_CARE_PROVIDER_SITE_OTHER): Payer: Medicare Other

## 2021-05-23 DIAGNOSIS — I420 Dilated cardiomyopathy: Secondary | ICD-10-CM

## 2021-05-23 LAB — CUP PACEART REMOTE DEVICE CHECK
Battery Remaining Longevity: 48 mo
Battery Remaining Percentage: 49 %
Battery Voltage: 2.92 V
Brady Statistic AP VP Percent: 1 %
Brady Statistic AP VS Percent: 18 %
Brady Statistic AS VP Percent: 1 %
Brady Statistic AS VS Percent: 78 %
Brady Statistic RA Percent Paced: 17 %
Brady Statistic RV Percent Paced: 1 %
Date Time Interrogation Session: 20230413040016
HighPow Impedance: 65 Ohm
HighPow Impedance: 65 Ohm
Implantable Lead Implant Date: 20100416
Implantable Lead Implant Date: 20100416
Implantable Lead Location: 753859
Implantable Lead Location: 753860
Implantable Lead Model: 7122
Implantable Pulse Generator Implant Date: 20170626
Lead Channel Impedance Value: 430 Ohm
Lead Channel Impedance Value: 460 Ohm
Lead Channel Pacing Threshold Amplitude: 0.75 V
Lead Channel Pacing Threshold Amplitude: 1 V
Lead Channel Pacing Threshold Pulse Width: 0.5 ms
Lead Channel Pacing Threshold Pulse Width: 0.5 ms
Lead Channel Sensing Intrinsic Amplitude: 1.5 mV
Lead Channel Sensing Intrinsic Amplitude: 11.7 mV
Lead Channel Setting Pacing Amplitude: 2 V
Lead Channel Setting Pacing Amplitude: 2.5 V
Lead Channel Setting Pacing Pulse Width: 0.5 ms
Lead Channel Setting Sensing Sensitivity: 0.5 mV
Pulse Gen Serial Number: 7370130

## 2021-05-28 ENCOUNTER — Ambulatory Visit (INDEPENDENT_AMBULATORY_CARE_PROVIDER_SITE_OTHER): Payer: Medicare Other | Admitting: *Deleted

## 2021-05-28 DIAGNOSIS — I6350 Cerebral infarction due to unspecified occlusion or stenosis of unspecified cerebral artery: Secondary | ICD-10-CM | POA: Diagnosis not present

## 2021-05-28 DIAGNOSIS — Z5181 Encounter for therapeutic drug level monitoring: Secondary | ICD-10-CM | POA: Diagnosis not present

## 2021-05-28 LAB — POCT INR: INR: 2.4 (ref 2.0–3.0)

## 2021-05-28 NOTE — Patient Instructions (Signed)
Continue 1/2 tablet daily except 1 tablet on Mondays  ?Recheck in 4 wks ?

## 2021-05-30 DIAGNOSIS — C50912 Malignant neoplasm of unspecified site of left female breast: Secondary | ICD-10-CM | POA: Diagnosis not present

## 2021-05-30 DIAGNOSIS — C50911 Malignant neoplasm of unspecified site of right female breast: Secondary | ICD-10-CM | POA: Diagnosis not present

## 2021-06-04 DIAGNOSIS — C50911 Malignant neoplasm of unspecified site of right female breast: Secondary | ICD-10-CM | POA: Diagnosis not present

## 2021-06-04 DIAGNOSIS — C50912 Malignant neoplasm of unspecified site of left female breast: Secondary | ICD-10-CM | POA: Diagnosis not present

## 2021-06-06 DIAGNOSIS — D1801 Hemangioma of skin and subcutaneous tissue: Secondary | ICD-10-CM | POA: Diagnosis not present

## 2021-06-06 DIAGNOSIS — L821 Other seborrheic keratosis: Secondary | ICD-10-CM | POA: Diagnosis not present

## 2021-06-06 DIAGNOSIS — L820 Inflamed seborrheic keratosis: Secondary | ICD-10-CM | POA: Diagnosis not present

## 2021-06-06 DIAGNOSIS — L814 Other melanin hyperpigmentation: Secondary | ICD-10-CM | POA: Diagnosis not present

## 2021-06-09 DIAGNOSIS — R7301 Impaired fasting glucose: Secondary | ICD-10-CM | POA: Diagnosis not present

## 2021-06-09 DIAGNOSIS — E876 Hypokalemia: Secondary | ICD-10-CM | POA: Diagnosis not present

## 2021-06-09 DIAGNOSIS — E7849 Other hyperlipidemia: Secondary | ICD-10-CM | POA: Diagnosis not present

## 2021-06-09 DIAGNOSIS — I5022 Chronic systolic (congestive) heart failure: Secondary | ICD-10-CM | POA: Diagnosis not present

## 2021-06-10 NOTE — Progress Notes (Signed)
Remote ICD transmission.   

## 2021-06-12 DIAGNOSIS — A6920 Lyme disease, unspecified: Secondary | ICD-10-CM | POA: Diagnosis not present

## 2021-06-13 DIAGNOSIS — C50912 Malignant neoplasm of unspecified site of left female breast: Secondary | ICD-10-CM | POA: Diagnosis not present

## 2021-06-25 ENCOUNTER — Ambulatory Visit (INDEPENDENT_AMBULATORY_CARE_PROVIDER_SITE_OTHER): Payer: Medicare Other | Admitting: *Deleted

## 2021-06-25 ENCOUNTER — Encounter: Payer: Medicare Other | Admitting: Internal Medicine

## 2021-06-25 DIAGNOSIS — I6350 Cerebral infarction due to unspecified occlusion or stenosis of unspecified cerebral artery: Secondary | ICD-10-CM | POA: Diagnosis not present

## 2021-06-25 DIAGNOSIS — Z5181 Encounter for therapeutic drug level monitoring: Secondary | ICD-10-CM

## 2021-06-25 LAB — POCT INR: INR: 2.3 (ref 2.0–3.0)

## 2021-06-25 NOTE — Patient Instructions (Signed)
On Doxycycline for Lyme's disease.  Has 2 days left. ?Continue 1/2 tablet daily except 1 tablet on Mondays  ?Recheck in 4 wks ?

## 2021-07-09 DIAGNOSIS — I4729 Other ventricular tachycardia: Secondary | ICD-10-CM | POA: Insufficient documentation

## 2021-07-09 HISTORY — DX: Other ventricular tachycardia: I47.29

## 2021-07-10 DIAGNOSIS — E7849 Other hyperlipidemia: Secondary | ICD-10-CM | POA: Diagnosis not present

## 2021-07-10 DIAGNOSIS — I5022 Chronic systolic (congestive) heart failure: Secondary | ICD-10-CM | POA: Diagnosis not present

## 2021-07-10 DIAGNOSIS — E876 Hypokalemia: Secondary | ICD-10-CM | POA: Diagnosis not present

## 2021-07-10 DIAGNOSIS — R7301 Impaired fasting glucose: Secondary | ICD-10-CM | POA: Diagnosis not present

## 2021-07-11 ENCOUNTER — Encounter: Payer: Self-pay | Admitting: Internal Medicine

## 2021-07-11 ENCOUNTER — Ambulatory Visit: Payer: Medicare Other | Admitting: Internal Medicine

## 2021-07-11 VITALS — BP 100/70 | HR 72 | Ht 68.0 in | Wt 164.0 lb

## 2021-07-11 DIAGNOSIS — I5022 Chronic systolic (congestive) heart failure: Secondary | ICD-10-CM | POA: Diagnosis not present

## 2021-07-11 DIAGNOSIS — Z95810 Presence of automatic (implantable) cardiac defibrillator: Secondary | ICD-10-CM

## 2021-07-11 DIAGNOSIS — I420 Dilated cardiomyopathy: Secondary | ICD-10-CM

## 2021-07-11 DIAGNOSIS — I4729 Other ventricular tachycardia: Secondary | ICD-10-CM

## 2021-07-11 LAB — CUP PACEART INCLINIC DEVICE CHECK
Battery Remaining Longevity: 50 mo
Brady Statistic RA Percent Paced: 12 %
Brady Statistic RV Percent Paced: 0.33 %
Date Time Interrogation Session: 20230601164607
HighPow Impedance: 75.375
Implantable Lead Implant Date: 20100416
Implantable Lead Implant Date: 20100416
Implantable Lead Location: 753859
Implantable Lead Location: 753860
Implantable Lead Model: 7122
Implantable Pulse Generator Implant Date: 20170626
Lead Channel Impedance Value: 425 Ohm
Lead Channel Impedance Value: 450 Ohm
Lead Channel Pacing Threshold Amplitude: 0.75 V
Lead Channel Pacing Threshold Amplitude: 0.75 V
Lead Channel Pacing Threshold Amplitude: 1.25 V
Lead Channel Pacing Threshold Amplitude: 1.25 V
Lead Channel Pacing Threshold Pulse Width: 0.5 ms
Lead Channel Pacing Threshold Pulse Width: 0.5 ms
Lead Channel Pacing Threshold Pulse Width: 0.5 ms
Lead Channel Pacing Threshold Pulse Width: 0.5 ms
Lead Channel Sensing Intrinsic Amplitude: 1.8 mV
Lead Channel Sensing Intrinsic Amplitude: 12 mV
Lead Channel Setting Pacing Amplitude: 2 V
Lead Channel Setting Pacing Amplitude: 2.5 V
Lead Channel Setting Pacing Pulse Width: 0.5 ms
Lead Channel Setting Sensing Sensitivity: 0.5 mV
Pulse Gen Serial Number: 7370130

## 2021-07-11 MED ORDER — EMPAGLIFLOZIN 10 MG PO TABS: 10.0000 mg | ORAL_TABLET | Freq: Every day | ORAL | 3 refills | Status: DC

## 2021-07-11 MED ORDER — FUROSEMIDE 40 MG PO TABS: 80.0000 mg | ORAL_TABLET | ORAL | 3 refills | Status: DC

## 2021-07-11 NOTE — Patient Instructions (Signed)
Medication Instructions:  Your physician has recommended you make the following change in your medication:   ** Begin Jardiance '10mg'$  - 1 tablet by mouth daily prior to breakfast  ** Furosemide '40mg'$  - 2 tablets ('80mg'$ ) by mouth every other day.   *If you need a refill on your cardiac medications before your next appointment, please call your pharmacy*   Lab Work: None ordered.  If you have labs (blood work) drawn today and your tests are completely normal, you will receive your results only by: [place] (if you have MyChart) OR A paper copy in the mail If you have any lab test that is abnormal or we need to change your treatment, we will call you to review the results.   Testing/Procedures: None ordered.    Follow-Up: At [facility], you and your health needs are our priority.  As part of our continuing mission to provide you with exceptional heart care, we have created designated Provider Care Teams.  These Care Teams include your primary Cardiologist (physician) and Advanced Practice Providers (APPs -  Physician Assistants and Nurse Practitioners) who all work together to provide you with the care you need, when you need it.  We recommend signing up for the patient portal called "MyChart".  Sign up information is provided on this After Visit Summary.  MyChart is used to connect with patients for Virtual Visits (Telemedicine).  Patients are able to view lab/test results, encounter notes, upcoming appointments, etc.  Non-urgent messages can be sent to your provider as well.   To learn more about what you can do with MyChart, go to [email or web address].    Your next appointment:   12 months with Dr Caryl Comes  Important Information About Sugar

## 2021-07-11 NOTE — Progress Notes (Signed)
Patient Care Team: Caryl Bis, MD as PCP - General (Family Medicine) Minus Breeding, MD as PCP - Cardiology (Cardiology)   HPI  [name] [name] is a 71 y.o. female Seen in followup for ICD [employer]  implanted for primary prevention in the setting of nonischemic heart disease + for LAMIN gene mutation.  June 2017 underwent device generator replacement.  COVID January 2021 and then vaccinated with rxn ( #1&2)     She is having more problems with dyspnea.  Peripheral edema.  No nocturnal dyspnea.  Some lightheadedness particularly with bending.  Had contralateral breast cancer 30 years or so after her first mastectomy.  Underwent mastectomy 2/23.  Pretty painful.   Date Cr K Dig Hgb  1/21 0.83 3.9     10/21 1.01 4.4 0.6 12.  2/23 0.82 4.1 0.4 10.9    DATE TEST EF   12/13 Echo   35-40 %   2/21 Echo   40-45 %   2/23 Echo  35-40%     Past Medical History:  Diagnosis Date   AICD (automatic cardioverter/defibrillator) present    Arthritis    thumbs   Breast cancer ([place])    right   Cerebrovascular disease    s/p right MCA cardioemoblic infarct 1/49/70   CHF (congestive heart failure) (H[facility])    Nonischemic cardiomyopathy ([place])    a. +LMNA mutation b. s/p STJ dual chamber ICD   Presence of permanent cardiac pacemaker    Stroke ([place]) 02/2008   no deficits    Past Surgical History:  Procedure Laterality Date   ABDOMINAL HYSTERECTOMY     BREAST IMPLANT REMOVAL Right 05/31/2015   Procedure: REMOVAL RIGHT  BREAST IMPLANTS;  Surgeon: Crissie Reese, MD;  Location: [place];  Service: Plastics;  Laterality: Right;   BREAST RECONSTRUCTION Right 1995   BREAST RECONSTRUCTION Right 05/31/2015   Procedure: RIGHT BREAST RECONSTRUCTION WITH SALINE IMPLANTS ;  Surgeon: Crissie Reese, MD;  Location: [facility];  Service: Plastics;  Laterality: Right;   CARDIAC DEFIBRILLATOR PLACEMENT  2010   STJ dual chamber ICD implanted for NICM by Dr Caryl Comes   CARDIAC DEFIBRILLATOR PLACEMENT   05/26/2008   CATARACT EXTRACTION W/PHACO Right 03/19/2018   Procedure: CATARACT EXTRACTION PHACO AND INTRAOCULAR LENS PLACEMENT (IOC);  Surgeon: Baruch Goldmann, MD;  Location: [facility];  Service: Ophthalmology;  Laterality: Right;  CDE: 5.42   CATARACT EXTRACTION W/PHACO Left 10/01/2018   Procedure: CATARACT EXTRACTION PHACO AND INTRAOCULAR LENS PLACEMENT (IOC);  Surgeon: Baruch Goldmann, MD;  Location: [facility];  Service: Ophthalmology;  Laterality: Left;  left, CDE: 6.06   CHOLECYSTECTOMY     COLONOSCOPY     EP IMPLANTABLE DEVICE N/A 08/06/2015   Procedure: ICD Generator Changeout;  Surgeon: Deboraha Sprang, MD;  Location: [facility];  Service: Cardiovascular;  Laterality: N/A;   ESOPHAGOGASTRODUODENOSCOPY     MASTECTOMY Right    MASTECTOMY W/ SENTINEL NODE BIOPSY Left 04/05/2021   Procedure: LEFT MASTECTOMY;  Surgeon: Coralie Keens, MD;  Location: [place];  Service: General;  Laterality: Left;   MASTOPEXY Left 05/31/2015   Procedure: LEFT BREAST MASTOPEXY;  Surgeon: Crissie Reese, MD;  Location: [facility];  Service: Plastics;  Laterality: Left;   PACEMAKER INSERTION  05/2008   SENTINEL NODE BIOPSY Left 04/05/2021   Procedure: SENTINEL LYMPH NODE BIOPSY;  Surgeon: Coralie Keens, MD;  Location: [facility] [place];  Service: General;  Laterality: Left;   TONSILLECTOMY     VARICOSE VEIN SURGERY Left  leg    Current Outpatient Medications  Medication Sig Dispense Refill   ascorbic acid (VITAMIN C) 500 MG tablet Take 1 tablet (500 mg total) by mouth daily. 30 tablet 2   atorvastatin (LIPITOR) 20 MG tablet Take 1 tablet (20 mg total) by mouth daily. 30 tablet 2   BIOTIN PO Take 1 tablet by mouth daily.     digoxin (LANOXIN) 0.125 MG tablet TAKE ONE TABLET BY MOUTH DAILY. 90 tablet 2   furosemide (LASIX) 40 MG tablet Take 0.5 tablets (20 mg total) by mouth as needed for fluid or edema (q24 hrs as needed). (Patient taking differently: Take 40 mg by mouth daily as needed for edema.) 90 tablet 3   metoprolol  succinate (TOPROL-XL) 50 MG 24 hr tablet TAKE ONE TABLET BY MOUTH TWICE DAILY 60 tablet 6   Multiple Vitamin (MULTIVITAMIN WITH MINERALS) TABS tablet Take 1 tablet by mouth daily.     Omega-3 Fatty Acids (FISH OIL PO) Take 1 capsule by mouth daily.      sacubitril-valsartan (ENTRESTO) 97-103 MG Take 1 tablet by mouth 2 (two) times daily. 180 tablet 3   spironolactone (ALDACTONE) 25 MG tablet TAKE 1/2 TABLET BY MOUTH DAILY. 45 tablet 2   warfarin (COUMADIN) 5 MG tablet TAKE 1/2 TO 1 TABLET BY MOUTH DAILY AS DIRECTED 70 tablet 0   zinc gluconate 50 MG tablet Take 50 mg by mouth daily.     No current facility-administered medications for this visit.    No Known Allergies  Review of Systems negative except from HPI and PMH  Physical Exam BP 100/70 (BP Location: Left Arm, Patient Position: Sitting, Cuff Size: Normal)   Pulse 72   Ht '5\' 8"'$  (1.727 m)   Wt 164 lb (74.4 kg)   BMI 24.94 kg/m  Well developed and well nourished in no acute distress HENT normal Neck supple with JVP-7-8 Clear Device pocket well healed; without hematoma or erythema.  There is no tethering  Regular rate and rhythm, no murmur Abd-soft with active BS No Clubbing cyanosis 1+ edema Skin-warm and dry A & Oriented  Grossly normal sensory and motor function  ECG sinus at 72 Intervals 20/14/42 Left bundle branch block      Assessment and  Plan  DCM Lamin  Left bundle branch block  ICD    Congestive heart failure-chronic-systolic-class IIb-IIIa  Fam hx of + gene mutation Lamin Cardiomyopathy  High risk medication surveillance  VTNS  Orthostatic lightheadedness   No recurrent ventricular tachycardia.  We will continue the metoprolol 50 twice daily.  Lightheadedness precludes further up titration of her medications so we will continue her on the Entresto 97/103 Aldactone 12.5 and the Toprol as noted.  Some volume overload.  She finds that she is responding better to 60 of furosemide and 40; we will  change her to 80 every other day.  With her left ventricular dysfunction and dyspnea, we will begin her on an SGLT2, Jardiance 10 mg daily.  She has also developed left bundle branch block.  She has lost the Q waves in lead aVL and the QRS continues to widen.  She now is fractionation in all 3 leads, 1, L, V6.  It would be appropriate to consider CRT upgrade if symptoms persist.

## 2021-07-12 DIAGNOSIS — A6920 Lyme disease, unspecified: Secondary | ICD-10-CM | POA: Diagnosis not present

## 2021-07-12 DIAGNOSIS — J0100 Acute maxillary sinusitis, unspecified: Secondary | ICD-10-CM | POA: Diagnosis not present

## 2021-07-23 ENCOUNTER — Other Ambulatory Visit: Payer: Self-pay

## 2021-07-23 ENCOUNTER — Other Ambulatory Visit: Payer: Self-pay | Admitting: Cardiology

## 2021-07-23 ENCOUNTER — Ambulatory Visit (INDEPENDENT_AMBULATORY_CARE_PROVIDER_SITE_OTHER): Payer: Medicare Other | Admitting: *Deleted

## 2021-07-23 DIAGNOSIS — I6350 Cerebral infarction due to unspecified occlusion or stenosis of unspecified cerebral artery: Secondary | ICD-10-CM | POA: Diagnosis not present

## 2021-07-23 DIAGNOSIS — Z5181 Encounter for therapeutic drug level monitoring: Secondary | ICD-10-CM | POA: Diagnosis not present

## 2021-07-23 DIAGNOSIS — I5022 Chronic systolic (congestive) heart failure: Secondary | ICD-10-CM

## 2021-07-23 LAB — POCT INR: INR: 1.4 — AB (ref 2.0–3.0)

## 2021-07-23 MED ORDER — METOPROLOL SUCCINATE ER 50 MG PO TB24: 50.0000 mg | ORAL_TABLET | Freq: Two times a day (BID) | ORAL | 11 refills | Status: DC

## 2021-07-23 NOTE — Patient Instructions (Signed)
Take warfarin 1 tablet tonight and tomorrow night then resume 1/2 tablet daily except 1 tablet on Mondays Recheck in 2 wks 

## 2021-08-08 ENCOUNTER — Ambulatory Visit (INDEPENDENT_AMBULATORY_CARE_PROVIDER_SITE_OTHER): Payer: Medicare Other | Admitting: *Deleted

## 2021-08-08 DIAGNOSIS — I6350 Cerebral infarction due to unspecified occlusion or stenosis of unspecified cerebral artery: Secondary | ICD-10-CM

## 2021-08-08 DIAGNOSIS — Z5181 Encounter for therapeutic drug level monitoring: Secondary | ICD-10-CM

## 2021-08-08 LAB — POCT INR: INR: 2.3 (ref 2.0–3.0)

## 2021-08-08 NOTE — Patient Instructions (Signed)
Continue warfarin 1/2 tablet daily except 1 tablet on Mondays Recheck in 4 wks 

## 2021-08-09 ENCOUNTER — Ambulatory Visit: Payer: Medicare Other | Admitting: Cardiology

## 2021-08-22 ENCOUNTER — Ambulatory Visit (INDEPENDENT_AMBULATORY_CARE_PROVIDER_SITE_OTHER): Payer: Medicare Other

## 2021-08-22 DIAGNOSIS — Z95810 Presence of automatic (implantable) cardiac defibrillator: Secondary | ICD-10-CM

## 2021-08-23 LAB — CUP PACEART REMOTE DEVICE CHECK
Battery Remaining Longevity: 45 mo
Battery Remaining Percentage: 46 %
Battery Voltage: 2.92 V
Brady Statistic AP VP Percent: 1 %
Brady Statistic AP VS Percent: 11 %
Brady Statistic AS VP Percent: 1 %
Brady Statistic AS VS Percent: 86 %
Brady Statistic RA Percent Paced: 9.4 %
Brady Statistic RV Percent Paced: 1 %
Date Time Interrogation Session: 20230714124803
HighPow Impedance: 69 Ohm
HighPow Impedance: 69 Ohm
Implantable Lead Implant Date: 20100416
Implantable Lead Implant Date: 20100416
Implantable Lead Location: 753859
Implantable Lead Location: 753860
Implantable Lead Model: 7122
Implantable Pulse Generator Implant Date: 20170626
Lead Channel Impedance Value: 440 Ohm
Lead Channel Impedance Value: 440 Ohm
Lead Channel Pacing Threshold Amplitude: 0.75 V
Lead Channel Pacing Threshold Amplitude: 1.25 V
Lead Channel Pacing Threshold Pulse Width: 0.5 ms
Lead Channel Pacing Threshold Pulse Width: 0.5 ms
Lead Channel Sensing Intrinsic Amplitude: 1.4 mV
Lead Channel Sensing Intrinsic Amplitude: 12 mV
Lead Channel Setting Pacing Amplitude: 2 V
Lead Channel Setting Pacing Amplitude: 2.5 V
Lead Channel Setting Pacing Pulse Width: 0.5 ms
Lead Channel Setting Sensing Sensitivity: 0.5 mV
Pulse Gen Serial Number: 7370130

## 2021-09-05 ENCOUNTER — Ambulatory Visit (INDEPENDENT_AMBULATORY_CARE_PROVIDER_SITE_OTHER): Payer: Medicare Other | Admitting: *Deleted

## 2021-09-05 DIAGNOSIS — Z5181 Encounter for therapeutic drug level monitoring: Secondary | ICD-10-CM

## 2021-09-05 DIAGNOSIS — I6350 Cerebral infarction due to unspecified occlusion or stenosis of unspecified cerebral artery: Secondary | ICD-10-CM

## 2021-09-05 LAB — POCT INR: INR: 1.8 — AB (ref 2.0–3.0)

## 2021-09-05 NOTE — Patient Instructions (Signed)
Description   Take 1 tablet of warfarin today and then continue warfarin 1/2 tablet daily except 1 tablet on Mondays  Recheck in 3 wks

## 2021-09-06 NOTE — Progress Notes (Signed)
Remote ICD transmission.   

## 2021-09-09 DIAGNOSIS — R7301 Impaired fasting glucose: Secondary | ICD-10-CM | POA: Diagnosis not present

## 2021-09-09 DIAGNOSIS — E7849 Other hyperlipidemia: Secondary | ICD-10-CM | POA: Diagnosis not present

## 2021-09-09 DIAGNOSIS — I5022 Chronic systolic (congestive) heart failure: Secondary | ICD-10-CM | POA: Diagnosis not present

## 2021-09-09 DIAGNOSIS — E876 Hypokalemia: Secondary | ICD-10-CM | POA: Diagnosis not present

## 2021-09-15 NOTE — Progress Notes (Signed)
Cardiology Office Note   Date:  09/17/2021   ID:  [name] [name], [name] [name] [birth year], MRN [id number]  PCP:  Caryl Bis, MD  Cardiologist:   Minus Breeding, MD    Chief Complaint  Patient presents with   Shortness of Breath      History of Present Illness: [name] [name] is a 71 y.o. female who presents for follow up of cardiomyopathy.  She has a history of a nonischemic cardiomyopathy. Her ejection fraction was at one point in time 15 - 20%. A later ejection fraction was about 40% in 2015.   Most recently the EF was 25% again.   I sent her for a CPX in 2018 which demonstrated a good VO2 Max.    Since I last saw her she saw Dr. Caryl Comes and she reported increased DOE.  I reviewed his note for this visit.  He suggested that they could consider CRT upgrade.    She said that since she saw Dr. Caryl Comes she is actually been breathing better.  She does her activities walking outside and she is going to get a treadmill. The patient denies any new symptoms such as chest discomfort, neck or arm discomfort. There has been no new shortness of breath, PND or orthopnea. There have been no reported palpitations, presyncope or syncope.    Past Medical History:  Diagnosis Date   AICD (automatic cardioverter/defibrillator) present    Arthritis    thumbs   Breast cancer ([place])    right   Cerebrovascular disease    s/p right MCA cardioemoblic infarct 2/95/28   CHF (congestive heart failure) (HCC)    Nonischemic cardiomyopathy ([place])    a. +LMNA mutation b. s/p STJ dual chamber ICD   Presence of permanent cardiac pacemaker    Stroke ([place]) 02/2008   no deficits    Past Surgical History:  Procedure Laterality Date   ABDOMINAL HYSTERECTOMY     BREAST IMPLANT REMOVAL Right 05/31/2015   Procedure: REMOVAL RIGHT  BREAST IMPLANTS;  Surgeon: Crissie Reese, MD;  Location: [facility];  Service: Plastics;  Laterality: Right;   BREAST RECONSTRUCTION Right 1995   BREAST RECONSTRUCTION Right 05/31/2015    Procedure: RIGHT BREAST RECONSTRUCTION WITH SALINE IMPLANTS ;  Surgeon: Crissie Reese, MD;  Location: [facility];  Service: Plastics;  Laterality: Right;   CARDIAC DEFIBRILLATOR PLACEMENT  2010   STJ dual chamber ICD implanted for NICM by Dr Caryl Comes   CARDIAC DEFIBRILLATOR PLACEMENT  05/26/2008   CATARACT EXTRACTION W/PHACO Right 03/19/2018   Procedure: CATARACT EXTRACTION PHACO AND INTRAOCULAR LENS PLACEMENT (IOC);  Surgeon: Baruch Goldmann, MD;  Location: [facility];  Service: Ophthalmology;  Laterality: Right;  CDE: 5.42   CATARACT EXTRACTION W/PHACO Left 10/01/2018   Procedure: CATARACT EXTRACTION PHACO AND INTRAOCULAR LENS PLACEMENT (IOC);  Surgeon: Baruch Goldmann, MD;  Location: [facility];  Service: Ophthalmology;  Laterality: Left;  left, CDE: 6.06   CHOLECYSTECTOMY     COLONOSCOPY     EP IMPLANTABLE DEVICE N/A 08/06/2015   Procedure: ICD Generator Changeout;  Surgeon: Deboraha Sprang, MD;  Location: [facility];  Service: Cardiovascular;  Laterality: N/A;   ESOPHAGOGASTRODUODENOSCOPY     MASTECTOMY Right    MASTECTOMY W/ SENTINEL NODE BIOPSY Left 04/05/2021   Procedure: LEFT MASTECTOMY;  Surgeon: Coralie Keens, MD;  Location: [facility];  Service: General;  Laterality: Left;   MASTOPEXY Left 05/31/2015   Procedure: LEFT BREAST MASTOPEXY;  Surgeon: Crissie Reese, MD;  Location: [facility];  Service:  Plastics;  Laterality: Left;   PACEMAKER INSERTION  05/2008   SENTINEL NODE BIOPSY Left 04/05/2021   Procedure: SENTINEL LYMPH NODE BIOPSY;  Surgeon: Coralie Keens, MD;  Location: [facility];  Service: General;  Laterality: Left;   TONSILLECTOMY     VARICOSE VEIN SURGERY Left    leg     Current Outpatient Medications  Medication Sig Dispense Refill   ascorbic acid (VITAMIN C) 500 MG tablet Take 1 tablet (500 mg total) by mouth daily. 30 tablet 2   atorvastatin (LIPITOR) 20 MG tablet Take 1 tablet (20 mg total) by mouth daily. 30 tablet 2   BIOTIN PO Take 1 tablet by mouth daily.     digoxin (LANOXIN) 0.125  MG tablet TAKE ONE TABLET BY MOUTH DAILY. 90 tablet 2   empagliflozin (JARDIANCE) 10 MG TABS tablet Take 1 tablet (10 mg total) by mouth daily before breakfast. 90 tablet 3   furosemide (LASIX) 40 MG tablet Take 2 tablets (80 mg total) by mouth every other day. 90 tablet 3   metoprolol succinate (TOPROL-XL) 50 MG 24 hr tablet Take 1 tablet (50 mg total) by mouth 2 (two) times daily. Take with or immediately following a meal. 60 tablet 11   Multiple Vitamin (MULTIVITAMIN WITH MINERALS) TABS tablet Take 1 tablet by mouth daily.     Omega-3 Fatty Acids (FISH OIL PO) Take 1 capsule by mouth daily.      sacubitril-valsartan (ENTRESTO) 97-103 MG Take 1 tablet by mouth 2 (two) times daily. 180 tablet 3   spironolactone (ALDACTONE) 25 MG tablet TAKE 1/2 TABLET BY MOUTH DAILY. 45 tablet 2   warfarin (COUMADIN) 5 MG tablet TAKE 1/2 TO 1 TABLET BY MOUTH DAILY AS DIRECTED 70 tablet 0   zinc gluconate 50 MG tablet Take 50 mg by mouth daily.     letrozole (FEMARA) 2.5 MG tablet Take 2.5 mg by mouth daily.     No current facility-administered medications for this visit.    Allergies:   Patient has no known allergies.   ROS:  Please see the history of present illness.   Otherwise, review of systems are positive for none.   All other systems are reviewed and negative.    PHYSICAL EXAM: VS:  BP (!) 110/54   Pulse 67   Ht '5\' 8"'$  (1.727 m)   Wt 166 lb 6.4 oz (75.5 kg)   SpO2 97%   BMI 25.30 kg/m  , BMI Body mass index is 25.3 kg/m.  GENERAL:  Well appearing NECK:  No jugular venous distention, waveform within normal limits, carotid upstroke brisk and symmetric, no bruits, no thyromegaly LUNGS:  Clear to auscultation bilaterally CHEST:  Well healed ICD pocket.   HEART:  PMI not displaced or sustained,S1 and S2 within normal limits, no S3, no S4, no clicks, no rubs, no murmurs ABD:  Flat, positive bowel sounds normal in frequency in pitch, no bruits, no rebound, no guarding, no midline pulsatile mass, no  hepatomegaly, no splenomegaly EXT:  2 plus pulses throughout, no edema, no cyanosis no clubbing   EKG:  EKG is  not ordered today.  Recent Labs: 04/05/2021: Hemoglobin 10.9; Platelets 120 04/06/2021: BUN 20; Creatinine, Ser 0.82; Potassium 4.1; Sodium 138    Lipid Panel    Component Value Date/Time   CHOL  03/10/2008 0500    128        ATP III CLASSIFICATION:  <200     mg/dL   Desirable  200-239  mg/dL   Borderline  High  >=240    mg/dL   High          TRIG 167 (H) 02/19/2019 1244   HDL 33 (L) 03/10/2008 0500   CHOLHDL 3.9 03/10/2008 0500   VLDL 13 03/10/2008 0500   LDLCALC  03/10/2008 0500    82        Total Cholesterol/HDL:CHD Risk Coronary Heart Disease Risk Table                     Men   Women  1/2 Average Risk   3.4   3.3  Average Risk       5.0   4.4  2 X Average Risk   9.6   7.1  3 X Average Risk  23.4   11.0        Use the calculated Patient Ratio above and the CHD Risk Table to determine the patient's CHD Risk.        ATP III CLASSIFICATION (LDL):  <100     mg/dL   Optimal  100-129  mg/dL   Near or Above                    Optimal  130-159  mg/dL   Borderline  160-189  mg/dL   High  >190     mg/dL   Very High      Wt Readings from Last 3 Encounters:  09/17/21 166 lb 6.4 oz (75.5 kg)  07/11/21 164 lb (74.4 kg)  04/05/21 166 lb (75.3 kg)      Other studies Reviewed: Additional studies/ records that were reviewed today include: Labs Review of the above records demonstrates:  Please see elsewhere in the note.     ASSESSMENT AND PLAN:  CARDIOMYOPATHY, PRIMARY, DILATED - She is not having any further symptoms.  She does not want to consider any upgrade to her device and right now I think she is euvolemic.   ICD -  She is up-to-date with follow-up and saw Dr. Caryl Comes .  I did review the most recent tracing.   CVA -  She has no residual from this.  No change in therapy.   Current medicines are reviewed at length with the patient today.  The patient  does not have concerns regarding medicines.  The following changes have been made: None  Labs/ tests ordered today include:  None  No orders of the defined types were placed in this encounter.   Disposition:   FU with me in 6 months.    Signed, Minus Breeding, MD  09/17/2021 2:37 PM    [place]

## 2021-09-17 ENCOUNTER — Encounter: Payer: Self-pay | Admitting: Cardiology

## 2021-09-17 ENCOUNTER — Ambulatory Visit: Payer: Medicare Other | Admitting: Cardiology

## 2021-09-17 VITALS — BP 110/54 | HR 67 | Ht 68.0 in | Wt 166.4 lb

## 2021-09-17 DIAGNOSIS — R0602 Shortness of breath: Secondary | ICD-10-CM

## 2021-09-17 DIAGNOSIS — Z95810 Presence of automatic (implantable) cardiac defibrillator: Secondary | ICD-10-CM

## 2021-09-17 DIAGNOSIS — I420 Dilated cardiomyopathy: Secondary | ICD-10-CM | POA: Diagnosis not present

## 2021-09-17 NOTE — Patient Instructions (Signed)
°  Follow-Up: °At [facility], you and your health needs are our priority.  As part of our continuing mission to provide you with exceptional heart care, we have created designated Provider Care Teams.  These Care Teams include your primary Cardiologist (physician) and Advanced Practice Providers (APPs -  Physician Assistants and Nurse Practitioners) who all work together to provide you with the care you need, when you need it. ° °We recommend signing up for the patient portal called "MyChart".  Sign up information is provided on this After Visit Summary.  MyChart is used to connect with patients for Virtual Visits (Telemedicine).  Patients are able to view lab/test results, encounter notes, upcoming appointments, etc.  Non-urgent messages can be sent to your provider as well.   °To learn more about what you can do with MyChart, go to [email or web address].   ° °Your next appointment:   °6 month(s) ° °The format for your next appointment:   °In Person ° °Provider:   °James Hochrein, MD   ° ° °

## 2021-09-23 ENCOUNTER — Other Ambulatory Visit: Payer: Self-pay | Admitting: Cardiology

## 2021-09-23 DIAGNOSIS — Z5181 Encounter for therapeutic drug level monitoring: Secondary | ICD-10-CM

## 2021-09-23 NOTE — Telephone Encounter (Signed)
Prescription refill request received for warfarin Lov: 09/17/21 (Hochrein) Next INR check: 09/24/21 Warfarin tablet strength: '5mg'$   Appropriate dose and refill sent to requested pharmacy.

## 2021-09-24 ENCOUNTER — Ambulatory Visit (INDEPENDENT_AMBULATORY_CARE_PROVIDER_SITE_OTHER): Payer: Medicare Other | Admitting: *Deleted

## 2021-09-24 DIAGNOSIS — Z5181 Encounter for therapeutic drug level monitoring: Secondary | ICD-10-CM

## 2021-09-24 DIAGNOSIS — Z8673 Personal history of transient ischemic attack (TIA), and cerebral infarction without residual deficits: Secondary | ICD-10-CM

## 2021-09-24 DIAGNOSIS — I6350 Cerebral infarction due to unspecified occlusion or stenosis of unspecified cerebral artery: Secondary | ICD-10-CM

## 2021-09-24 LAB — POCT INR: INR: 2.6 (ref 2.0–3.0)

## 2021-09-24 NOTE — Patient Instructions (Signed)
Continue warfarin 1/2 tablet daily except 1 tablet on Mondays Recheck in 3 wks 

## 2021-10-09 DIAGNOSIS — R5383 Other fatigue: Secondary | ICD-10-CM | POA: Diagnosis not present

## 2021-10-09 DIAGNOSIS — E1122 Type 2 diabetes mellitus with diabetic chronic kidney disease: Secondary | ICD-10-CM | POA: Diagnosis not present

## 2021-10-09 DIAGNOSIS — E7849 Other hyperlipidemia: Secondary | ICD-10-CM | POA: Diagnosis not present

## 2021-10-09 DIAGNOSIS — E876 Hypokalemia: Secondary | ICD-10-CM | POA: Diagnosis not present

## 2021-10-09 DIAGNOSIS — I10 Essential (primary) hypertension: Secondary | ICD-10-CM | POA: Diagnosis not present

## 2021-10-09 DIAGNOSIS — I739 Peripheral vascular disease, unspecified: Secondary | ICD-10-CM | POA: Diagnosis not present

## 2021-10-09 DIAGNOSIS — Z0001 Encounter for general adult medical examination with abnormal findings: Secondary | ICD-10-CM | POA: Diagnosis not present

## 2021-10-09 DIAGNOSIS — E039 Hypothyroidism, unspecified: Secondary | ICD-10-CM | POA: Diagnosis not present

## 2021-10-09 DIAGNOSIS — D649 Anemia, unspecified: Secondary | ICD-10-CM | POA: Diagnosis not present

## 2021-10-16 ENCOUNTER — Ambulatory Visit: Payer: Medicare Other | Attending: Cardiology | Admitting: *Deleted

## 2021-10-16 DIAGNOSIS — Z5181 Encounter for therapeutic drug level monitoring: Secondary | ICD-10-CM | POA: Diagnosis not present

## 2021-10-16 DIAGNOSIS — Z8673 Personal history of transient ischemic attack (TIA), and cerebral infarction without residual deficits: Secondary | ICD-10-CM

## 2021-10-16 DIAGNOSIS — I6350 Cerebral infarction due to unspecified occlusion or stenosis of unspecified cerebral artery: Secondary | ICD-10-CM

## 2021-10-16 LAB — POCT INR: INR: 3.1 — AB (ref 2.0–3.0)

## 2021-10-16 NOTE — Patient Instructions (Signed)
Continue warfarin 1/2 tablet daily except 1 tablet on Mondays Recheck in 4 wks 

## 2021-10-17 DIAGNOSIS — I5022 Chronic systolic (congestive) heart failure: Secondary | ICD-10-CM | POA: Diagnosis not present

## 2021-10-17 DIAGNOSIS — Z0001 Encounter for general adult medical examination with abnormal findings: Secondary | ICD-10-CM | POA: Diagnosis not present

## 2021-10-17 DIAGNOSIS — E1165 Type 2 diabetes mellitus with hyperglycemia: Secondary | ICD-10-CM | POA: Diagnosis not present

## 2021-10-17 DIAGNOSIS — Z23 Encounter for immunization: Secondary | ICD-10-CM | POA: Diagnosis not present

## 2021-10-17 DIAGNOSIS — E876 Hypokalemia: Secondary | ICD-10-CM | POA: Diagnosis not present

## 2021-10-17 DIAGNOSIS — D6869 Other thrombophilia: Secondary | ICD-10-CM | POA: Diagnosis not present

## 2021-10-17 DIAGNOSIS — I420 Dilated cardiomyopathy: Secondary | ICD-10-CM | POA: Diagnosis not present

## 2021-10-17 DIAGNOSIS — E7849 Other hyperlipidemia: Secondary | ICD-10-CM | POA: Diagnosis not present

## 2021-10-25 DIAGNOSIS — R059 Cough, unspecified: Secondary | ICD-10-CM | POA: Diagnosis not present

## 2021-10-25 DIAGNOSIS — J329 Chronic sinusitis, unspecified: Secondary | ICD-10-CM | POA: Diagnosis not present

## 2021-11-05 DIAGNOSIS — E559 Vitamin D deficiency, unspecified: Secondary | ICD-10-CM | POA: Diagnosis not present

## 2021-11-05 DIAGNOSIS — C50919 Malignant neoplasm of unspecified site of unspecified female breast: Secondary | ICD-10-CM | POA: Diagnosis not present

## 2021-11-12 DIAGNOSIS — M8580 Other specified disorders of bone density and structure, unspecified site: Secondary | ICD-10-CM | POA: Diagnosis not present

## 2021-11-12 DIAGNOSIS — M810 Age-related osteoporosis without current pathological fracture: Secondary | ICD-10-CM | POA: Diagnosis not present

## 2021-11-12 DIAGNOSIS — C50919 Malignant neoplasm of unspecified site of unspecified female breast: Secondary | ICD-10-CM | POA: Diagnosis not present

## 2021-11-13 ENCOUNTER — Ambulatory Visit: Payer: Medicare Other | Attending: Cardiology | Admitting: *Deleted

## 2021-11-13 DIAGNOSIS — I6350 Cerebral infarction due to unspecified occlusion or stenosis of unspecified cerebral artery: Secondary | ICD-10-CM

## 2021-11-13 DIAGNOSIS — Z5181 Encounter for therapeutic drug level monitoring: Secondary | ICD-10-CM | POA: Diagnosis not present

## 2021-11-13 LAB — POCT INR: INR: 3.3 — AB (ref 2.0–3.0)

## 2021-11-13 NOTE — Patient Instructions (Signed)
Took doxycyline 2 x day x 10 days  Hold warfarin tonight then resume 1/2 tablet daily except 1 tablet on Mondays  Recheck in 4 wks

## 2021-11-21 ENCOUNTER — Ambulatory Visit (INDEPENDENT_AMBULATORY_CARE_PROVIDER_SITE_OTHER): Payer: Medicare Other

## 2021-11-21 DIAGNOSIS — M9903 Segmental and somatic dysfunction of lumbar region: Secondary | ICD-10-CM | POA: Diagnosis not present

## 2021-11-21 DIAGNOSIS — M9902 Segmental and somatic dysfunction of thoracic region: Secondary | ICD-10-CM | POA: Diagnosis not present

## 2021-11-21 DIAGNOSIS — I420 Dilated cardiomyopathy: Secondary | ICD-10-CM

## 2021-11-21 DIAGNOSIS — S335XXA Sprain of ligaments of lumbar spine, initial encounter: Secondary | ICD-10-CM | POA: Diagnosis not present

## 2021-11-21 DIAGNOSIS — S233XXA Sprain of ligaments of thoracic spine, initial encounter: Secondary | ICD-10-CM | POA: Diagnosis not present

## 2021-11-21 LAB — CUP PACEART REMOTE DEVICE CHECK
Battery Remaining Longevity: 43 mo
Battery Remaining Percentage: 45 %
Battery Voltage: 2.9 V
Brady Statistic AP VP Percent: 1 %
Brady Statistic AP VS Percent: 12 %
Brady Statistic AS VP Percent: 1 %
Brady Statistic AS VS Percent: 85 %
Brady Statistic RA Percent Paced: 10 %
Brady Statistic RV Percent Paced: 1 %
Date Time Interrogation Session: 20231012040026
HighPow Impedance: 68 Ohm
HighPow Impedance: 68 Ohm
Implantable Lead Implant Date: 20100416
Implantable Lead Implant Date: 20100416
Implantable Lead Location: 753859
Implantable Lead Location: 753860
Implantable Lead Model: 7122
Implantable Pulse Generator Implant Date: 20170626
Lead Channel Impedance Value: 390 Ohm
Lead Channel Impedance Value: 430 Ohm
Lead Channel Pacing Threshold Amplitude: 0.75 V
Lead Channel Pacing Threshold Amplitude: 1.25 V
Lead Channel Pacing Threshold Pulse Width: 0.5 ms
Lead Channel Pacing Threshold Pulse Width: 0.5 ms
Lead Channel Sensing Intrinsic Amplitude: 1.1 mV
Lead Channel Sensing Intrinsic Amplitude: 11.7 mV
Lead Channel Setting Pacing Amplitude: 2 V
Lead Channel Setting Pacing Amplitude: 2.5 V
Lead Channel Setting Pacing Pulse Width: 0.5 ms
Lead Channel Setting Sensing Sensitivity: 0.5 mV
Pulse Gen Serial Number: 7370130

## 2021-11-22 DIAGNOSIS — S335XXA Sprain of ligaments of lumbar spine, initial encounter: Secondary | ICD-10-CM | POA: Diagnosis not present

## 2021-11-22 DIAGNOSIS — M9902 Segmental and somatic dysfunction of thoracic region: Secondary | ICD-10-CM | POA: Diagnosis not present

## 2021-11-22 DIAGNOSIS — S233XXA Sprain of ligaments of thoracic spine, initial encounter: Secondary | ICD-10-CM | POA: Diagnosis not present

## 2021-11-22 DIAGNOSIS — M9903 Segmental and somatic dysfunction of lumbar region: Secondary | ICD-10-CM | POA: Diagnosis not present

## 2021-11-25 DIAGNOSIS — M9903 Segmental and somatic dysfunction of lumbar region: Secondary | ICD-10-CM | POA: Diagnosis not present

## 2021-11-25 DIAGNOSIS — S335XXA Sprain of ligaments of lumbar spine, initial encounter: Secondary | ICD-10-CM | POA: Diagnosis not present

## 2021-11-25 DIAGNOSIS — M9902 Segmental and somatic dysfunction of thoracic region: Secondary | ICD-10-CM | POA: Diagnosis not present

## 2021-11-25 DIAGNOSIS — S233XXA Sprain of ligaments of thoracic spine, initial encounter: Secondary | ICD-10-CM | POA: Diagnosis not present

## 2021-11-27 DIAGNOSIS — S233XXA Sprain of ligaments of thoracic spine, initial encounter: Secondary | ICD-10-CM | POA: Diagnosis not present

## 2021-11-27 DIAGNOSIS — S335XXA Sprain of ligaments of lumbar spine, initial encounter: Secondary | ICD-10-CM | POA: Diagnosis not present

## 2021-11-27 DIAGNOSIS — M9903 Segmental and somatic dysfunction of lumbar region: Secondary | ICD-10-CM | POA: Diagnosis not present

## 2021-11-27 DIAGNOSIS — M9902 Segmental and somatic dysfunction of thoracic region: Secondary | ICD-10-CM | POA: Diagnosis not present

## 2021-11-28 DIAGNOSIS — M9902 Segmental and somatic dysfunction of thoracic region: Secondary | ICD-10-CM | POA: Diagnosis not present

## 2021-11-28 DIAGNOSIS — M9903 Segmental and somatic dysfunction of lumbar region: Secondary | ICD-10-CM | POA: Diagnosis not present

## 2021-11-28 DIAGNOSIS — S233XXA Sprain of ligaments of thoracic spine, initial encounter: Secondary | ICD-10-CM | POA: Diagnosis not present

## 2021-11-28 DIAGNOSIS — S335XXA Sprain of ligaments of lumbar spine, initial encounter: Secondary | ICD-10-CM | POA: Diagnosis not present

## 2021-11-28 DIAGNOSIS — M545 Low back pain, unspecified: Secondary | ICD-10-CM | POA: Diagnosis not present

## 2021-11-28 NOTE — Progress Notes (Signed)
Remote ICD transmission.   

## 2021-12-06 DIAGNOSIS — M545 Low back pain, unspecified: Secondary | ICD-10-CM | POA: Diagnosis not present

## 2021-12-09 DIAGNOSIS — M545 Low back pain, unspecified: Secondary | ICD-10-CM | POA: Diagnosis not present

## 2021-12-09 DIAGNOSIS — M6281 Muscle weakness (generalized): Secondary | ICD-10-CM | POA: Diagnosis not present

## 2021-12-09 DIAGNOSIS — R269 Unspecified abnormalities of gait and mobility: Secondary | ICD-10-CM | POA: Diagnosis not present

## 2021-12-11 DIAGNOSIS — M5416 Radiculopathy, lumbar region: Secondary | ICD-10-CM | POA: Diagnosis not present

## 2021-12-12 ENCOUNTER — Ambulatory Visit: Payer: Medicare Other | Attending: Internal Medicine | Admitting: *Deleted

## 2021-12-12 DIAGNOSIS — Z5181 Encounter for therapeutic drug level monitoring: Secondary | ICD-10-CM

## 2021-12-12 DIAGNOSIS — I6350 Cerebral infarction due to unspecified occlusion or stenosis of unspecified cerebral artery: Secondary | ICD-10-CM

## 2021-12-12 LAB — POCT INR: INR: 2.8 (ref 2.0–3.0)

## 2021-12-12 NOTE — Patient Instructions (Signed)
Continue warfarin 1/2 tablet daily except 1 tablet on Mondays  Recheck in 4 wks

## 2021-12-16 DIAGNOSIS — M6281 Muscle weakness (generalized): Secondary | ICD-10-CM | POA: Diagnosis not present

## 2021-12-16 DIAGNOSIS — R269 Unspecified abnormalities of gait and mobility: Secondary | ICD-10-CM | POA: Diagnosis not present

## 2021-12-16 DIAGNOSIS — M545 Low back pain, unspecified: Secondary | ICD-10-CM | POA: Diagnosis not present

## 2021-12-17 DIAGNOSIS — M545 Low back pain, unspecified: Secondary | ICD-10-CM | POA: Diagnosis not present

## 2021-12-17 DIAGNOSIS — M6281 Muscle weakness (generalized): Secondary | ICD-10-CM | POA: Diagnosis not present

## 2021-12-17 DIAGNOSIS — R269 Unspecified abnormalities of gait and mobility: Secondary | ICD-10-CM | POA: Diagnosis not present

## 2021-12-25 DIAGNOSIS — R269 Unspecified abnormalities of gait and mobility: Secondary | ICD-10-CM | POA: Diagnosis not present

## 2021-12-25 DIAGNOSIS — M6281 Muscle weakness (generalized): Secondary | ICD-10-CM | POA: Diagnosis not present

## 2021-12-25 DIAGNOSIS — M545 Low back pain, unspecified: Secondary | ICD-10-CM | POA: Diagnosis not present

## 2021-12-30 DIAGNOSIS — R269 Unspecified abnormalities of gait and mobility: Secondary | ICD-10-CM | POA: Diagnosis not present

## 2021-12-30 DIAGNOSIS — M545 Low back pain, unspecified: Secondary | ICD-10-CM | POA: Diagnosis not present

## 2021-12-30 DIAGNOSIS — M6281 Muscle weakness (generalized): Secondary | ICD-10-CM | POA: Diagnosis not present

## 2022-01-07 ENCOUNTER — Encounter: Payer: Self-pay | Admitting: *Deleted

## 2022-01-07 DIAGNOSIS — M6281 Muscle weakness (generalized): Secondary | ICD-10-CM | POA: Diagnosis not present

## 2022-01-07 DIAGNOSIS — R269 Unspecified abnormalities of gait and mobility: Secondary | ICD-10-CM | POA: Diagnosis not present

## 2022-01-07 DIAGNOSIS — M545 Low back pain, unspecified: Secondary | ICD-10-CM | POA: Diagnosis not present

## 2022-01-09 ENCOUNTER — Ambulatory Visit: Payer: Medicare Other | Attending: Cardiology | Admitting: *Deleted

## 2022-01-09 DIAGNOSIS — M8580 Other specified disorders of bone density and structure, unspecified site: Secondary | ICD-10-CM | POA: Diagnosis not present

## 2022-01-09 DIAGNOSIS — C50919 Malignant neoplasm of unspecified site of unspecified female breast: Secondary | ICD-10-CM | POA: Diagnosis not present

## 2022-01-09 DIAGNOSIS — Z5181 Encounter for therapeutic drug level monitoring: Secondary | ICD-10-CM | POA: Diagnosis not present

## 2022-01-09 DIAGNOSIS — I6350 Cerebral infarction due to unspecified occlusion or stenosis of unspecified cerebral artery: Secondary | ICD-10-CM | POA: Diagnosis not present

## 2022-01-09 DIAGNOSIS — I5022 Chronic systolic (congestive) heart failure: Secondary | ICD-10-CM | POA: Diagnosis not present

## 2022-01-09 DIAGNOSIS — Z780 Asymptomatic menopausal state: Secondary | ICD-10-CM | POA: Diagnosis not present

## 2022-01-09 DIAGNOSIS — E782 Mixed hyperlipidemia: Secondary | ICD-10-CM | POA: Diagnosis not present

## 2022-01-09 DIAGNOSIS — M810 Age-related osteoporosis without current pathological fracture: Secondary | ICD-10-CM | POA: Diagnosis not present

## 2022-01-09 LAB — POCT INR: INR: 2.7 (ref 2.0–3.0)

## 2022-01-09 NOTE — Patient Instructions (Signed)
Continue warfarin 1/2 tablet daily except 1 tablet on Mondays  Recheck in 6 wks

## 2022-01-13 ENCOUNTER — Encounter: Payer: Self-pay | Admitting: *Deleted

## 2022-01-14 DIAGNOSIS — M6281 Muscle weakness (generalized): Secondary | ICD-10-CM | POA: Diagnosis not present

## 2022-01-14 DIAGNOSIS — M545 Low back pain, unspecified: Secondary | ICD-10-CM | POA: Diagnosis not present

## 2022-01-14 DIAGNOSIS — R269 Unspecified abnormalities of gait and mobility: Secondary | ICD-10-CM | POA: Diagnosis not present

## 2022-01-28 ENCOUNTER — Other Ambulatory Visit: Payer: Self-pay

## 2022-01-28 DIAGNOSIS — Z5181 Encounter for therapeutic drug level monitoring: Secondary | ICD-10-CM

## 2022-01-28 MED ORDER — WARFARIN SODIUM 5 MG PO TABS: ORAL_TABLET | ORAL | 0 refills | Status: DC

## 2022-01-28 NOTE — Telephone Encounter (Signed)
Prescription refill request received for warfarin Lov: 09/17/21 (Hochrein)  Next INR check: 02/20/22 Warfarin tablet strength: '5mg'$   Appropriate dose and refill sent to requested pharmacy.

## 2022-02-17 DIAGNOSIS — C50919 Malignant neoplasm of unspecified site of unspecified female breast: Secondary | ICD-10-CM | POA: Diagnosis not present

## 2022-02-19 DIAGNOSIS — I4729 Other ventricular tachycardia: Secondary | ICD-10-CM | POA: Diagnosis not present

## 2022-02-19 DIAGNOSIS — Z95810 Presence of automatic (implantable) cardiac defibrillator: Secondary | ICD-10-CM | POA: Diagnosis not present

## 2022-02-19 DIAGNOSIS — I420 Dilated cardiomyopathy: Secondary | ICD-10-CM | POA: Diagnosis not present

## 2022-02-19 DIAGNOSIS — Z853 Personal history of malignant neoplasm of breast: Secondary | ICD-10-CM | POA: Diagnosis not present

## 2022-02-19 DIAGNOSIS — C50919 Malignant neoplasm of unspecified site of unspecified female breast: Secondary | ICD-10-CM | POA: Diagnosis not present

## 2022-02-20 ENCOUNTER — Ambulatory Visit (INDEPENDENT_AMBULATORY_CARE_PROVIDER_SITE_OTHER): Payer: Medicare Other

## 2022-02-20 DIAGNOSIS — I420 Dilated cardiomyopathy: Secondary | ICD-10-CM

## 2022-02-20 LAB — CUP PACEART REMOTE DEVICE CHECK
Battery Remaining Longevity: 41 mo
Battery Remaining Percentage: 42 %
Battery Voltage: 2.9 V
Brady Statistic AP VP Percent: 1 %
Brady Statistic AP VS Percent: 14 %
Brady Statistic AS VP Percent: 1 %
Brady Statistic AS VS Percent: 83 %
Brady Statistic RA Percent Paced: 13 %
Brady Statistic RV Percent Paced: 1 %
Date Time Interrogation Session: 20240111052943
HighPow Impedance: 68 Ohm
HighPow Impedance: 68 Ohm
Implantable Lead Connection Status: 753985
Implantable Lead Connection Status: 753985
Implantable Lead Implant Date: 20100416
Implantable Lead Implant Date: 20100416
Implantable Lead Location: 753859
Implantable Lead Location: 753860
Implantable Lead Model: 7122
Implantable Pulse Generator Implant Date: 20170626
Lead Channel Impedance Value: 390 Ohm
Lead Channel Impedance Value: 400 Ohm
Lead Channel Pacing Threshold Amplitude: 0.75 V
Lead Channel Pacing Threshold Amplitude: 1.25 V
Lead Channel Pacing Threshold Pulse Width: 0.5 ms
Lead Channel Pacing Threshold Pulse Width: 0.5 ms
Lead Channel Sensing Intrinsic Amplitude: 1.3 mV
Lead Channel Sensing Intrinsic Amplitude: 11.7 mV
Lead Channel Setting Pacing Amplitude: 2 V
Lead Channel Setting Pacing Amplitude: 2.5 V
Lead Channel Setting Pacing Pulse Width: 0.5 ms
Lead Channel Setting Sensing Sensitivity: 0.5 mV
Pulse Gen Serial Number: 7370130

## 2022-02-24 ENCOUNTER — Ambulatory Visit: Payer: Medicare Other | Attending: Cardiology | Admitting: *Deleted

## 2022-02-24 DIAGNOSIS — I6350 Cerebral infarction due to unspecified occlusion or stenosis of unspecified cerebral artery: Secondary | ICD-10-CM

## 2022-02-24 DIAGNOSIS — Z5181 Encounter for therapeutic drug level monitoring: Secondary | ICD-10-CM | POA: Diagnosis not present

## 2022-02-24 LAB — POCT INR: INR: 1.7 — AB (ref 2.0–3.0)

## 2022-02-24 NOTE — Patient Instructions (Signed)
Take warfarin 1 1/2 tablets tonight, 1 tablet tomorrow night then resume 1/2 tablet daily except 1 tablet on Mondays  Recheck in 3 wks

## 2022-03-04 DIAGNOSIS — Z853 Personal history of malignant neoplasm of breast: Secondary | ICD-10-CM | POA: Insufficient documentation

## 2022-03-12 ENCOUNTER — Telehealth: Payer: Self-pay | Admitting: Cardiology

## 2022-03-12 NOTE — Progress Notes (Signed)
Remote ICD transmission.   

## 2022-03-12 NOTE — Telephone Encounter (Signed)
Spoke with pt, aware will get signed next week when dr hochrein returns to the office. Will make dr hochrein aware of her brother's situation.

## 2022-03-12 NOTE — Telephone Encounter (Signed)
Pt c/o medication issue:  1. Name of Medication:   sacubitril-valsartan (ENTRESTO) 97-103 MG   2. How are you currently taking this medication (dosage and times per day)?   As prescribed  3. Are you having a reaction (difficulty breathing--STAT)?   No  4. What is your medication issue?   Patient stated she will need a prescription for this medication faxed to [employer] - fax# [phone].    Patient also wanted Dr. Percival Spanish to know that her brother, Gus Alford Highland, has been diagnosed with colon cancer and he now has to have a colostomy bag.

## 2022-03-13 DIAGNOSIS — E119 Type 2 diabetes mellitus without complications: Secondary | ICD-10-CM | POA: Diagnosis not present

## 2022-03-17 ENCOUNTER — Ambulatory Visit: Payer: Medicare Other | Attending: Internal Medicine | Admitting: *Deleted

## 2022-03-17 DIAGNOSIS — Z5181 Encounter for therapeutic drug level monitoring: Secondary | ICD-10-CM | POA: Diagnosis not present

## 2022-03-17 DIAGNOSIS — I6350 Cerebral infarction due to unspecified occlusion or stenosis of unspecified cerebral artery: Secondary | ICD-10-CM

## 2022-03-17 LAB — POCT INR: INR: 1.9 — AB (ref 2.0–3.0)

## 2022-03-17 NOTE — Patient Instructions (Signed)
Increase warfarin to 1/2 tablet daily except 1 tablet on Mondays and Thursdays Recheck in 4 wks

## 2022-04-07 DIAGNOSIS — E782 Mixed hyperlipidemia: Secondary | ICD-10-CM | POA: Diagnosis not present

## 2022-04-07 DIAGNOSIS — N183 Chronic kidney disease, stage 3 unspecified: Secondary | ICD-10-CM | POA: Diagnosis not present

## 2022-04-07 DIAGNOSIS — I10 Essential (primary) hypertension: Secondary | ICD-10-CM | POA: Diagnosis not present

## 2022-04-07 DIAGNOSIS — E1122 Type 2 diabetes mellitus with diabetic chronic kidney disease: Secondary | ICD-10-CM | POA: Diagnosis not present

## 2022-04-07 DIAGNOSIS — E7849 Other hyperlipidemia: Secondary | ICD-10-CM | POA: Diagnosis not present

## 2022-04-07 DIAGNOSIS — E1165 Type 2 diabetes mellitus with hyperglycemia: Secondary | ICD-10-CM | POA: Diagnosis not present

## 2022-04-08 DIAGNOSIS — Z170 Estrogen receptor positive status [ER+]: Secondary | ICD-10-CM | POA: Diagnosis not present

## 2022-04-08 DIAGNOSIS — C50912 Malignant neoplasm of unspecified site of left female breast: Secondary | ICD-10-CM | POA: Diagnosis not present

## 2022-04-14 ENCOUNTER — Ambulatory Visit: Payer: Medicare Other | Attending: Internal Medicine | Admitting: *Deleted

## 2022-04-14 DIAGNOSIS — E7849 Other hyperlipidemia: Secondary | ICD-10-CM | POA: Diagnosis not present

## 2022-04-14 DIAGNOSIS — I6350 Cerebral infarction due to unspecified occlusion or stenosis of unspecified cerebral artery: Secondary | ICD-10-CM

## 2022-04-14 DIAGNOSIS — I420 Dilated cardiomyopathy: Secondary | ICD-10-CM | POA: Diagnosis not present

## 2022-04-14 DIAGNOSIS — Z5181 Encounter for therapeutic drug level monitoring: Secondary | ICD-10-CM

## 2022-04-14 DIAGNOSIS — I5022 Chronic systolic (congestive) heart failure: Secondary | ICD-10-CM | POA: Diagnosis not present

## 2022-04-14 DIAGNOSIS — E1122 Type 2 diabetes mellitus with diabetic chronic kidney disease: Secondary | ICD-10-CM | POA: Diagnosis not present

## 2022-04-14 DIAGNOSIS — D6869 Other thrombophilia: Secondary | ICD-10-CM | POA: Diagnosis not present

## 2022-04-14 DIAGNOSIS — R030 Elevated blood-pressure reading, without diagnosis of hypertension: Secondary | ICD-10-CM | POA: Diagnosis not present

## 2022-04-14 DIAGNOSIS — E876 Hypokalemia: Secondary | ICD-10-CM | POA: Diagnosis not present

## 2022-04-14 DIAGNOSIS — N1831 Chronic kidney disease, stage 3a: Secondary | ICD-10-CM | POA: Diagnosis not present

## 2022-04-14 LAB — POCT INR: INR: 2.5 (ref 2.0–3.0)

## 2022-04-14 NOTE — Patient Instructions (Signed)
Continue warfarin 1/2 tablet daily except 1 tablet on Mondays and Thursdays Recheck in 4 wks

## 2022-04-15 ENCOUNTER — Encounter (INDEPENDENT_AMBULATORY_CARE_PROVIDER_SITE_OTHER): Payer: Self-pay | Admitting: *Deleted

## 2022-04-18 ENCOUNTER — Telehealth: Payer: Self-pay | Admitting: Cardiology

## 2022-04-18 NOTE — Telephone Encounter (Signed)
  Pt would like to send this message to Dr. Percival Spanish. Her brother Gus Alford Highland died the reason why she has to reschedule her appt next week. She said her brother was once a patient of Dr. Percival Spanish too, she wants to let him know

## 2022-04-18 NOTE — Telephone Encounter (Signed)
Will route to MD to make aware.   Thanks! 

## 2022-04-24 ENCOUNTER — Other Ambulatory Visit: Payer: Self-pay

## 2022-04-24 ENCOUNTER — Ambulatory Visit: Payer: Medicare Other | Admitting: Cardiology

## 2022-04-24 DIAGNOSIS — I5022 Chronic systolic (congestive) heart failure: Secondary | ICD-10-CM

## 2022-04-24 MED ORDER — SPIRONOLACTONE 25 MG PO TABS: 12.5000 mg | ORAL_TABLET | Freq: Every day | ORAL | 1 refills | Status: DC

## 2022-04-24 MED ORDER — METOPROLOL SUCCINATE ER 50 MG PO TB24: 50.0000 mg | ORAL_TABLET | Freq: Two times a day (BID) | ORAL | 2 refills | Status: DC

## 2022-04-30 ENCOUNTER — Encounter (INDEPENDENT_AMBULATORY_CARE_PROVIDER_SITE_OTHER): Payer: Self-pay

## 2022-04-30 ENCOUNTER — Telehealth (INDEPENDENT_AMBULATORY_CARE_PROVIDER_SITE_OTHER): Payer: Self-pay | Admitting: Gastroenterology

## 2022-04-30 NOTE — Telephone Encounter (Signed)
Who is your primary care physician: Dr.Terry Quillian Quince   Reasons for the colonoscopy: History of polyps  Have you had a colonoscopy before? Yes 2018  Do you have family history of colon cancer? Yes, brother  Previous colonoscopy with polyps removed? 2018  Do you have a history colorectal cancer?   No  Are you diabetic? If yes, Type 1 or Type 2?    Yes Type 2  Do you have a prosthetic or mechanical heart valve? No  Do you have a pacemaker/defibrillator?   Yes  Have you had endocarditis/atrial fibrillation? No  Have you had joint replacement within the last 12 months?  No  Do you tend to be constipated or have to use laxatives? No  Do you have any history of drugs or alchohol?  No  Do you use supplemental oxygen?  No  Have you had a stroke or heart attack within the last 6 months?No  Do you take weight loss medication? No  For female patients: have you had a hysterectomy?  Yes                                     are you post menopausal?       Yes                                            do you still have your menstrual cycle? No      Do you take any blood-thinning medications such as: (aspirin, warfarin, Plavix, Aggrenox)  Yes  If yes we need the name, milligram, dosage and who is prescribing doctor-Warfarin 5 mg Monday and Thursday 2.5 mg other days of week  Current Outpatient Medications on File Prior to Visit  Medication Sig Dispense Refill   anastrozole (ARIMIDEX) 1 MG tablet Take 1 mg by mouth daily.     ascorbic acid (VITAMIN C) 500 MG tablet Take 1 tablet (500 mg total) by mouth daily. 30 tablet 2   atorvastatin (LIPITOR) 20 MG tablet Take 1 tablet (20 mg total) by mouth daily. 30 tablet 2   BIOTIN PO Take 1 tablet by mouth daily.     digoxin (LANOXIN) 0.125 MG tablet TAKE ONE TABLET BY MOUTH DAILY. 90 tablet 3   empagliflozin (JARDIANCE) 10 MG TABS tablet Take 1 tablet (10 mg total) by mouth daily before breakfast. 90 tablet 3   furosemide (LASIX) 40 MG tablet Take  2 tablets (80 mg total) by mouth every other day. 90 tablet 3   letrozole (FEMARA) 2.5 MG tablet Take 2.5 mg by mouth daily.     metoprolol succinate (TOPROL-XL) 50 MG 24 hr tablet Take 1 tablet (50 mg total) by mouth 2 (two) times daily. Take with or immediately following a meal. 60 tablet 2   Multiple Vitamin (MULTIVITAMIN WITH MINERALS) TABS tablet Take 1 tablet by mouth daily.     Omega-3 Fatty Acids (FISH OIL PO) Take 1 capsule by mouth daily.      sacubitril-valsartan (ENTRESTO) 97-103 MG Take 1 tablet by mouth 2 (two) times daily. 180 tablet 3   spironolactone (ALDACTONE) 25 MG tablet Take 0.5 tablets (12.5 mg total) by mouth daily. 45 tablet 1   warfarin (COUMADIN) 5 MG tablet TAKE 1/2 TABLET TO 1 TABLET BY MOUTH DAILY AS DIRECTED BY COUMADIN CLINIC  60 tablet 0   zinc gluconate 50 MG tablet Take 50 mg by mouth daily.     No current facility-administered medications on file prior to visit.    No Known Allergies   Pharmacy: [facility]  Primary Insurance Name: [employer] number where you can be reached: [phone]

## 2022-04-30 NOTE — Telephone Encounter (Signed)
Room 3, needs cardiology clearance (seeing them on 4/1, please ask them to get clearance in that appointment), needs to get clearance to hold warfarin by them as well. Jardiance per protocol Thanks

## 2022-05-01 ENCOUNTER — Telehealth (INDEPENDENT_AMBULATORY_CARE_PROVIDER_SITE_OTHER): Payer: Self-pay | Admitting: Gastroenterology

## 2022-05-01 NOTE — Telephone Encounter (Signed)
Cardiac/warfarin clearance sent

## 2022-05-01 NOTE — Telephone Encounter (Signed)
    05/01/22  [name] [name] Aug 06, 1950  What type of surgery is being performed? Colonoscopy   When is surgery scheduled? TBD  Cardiac clearance needed. Also clearance to hold warfarin   Name of physician performing surgery?  Dr. Maylon Peppers [facility] Phone: [phone] Fax: [phone]  Anethesia type (none, local, MAC, general)? MAC

## 2022-05-02 NOTE — Telephone Encounter (Signed)
Spoke with patient who states that she wants to keep her appointment on 4/26 with Dr. Percival Spanish and it can be addressed then. I will update appointment notes to reflect pre-op clearance.

## 2022-05-02 NOTE — Telephone Encounter (Addendum)
Patient with diagnosis of stroke in 2010 on warfarin for anticoagulation.    Procedure: colonoscopy Date of procedure: TBD  CrCl 65mL/min Platelet count 186K  Pt takes warfarin for hx of cardioembolic stroke. She has been bridged with Lovenox for past procedures (2017-2018) per Dr Hochrein's recommendation. Recommend she hold warfarin for 5 days with Lovenox bridge for this procedure as well.  Pt is followed in [facility] Coumadin clinic where bridge can be coordinated. Pt should call Coumadin clinic once she gets procedure date in case her INR check needs to be moved up to coordinate bridging.  **This guidance is not considered finalized until pre-operative APP has relayed final recommendations.**

## 2022-05-02 NOTE — Telephone Encounter (Signed)
   Name: [name] [name]  DOB: [birth year]  MRN: [id number]  Primary Cardiologist: Minus Breeding, MD  Chart reviewed as part of pre-operative protocol coverage. Because of [name]'s past medical history and time since last visit, she will require a follow-up telephone visit in order to better assess preoperative cardiovascular risk.  Pre-op covering staff: - Please schedule appointment and call patient to inform them. If patient already had an upcoming appointment within acceptable timeframe, please add "pre-op clearance" to the appointment notes so provider is aware. - Please contact requesting surgeon's office via preferred method (i.e, phone, fax) to inform them of need for appointment prior to surgery.   Pt takes warfarin for hx of cardioembolic stroke. She has been bridged with Lovenox for past procedures (2017-2018) per Dr Hochrein's recommendation. Recommend she hold warfarin for 5 days with Lovenox bridge for this procedure as well.  Pt is followed in [facility] Coumadin clinic where bridge can be coordinated. Pt should call Coumadin clinic once she gets procedure date in case her INR check needs to be moved up to coordinate bridging.   Elgie Collard, PA-C  05/02/2022, 12:17 PM

## 2022-05-06 NOTE — Telephone Encounter (Signed)
Spoke with patient who states that she wants to keep her appointment on 4/26 with Dr. Percival Spanish and it can be addressed then. I will update appointment notes to reflect pre-op clearance.

## 2022-05-12 ENCOUNTER — Ambulatory Visit: Payer: Medicare Other | Attending: Cardiology | Admitting: *Deleted

## 2022-05-12 DIAGNOSIS — Z5181 Encounter for therapeutic drug level monitoring: Secondary | ICD-10-CM | POA: Diagnosis not present

## 2022-05-12 DIAGNOSIS — I6350 Cerebral infarction due to unspecified occlusion or stenosis of unspecified cerebral artery: Secondary | ICD-10-CM

## 2022-05-12 LAB — POCT INR: INR: 3.3 — AB (ref 2.0–3.0)

## 2022-05-12 NOTE — Patient Instructions (Signed)
Hold warfarin tonight then resume 1/2 tablet daily except 1 tablet on Mondays and Thursdays Recheck in 4 wks

## 2022-05-22 ENCOUNTER — Ambulatory Visit (INDEPENDENT_AMBULATORY_CARE_PROVIDER_SITE_OTHER): Payer: Medicare Other

## 2022-05-22 DIAGNOSIS — I420 Dilated cardiomyopathy: Secondary | ICD-10-CM

## 2022-05-26 LAB — CUP PACEART REMOTE DEVICE CHECK
Battery Remaining Longevity: 39 mo
Battery Remaining Percentage: 40 %
Battery Voltage: 2.9 V
Brady Statistic AP VP Percent: 1 %
Brady Statistic AP VS Percent: 17 %
Brady Statistic AS VP Percent: 1 %
Brady Statistic AS VS Percent: 81 %
Brady Statistic RA Percent Paced: 15 %
Brady Statistic RV Percent Paced: 1 %
Date Time Interrogation Session: 20240414000158
HighPow Impedance: 72 Ohm
HighPow Impedance: 72 Ohm
Implantable Lead Connection Status: 753985
Implantable Lead Connection Status: 753985
Implantable Lead Implant Date: 20100416
Implantable Lead Implant Date: 20100416
Implantable Lead Location: 753859
Implantable Lead Location: 753860
Implantable Lead Model: 7122
Implantable Pulse Generator Implant Date: 20170626
Lead Channel Impedance Value: 400 Ohm
Lead Channel Impedance Value: 460 Ohm
Lead Channel Pacing Threshold Amplitude: 0.75 V
Lead Channel Pacing Threshold Amplitude: 1.25 V
Lead Channel Pacing Threshold Pulse Width: 0.5 ms
Lead Channel Pacing Threshold Pulse Width: 0.5 ms
Lead Channel Sensing Intrinsic Amplitude: 1.4 mV
Lead Channel Sensing Intrinsic Amplitude: 12 mV
Lead Channel Setting Pacing Amplitude: 2 V
Lead Channel Setting Pacing Amplitude: 2.5 V
Lead Channel Setting Pacing Pulse Width: 0.5 ms
Lead Channel Setting Sensing Sensitivity: 0.5 mV
Pulse Gen Serial Number: 7370130

## 2022-06-06 ENCOUNTER — Ambulatory Visit: Payer: Medicare Other | Admitting: Cardiology

## 2022-06-09 ENCOUNTER — Ambulatory Visit: Payer: Medicare Other | Attending: Cardiology | Admitting: *Deleted

## 2022-06-09 DIAGNOSIS — I6350 Cerebral infarction due to unspecified occlusion or stenosis of unspecified cerebral artery: Secondary | ICD-10-CM

## 2022-06-09 DIAGNOSIS — Z5181 Encounter for therapeutic drug level monitoring: Secondary | ICD-10-CM

## 2022-06-09 LAB — POCT INR: INR: 2.8 (ref 2.0–3.0)

## 2022-06-09 NOTE — Patient Instructions (Signed)
Continue warfarin 1/2 tablet daily except 1 tablet on Mondays and Thursdays Recheck in 5 wks

## 2022-06-10 DIAGNOSIS — I5022 Chronic systolic (congestive) heart failure: Secondary | ICD-10-CM | POA: Diagnosis not present

## 2022-06-10 DIAGNOSIS — E1122 Type 2 diabetes mellitus with diabetic chronic kidney disease: Secondary | ICD-10-CM | POA: Diagnosis not present

## 2022-06-10 DIAGNOSIS — E782 Mixed hyperlipidemia: Secondary | ICD-10-CM | POA: Diagnosis not present

## 2022-06-19 NOTE — Progress Notes (Signed)
  Cardiology Office Note:   Date:  06/20/2022  ID:  OYUKI LOMACK, DOB 08/15/1950, MRN [id number]   History of Present Illness:   [name] [name] is a 72 y.o. female who presents for follow up of cardiomyopathy.  She has a history of a nonischemic cardiomyopathy. Her ejection fraction was at one point in time 15 - 20%. A later ejection fraction was about 40% in 2015.   Most recently the EF was 25% again.   I sent her for a CPX in 2018 which demonstrated a good VO2 Max.    She saw Dr. Graciela Husbands and she reported increased DOE in June of 2023. Marland Kitchen  He suggested that they could consider CRT upgrade.  She opted not to do that at that time.    She has been doing well since I last saw her.  She does get some dizziness mildly.  She has blood pressures that sometimes go into the 80s systolic.  She denies any presyncope or syncope.  She denies any new shortness of breath, PND or orthopnea.  She has had no weight gain or edema.  ROS: As stated in the HPI and negative for all other systems.  Studies Reviewed:    EKG: Atrial paced rhythm, left bundle branch block, rate 52    Risk Assessment/Calculations:              Physical Exam:   VS:  BP 100/74   Pulse (!) 52   Ht 5\' 8"  (1.727 m)   Wt 161 lb 9.6 oz (73.3 kg)   SpO2 97%   BMI 24.57 kg/m    Wt Readings from Last 3 Encounters:  06/20/22 161 lb 9.6 oz (73.3 kg)  09/17/21 166 lb 6.4 oz (75.5 kg)  07/11/21 164 lb (74.4 kg)     GEN: Well nourished, well developed in no acute distress NECK: No JVD; possible left carotid carotid bruit CARDIAC: RRR, no murmurs, rubs, gallops RESPIRATORY:  Clear to auscultation without rales, wheezing or rhonchi  ABDOMEN: Soft, non-tender, non-distended EXTREMITIES:  No edema; No deformity   ASSESSMENT AND PLAN:   CARDIOMYOPATHY, PRIMARY, DILATED: We talked about possibly reducing her meds but she would prefer to stay on the current dose despite the low blood pressure she is not particularly symptomatic.  She  does not want to consider BiV upgrade at this point.  ICD she is up-to-date with follow-up and I did check the last device interrogation.  BRUIT: I will get carotid Dopplers.      Signed, Rollene Rotunda, MD

## 2022-06-20 ENCOUNTER — Encounter: Payer: Self-pay | Admitting: Cardiology

## 2022-06-20 ENCOUNTER — Ambulatory Visit: Payer: Medicare Other | Attending: Cardiology | Admitting: Cardiology

## 2022-06-20 VITALS — BP 100/74 | HR 52 | Ht 68.0 in | Wt 161.6 lb

## 2022-06-20 DIAGNOSIS — I420 Dilated cardiomyopathy: Secondary | ICD-10-CM

## 2022-06-20 DIAGNOSIS — R0989 Other specified symptoms and signs involving the circulatory and respiratory systems: Secondary | ICD-10-CM

## 2022-06-20 NOTE — Patient Instructions (Signed)
  Testing/Procedures:  Your physician has requested that you have a carotid duplex. This test is an ultrasound of the carotid arteries in your neck. It looks at blood flow through these arteries that supply the brain with blood. Allow one hour for this exam. There are no restrictions or special instructions. [facility] [facility]   Follow-Up: At [facility], you and your health needs are our priority.  As part of our continuing mission to provide you with exceptional heart care, we have created designated Provider Care Teams.  These Care Teams include your primary Cardiologist (physician) and Advanced Practice Providers (APPs -  Physician Assistants and Nurse Practitioners) who all work together to provide you with the care you need, when you need it.  We recommend signing up for the patient portal called "MyChart".  Sign up information is provided on this After Visit Summary.  MyChart is used to connect with patients for Virtual Visits (Telemedicine).  Patients are able to view lab/test results, encounter notes, upcoming appointments, etc.  Non-urgent messages can be sent to your provider as well.   To learn more about what you can do with MyChart, go to [email or web address].    Your next appointment:   6 month(s)  Provider:   Rollene Rotunda, MD

## 2022-06-26 NOTE — Telephone Encounter (Signed)
   Primary Cardiologist: Rollene Rotunda, MD  Chart reviewed as part of pre-operative protocol coverage. Given past medical history and time since last visit, based on ACC/AHA guidelines, [name] [name] would be at acceptable risk for the planned procedure without further cardiovascular testing.   Patient was advised that if she develops new symptoms prior to surgery to contact our office to arrange a follow-up appointment. She verbalized understanding.  She may hold warfarin for 5 days prior to procedure. Lovenox bridge is recommended. I will forward message to  Coumadin clinic to coordinate bridging.   I will route this recommendation to the requesting party via Epic fax function and remove from pre-op pool.  Please call with questions.  Levi Aland, NP-C  06/26/2022, 2:33 PM [address] Office [phone] Fax [phone]

## 2022-06-26 NOTE — Telephone Encounter (Signed)
Left message to return call 

## 2022-06-26 NOTE — Telephone Encounter (Signed)
Any update on medication clearance? Please advise. Thank you

## 2022-06-26 NOTE — Telephone Encounter (Signed)
Left message to return call      Primary Cardiologist: Rollene Rotunda, MD   Chart reviewed as part of pre-operative protocol coverage. Given past medical history and time since last visit, based on ACC/AHA guidelines, Onnalee Berro Lords would be at acceptable risk for the planned procedure without further cardiovascular testing.    Patient was advised that if she develops new symptoms prior to surgery to contact our office to arrange a follow-up appointment. She verbalized understanding.   She may hold warfarin for 5 days prior to procedure. Lovenox bridge is recommended. I will forward message to [facility] to coordinate bridging.    I will route this recommendation to the requesting party via Epic fax function and remove from pre-op pool.   Please call with questions.   Levi Aland, NP-C  06/26/2022, 2:33 PM [address] Office [phone] Fax [phone]             06/26/22  1:23 PM The patient is okay for the procedure from a cardiovascular standpoint and she would need bridging.

## 2022-06-26 NOTE — Telephone Encounter (Signed)
Dr. Antoine Poche, patient recently seen by you on 06/20/22 who is seeking cardiac risk assessment for upcoming colonoscopy. I see that she has carotid duplex pending. Can you please comment on cardiac risk for procedure and send your response to p cv div preop?   Pt takes warfarin for hx of cardioembolic stroke. She has been bridged with Lovenox for past procedures (2017-2018) per Dr Hochrein's recommendation. Recommend she hold warfarin for 5 days with Lovenox bridge for this procedure as well.  Pt is followed in [facility] where bridge can be coordinated. Pt should call Coumadin clinic once she gets procedure date in case her INR check needs to be moved up to coordinate bridging.  Thank  you, Marcelino Duster

## 2022-06-26 NOTE — Progress Notes (Signed)
Remote ICD transmission.   

## 2022-06-27 ENCOUNTER — Ambulatory Visit: Payer: Medicare Other | Admitting: Cardiology

## 2022-06-30 ENCOUNTER — Encounter (INDEPENDENT_AMBULATORY_CARE_PROVIDER_SITE_OTHER): Payer: Self-pay

## 2022-06-30 ENCOUNTER — Encounter (HOSPITAL_COMMUNITY): Payer: Medicare Other

## 2022-06-30 MED ORDER — PEG 3350-KCL-NA BICARB-NACL 420 G PO SOLR: 4000.0000 mL | Freq: Once | ORAL | 0 refills | Status: AC

## 2022-06-30 NOTE — Addendum Note (Signed)
Addended by: Marlowe Shores on: 06/30/2022 11:07 AM   Modules accepted: Orders

## 2022-06-30 NOTE — Telephone Encounter (Signed)
Pt left message returning call.  Returned call to patient. Pt has been scheduled for 08/19/22 at 10:00am. Prep sent to pharmacy and instructions sent via mail.  Will send letter with pre op appt.  Per [facility]- Notification or Prior Authorization is not required for the requested services You are not required to submit a notification/prior authorization based on the information provided. If you have general questions about the prior authorization requirements, visit [email or web address] > Clinician Resources > Advance and Admission Notification Requirements. The number above acknowledges your notification. Please write this reference number down for future reference. If you would like to request an organization determination, please call us at [phone]. Decision ID #: [id number]

## 2022-07-04 ENCOUNTER — Ambulatory Visit (HOSPITAL_COMMUNITY)
Admission: RE | Admit: 2022-07-04 | Discharge: 2022-07-04 | Disposition: A | Discharge status: Home / Self Care | Payer: Medicare Other | Source: Ambulatory Visit | Attending: Cardiology | Admitting: Cardiology

## 2022-07-04 DIAGNOSIS — R0989 Other specified symptoms and signs involving the circulatory and respiratory systems: Secondary | ICD-10-CM | POA: Insufficient documentation

## 2022-07-08 ENCOUNTER — Encounter (INDEPENDENT_AMBULATORY_CARE_PROVIDER_SITE_OTHER): Payer: Self-pay | Admitting: *Deleted

## 2022-07-09 ENCOUNTER — Encounter: Payer: Self-pay | Admitting: *Deleted

## 2022-07-09 ENCOUNTER — Telehealth (INDEPENDENT_AMBULATORY_CARE_PROVIDER_SITE_OTHER): Payer: Self-pay | Admitting: Gastroenterology

## 2022-07-09 NOTE — Telephone Encounter (Signed)
Pt called in and would like to reschedule TCS. Pt originally scheduled for 08/19/22 at 10am. Pt is going out of town to be with grandchildren. Dr.Castaneda July schedule is not completely out yet-advised pt I would call back once July schedule is out (dates that we had did not work for pt).

## 2022-07-14 ENCOUNTER — Encounter (INDEPENDENT_AMBULATORY_CARE_PROVIDER_SITE_OTHER): Payer: Self-pay

## 2022-07-14 ENCOUNTER — Ambulatory Visit: Payer: Medicare Other | Attending: Internal Medicine | Admitting: *Deleted

## 2022-07-14 DIAGNOSIS — I6350 Cerebral infarction due to unspecified occlusion or stenosis of unspecified cerebral artery: Secondary | ICD-10-CM | POA: Diagnosis not present

## 2022-07-14 DIAGNOSIS — Z5181 Encounter for therapeutic drug level monitoring: Secondary | ICD-10-CM

## 2022-07-14 LAB — POCT INR: INR: 3.4 — AB (ref 2.0–3.0)

## 2022-07-14 NOTE — Patient Instructions (Signed)
Hold warfarin tonight then resume 1/2 tablet daily except 1 tablet on Mondays and Thursdays Recheck in 4 wks Pending Colonoscopy.  Needs Lovenox bridge

## 2022-07-14 NOTE — Telephone Encounter (Signed)
Left message to return call 

## 2022-07-14 NOTE — Telephone Encounter (Signed)
Pt left voicemail returning call. Returned call to patient. Pt scheduled for 09/09/22 at 8:30 am. Instructions will be mailed to pt. Prep sent to pharmacy. Will send letter with pre op date once pre op appt received.

## 2022-07-16 ENCOUNTER — Encounter (INDEPENDENT_AMBULATORY_CARE_PROVIDER_SITE_OTHER): Payer: Self-pay

## 2022-07-18 ENCOUNTER — Telehealth (INDEPENDENT_AMBULATORY_CARE_PROVIDER_SITE_OTHER): Payer: Self-pay | Admitting: Gastroenterology

## 2022-07-18 ENCOUNTER — Encounter (INDEPENDENT_AMBULATORY_CARE_PROVIDER_SITE_OTHER): Payer: Self-pay

## 2022-07-18 ENCOUNTER — Other Ambulatory Visit: Payer: Self-pay | Admitting: Internal Medicine

## 2022-07-18 NOTE — Telephone Encounter (Signed)
Message from [place] at Endo stating that I had placed 2 cases on for 8:30 AM on 09/09/22. Pt was not showing up on my scheduled for 7/30 but was on [employer]. Contacted pt and asked if we could reschedule. Pt left on 09/09/22 schedule but moved down to 2:15 pm. Will mail out updated instructions.

## 2022-07-30 ENCOUNTER — Ambulatory Visit: Payer: Medicare Other | Attending: Internal Medicine | Admitting: Internal Medicine

## 2022-07-30 ENCOUNTER — Encounter: Payer: Self-pay | Admitting: Internal Medicine

## 2022-07-30 VITALS — BP 100/64 | HR 57 | Ht 68.0 in | Wt 163.4 lb

## 2022-07-30 DIAGNOSIS — Z79899 Other long term (current) drug therapy: Secondary | ICD-10-CM | POA: Diagnosis not present

## 2022-07-30 DIAGNOSIS — D649 Anemia, unspecified: Secondary | ICD-10-CM | POA: Diagnosis not present

## 2022-07-30 DIAGNOSIS — I4729 Other ventricular tachycardia: Secondary | ICD-10-CM | POA: Diagnosis not present

## 2022-07-30 DIAGNOSIS — I5022 Chronic systolic (congestive) heart failure: Secondary | ICD-10-CM | POA: Diagnosis not present

## 2022-07-30 LAB — CUP PACEART INCLINIC DEVICE CHECK
Battery Remaining Longevity: 40 mo
Brady Statistic RA Percent Paced: 15 %
Brady Statistic RV Percent Paced: 0.46 %
Date Time Interrogation Session: 20240619122714
HighPow Impedance: 74.25 Ohm
Implantable Lead Connection Status: 753985
Implantable Lead Connection Status: 753985
Implantable Lead Implant Date: 20100416
Implantable Lead Implant Date: 20100416
Implantable Lead Location: 753859
Implantable Lead Location: 753860
Implantable Lead Model: 7122
Implantable Pulse Generator Implant Date: 20170626
Lead Channel Impedance Value: 425 Ohm
Lead Channel Impedance Value: 425 Ohm
Lead Channel Pacing Threshold Amplitude: 0.5 V
Lead Channel Pacing Threshold Amplitude: 0.5 V
Lead Channel Pacing Threshold Amplitude: 1 V
Lead Channel Pacing Threshold Amplitude: 1 V
Lead Channel Pacing Threshold Pulse Width: 0.5 ms
Lead Channel Pacing Threshold Pulse Width: 0.5 ms
Lead Channel Pacing Threshold Pulse Width: 0.5 ms
Lead Channel Pacing Threshold Pulse Width: 0.5 ms
Lead Channel Sensing Intrinsic Amplitude: 1 mV
Lead Channel Sensing Intrinsic Amplitude: 12 mV
Lead Channel Setting Pacing Amplitude: 2 V
Lead Channel Setting Pacing Amplitude: 2.5 V
Lead Channel Setting Pacing Pulse Width: 0.5 ms
Lead Channel Setting Sensing Sensitivity: 0.5 mV
Pulse Gen Serial Number: 7370130

## 2022-07-30 NOTE — Patient Instructions (Addendum)
Medication Instructions:  Your physician recommends that you continue on your current medications as directed. Please refer to the Current Medication list given to you today.  *If you need a refill on your cardiac medications before your next appointment, please call your pharmacy*   Lab Work: Today: Digoxin level & BMET If you have labs (blood work) drawn today and your tests are completely normal, you will receive your results only by: MyChart Message (if you have MyChart) OR A paper copy in the mail If you have any lab test that is abnormal or we need to change your treatment, we will call you to review the results.   Testing/Procedures: None ordered   Follow-Up: At [facility], you and your health needs are our priority.  As part of our continuing mission to provide you with exceptional heart care, we have created designated Provider Care Teams.  These Care Teams include your primary Cardiologist (physician) and Advanced Practice Providers (APPs -  Physician Assistants and Nurse Practitioners) who all work together to provide you with the care you need, when you need it.  Remote monitoring is used to monitor your Pacemaker or ICD from home. This monitoring reduces the number of office visits required to check your device to one time per year. It allows [place] to keep an eye on the functioning of your device to ensure it is working properly. You are scheduled for a device check from home on 08/21/2022. You may send your transmission at any time that day. If you have a wireless device, the transmission will be sent automatically. After your physician reviews your transmission, you will receive a postcard with your next transmission date.  Your next appointment:   1 year(s)  The format for your next appointment:   In Person  Provider:   Sherryl Manges, MD{   Thank you for choosing [facility]!!   [phone]

## 2022-07-30 NOTE — Progress Notes (Signed)
Patient Care Team: Richardean Chimera, MD as PCP - General (Family Medicine) Rollene Rotunda, MD as PCP - Cardiology (Cardiology)   HPI  [name] [name] is a 72 y.o. female Seen in followup for ICD St Jude  implanted for primary prevention in the setting of nonischemic heart disease + for LAMIN gene mutation.  June 2017 underwent device generator replacement. Takes Coumadin for stroke attributed to an LV clot COVID January 2021 and then vaccinated with rxn ( #1&2)       Had contralateral breast cancer 30 years or so after her first mastectomy.  Underwent mastectomy 2/23.    Dyspnea is mild and stable .  She went to the [place] with her family and rode on dolphins.  No chest pain.  No peripheral edema.   Date Cr K Dig Hgb  1/21 0.83 3.9     10/21 1.01 4.4 0.6 12.  2/23 0.82 4.1 0.4 10.9    DATE TEST EF   12/13 Echo   35-40 %   2/21 Echo   40-45 %   2/23 Echo  35-40%     Past Medical History:  Diagnosis Date   AICD (automatic cardioverter/defibrillator) present    Arthritis    thumbs   Breast cancer (HCC)    right   Cerebrovascular disease    s/p right MCA cardioemoblic infarct 1/61/09   CHF (congestive heart failure) (HCC)    Nonischemic cardiomyopathy (HCC)    a. +LMNA mutation b. s/p STJ dual chamber ICD   Presence of permanent cardiac pacemaker    Stroke (HCC) 02/2008   no deficits    Past Surgical History:  Procedure Laterality Date   ABDOMINAL HYSTERECTOMY     BREAST IMPLANT REMOVAL Right 05/31/2015   Procedure: REMOVAL RIGHT  BREAST IMPLANTS;  Surgeon: Etter Sjogren, MD;  Location: [facility] OR;  Service: Plastics;  Laterality: Right;   BREAST RECONSTRUCTION Right 1995   BREAST RECONSTRUCTION Right 05/31/2015   Procedure: RIGHT BREAST RECONSTRUCTION WITH SALINE IMPLANTS ;  Surgeon: Etter Sjogren, MD;  Location: [facility] OR;  Service: Plastics;  Laterality: Right;   CARDIAC DEFIBRILLATOR PLACEMENT  2010   STJ dual chamber ICD implanted for NICM by Dr Graciela Husbands    CARDIAC DEFIBRILLATOR PLACEMENT  05/26/2008   CATARACT EXTRACTION W/PHACO Right 03/19/2018   Procedure: CATARACT EXTRACTION PHACO AND INTRAOCULAR LENS PLACEMENT (IOC);  Surgeon: Fabio Pierce, MD;  Location: [facility] ORS;  Service: Ophthalmology;  Laterality: Right;  CDE: 5.42   CATARACT EXTRACTION W/PHACO Left 10/01/2018   Procedure: CATARACT EXTRACTION PHACO AND INTRAOCULAR LENS PLACEMENT (IOC);  Surgeon: Fabio Pierce, MD;  Location: [facility] ORS;  Service: Ophthalmology;  Laterality: Left;  left, CDE: 6.06   CHOLECYSTECTOMY     COLONOSCOPY     EP IMPLANTABLE DEVICE N/A 08/06/2015   Procedure: ICD Generator Changeout;  Surgeon: Duke Salvia, MD;  Location: [facility] INVASIVE CV LAB;  Service: Cardiovascular;  Laterality: N/A;   ESOPHAGOGASTRODUODENOSCOPY     MASTECTOMY Right    MASTECTOMY W/ SENTINEL NODE BIOPSY Left 04/05/2021   Procedure: LEFT MASTECTOMY;  Surgeon: Abigail Miyamoto, MD;  Location: [facility] OR;  Service: General;  Laterality: Left;   MASTOPEXY Left 05/31/2015   Procedure: LEFT BREAST MASTOPEXY;  Surgeon: Etter Sjogren, MD;  Location: [facility] OR;  Service: Plastics;  Laterality: Left;   PACEMAKER INSERTION  05/2008   SENTINEL NODE BIOPSY Left 04/05/2021   Procedure: SENTINEL LYMPH NODE BIOPSY;  Surgeon: Abigail Miyamoto, MD;  Location: [facility] OR;  Service: General;  Laterality: Left;   TONSILLECTOMY     VARICOSE VEIN SURGERY Left    leg    Current Outpatient Medications  Medication Sig Dispense Refill   anastrozole (ARIMIDEX) 1 MG tablet Take 1 mg by mouth daily.     ascorbic acid (VITAMIN C) 500 MG tablet Take 1 tablet (500 mg total) by mouth daily. 30 tablet 2   atorvastatin (LIPITOR) 20 MG tablet Take 1 tablet (20 mg total) by mouth daily. 30 tablet 2   BIOTIN PO Take 1 tablet by mouth daily.     digoxin (LANOXIN) 0.125 MG tablet TAKE ONE TABLET BY MOUTH DAILY. 90 tablet 3   empagliflozin (JARDIANCE) 10 MG TABS tablet TAKE 1 TABLET BY MOUTH DAILY BEFORE breakfast 30 tablet 1   furosemide (LASIX) 40  MG tablet Take 2 tablets (80 mg total) by mouth every other day. 90 tablet 3   metoprolol succinate (TOPROL-XL) 50 MG 24 hr tablet Take 1 tablet (50 mg total) by mouth 2 (two) times daily. Take with or immediately following a meal. 60 tablet 2   Multiple Vitamin (MULTIVITAMIN WITH MINERALS) TABS tablet Take 1 tablet by mouth daily.     Omega-3 Fatty Acids (FISH OIL PO) Take 1 capsule by mouth daily.      sacubitril-valsartan (ENTRESTO) 97-103 MG Take 1 tablet by mouth 2 (two) times daily. 180 tablet 3   spironolactone (ALDACTONE) 25 MG tablet Take 0.5 tablets (12.5 mg total) by mouth daily. 45 tablet 1   warfarin (COUMADIN) 5 MG tablet TAKE 1/2 TABLET TO 1 TABLET BY MOUTH DAILY AS DIRECTED BY COUMADIN CLINIC 60 tablet 0   No current facility-administered medications for this visit.    No Known Allergies  Review of Systems negative except from HPI and PMH  Physical Exam BP 100/64   Pulse (!) 57   Ht 5\' 8"  (1.727 m)   Wt 163 lb 6.4 oz (74.1 kg)   BMI 24.84 kg/m  Well developed and well nourished in no acute distress HENT normal Neck supple with JVP-flat Clear Device pocket well healed; without hematoma or erythema.  There is no tethering  Regular rate and rhythm, no   gallop No / murmur Abd-soft with active BS No Clubbing cyanosis edema Skin-warm and dry A & Oriented  Grossly normal sensory and motor function  ECG sinus rhythm competing with atrial pacing and left bundle branch block  Device function is normal. Programming changes none  See Paceart for details      Assessment and  Plan  DCM Lamin/Fam hx of + gene mutation Lamin Cardiomyopathy  Left bundle branch block  ICD    Congestive heart failure-chronic-systolic-class IIb-IIIa  High risk medication surveillance-Aldactone  VTNS  Orthostatic lightheadedness   Stroke attributed to LV clot, chronic Coumadin  Low hemoglobin 2/23  No intercurrent ventricular tachycardia.  Has had only nonsustained  VT.  Volume status and functional status is really quite good.  She went to the [place] in [place].  Continue digoxin Jardiance Entresto and Aldactone.  Will check a digoxin level as well as potassium level  With her left bundle branch block consideration for generator replacement would be for LV lead upgrade based on symptoms. Anticoagulation occasion with a stroke in the context of an LV clot.  I reached out to Dr.JH to consider switching her to a 10 a inhibitor    With a decreasing hemoglobin we will recheck a CBC

## 2022-07-31 LAB — BASIC METABOLIC PANEL
BUN: 30 mg/dL — ABNORMAL HIGH (ref 8–27)
CO2: 23 mmol/L (ref 20–29)
Calcium: 9.9 mg/dL (ref 8.7–10.3)
Glucose: 117 mg/dL — ABNORMAL HIGH (ref 70–99)

## 2022-07-31 LAB — DIGOXIN LEVEL

## 2022-07-31 LAB — CBC
Hematocrit: 37.4 % (ref 34.0–46.6)
Hemoglobin: 12.2 g/dL (ref 11.1–15.9)
WBC: 7.9 10*3/uL (ref 3.4–10.8)

## 2022-08-01 LAB — CBC
MCH: 29.1 pg (ref 26.6–33.0)
MCHC: 32.6 g/dL (ref 31.5–35.7)
MCV: 89 fL (ref 79–97)
Platelets: 180 10*3/uL (ref 150–450)
RBC: 4.19 x10E6/uL (ref 3.77–5.28)
RDW: 14.3 % (ref 11.7–15.4)

## 2022-08-01 LAB — BASIC METABOLIC PANEL
BUN/Creatinine Ratio: 29 — ABNORMAL HIGH (ref 12–28)
Chloride: 104 mmol/L (ref 96–106)
Creatinine, Ser: 1.02 mg/dL — ABNORMAL HIGH (ref 0.57–1.00)
Potassium: 4.2 mmol/L (ref 3.5–5.2)
Sodium: 141 mmol/L (ref 134–144)
eGFR: 59 mL/min/{1.73_m2} — ABNORMAL LOW (ref >59)

## 2022-08-08 ENCOUNTER — Telehealth: Payer: Self-pay

## 2022-08-08 MED ORDER — DIGOXIN 125 MCG PO TABS: 0.0625 mg | ORAL_TABLET | Freq: Every day | ORAL | 3 refills | Status: DC

## 2022-08-08 NOTE — Telephone Encounter (Signed)
Spoke with pt and advised of lab result and recommendation per Dr Graciela Husbands.  Pt verbalizes understanding and agrees with current plan.  Duke Salvia, MD 08/07/2022 11:40 AM EDT     Please Inform Patient    Labs Dig is 1.3  please have her hold for one week and then reduce her dig dose by 50 %

## 2022-08-11 ENCOUNTER — Ambulatory Visit: Payer: Medicare Other | Attending: Cardiology | Admitting: *Deleted

## 2022-08-11 DIAGNOSIS — Z5181 Encounter for therapeutic drug level monitoring: Secondary | ICD-10-CM | POA: Diagnosis not present

## 2022-08-11 DIAGNOSIS — I6350 Cerebral infarction due to unspecified occlusion or stenosis of unspecified cerebral artery: Secondary | ICD-10-CM | POA: Diagnosis not present

## 2022-08-11 LAB — POCT INR: INR: 2.6 (ref 2.0–3.0)

## 2022-08-11 NOTE — Patient Instructions (Signed)
Continue 1/2 tablet daily except 1 tablet on Mondays and Thursdays Recheck in 3 wks Pending Colonoscopy 7/30.  Needs Lovenox bridge

## 2022-08-13 DIAGNOSIS — N1831 Chronic kidney disease, stage 3a: Secondary | ICD-10-CM | POA: Diagnosis not present

## 2022-08-13 DIAGNOSIS — E7849 Other hyperlipidemia: Secondary | ICD-10-CM | POA: Diagnosis not present

## 2022-08-13 DIAGNOSIS — I10 Essential (primary) hypertension: Secondary | ICD-10-CM | POA: Diagnosis not present

## 2022-08-13 DIAGNOSIS — E1165 Type 2 diabetes mellitus with hyperglycemia: Secondary | ICD-10-CM | POA: Diagnosis not present

## 2022-08-13 DIAGNOSIS — E1122 Type 2 diabetes mellitus with diabetic chronic kidney disease: Secondary | ICD-10-CM | POA: Diagnosis not present

## 2022-08-13 DIAGNOSIS — E559 Vitamin D deficiency, unspecified: Secondary | ICD-10-CM | POA: Diagnosis not present

## 2022-08-13 DIAGNOSIS — E039 Hypothyroidism, unspecified: Secondary | ICD-10-CM | POA: Diagnosis not present

## 2022-08-15 ENCOUNTER — Other Ambulatory Visit (HOSPITAL_COMMUNITY): Payer: Medicare Other

## 2022-08-19 ENCOUNTER — Encounter (HOSPITAL_COMMUNITY): Payer: Self-pay

## 2022-08-19 ENCOUNTER — Ambulatory Visit (HOSPITAL_COMMUNITY): Admit: 2022-08-19 | Payer: Medicare Other | Admitting: Gastroenterology

## 2022-08-19 SURGERY — COLONOSCOPY WITH PROPOFOL
Anesthesia: Monitor Anesthesia Care

## 2022-08-20 DIAGNOSIS — Z23 Encounter for immunization: Secondary | ICD-10-CM | POA: Diagnosis not present

## 2022-08-20 DIAGNOSIS — E876 Hypokalemia: Secondary | ICD-10-CM | POA: Diagnosis not present

## 2022-08-20 DIAGNOSIS — R030 Elevated blood-pressure reading, without diagnosis of hypertension: Secondary | ICD-10-CM | POA: Diagnosis not present

## 2022-08-20 DIAGNOSIS — I5022 Chronic systolic (congestive) heart failure: Secondary | ICD-10-CM | POA: Diagnosis not present

## 2022-08-20 DIAGNOSIS — E1122 Type 2 diabetes mellitus with diabetic chronic kidney disease: Secondary | ICD-10-CM | POA: Diagnosis not present

## 2022-08-20 DIAGNOSIS — D6869 Other thrombophilia: Secondary | ICD-10-CM | POA: Diagnosis not present

## 2022-08-20 DIAGNOSIS — E559 Vitamin D deficiency, unspecified: Secondary | ICD-10-CM | POA: Diagnosis not present

## 2022-08-20 DIAGNOSIS — N1831 Chronic kidney disease, stage 3a: Secondary | ICD-10-CM | POA: Diagnosis not present

## 2022-08-20 DIAGNOSIS — C50919 Malignant neoplasm of unspecified site of unspecified female breast: Secondary | ICD-10-CM | POA: Diagnosis not present

## 2022-08-20 DIAGNOSIS — E7849 Other hyperlipidemia: Secondary | ICD-10-CM | POA: Diagnosis not present

## 2022-08-20 DIAGNOSIS — I420 Dilated cardiomyopathy: Secondary | ICD-10-CM | POA: Diagnosis not present

## 2022-08-21 ENCOUNTER — Ambulatory Visit: Payer: Medicare Other

## 2022-08-21 DIAGNOSIS — I420 Dilated cardiomyopathy: Secondary | ICD-10-CM

## 2022-08-21 LAB — CUP PACEART REMOTE DEVICE CHECK
Battery Remaining Longevity: 37 mo
Battery Remaining Percentage: 38 %
Battery Voltage: 2.89 V
Brady Statistic AP VP Percent: 1 %
Brady Statistic AP VS Percent: 12 %
Brady Statistic AS VP Percent: 1 %
Brady Statistic AS VS Percent: 87 %
Brady Statistic RA Percent Paced: 13 %
Brady Statistic RV Percent Paced: 1 %
Date Time Interrogation Session: 20240711040018
HighPow Impedance: 73 Ohm
HighPow Impedance: 73 Ohm
Implantable Lead Connection Status: 753985
Implantable Lead Connection Status: 753985
Implantable Lead Implant Date: 20100416
Implantable Lead Implant Date: 20100416
Implantable Lead Location: 753859
Implantable Lead Location: 753860
Implantable Lead Model: 7122
Implantable Pulse Generator Implant Date: 20170626
Lead Channel Impedance Value: 400 Ohm
Lead Channel Impedance Value: 400 Ohm
Lead Channel Pacing Threshold Amplitude: 0.5 V
Lead Channel Pacing Threshold Amplitude: 1 V
Lead Channel Pacing Threshold Pulse Width: 0.5 ms
Lead Channel Pacing Threshold Pulse Width: 0.5 ms
Lead Channel Sensing Intrinsic Amplitude: 0.7 mV
Lead Channel Sensing Intrinsic Amplitude: 11.7 mV
Lead Channel Setting Pacing Amplitude: 2 V
Lead Channel Setting Pacing Amplitude: 2.5 V
Lead Channel Setting Pacing Pulse Width: 0.5 ms
Lead Channel Setting Sensing Sensitivity: 0.5 mV
Pulse Gen Serial Number: 7370130

## 2022-09-01 ENCOUNTER — Ambulatory Visit: Payer: Medicare Other | Attending: Cardiology | Admitting: *Deleted

## 2022-09-01 DIAGNOSIS — I6350 Cerebral infarction due to unspecified occlusion or stenosis of unspecified cerebral artery: Secondary | ICD-10-CM

## 2022-09-01 DIAGNOSIS — Z5181 Encounter for therapeutic drug level monitoring: Secondary | ICD-10-CM | POA: Diagnosis not present

## 2022-09-01 LAB — POCT INR: INR: 3.1 — AB (ref 2.0–3.0)

## 2022-09-01 MED ORDER — ENOXAPARIN SODIUM 120 MG/0.8ML IJ SOSY: 120.0000 mg | PREFILLED_SYRINGE | INTRAMUSCULAR | 0 refills | Status: DC

## 2022-09-01 NOTE — Patient Instructions (Signed)
09/09/22  Colonoscopy  07/30/22  Labs:   Hgb 12.2  Hct 37.4  Plts 180  Scr 1.02  CrCl 59.18  Wt 74.1  7/24  Last dose of warfarin 7/25  No lovenox or warfarin 7/26 - 7/29  Lovenox 120mg  SQ at 8am 7/30  No Lovvenox in am ----------procedure-----------warfarin 5mg  in pm 7/31 - 8/1  Lovenox 120mg  sq @ 8am and warfarin 5mg  in pm 8/2 - 8/4 Lovenox 120mg  sq @ 8am and warfarin 2.5mg  in pm 8/5  Lovenox 120mg  sq @ 8am ------------INR check at 10am

## 2022-09-05 ENCOUNTER — Other Ambulatory Visit: Payer: Self-pay

## 2022-09-05 ENCOUNTER — Encounter (HOSPITAL_COMMUNITY): Payer: Self-pay

## 2022-09-05 ENCOUNTER — Encounter (HOSPITAL_COMMUNITY)
Admission: RE | Admit: 2022-09-05 | Discharge: 2022-09-05 | Disposition: A | Discharge status: Home / Self Care | Payer: Medicare Other | Source: Ambulatory Visit | Attending: Gastroenterology | Admitting: Gastroenterology

## 2022-09-05 HISTORY — DX: Prediabetes: R73.03

## 2022-09-08 ENCOUNTER — Other Ambulatory Visit: Payer: Self-pay | Admitting: Cardiology

## 2022-09-09 ENCOUNTER — Ambulatory Visit (HOSPITAL_COMMUNITY): Payer: Medicare Other | Admitting: Anesthesiology

## 2022-09-09 ENCOUNTER — Encounter (HOSPITAL_COMMUNITY)
Admission: RE | Disposition: A | Discharge status: Home / Self Care | Payer: Self-pay | Source: Home / Self Care | Attending: Gastroenterology

## 2022-09-09 ENCOUNTER — Ambulatory Visit (HOSPITAL_COMMUNITY)
Admission: RE | Admit: 2022-09-09 | Discharge: 2022-09-09 | Disposition: A | Discharge status: Home / Self Care | Payer: Medicare Other | Attending: Gastroenterology | Admitting: Gastroenterology

## 2022-09-09 ENCOUNTER — Encounter (INDEPENDENT_AMBULATORY_CARE_PROVIDER_SITE_OTHER): Payer: Self-pay | Admitting: *Deleted

## 2022-09-09 ENCOUNTER — Ambulatory Visit (HOSPITAL_BASED_OUTPATIENT_CLINIC_OR_DEPARTMENT_OTHER): Payer: Medicare Other | Admitting: Anesthesiology

## 2022-09-09 DIAGNOSIS — I4891 Unspecified atrial fibrillation: Secondary | ICD-10-CM

## 2022-09-09 DIAGNOSIS — Z95810 Presence of automatic (implantable) cardiac defibrillator: Secondary | ICD-10-CM | POA: Diagnosis not present

## 2022-09-09 DIAGNOSIS — K648 Other hemorrhoids: Secondary | ICD-10-CM

## 2022-09-09 DIAGNOSIS — D120 Benign neoplasm of cecum: Secondary | ICD-10-CM

## 2022-09-09 DIAGNOSIS — Z860100 Personal history of colon polyps, unspecified: Secondary | ICD-10-CM

## 2022-09-09 DIAGNOSIS — D125 Benign neoplasm of sigmoid colon: Secondary | ICD-10-CM | POA: Diagnosis not present

## 2022-09-09 DIAGNOSIS — D126 Benign neoplasm of colon, unspecified: Secondary | ICD-10-CM

## 2022-09-09 DIAGNOSIS — Z800 Family history of malignant neoplasm of digestive organs: Secondary | ICD-10-CM | POA: Diagnosis not present

## 2022-09-09 DIAGNOSIS — Z1211 Encounter for screening for malignant neoplasm of colon: Secondary | ICD-10-CM | POA: Insufficient documentation

## 2022-09-09 DIAGNOSIS — I5022 Chronic systolic (congestive) heart failure: Secondary | ICD-10-CM

## 2022-09-09 DIAGNOSIS — K635 Polyp of colon: Secondary | ICD-10-CM | POA: Diagnosis not present

## 2022-09-09 HISTORY — PX: SUBMUCOSAL INJECTION: SHX5543

## 2022-09-09 HISTORY — PX: COLONOSCOPY WITH PROPOFOL: SHX5780

## 2022-09-09 HISTORY — PX: POLYPECTOMY: SHX5525

## 2022-09-09 HISTORY — PX: BIOPSY: SHX5522

## 2022-09-09 HISTORY — PX: SUBMUCOSAL LIFTING INJECTION: SHX6855

## 2022-09-09 LAB — HM COLONOSCOPY

## 2022-09-09 SURGERY — COLONOSCOPY WITH PROPOFOL
Anesthesia: General

## 2022-09-09 MED ORDER — EPINEPHRINE 1 MG/10ML IJ SOSY
PREFILLED_SYRINGE | INTRAMUSCULAR | Status: AC
  Filled 2022-09-09: qty 10

## 2022-09-09 MED ORDER — PHENYLEPHRINE 80 MCG/ML (10ML) SYRINGE FOR IV PUSH (FOR BLOOD PRESSURE SUPPORT)
PREFILLED_SYRINGE | INTRAVENOUS | Status: AC
  Filled 2022-09-09: qty 10

## 2022-09-09 MED ORDER — SODIUM CHLORIDE (PF) 0.9 % IJ SOLN
PREFILLED_SYRINGE | INTRAMUSCULAR | Status: DC | PRN
  Administered 2022-09-09: 1 mL

## 2022-09-09 MED ORDER — LACTATED RINGERS IV SOLN: INTRAVENOUS | Status: DC

## 2022-09-09 MED ORDER — PROPOFOL 10 MG/ML IV BOLUS
INTRAVENOUS | Status: DC | PRN
Start: 2022-09-09 — End: 2022-09-09
  Administered 2022-09-09: 80 mg via INTRAVENOUS
  Administered 2022-09-09 (×2): 50 mg via INTRAVENOUS

## 2022-09-09 MED ORDER — EPHEDRINE 5 MG/ML INJ
INTRAVENOUS | Status: AC
  Filled 2022-09-09: qty 5

## 2022-09-09 MED ORDER — PHENYLEPHRINE 80 MCG/ML (10ML) SYRINGE FOR IV PUSH (FOR BLOOD PRESSURE SUPPORT)
PREFILLED_SYRINGE | INTRAVENOUS | Status: DC | PRN
Start: 2022-09-09 — End: 2022-09-09
  Administered 2022-09-09 (×3): 160 ug via INTRAVENOUS
  Administered 2022-09-09 (×4): 80 ug via INTRAVENOUS

## 2022-09-09 MED ORDER — PROPOFOL 500 MG/50ML IV EMUL
INTRAVENOUS | Status: DC | PRN
  Administered 2022-09-09: 150 ug/kg/min via INTRAVENOUS

## 2022-09-09 MED ORDER — EPHEDRINE SULFATE (PRESSORS) 50 MG/ML IJ SOLN
INTRAMUSCULAR | Status: DC | PRN
  Administered 2022-09-09 (×3): 5 mg via INTRAVENOUS

## 2022-09-09 MED ORDER — LIDOCAINE HCL (PF) 2 % IJ SOLN
INTRAMUSCULAR | Status: AC
  Filled 2022-09-09: qty 5

## 2022-09-09 MED ORDER — LIDOCAINE HCL (CARDIAC) PF 100 MG/5ML IV SOSY
PREFILLED_SYRINGE | INTRAVENOUS | Status: DC | PRN
  Administered 2022-09-09: 80 mg via INTRAVENOUS

## 2022-09-09 NOTE — Transfer of Care (Signed)
Immediate Anesthesia Transfer of Care Note  Patient: [name] [name]  Procedure(s) Performed: COLONOSCOPY WITH PROPOFOL POLYPECTOMY SUBMUCOSAL LIFTING INJECTION SUBMUCOSAL INJECTION BIOPSY  Patient Location: Short Stay  Anesthesia Type:General  Level of Consciousness: awake  Airway & Oxygen Therapy: Patient Spontanous Breathing  Post-op Assessment: Report given to RN and Post -op Vital signs reviewed and stable  Post vital signs: Reviewed and stable  Last Vitals:  Vitals Value Taken Time  BP 84/40 09/09/22 1543  Temp 36.4 C 09/09/22 1543  Pulse 72 09/09/22 1543  Resp 16 09/09/22 1543  SpO2 98 % 09/09/22 1543    Last Pain:  Vitals:   09/09/22 1543  TempSrc: Oral  PainSc: 0-No pain      Patients Stated Pain Goal: 5 (09/09/22 1240)  Complications: No notable events documented.

## 2022-09-09 NOTE — Op Note (Addendum)
[facility] Patient Name: [name] [name] Procedure Date: 09/09/2022 2:30 PM MRN: [id number] Date of Birth: 29-May-1950 Attending MD: Katrinka Blazing , , [id number] CSN: [id number] Age: 72 Admit Type: Outpatient Procedure:                Colonoscopy Indications:              Surveillance: Personal history of colonic polyps                            (unknown histology) on last colonoscopy 5 years ago Providers:                Katrinka Blazing, Angelica Ran, Dyann Ruddle Referring MD:              Medicines:                Monitored Anesthesia Care Complications:            No immediate complications. Estimated Blood Loss:     Estimated blood loss: none. Procedure:                Pre-Anesthesia Assessment:                           - Prior to the procedure, a History and Physical                            was performed, and patient medications, allergies                            and sensitivities were reviewed. The patient's                            tolerance of previous anesthesia was reviewed.                           - The risks and benefits of the procedure and the                            sedation options and risks were discussed with the                            patient. All questions were answered and informed                            consent was obtained.                           - ASA Grade Assessment: III - A patient with severe                            systemic disease.                           After obtaining informed consent, the colonoscope                            was passed under  direct vision. Throughout the                            procedure, the patient's blood pressure, pulse, and                            oxygen saturations were monitored continuously. The                            [id number]-HQ190L ([id number]) scope was introduced through                            the anus and advanced to the the cecum, identified                            by  appendiceal orifice and ileocecal valve. The                            colonoscopy was performed without difficulty. The                            patient tolerated the procedure well. The quality                            of the bowel preparation was good. Scope In: 2:42:11 PM Scope Out: 3:37:08 PM Scope Withdrawal Time: 0 hours 43 minutes 1 second  Total Procedure Duration: 0 hours 54 minutes 57 seconds  Findings:      The perianal and digital rectal examinations were normal.      A 20 to 25 mm polyp was found at the right lateral side of the ileocecal       valve. The polyp was Paris classification IIa (superficial, elevated)       with a lobulated surface. Area was successfully injected with 5 mL       [place] for a lift polypectomy. Imaging was performed using white light       and narrow band imaging to visualize the mucosa and demarcate the polyp       site after injection for EMR purposes. The polyp was removed with a       piecemeal technique using a hot snare. Resection and retrieval were       complete. Area was successfully injected with 1 mL of a 1:100,000       solution of epinephrine for hemostasis of bleeding caused by the       procedure. To prevent bleeding after the polypectomy, four hemostatic       clips were successfully placed. Clip manufacturer: [employer].       There was no bleeding at the end of the procedure.      Two sessile polyps were found in the ascending colon and cecum. The       polyps were 2 to 5 mm in size. These polyps were removed with a cold       snare. Resection and retrieval were complete.      A 4 mm polyp was found in the sigmoid colon. The polyp was sessile. The       polyp was removed with a cold snare.  Resection and retrieval were       complete.      Non-bleeding internal hemorrhoids were found during retroflexion. The       hemorrhoids were small. Impression:               - One 20 to 25 mm polyp at the ileocecal valve,                             removed piecemeal using a hot snare. Resected and                            retrieved. Injected. Clips were placed. Clip                            manufacturer: [employer].                           - Two 2 to 5 mm polyps in the ascending colon and                            in the cecum, removed with a cold snare. Resected                            and retrieved.                           - One 4 mm polyp in the sigmoid colon, removed with                            a cold snare. Resected and retrieved.                           - Non-bleeding internal hemorrhoids. Moderate Sedation:      Per Anesthesia Care Recommendation:           - Discharge patient to home (ambulatory).                           - Resume previous diet.                           - Await pathology results.                           - If non malignant pathology, repeat colonoscopy in                            9 months for surveillance after piecemeal                            polypectomy.                           - Restart coumadin in 5 days. Will need to do  Lovenox bridging for now and monitor signs of                            bleeding. Procedure Code(s):        --- Professional ---                           [phone], 59, Colonoscopy, flexible; with removal of                            tumor(s), polyp(s), or other lesion(s) by snare                            technique                           45381, Colonoscopy, flexible; with directed                            submucosal injection(s), any substance Diagnosis Code(s):        --- Professional ---                           Z86.010, Personal history of colonic polyps                           D12.0, Benign neoplasm of cecum                           D12.5, Benign neoplasm of sigmoid colon                           D12.2, Benign neoplasm of ascending colon                           K64.8, Other  hemorrhoids CPT copyright 2022 American Medical Association. All rights reserved. The codes documented in this report are preliminary and upon coder review may  be revised to meet current compliance requirements. Katrinka Blazing, MD Katrinka Blazing,  09/09/2022 3:45:53 PM This report has been signed electronically. Number of Addenda: 0

## 2022-09-09 NOTE — Anesthesia Preprocedure Evaluation (Signed)
Anesthesia Evaluation  Patient identified by MRN, date of birth, ID band Patient awake    Reviewed: Allergy & Precautions, H&P , NPO status , Patient's Chart, lab work & pertinent test results, reviewed documented beta blocker date and time   Airway Mallampati: II  TM Distance: >3 FB Neck ROM: full    Dental no notable dental hx.    Pulmonary neg pulmonary ROS, pneumonia   Pulmonary exam normal breath sounds clear to auscultation       Cardiovascular Exercise Tolerance: Good +CHF  + dysrhythmias Atrial Fibrillation + pacemaker + Cardiac Defibrillator  Rhythm:regular Rate:Normal     Neuro/Psych CVA negative neurological ROS  negative psych ROS   GI/Hepatic negative GI ROS, Neg liver ROS,,,  Endo/Other  negative endocrine ROS    Renal/GU negative Renal ROS  negative genitourinary   Musculoskeletal   Abdominal   Peds  Hematology negative hematology ROS (+)   Anesthesia Other Findings   Reproductive/Obstetrics negative OB ROS                             Anesthesia Physical Anesthesia Plan  ASA: 3  Anesthesia Plan: General   Post-op Pain Management:    Induction:   PONV Risk Score and Plan: Propofol infusion  Airway Management Planned:   Additional Equipment:   Intra-op Plan:   Post-operative Plan:   Informed Consent: I have reviewed the patients History and Physical, chart, labs and discussed the procedure including the risks, benefits and alternatives for the proposed anesthesia with the patient or authorized representative who has indicated his/her understanding and acceptance.     Dental Advisory Given  Plan Discussed with: CRNA  Anesthesia Plan Comments:        Anesthesia Quick Evaluation

## 2022-09-09 NOTE — Discharge Instructions (Signed)
You are being discharged to home.  Resume your previous diet.  We are waiting for your pathology results.  Your physician has recommended a repeat colonoscopy in nine months for surveillance after today's piecemeal polypectomy, If non malignant pathology. Restart coumadin in 5 days.

## 2022-09-09 NOTE — H&P (Signed)
[name] [name] is an 72 y.o. female.   Chief Complaint: history colon polyps HPI: 72 y/o F with PMH HF s/p ICD placement, arthritis, breast cancer, stroke, coming for history of colon polyps.  Last colonoscopy performed in 2019, had at least 5 polyps removed but no pathology is available. The patient denies having any complaints such as melena, hematochezia, abdominal pain or distention, change in her bowel movement consistency or frequency, no changes in weight recently.  Brother was diagnosed with colon cancer in his 35s.   Past Medical History:  Diagnosis Date   AICD (automatic cardioverter/defibrillator) present    Arthritis    thumbs   Breast cancer (HCC)    right   Cerebrovascular disease    s/p right MCA cardioemoblic infarct 03/11/84   CHF (congestive heart failure) (HCC)    Nonischemic cardiomyopathy (HCC)    a. +LMNA mutation b. s/p STJ dual chamber ICD   Pre-diabetes    Presence of permanent cardiac pacemaker    Stroke (HCC) 02/2008   no deficits    Past Surgical History:  Procedure Laterality Date   ABDOMINAL HYSTERECTOMY     BREAST IMPLANT REMOVAL Right 05/31/2015   Procedure: REMOVAL RIGHT  BREAST IMPLANTS;  Surgeon: Etter Sjogren, MD;  Location: [facility] OR;  Service: Plastics;  Laterality: Right;   BREAST RECONSTRUCTION Right 1995   BREAST RECONSTRUCTION Right 05/31/2015   Procedure: RIGHT BREAST RECONSTRUCTION WITH SALINE IMPLANTS ;  Surgeon: Etter Sjogren, MD;  Location: [facility] OR;  Service: Plastics;  Laterality: Right;   CARDIAC DEFIBRILLATOR PLACEMENT  2010   STJ dual chamber ICD implanted for NICM by Dr Graciela Husbands   CARDIAC DEFIBRILLATOR PLACEMENT  05/26/2008   CATARACT EXTRACTION W/PHACO Right 03/19/2018   Procedure: CATARACT EXTRACTION PHACO AND INTRAOCULAR LENS PLACEMENT (IOC);  Surgeon: Fabio Pierce, MD;  Location: [facility] ORS;  Service: Ophthalmology;  Laterality: Right;  CDE: 5.42   CATARACT EXTRACTION W/PHACO Left 10/01/2018   Procedure: CATARACT EXTRACTION PHACO AND  INTRAOCULAR LENS PLACEMENT (IOC);  Surgeon: Fabio Pierce, MD;  Location: [facility] ORS;  Service: Ophthalmology;  Laterality: Left;  left, CDE: 6.06   CHOLECYSTECTOMY     COLONOSCOPY     EP IMPLANTABLE DEVICE N/A 08/06/2015   Procedure: ICD Generator Changeout;  Surgeon: Duke Salvia, MD;  Location: [facility] INVASIVE CV LAB;  Service: Cardiovascular;  Laterality: N/A;   ESOPHAGOGASTRODUODENOSCOPY     MASTECTOMY Right    MASTECTOMY W/ SENTINEL NODE BIOPSY Left 04/05/2021   Procedure: LEFT MASTECTOMY;  Surgeon: Abigail Miyamoto, MD;  Location: [facility] OR;  Service: General;  Laterality: Left;   MASTOPEXY Left 05/31/2015   Procedure: LEFT BREAST MASTOPEXY;  Surgeon: Etter Sjogren, MD;  Location: [facility] OR;  Service: Plastics;  Laterality: Left;   PACEMAKER INSERTION  05/2008   SENTINEL NODE BIOPSY Left 04/05/2021   Procedure: SENTINEL LYMPH NODE BIOPSY;  Surgeon: Abigail Miyamoto, MD;  Location: [facility] OR;  Service: General;  Laterality: Left;   TONSILLECTOMY     VARICOSE VEIN SURGERY Left    leg    Family History  Problem Relation Age of Onset   Cardiomyopathy Other        family hx of   Other Brother        s/p heart transplant   Stroke Other        family hx of   Social History:  reports that she has never smoked. She has never used smokeless tobacco. She reports that she does not drink alcohol and does not use  drugs.  Allergies: No Known Allergies  Medications Prior to Admission  Medication Sig Dispense Refill   anastrozole (ARIMIDEX) 1 MG tablet Take 1 mg by mouth in the morning.     ascorbic acid (VITAMIN C) 500 MG tablet Take 1 tablet (500 mg total) by mouth daily. 30 tablet 2   atorvastatin (LIPITOR) 20 MG tablet Take 1 tablet (20 mg total) by mouth daily. (Patient taking differently: Take 20 mg by mouth every evening.) 30 tablet 2   BIOTIN PO Take 1 tablet by mouth daily.     digoxin (LANOXIN) 0.125 MG tablet Take 0.5 tablets (0.0625 mg total) by mouth daily. 45 tablet 3   enoxaparin (LOVENOX)  120 MG/0.8ML injection Inject 0.8 mLs (120 mg total) into the skin daily for 10 days. 8 mL 0   furosemide (LASIX) 40 MG tablet Take 2 tablets (80 mg total) by mouth every other day. (Patient taking differently: Take 60 mg by mouth daily as needed (fluid retention (typically 5x's a week)).) 90 tablet 3   metoprolol succinate (TOPROL-XL) 50 MG 24 hr tablet Take 1 tablet (50 mg total) by mouth 2 (two) times daily. Take with or immediately following a meal. 60 tablet 2   Multiple Vitamin (MULTIVITAMIN WITH MINERALS) TABS tablet Take 1 tablet by mouth in the morning.     Omega-3 Fatty Acids (FISH OIL PO) Take 1 capsule by mouth in the morning.     polyethylene glycol-electrolytes (NULYTELY) 420 g solution Take 4,000 mLs by mouth once.     sacubitril-valsartan (ENTRESTO) 97-103 MG Take 1 tablet by mouth 2 (two) times daily. 180 tablet 3   spironolactone (ALDACTONE) 25 MG tablet Take 0.5 tablets (12.5 mg total) by mouth daily. 45 tablet 1   Vitamin D, Ergocalciferol, (DRISDOL) 1.25 MG (50000 UNIT) CAPS capsule Take 50,000 Units by mouth every Wednesday.     warfarin (COUMADIN) 5 MG tablet TAKE 1/2 TABLET TO 1 TABLET BY MOUTH DAILY AS DIRECTED BY COUMADIN CLINIC (Patient taking differently: Take 2.5-5 mg by mouth See admin instructions. Take 1 tablet (5 mg) by mouth on Mondays & Thursdays.  Take 0.5 tablet (2.5 mg) by mouth on Sundays, Tuesdays, Wednesdays, Fridays & Saturdays.) 60 tablet 0   JARDIANCE 10 MG TABS tablet TAKE 1 TABLET BY MOUTH DAILY BEFORE breakfast 30 tablet 1    No results found for this or any previous visit (from the past 48 hour(s)). No results found.  Review of Systems  All other systems reviewed and are negative.   Blood pressure (!) 99/52, pulse 69, temperature (!) 87.8 F (31 C), temperature source Oral, resp. rate 16, SpO2 98%. Physical Exam  GENERAL: The patient is AO x3, in no acute distress. HEENT: Head is normocephalic and atraumatic. EOMI are intact. Mouth is well  hydrated and without lesions. NECK: Supple. No masses LUNGS: Clear to auscultation. No presence of rhonchi/wheezing/rales. Adequate chest expansion HEART: RRR, normal s1 and s2. ABDOMEN: Soft, nontender, no guarding, no peritoneal signs, and nondistended. BS +. No masses. EXTREMITIES: Without any cyanosis, clubbing, rash, lesions or edema. NEUROLOGIC: AOx3, no focal motor deficit. SKIN: no jaundice, no rashes  Assessment/Plan 71 y/o F with PMH HF s/p ICD placement, arthritis, breast cancer, stroke, coming for history of colon polyps.  Will proceed with colonoscopy.  Dolores Frame, MD 09/09/2022, 1:12 PM

## 2022-09-09 NOTE — Progress Notes (Signed)
Remote ICD transmission.   

## 2022-09-11 ENCOUNTER — Encounter (INDEPENDENT_AMBULATORY_CARE_PROVIDER_SITE_OTHER): Payer: Self-pay | Admitting: *Deleted

## 2022-09-12 ENCOUNTER — Encounter (HOSPITAL_COMMUNITY): Payer: Self-pay | Admitting: Gastroenterology

## 2022-09-12 NOTE — Anesthesia Postprocedure Evaluation (Signed)
Anesthesia Post Note  Patient: [name] [name]  Procedure(s) Performed: COLONOSCOPY WITH PROPOFOL POLYPECTOMY SUBMUCOSAL LIFTING INJECTION SUBMUCOSAL INJECTION BIOPSY  Patient location during evaluation: Phase II Anesthesia Type: General Level of consciousness: awake Pain management: pain level controlled Vital Signs Assessment: post-procedure vital signs reviewed and stable Respiratory status: spontaneous breathing and respiratory function stable Cardiovascular status: blood pressure returned to baseline and stable Postop Assessment: no headache and no apparent nausea or vomiting Anesthetic complications: no Comments: Late entry   No notable events documented.   Last Vitals:  Vitals:   09/09/22 1543 09/09/22 1553  BP: (!) 84/40 (!) 94/51  Pulse: 72   Resp: 16   Temp: (!) 36.4 C   SpO2: 98%     Last Pain:  Vitals:   09/10/22 1023  TempSrc:   PainSc: 0-No pain                 Windell Norfolk

## 2022-09-15 ENCOUNTER — Ambulatory Visit: Payer: Medicare Other | Attending: Cardiology | Admitting: *Deleted

## 2022-09-15 DIAGNOSIS — I6350 Cerebral infarction due to unspecified occlusion or stenosis of unspecified cerebral artery: Secondary | ICD-10-CM | POA: Diagnosis not present

## 2022-09-15 DIAGNOSIS — Z5181 Encounter for therapeutic drug level monitoring: Secondary | ICD-10-CM

## 2022-09-15 LAB — POCT INR: INR: 1 — AB (ref 2.0–3.0)

## 2022-09-15 MED ORDER — ENOXAPARIN SODIUM 120 MG/0.8ML IJ SOSY: 120.0000 mg | PREFILLED_SYRINGE | INTRAMUSCULAR | 0 refills | Status: DC

## 2022-09-15 NOTE — Patient Instructions (Signed)
S/P colonoscopy with removal of 4 polyps.  Was told by MD to hold warfarin 5 days after procedure. Restarted warfarin last night 09/14/22 Take warfarin 1 1/2 tablets tonight, 1 tablet tomorrow night then resume 1/2 tablet daily except 1 tablet on Mondays and Thursdays Continue Lovenox 120mg  sq daily Recheck Monday 8/12

## 2022-09-16 ENCOUNTER — Other Ambulatory Visit: Payer: Self-pay | Admitting: Cardiology

## 2022-09-22 ENCOUNTER — Ambulatory Visit: Payer: Medicare Other | Attending: Internal Medicine | Admitting: *Deleted

## 2022-09-22 DIAGNOSIS — Z5181 Encounter for therapeutic drug level monitoring: Secondary | ICD-10-CM

## 2022-09-22 DIAGNOSIS — I6350 Cerebral infarction due to unspecified occlusion or stenosis of unspecified cerebral artery: Secondary | ICD-10-CM

## 2022-09-22 LAB — POCT INR: INR: 1.4 — AB (ref 2.0–3.0)

## 2022-09-22 NOTE — Patient Instructions (Signed)
Take warfarin 1 1/2 tablets tonight and tomorrow night and recheck on Wednesday Continue Lovenox 120mg  sq daily Recheck Thursdays 8/14

## 2022-09-24 ENCOUNTER — Ambulatory Visit: Payer: Medicare Other | Attending: Cardiology | Admitting: *Deleted

## 2022-09-24 DIAGNOSIS — I6350 Cerebral infarction due to unspecified occlusion or stenosis of unspecified cerebral artery: Secondary | ICD-10-CM

## 2022-09-24 DIAGNOSIS — Z5181 Encounter for therapeutic drug level monitoring: Secondary | ICD-10-CM

## 2022-09-24 LAB — POCT INR: INR: 2.1 (ref 2.0–3.0)

## 2022-09-24 NOTE — Patient Instructions (Signed)
Restart warfarin 1/2 tablet daily except 1 tablet on Mondays and Thursdays Stop Lovenox Recheck in 3 wks

## 2022-10-11 ENCOUNTER — Other Ambulatory Visit: Payer: Self-pay | Admitting: Internal Medicine

## 2022-10-11 DIAGNOSIS — I5022 Chronic systolic (congestive) heart failure: Secondary | ICD-10-CM

## 2022-10-16 ENCOUNTER — Ambulatory Visit: Payer: Medicare Other | Attending: Internal Medicine | Admitting: *Deleted

## 2022-10-16 DIAGNOSIS — Z5181 Encounter for therapeutic drug level monitoring: Secondary | ICD-10-CM | POA: Diagnosis not present

## 2022-10-16 DIAGNOSIS — I6350 Cerebral infarction due to unspecified occlusion or stenosis of unspecified cerebral artery: Secondary | ICD-10-CM

## 2022-10-16 LAB — POCT INR: INR: 2 (ref 2.0–3.0)

## 2022-10-16 NOTE — Patient Instructions (Signed)
Continue warfarin 1/2 tablet daily except 1 tablet on Mondays and Thursdays Recheck in 4 wks 

## 2022-10-23 DIAGNOSIS — Z23 Encounter for immunization: Secondary | ICD-10-CM | POA: Diagnosis not present

## 2022-10-23 DIAGNOSIS — Z6824 Body mass index (BMI) 24.0-24.9, adult: Secondary | ICD-10-CM | POA: Diagnosis not present

## 2022-10-23 DIAGNOSIS — I5022 Chronic systolic (congestive) heart failure: Secondary | ICD-10-CM | POA: Diagnosis not present

## 2022-10-23 DIAGNOSIS — E876 Hypokalemia: Secondary | ICD-10-CM | POA: Diagnosis not present

## 2022-10-23 DIAGNOSIS — D6869 Other thrombophilia: Secondary | ICD-10-CM | POA: Diagnosis not present

## 2022-10-23 DIAGNOSIS — E1122 Type 2 diabetes mellitus with diabetic chronic kidney disease: Secondary | ICD-10-CM | POA: Diagnosis not present

## 2022-10-23 DIAGNOSIS — R030 Elevated blood-pressure reading, without diagnosis of hypertension: Secondary | ICD-10-CM | POA: Diagnosis not present

## 2022-10-23 DIAGNOSIS — N1831 Chronic kidney disease, stage 3a: Secondary | ICD-10-CM | POA: Diagnosis not present

## 2022-10-23 DIAGNOSIS — E7849 Other hyperlipidemia: Secondary | ICD-10-CM | POA: Diagnosis not present

## 2022-10-23 DIAGNOSIS — E559 Vitamin D deficiency, unspecified: Secondary | ICD-10-CM | POA: Diagnosis not present

## 2022-10-23 DIAGNOSIS — I420 Dilated cardiomyopathy: Secondary | ICD-10-CM | POA: Diagnosis not present

## 2022-11-11 ENCOUNTER — Other Ambulatory Visit: Payer: Self-pay | Admitting: Cardiology

## 2022-11-13 ENCOUNTER — Ambulatory Visit: Payer: Medicare Other | Attending: Internal Medicine | Admitting: *Deleted

## 2022-11-13 DIAGNOSIS — Z5181 Encounter for therapeutic drug level monitoring: Secondary | ICD-10-CM | POA: Diagnosis not present

## 2022-11-13 DIAGNOSIS — I6350 Cerebral infarction due to unspecified occlusion or stenosis of unspecified cerebral artery: Secondary | ICD-10-CM | POA: Diagnosis not present

## 2022-11-13 LAB — POCT INR: INR: 2 (ref 2.0–3.0)

## 2022-11-13 NOTE — Patient Instructions (Signed)
Continue warfarin 1/2 tablet daily except 1 tablet on Mondays and Thursdays Recheck in 6 wks

## 2022-11-20 ENCOUNTER — Ambulatory Visit (INDEPENDENT_AMBULATORY_CARE_PROVIDER_SITE_OTHER): Payer: Medicare Other

## 2022-11-20 DIAGNOSIS — I420 Dilated cardiomyopathy: Secondary | ICD-10-CM | POA: Diagnosis not present

## 2022-11-20 LAB — CUP PACEART REMOTE DEVICE CHECK
Battery Remaining Longevity: 35 mo
Battery Remaining Percentage: 37 %
Battery Voltage: 2.89 V
Brady Statistic AP VP Percent: 1 %
Brady Statistic AP VS Percent: 14 %
Brady Statistic AS VP Percent: 1 %
Brady Statistic AS VS Percent: 86 %
Brady Statistic RA Percent Paced: 14 %
Brady Statistic RV Percent Paced: 1 %
Date Time Interrogation Session: 20241010040018
HighPow Impedance: 72 Ohm
HighPow Impedance: 72 Ohm
Implantable Lead Connection Status: 753985
Implantable Lead Connection Status: 753985
Implantable Lead Implant Date: 20100416
Implantable Lead Implant Date: 20100416
Implantable Lead Location: 753859
Implantable Lead Location: 753860
Implantable Lead Model: 7122
Implantable Pulse Generator Implant Date: 20170626
Lead Channel Impedance Value: 390 Ohm
Lead Channel Impedance Value: 400 Ohm
Lead Channel Pacing Threshold Amplitude: 0.5 V
Lead Channel Pacing Threshold Amplitude: 1 V
Lead Channel Pacing Threshold Pulse Width: 0.5 ms
Lead Channel Pacing Threshold Pulse Width: 0.5 ms
Lead Channel Sensing Intrinsic Amplitude: 0.5 mV
Lead Channel Sensing Intrinsic Amplitude: 11.7 mV
Lead Channel Setting Pacing Amplitude: 2 V
Lead Channel Setting Pacing Amplitude: 2.5 V
Lead Channel Setting Pacing Pulse Width: 0.5 ms
Lead Channel Setting Sensing Sensitivity: 0.5 mV
Pulse Gen Serial Number: 7370130

## 2022-12-03 NOTE — Progress Notes (Signed)
Remote ICD transmission.   

## 2022-12-11 DIAGNOSIS — E039 Hypothyroidism, unspecified: Secondary | ICD-10-CM | POA: Diagnosis not present

## 2022-12-11 DIAGNOSIS — E559 Vitamin D deficiency, unspecified: Secondary | ICD-10-CM | POA: Diagnosis not present

## 2022-12-11 DIAGNOSIS — I5022 Chronic systolic (congestive) heart failure: Secondary | ICD-10-CM | POA: Diagnosis not present

## 2022-12-11 DIAGNOSIS — E782 Mixed hyperlipidemia: Secondary | ICD-10-CM | POA: Diagnosis not present

## 2022-12-11 DIAGNOSIS — E1122 Type 2 diabetes mellitus with diabetic chronic kidney disease: Secondary | ICD-10-CM | POA: Diagnosis not present

## 2022-12-21 NOTE — Progress Notes (Unsigned)
  Cardiology Office Note:   Date:  12/21/2022  ID:  [name] [name], DOB Feb 23, 1950, MRN [id number] PCP: Richardean Chimera, MD  [facility] Providers Cardiologist:  Rollene Rotunda, MD Electrophysiologist:  Sherryl Manges, MD {  History of Present Illness:   [name] [name] is a 72 y.o. female who presents for follow up of cardiomyopathy.  She has a history of a nonischemic cardiomyopathy. Her ejection fraction was at one point in time 15 - 20%. A later ejection fraction was about 40% in 2015.   Most recently the EF was 25% again.   I sent her for a CPX in 2018 which demonstrated a good VO2 Max.    ***   She saw Dr. Graciela Husbands and she reported increased DOE in June of 2023. Marland Kitchen  He suggested that they could consider CRT upgrade.  She opted not to do that at that time.    She has been doing well since I last saw her.  She does get some dizziness mildly.  She has blood pressures that sometimes go into the 80s systolic.  She denies any presyncope or syncope.  She denies any new shortness of breath, PND or orthopnea.  She has had no weight gain or edema.    ROS: ***  Studies Reviewed:    EKG:       ***  Risk Assessment/Calculations:   {Does this patient have ATRIAL FIBRILLATION?:[phone]} No BP recorded.  {Refresh Note OR Click here to enter BP  :1}***        Physical Exam:   VS:  There were no vitals taken for this visit.   Wt Readings from Last 3 Encounters:  09/05/22 163 lb 5.8 oz (74.1 kg)  07/30/22 163 lb 6.4 oz (74.1 kg)  06/20/22 161 lb 9.6 oz (73.3 kg)     GEN: Well nourished, well developed in no acute distress NECK: No JVD; No carotid bruits CARDIAC: ***RR, *** murmurs, rubs, gallops RESPIRATORY:  Clear to auscultation without rales, wheezing or rhonchi  ABDOMEN: Soft, non-tender, non-distended EXTREMITIES:  No edema; No deformity   ASSESSMENT AND PLAN:   CARDIOMYOPATHY, PRIMARY, DILATED: ***  We talked about possibly reducing her meds but she would prefer to stay  on the current dose despite the low blood pressure she is not particularly symptomatic.  She does not want to consider BiV upgrade at this point.  ICD :   She is up to date with follow up and had normal device function in October 2024.  I reviewed these records for this visit.   ***  she is up-to-date with follow-up and I did check the last device interrogation.        Follow up ***  Signed, Rollene Rotunda, MD

## 2022-12-22 ENCOUNTER — Ambulatory Visit: Payer: Self-pay | Admitting: *Deleted

## 2022-12-22 ENCOUNTER — Ambulatory Visit: Payer: Medicare Other | Attending: Cardiology | Admitting: Cardiology

## 2022-12-22 ENCOUNTER — Encounter: Payer: Self-pay | Admitting: Cardiology

## 2022-12-22 VITALS — BP 98/58 | HR 60 | Ht 68.0 in | Wt 157.8 lb

## 2022-12-22 DIAGNOSIS — Z95810 Presence of automatic (implantable) cardiac defibrillator: Secondary | ICD-10-CM

## 2022-12-22 DIAGNOSIS — I420 Dilated cardiomyopathy: Secondary | ICD-10-CM

## 2022-12-22 MED ORDER — RIVAROXABAN 15 MG PO TABS: 15.0000 mg | ORAL_TABLET | Freq: Every day | ORAL | 11 refills | Status: DC

## 2022-12-22 NOTE — Patient Instructions (Signed)
Medication Instructions:  STOP WARFARIN-  AFTER BEING OFF OF WARFARIN FOR 3 DAYS: START XARELTO 15 MG EVERY EVENING *If you need a refill on your cardiac medications before your next appointment, please call your pharmacy*   Follow-Up: At [facility], you and your health needs are our priority.  As part of our continuing mission to provide you with exceptional heart care, we have created designated Provider Care Teams.  These Care Teams include your primary Cardiologist (physician) and Advanced Practice Providers (APPs -  Physician Assistants and Nurse Practitioners) who all work together to provide you with the care you need, when you need it.  We recommend signing up for the patient portal called "MyChart".  Sign up information is provided on this After Visit Summary.  MyChart is used to connect with patients for Virtual Visits (Telemedicine).  Patients are able to view lab/test results, encounter notes, upcoming appointments, etc.  Non-urgent messages can be sent to your provider as well.   To learn more about what you can do with MyChart, go to [email or web address].    Your next appointment:   6 month(s)  Provider:   Rollene Rotunda, MD

## 2022-12-25 ENCOUNTER — Other Ambulatory Visit: Payer: Self-pay | Admitting: Cardiology

## 2022-12-25 MED ORDER — EMPAGLIFLOZIN 10 MG PO TABS: 10.0000 mg | ORAL_TABLET | Freq: Every day | ORAL | 3 refills | Status: DC

## 2023-01-01 DIAGNOSIS — E876 Hypokalemia: Secondary | ICD-10-CM | POA: Diagnosis not present

## 2023-01-01 DIAGNOSIS — E782 Mixed hyperlipidemia: Secondary | ICD-10-CM | POA: Diagnosis not present

## 2023-01-01 DIAGNOSIS — E559 Vitamin D deficiency, unspecified: Secondary | ICD-10-CM | POA: Diagnosis not present

## 2023-01-01 DIAGNOSIS — E1122 Type 2 diabetes mellitus with diabetic chronic kidney disease: Secondary | ICD-10-CM | POA: Diagnosis not present

## 2023-01-01 DIAGNOSIS — E7849 Other hyperlipidemia: Secondary | ICD-10-CM | POA: Diagnosis not present

## 2023-01-01 DIAGNOSIS — I10 Essential (primary) hypertension: Secondary | ICD-10-CM | POA: Diagnosis not present

## 2023-01-01 DIAGNOSIS — N1831 Chronic kidney disease, stage 3a: Secondary | ICD-10-CM | POA: Diagnosis not present

## 2023-01-01 DIAGNOSIS — Z79899 Other long term (current) drug therapy: Secondary | ICD-10-CM | POA: Diagnosis not present

## 2023-01-01 DIAGNOSIS — E1165 Type 2 diabetes mellitus with hyperglycemia: Secondary | ICD-10-CM | POA: Diagnosis not present

## 2023-01-01 DIAGNOSIS — I5022 Chronic systolic (congestive) heart failure: Secondary | ICD-10-CM | POA: Diagnosis not present

## 2023-01-01 DIAGNOSIS — R739 Hyperglycemia, unspecified: Secondary | ICD-10-CM | POA: Diagnosis not present

## 2023-01-01 DIAGNOSIS — E039 Hypothyroidism, unspecified: Secondary | ICD-10-CM | POA: Diagnosis not present

## 2023-01-02 ENCOUNTER — Other Ambulatory Visit: Payer: Self-pay

## 2023-01-02 MED ORDER — RIVAROXABAN 15 MG PO TABS: 15.0000 mg | ORAL_TABLET | Freq: Every day | ORAL | 1 refills | Status: DC

## 2023-01-02 NOTE — Telephone Encounter (Signed)
Received a fax from [facility] speciality pharmacy for Xarelto refill. Pt last saw Dr Antoine Poche 12/22/22, last labs 07/30/22 Creat 1.02, age 72, weight 71.6kg, CrCl 48.89, based on CrCl pt is on appropriate dosage of Xarelto 15mg  every day for LV clot and hx of CVA.  Will refill rx.

## 2023-01-06 DIAGNOSIS — Z0001 Encounter for general adult medical examination with abnormal findings: Secondary | ICD-10-CM | POA: Diagnosis not present

## 2023-01-06 DIAGNOSIS — E7849 Other hyperlipidemia: Secondary | ICD-10-CM | POA: Diagnosis not present

## 2023-01-06 DIAGNOSIS — I5022 Chronic systolic (congestive) heart failure: Secondary | ICD-10-CM | POA: Diagnosis not present

## 2023-01-06 DIAGNOSIS — N1831 Chronic kidney disease, stage 3a: Secondary | ICD-10-CM | POA: Diagnosis not present

## 2023-01-06 DIAGNOSIS — E876 Hypokalemia: Secondary | ICD-10-CM | POA: Diagnosis not present

## 2023-01-06 DIAGNOSIS — D6869 Other thrombophilia: Secondary | ICD-10-CM | POA: Diagnosis not present

## 2023-01-06 DIAGNOSIS — Z1389 Encounter for screening for other disorder: Secondary | ICD-10-CM | POA: Diagnosis not present

## 2023-01-06 DIAGNOSIS — E559 Vitamin D deficiency, unspecified: Secondary | ICD-10-CM | POA: Diagnosis not present

## 2023-01-06 DIAGNOSIS — R030 Elevated blood-pressure reading, without diagnosis of hypertension: Secondary | ICD-10-CM | POA: Diagnosis not present

## 2023-01-06 DIAGNOSIS — E1122 Type 2 diabetes mellitus with diabetic chronic kidney disease: Secondary | ICD-10-CM | POA: Diagnosis not present

## 2023-01-06 DIAGNOSIS — I420 Dilated cardiomyopathy: Secondary | ICD-10-CM | POA: Diagnosis not present

## 2023-01-07 ENCOUNTER — Encounter: Payer: Self-pay | Admitting: *Deleted

## 2023-01-16 ENCOUNTER — Other Ambulatory Visit: Payer: Self-pay | Admitting: Cardiology

## 2023-02-18 DIAGNOSIS — C50919 Malignant neoplasm of unspecified site of unspecified female breast: Secondary | ICD-10-CM | POA: Diagnosis not present

## 2023-02-19 ENCOUNTER — Ambulatory Visit (INDEPENDENT_AMBULATORY_CARE_PROVIDER_SITE_OTHER): Payer: Medicare Other

## 2023-02-19 DIAGNOSIS — I420 Dilated cardiomyopathy: Secondary | ICD-10-CM

## 2023-02-19 LAB — CUP PACEART REMOTE DEVICE CHECK
Battery Remaining Longevity: 33 mo
Battery Remaining Percentage: 34 %
Battery Voltage: 2.87 V
Brady Statistic AP VP Percent: 1 %
Brady Statistic AP VS Percent: 15 %
Brady Statistic AS VP Percent: 1 %
Brady Statistic AS VS Percent: 84 %
Brady Statistic RA Percent Paced: 15 %
Brady Statistic RV Percent Paced: 1 %
Date Time Interrogation Session: 20250109040019
HighPow Impedance: 62 Ohm
HighPow Impedance: 62 Ohm
Implantable Lead Connection Status: 753985
Implantable Lead Connection Status: 753985
Implantable Lead Implant Date: 20100416
Implantable Lead Implant Date: 20100416
Implantable Lead Location: 753859
Implantable Lead Location: 753860
Implantable Lead Model: 7122
Implantable Pulse Generator Implant Date: 20170626
Lead Channel Impedance Value: 380 Ohm
Lead Channel Impedance Value: 390 Ohm
Lead Channel Pacing Threshold Amplitude: 0.5 V
Lead Channel Pacing Threshold Amplitude: 1 V
Lead Channel Pacing Threshold Pulse Width: 0.5 ms
Lead Channel Pacing Threshold Pulse Width: 0.5 ms
Lead Channel Sensing Intrinsic Amplitude: 0.7 mV
Lead Channel Sensing Intrinsic Amplitude: 11.7 mV
Lead Channel Setting Pacing Amplitude: 2 V
Lead Channel Setting Pacing Amplitude: 2.5 V
Lead Channel Setting Pacing Pulse Width: 0.5 ms
Lead Channel Setting Sensing Sensitivity: 0.5 mV
Pulse Gen Serial Number: 7370130

## 2023-03-17 DIAGNOSIS — E119 Type 2 diabetes mellitus without complications: Secondary | ICD-10-CM | POA: Diagnosis not present

## 2023-03-31 NOTE — Progress Notes (Signed)
 Remote ICD transmission.

## 2023-04-08 ENCOUNTER — Encounter: Payer: Self-pay | Admitting: Internal Medicine

## 2023-04-10 DIAGNOSIS — E1122 Type 2 diabetes mellitus with diabetic chronic kidney disease: Secondary | ICD-10-CM | POA: Diagnosis not present

## 2023-04-10 DIAGNOSIS — I5022 Chronic systolic (congestive) heart failure: Secondary | ICD-10-CM | POA: Diagnosis not present

## 2023-04-10 DIAGNOSIS — E782 Mixed hyperlipidemia: Secondary | ICD-10-CM | POA: Diagnosis not present

## 2023-04-10 DIAGNOSIS — E559 Vitamin D deficiency, unspecified: Secondary | ICD-10-CM | POA: Diagnosis not present

## 2023-04-28 ENCOUNTER — Encounter (INDEPENDENT_AMBULATORY_CARE_PROVIDER_SITE_OTHER): Payer: Self-pay | Admitting: *Deleted

## 2023-05-01 ENCOUNTER — Encounter (INDEPENDENT_AMBULATORY_CARE_PROVIDER_SITE_OTHER): Payer: Self-pay | Admitting: *Deleted

## 2023-05-02 DIAGNOSIS — J069 Acute upper respiratory infection, unspecified: Secondary | ICD-10-CM | POA: Diagnosis not present

## 2023-05-02 DIAGNOSIS — Z6823 Body mass index (BMI) 23.0-23.9, adult: Secondary | ICD-10-CM | POA: Diagnosis not present

## 2023-05-11 DIAGNOSIS — I5022 Chronic systolic (congestive) heart failure: Secondary | ICD-10-CM | POA: Diagnosis not present

## 2023-05-11 DIAGNOSIS — E559 Vitamin D deficiency, unspecified: Secondary | ICD-10-CM | POA: Diagnosis not present

## 2023-05-11 DIAGNOSIS — E782 Mixed hyperlipidemia: Secondary | ICD-10-CM | POA: Diagnosis not present

## 2023-05-11 DIAGNOSIS — E1122 Type 2 diabetes mellitus with diabetic chronic kidney disease: Secondary | ICD-10-CM | POA: Diagnosis not present

## 2023-05-20 ENCOUNTER — Telehealth (INDEPENDENT_AMBULATORY_CARE_PROVIDER_SITE_OTHER): Payer: Self-pay | Admitting: Gastroenterology

## 2023-05-20 NOTE — Telephone Encounter (Signed)
 Who is your primary care physician: Dr.Terry Reuel Boom  Reasons for the colonoscopy:   Have you had a colonoscopy before?  yes  Do you have family history of colon cancer? Yes Brother  Previous colonoscopy with polyps removed? Yes   Do you have a history colorectal cancer?   no  Are you diabetic? If yes, Type 1 or Type 2?    borderline  Do you have a prosthetic or mechanical heart valve? no  Do you have a pacemaker/defibrillator?   yes  Have you had endocarditis/atrial fibrillation? no  Have you had joint replacement within the last 12 months?  no  Do you tend to be constipated or have to use laxatives? Stool softener  Do you have any history of drugs or alchohol?  no  Do you use supplemental oxygen?  no  Have you had a stroke or heart attack within the last 6 months? no  Do you take weight loss medication?  no  For female patients: have you had a hysterectomy?   yes                                    are you post menopausal?       yes                                            do you still have your menstrual cycle? no      Do you take any blood-thinning medications such as: (aspirin, warfarin, Plavix, Aggrenox)    If yes we need the name, milligram, dosage and who is prescribing doctor Xarelto 15 mg Current Outpatient Medications on File Prior to Visit  Medication Sig Dispense Refill   anastrozole (ARIMIDEX) 1 MG tablet Take 1 mg by mouth in the morning.     ascorbic acid (VITAMIN C) 500 MG tablet Take 1 tablet (500 mg total) by mouth daily. 30 tablet 2   atorvastatin (LIPITOR) 20 MG tablet Take 1 tablet (20 mg total) by mouth daily. (Patient taking differently: Take 20 mg by mouth every evening.) 30 tablet 2   BIOTIN PO Take 1 tablet by mouth daily.     digoxin (LANOXIN) 0.125 MG tablet Take 0.5 tablets (0.0625 mg total) by mouth daily. 45 tablet 3   empagliflozin (JARDIANCE) 10 MG TABS tablet Take 1 tablet (10 mg total) by mouth daily before breakfast. 90 tablet 3    furosemide (LASIX) 40 MG tablet Take 2 tablets (80 mg total) by mouth every other day. (Patient taking differently: Take 60 mg by mouth daily as needed (fluid retention (typically 5x's a week)).) 90 tablet 3   metoprolol succinate (TOPROL-XL) 50 MG 24 hr tablet TAKE 1 TABLET BY MOUTH TWICE DAILY with OR immediately following a meal 180 tablet 2   Multiple Vitamin (MULTIVITAMIN WITH MINERALS) TABS tablet Take 1 tablet by mouth in the morning.     Omega-3 Fatty Acids (FISH OIL PO) Take 1 capsule by mouth in the morning.     Rivaroxaban (XARELTO) 15 MG TABS tablet Take 1 tablet (15 mg total) by mouth daily with supper. 90 tablet 1   sacubitril-valsartan (ENTRESTO) 97-103 MG Take 1 tablet by mouth 2 (two) times daily. 180 tablet 3   spironolactone (ALDACTONE) 25 MG tablet TAKE 1/2 TABLET BY MOUTH ONCE DAILY  45 tablet 3   Vitamin D, Ergocalciferol, (DRISDOL) 1.25 MG (50000 UNIT) CAPS capsule Take 50,000 Units by mouth every Wednesday.     polyethylene glycol-electrolytes (NULYTELY) 420 g solution Take 4,000 mLs by mouth once. (Patient not taking: Reported on 12/22/2022)     No current facility-administered medications on file prior to visit.    No Known Allergies   Pharmacy: [facility]  Primary Insurance Name: Medicare/UHC  Best number where you can be reached: [phone]

## 2023-05-21 ENCOUNTER — Telehealth (INDEPENDENT_AMBULATORY_CARE_PROVIDER_SITE_OTHER): Payer: Self-pay | Admitting: Gastroenterology

## 2023-05-21 ENCOUNTER — Ambulatory Visit: Payer: Medicare Other

## 2023-05-21 DIAGNOSIS — I420 Dilated cardiomyopathy: Secondary | ICD-10-CM | POA: Diagnosis not present

## 2023-05-21 NOTE — Telephone Encounter (Signed)
 Room 3, Jardiance per protocol, clearance to hold Xarelto Thanks

## 2023-05-21 NOTE — Telephone Encounter (Signed)
    05/21/23  Jeananne Rama 02-06-1951  What type of surgery is being performed? Colonoscopy  When is surgery scheduled? TBD  What type of clearance is required (medical or pharmacy to hold medication or both? Pharmacy to hold medication  Are there any medications that need to be held prior to surgery and how long? Clearance to hold Xarelto 2 days prior  Name of physician performing surgery?  Dr. Katrinka Blazing [facility] Phone: [phone] Fax: [phone]  Anethesia type (none, local, MAC, general)? Choice

## 2023-05-21 NOTE — Telephone Encounter (Signed)
Pharmacy please advise on holding Xarelto prior to colonoscopy scheduled for TBD. Thank you.

## 2023-05-21 NOTE — Telephone Encounter (Signed)
Clearance sent to cardiology pool

## 2023-05-22 LAB — CUP PACEART REMOTE DEVICE CHECK
Battery Remaining Longevity: 32 mo
Battery Remaining Percentage: 33 %
Battery Voltage: 2.87 V
Brady Statistic AP VP Percent: 1 %
Brady Statistic AP VS Percent: 16 %
Brady Statistic AS VP Percent: 1 %
Brady Statistic AS VS Percent: 83 %
Brady Statistic RA Percent Paced: 15 %
Brady Statistic RV Percent Paced: 1 %
Date Time Interrogation Session: 20250410040039
HighPow Impedance: 61 Ohm
HighPow Impedance: 61 Ohm
Implantable Lead Connection Status: 753985
Implantable Lead Connection Status: 753985
Implantable Lead Implant Date: 20100416
Implantable Lead Implant Date: 20100416
Implantable Lead Location: 753859
Implantable Lead Location: 753860
Implantable Lead Model: 7122
Implantable Pulse Generator Implant Date: 20170626
Lead Channel Impedance Value: 360 Ohm
Lead Channel Impedance Value: 380 Ohm
Lead Channel Pacing Threshold Amplitude: 0.5 V
Lead Channel Pacing Threshold Amplitude: 1 V
Lead Channel Pacing Threshold Pulse Width: 0.5 ms
Lead Channel Pacing Threshold Pulse Width: 0.5 ms
Lead Channel Sensing Intrinsic Amplitude: 1.5 mV
Lead Channel Sensing Intrinsic Amplitude: 11.7 mV
Lead Channel Setting Pacing Amplitude: 2 V
Lead Channel Setting Pacing Amplitude: 2.5 V
Lead Channel Setting Pacing Pulse Width: 0.5 ms
Lead Channel Setting Sensing Sensitivity: 0.5 mV
Pulse Gen Serial Number: 7370130

## 2023-05-22 NOTE — Telephone Encounter (Signed)
 Patient with diagnosis of LV thrombus/CVA on Xarelto for anticoagulation.    What type of surgery is being performed? Colonoscopy   When is surgery scheduled? TBD  Stroke in 2010 - cardio-embolic 2/2 LV thrombus w/dilated cardiomyopathy CrCl 57 Platelet count 180  Per office protocol, patient can hold Xarelto for 2 days prior to procedure.    Was previously bridged when on warfarin, however now on Xarelto, only needs 2 day hold. Will route to primary cardiologist to be sure okay to hold  2 days without bridging.   **This guidance is not considered finalized until pre-operative APP has relayed final recommendations.**

## 2023-05-23 ENCOUNTER — Encounter: Payer: Self-pay | Admitting: Internal Medicine

## 2023-05-26 NOTE — Telephone Encounter (Signed)
   Patient Name: [name] [name]  DOB: 06/21/1950 MRN: [id number]  Primary Cardiologist: Eilleen Grates, MD  Clinical pharmacists have reviewed the patient's past medical history, labs, and current medications as part of preoperative protocol coverage. The following recommendations have been made:   Per Dr. Lavonne Prairie: ok to hold Xarelto for 2 days prior to procedure. NO bridge.  Resume the day of the procedure.   I will route this recommendation to the requesting party via Epic fax function and remove from pre-op pool.  Please call with questions.  Ava Boatman, NP 05/26/2023, 3:59 PM

## 2023-05-27 ENCOUNTER — Telehealth: Payer: Self-pay | Admitting: Cardiology

## 2023-05-27 ENCOUNTER — Other Ambulatory Visit (HOSPITAL_COMMUNITY): Payer: Self-pay | Admitting: General Practice

## 2023-05-27 DIAGNOSIS — G453 Amaurosis fugax: Secondary | ICD-10-CM

## 2023-05-27 DIAGNOSIS — Z6823 Body mass index (BMI) 23.0-23.9, adult: Secondary | ICD-10-CM | POA: Diagnosis not present

## 2023-05-27 NOTE — Telephone Encounter (Signed)
 Returned call to pt she states that she "blacked out" today for about 15 minutes or less. She did not take her BP. Her BP now is 100/70. She states that she did not experience any other symptoms before or after this happened. She states that the blackness was gradual it started first with the blackout of just the Dr's head and chest for a few seconds then everything went black.  Pcp ordered a Carotid MRI and a brain MRI. She is taking all her current medications.

## 2023-05-27 NOTE — Telephone Encounter (Signed)
 Pt called in stating today her right eye went black for about 15 minutes. She states she called her eye doctor who told her to contact PCP. She just left PCP an MRI. They told her to let Dr. Lavonne Prairie know what happened and suggested she have another echo. Please advise.

## 2023-05-28 NOTE — Telephone Encounter (Signed)
 Call patient to advise sending remote transmission. Patient will send in a few minutes.   Routing to gen cards for nurse visit.

## 2023-05-28 NOTE — Telephone Encounter (Signed)
 Spoke with patient and she is scheduled for BP check tomorrow per Dr. Lavonne Prairie

## 2023-05-29 ENCOUNTER — Ambulatory Visit: Attending: Cardiology

## 2023-05-29 DIAGNOSIS — I951 Orthostatic hypotension: Secondary | ICD-10-CM | POA: Diagnosis not present

## 2023-05-29 NOTE — Progress Notes (Signed)
   Nurse Visit   Date of Encounter: 05/29/2023 ID: ADREAN FINDLAY, DOB 06/28/1950, MRN [id number]  PCP:  Leesa Pulling, MD   [facility] Providers Cardiologist:  Eilleen Grates, MD Electrophysiologist:  Richardo Chandler, MD      Visit Details   VS:  BP 100/60 (BP Location: Right Arm, Patient Position: Sitting)   Pulse 62   Ht 5\' 8"  (1.727 m)   Wt 155 lb 12.8 oz (70.7 kg)   SpO2 98%   BMI 23.69 kg/m  , BMI Body mass index is 23.69 kg/m.  Wt Readings from Last 3 Encounters:  05/29/23 155 lb 12.8 oz (70.7 kg)  12/22/22 157 lb 12.8 oz (71.6 kg)  09/05/22 163 lb 5.8 oz (74.1 kg)     Reason for visit: Orthostatic BPs Performed today: Vitals, Provider consulted:Dr. Albert Huff, and Education Changes (medications, testing, etc.) : Dr. Albert Huff suggested patient decrease her lasix  from 80 mg every other day to 40 mg every other day. Then decrease Entresto  to 49/51 mg BID. Then check her weight and her BP/HR daily. Instructed to hold BP medications if her SBP was less than 120 or HR less then 55. Patient refused to make any changes until Dr. Lavonne Prairie has reviewed everything. Will give patient instructions regardless. Patient will think about cutting lasix  back since she has already started do this. Will send message to Dr. Lavonne Prairie. Length of Visit: 20 minutes    Medications Adjustments/Labs and Tests Ordered: No orders of the defined types were placed in this encounter.  No orders of the defined types were placed in this encounter.    Signed, Antonetta Kitchen, RN  05/29/2023 5:00 PM

## 2023-05-29 NOTE — Telephone Encounter (Signed)
 Remote transmission received. Normal device function. No alerts noted. Presenting rhythm ~ NSR.

## 2023-05-29 NOTE — Patient Instructions (Addendum)
 Medication Instructions:  Decrease Lasix  to 40 mg every other day Decrease Entresto  49/51 twice daily Take Digoxin  in the evening Take Spironolactone  mid morning or noon **HOLD Digoxin , Jardiance , Lasix , Metoprolol  Succinate, Entresto  and or Spironolactone  if systolic blood pressure is less than 120 and or your heart rate is less than 55  *If you need a refill on your cardiac medications before your next appointment, please call your pharmacy*  Lab Work: NONE If you have labs (blood work) drawn today and your tests are completely normal, you will receive your results only by: MyChart Message (if you have MyChart) OR A paper copy in the mail If you have any lab test that is abnormal or we need to change your treatment, we will call you to review the results.  Testing/Procedures: NONE  Follow-Up: At [facility], you and your health needs are our priority.  As part of our continuing mission to provide you with exceptional heart care, our providers are all part of one team.  This team includes your primary Cardiologist (physician) and Advanced Practice Providers or APPs (Physician Assistants and Nurse Practitioners) who all work together to provide you with the care you need, when you need it.  Your next appointment:   To be determined   We recommend signing up for the patient portal called MyChart.  Sign up information is provided on this After Visit Summary.  MyChart is used to connect with patients for Virtual Visits (Telemedicine).  Patients are able to view lab/test results, encounter notes, upcoming appointments, etc.  Non-urgent messages can be sent to your provider as well.   To learn more about what you can do with MyChart, go to [email or web address].   Other Instructions Check weight and blood pressure every day and keep a log.     1st Floor: - Lobby - Registration  - Pharmacy  - Lab - Cafe  2nd Floor: - PV Lab - Diagnostic Testing (echo, CT, nuclear  med)  3rd Floor: - Vacant  4th Floor: - TCTS (cardiothoracic surgery) - AFib Clinic - Structural Heart Clinic - Vascular Surgery  - Vascular Ultrasound  5th Floor: - HeartCare Cardiology (general and EP) - Clinical Pharmacy for coumadin , hypertension, lipid, weight-loss medications, and med management appointments    Valet parking services will be available as well.

## 2023-06-02 ENCOUNTER — Ambulatory Visit: Attending: Cardiology | Admitting: Cardiology

## 2023-06-02 VITALS — BP 108/64 | HR 56 | Ht 68.0 in | Wt 155.0 lb

## 2023-06-02 DIAGNOSIS — I420 Dilated cardiomyopathy: Secondary | ICD-10-CM

## 2023-06-02 DIAGNOSIS — G453 Amaurosis fugax: Secondary | ICD-10-CM | POA: Diagnosis not present

## 2023-06-02 DIAGNOSIS — Z95810 Presence of automatic (implantable) cardiac defibrillator: Secondary | ICD-10-CM

## 2023-06-02 NOTE — Telephone Encounter (Signed)
 Pt contacted. Pt would like to be scheduled for 07/14/23 at 8:15am. Will schedule pt for 07/14/23 once June schedule is open.

## 2023-06-03 NOTE — Telephone Encounter (Signed)
 Scheduled appointment 06/11/2023 at 9:20 AM. Left detailed message to come to [facility] st office at [address] and to arrive at 9:15am.

## 2023-06-04 ENCOUNTER — Ambulatory Visit (HOSPITAL_COMMUNITY)
Admission: RE | Admit: 2023-06-04 | Discharge: 2023-06-04 | Disposition: A | Discharge status: Home / Self Care | Source: Ambulatory Visit | Attending: General Practice | Admitting: General Practice

## 2023-06-04 DIAGNOSIS — G453 Amaurosis fugax: Secondary | ICD-10-CM | POA: Insufficient documentation

## 2023-06-05 ENCOUNTER — Telehealth: Payer: Self-pay

## 2023-06-05 DIAGNOSIS — D649 Anemia, unspecified: Secondary | ICD-10-CM

## 2023-06-05 DIAGNOSIS — I5022 Chronic systolic (congestive) heart failure: Secondary | ICD-10-CM

## 2023-06-05 DIAGNOSIS — G453 Amaurosis fugax: Secondary | ICD-10-CM

## 2023-06-05 MED ORDER — RIVAROXABAN 20 MG PO TABS: 20.0000 mg | ORAL_TABLET | Freq: Every day | ORAL | 3 refills | Status: AC

## 2023-06-05 NOTE — Progress Notes (Signed)
    Mrs. Keeling came to my office today with her sister who had a visit.  She described an episode of amaurosis fugax.  She had an EKG that was unchanged from previous and demonstrated no arrhythmias.  I was able to speak with the ophthalmologist who saw her on the day of this visit through the day before.  There was no clear etiology.  It was suggested the patient have an ESR CRP and CBC.  She did have an interrogation of her device and there was no indication of atrial arrhythmias.  This most recent interrogation was 4/11.  After leaving the office she did get carotid Dopplers and I see these results with no evidence of carotid stenosis.  I will repeat an echocardiogram.  I will draw the labs as listed.  I will also refer her to neuro-ophthalmology.

## 2023-06-05 NOTE — Telephone Encounter (Signed)
 Called patient. Verified name and DOB.  Notified patient: -ESR, CRP, and CBC lab orders. Patient aware she can go to new building to have labs drawn.   Orders placed in chart. -echocardiogram order and patient aware schedulers will contact her next week as we are nearing the end of the work day. Order placed in chart.  -her carotid tests were okay.   -increase her Xarelto  to 20 mg. Prescription sent to pharmacy.  Explained to patient we do not need an MRI of brain yet and this was reviewed with ophthalmology. Also, Dr. Lavonne Prairie wants to see her back in office after echocardiogram. Patient has appt with Dr. Lavonne Prairie on 06/11/2023. If tests done after this date, I informed patient her follow up may be moved up.  Patient verbalized understanding.  Josie RN

## 2023-06-08 ENCOUNTER — Telehealth: Payer: Self-pay | Admitting: Cardiology

## 2023-06-08 NOTE — Telephone Encounter (Signed)
 Spoke to patient she stated echo is scheduled 5/2.Stated she wants to see Dr.Hochrein soon so she will keep appointment with him 5/1 at 9:20 am.

## 2023-06-08 NOTE — Telephone Encounter (Signed)
 Patient is scheduled to see Dr. Lavonne Prairie on 06/11/23. Patient is needing to complete an Echo, the soonest we could schedule her for the Echo was for 06/12/23.

## 2023-06-09 ENCOUNTER — Other Ambulatory Visit (HOSPITAL_COMMUNITY)
Admission: RE | Admit: 2023-06-09 | Discharge: 2023-06-09 | Disposition: A | Discharge status: Home / Self Care | Source: Ambulatory Visit | Attending: Cardiology | Admitting: Cardiology

## 2023-06-09 DIAGNOSIS — D649 Anemia, unspecified: Secondary | ICD-10-CM | POA: Insufficient documentation

## 2023-06-09 LAB — CBC
HCT: 38.2 % (ref 36.0–46.0)
Hemoglobin: 12.2 g/dL (ref 12.0–15.0)
MCH: 29 pg (ref 26.0–34.0)
MCHC: 31.9 g/dL (ref 30.0–36.0)
MCV: 90.7 fL (ref 80.0–100.0)
Platelets: 127 10*3/uL — ABNORMAL LOW (ref 150–400)
RBC: 4.21 MIL/uL (ref 3.87–5.11)
RDW: 15.2 % (ref 11.5–15.5)
WBC: 6 10*3/uL (ref 4.0–10.5)
nRBC: 0 % (ref 0.0–0.2)

## 2023-06-09 LAB — SEDIMENTATION RATE: Sed Rate: 5 mm/h (ref 0–22)

## 2023-06-09 LAB — C-REACTIVE PROTEIN: CRP: 1 mg/dL — ABNORMAL HIGH (ref <1.0)

## 2023-06-10 ENCOUNTER — Encounter: Payer: Self-pay | Admitting: *Deleted

## 2023-06-10 DIAGNOSIS — E1122 Type 2 diabetes mellitus with diabetic chronic kidney disease: Secondary | ICD-10-CM | POA: Diagnosis not present

## 2023-06-10 DIAGNOSIS — E559 Vitamin D deficiency, unspecified: Secondary | ICD-10-CM | POA: Diagnosis not present

## 2023-06-10 DIAGNOSIS — G453 Amaurosis fugax: Secondary | ICD-10-CM

## 2023-06-10 DIAGNOSIS — E782 Mixed hyperlipidemia: Secondary | ICD-10-CM | POA: Diagnosis not present

## 2023-06-10 DIAGNOSIS — I5022 Chronic systolic (congestive) heart failure: Secondary | ICD-10-CM | POA: Diagnosis not present

## 2023-06-10 HISTORY — DX: Amaurosis fugax: G45.3

## 2023-06-10 NOTE — Progress Notes (Signed)
  Cardiology Office Note:   Date:  06/11/2023  ID:  [name] [name], DOB 05/09/1950, MRN [id number] PCP: Leesa Pulling, MD   HeartCare Providers Cardiologist:  Eilleen Grates, MD Electrophysiologist:  Richardo Chandler, MD {  History of Present Illness:   [name] [name] is a 73 y.o. female who presents for follow up of cardiomyopathy.  She has a history of a nonischemic cardiomyopathy. Her ejection fraction was at one point in time 15 - 20%. A later ejection fraction was about 40% in 2015.   Most recently the EF was 25% again.   I sent her for a CPX in 2018 which demonstrated a good VO2 Max.    She has had an episode of amaurosis fugax.  Carotid doppler was unremarkable.  C reactive protein was upper limites of normal.  Sed rate was upper limits but down from previous.   She reports that the day that this happened she was sitting getting her nails done.  She had to have a classic description of amaurosis fugax.  She has not had any symptoms prior to this.  She was not having any palpitations, presyncope or syncope.  She does not have any numbness or tingling.  She had no loss of motor or voice.  She has since done well.  She never had this happen before.  She is otherwise been doing well and doing her walking without symptoms.  ROS: As stated in the HPI and negative for all other systems.  Studies Reviewed:    EKG:   See note from the other day.   Risk Assessment/Calculations:              Physical Exam:   VS:  BP (!) 94/54   Pulse (!) 58   Ht 5\' 8"  (1.727 m)   Wt 136 lb (61.7 kg)   SpO2 98%   BMI 20.68 kg/m    Wt Readings from Last 3 Encounters:  06/11/23 136 lb (61.7 kg)  06/02/23 155 lb (70.3 kg)  05/29/23 155 lb 12.8 oz (70.7 kg)     GEN: Well nourished, well developed in no acute distress NECK: No JVD; No carotid bruits CARDIAC: RRR, no murmurs, rubs, gallops RESPIRATORY:  Clear to auscultation without rales, wheezing or rhonchi  ABDOMEN: Soft, non-tender,  non-distended EXTREMITIES:  No edema; No deformity   ASSESSMENT AND PLAN:   CARDIOMYOPATHY, PRIMARY, DILATED:   She tolerating current meds.  She does not want to consider BiV upgrade.  She has class I symptoms.  Blood pressure would not allow med titration.  Continue as listed.  ICD :   She is up-to-date with follow-up.  I checked the April interrogation and there were no significant dysrhythmias.  She has follow-up in EP early summer.   LV CLOT:   When I saw her briefly in the office the other day I increased her Xarelto  to 20 mg daily.  Echocardiogram is scheduled for tomorrow.   AMAOURSIS FUGAX: Carotid has been unremarkable.  Inflammatory markers have been nondiagnostic.  Echo is pending as above.  I will consider an MRI if all of this is normal.  I also like to refer her to neuro-ophthalmology.  Suspect that if this might have been embolic I have increased her Xarelto .  We are also treating for progressive risk reduction with lipid management.    Follow up with me in two months.   Signed, Eilleen Grates, MD

## 2023-06-10 NOTE — Telephone Encounter (Signed)
 Called and discussed upcoming appt tomorrow. Pt will arrive early for appt.

## 2023-06-11 ENCOUNTER — Telehealth: Payer: Self-pay | Admitting: Cardiology

## 2023-06-11 ENCOUNTER — Ambulatory Visit: Attending: Cardiology | Admitting: Cardiology

## 2023-06-11 ENCOUNTER — Encounter: Payer: Self-pay | Admitting: Cardiology

## 2023-06-11 VITALS — BP 94/54 | HR 58 | Ht 68.0 in | Wt 136.0 lb

## 2023-06-11 DIAGNOSIS — G453 Amaurosis fugax: Secondary | ICD-10-CM

## 2023-06-11 DIAGNOSIS — I420 Dilated cardiomyopathy: Secondary | ICD-10-CM | POA: Diagnosis not present

## 2023-06-11 DIAGNOSIS — Z95810 Presence of automatic (implantable) cardiac defibrillator: Secondary | ICD-10-CM | POA: Diagnosis not present

## 2023-06-11 NOTE — Addendum Note (Signed)
 Addended by: Sims Duck R on: 06/11/2023 02:50 PM   Modules accepted: Orders

## 2023-06-11 NOTE — Patient Instructions (Signed)
 Medication Instructions:  Your physician recommends that you continue on your current medications as directed. Please refer to the Current Medication list given to you today.    *If you need a refill on your cardiac medications before your next appointment, please call your pharmacy*   Lab Work:  NONE   If you have labs (blood work) drawn today and your tests are completely normal, you will receive your results only by: MyChart Message (if you have MyChart) OR A paper copy in the mail If you have any lab test that is abnormal or we need to change your treatment, we will call you to review the results.   Testing/Procedures: NONE    Follow-Up: At [facility], you and your health needs are our priority.  As part of our continuing mission to provide you with exceptional heart care, we have created designated Provider Care Teams.  These Care Teams include your primary Cardiologist (physician) and Advanced Practice Providers (APPs -  Physician Assistants and Nurse Practitioners) who all work together to provide you with the care you need, when you need it.  We recommend signing up for the patient portal called "MyChart".  Sign up information is provided on this After Visit Summary.  MyChart is used to connect with patients for Virtual Visits (Telemedicine).  Patients are able to view lab/test results, encounter notes, upcoming appointments, etc.  Non-urgent messages can be sent to your provider as well.   To learn more about what you can do with MyChart, go to [email or web address].    Your next appointment:   2 month(s)  The format for your next appointment:   In Person  Provider:   Eilleen Grates, MD   Other Instructions

## 2023-06-11 NOTE — Telephone Encounter (Signed)
 Spoke with pt and advised calls were from automated messages regarding appointment for echo on 06/12/2023.  Pt verbalizes understanding and thanked Charity fundraiser for the call.

## 2023-06-11 NOTE — Telephone Encounter (Signed)
Patient states she was returning a call. Please advise  

## 2023-06-12 ENCOUNTER — Ambulatory Visit (HOSPITAL_COMMUNITY): Attending: Cardiology

## 2023-06-12 DIAGNOSIS — I5022 Chronic systolic (congestive) heart failure: Secondary | ICD-10-CM | POA: Diagnosis not present

## 2023-06-12 DIAGNOSIS — D649 Anemia, unspecified: Secondary | ICD-10-CM | POA: Diagnosis not present

## 2023-06-12 DIAGNOSIS — G453 Amaurosis fugax: Secondary | ICD-10-CM | POA: Insufficient documentation

## 2023-06-12 LAB — ECHOCARDIOGRAM COMPLETE
Area-P 1/2: 2.91 cm2
S' Lateral: 5.1 cm

## 2023-06-15 DIAGNOSIS — Z170 Estrogen receptor positive status [ER+]: Secondary | ICD-10-CM | POA: Diagnosis not present

## 2023-06-15 DIAGNOSIS — C50912 Malignant neoplasm of unspecified site of left female breast: Secondary | ICD-10-CM | POA: Diagnosis not present

## 2023-06-17 MED ORDER — PEG 3350-KCL-NA BICARB-NACL 420 G PO SOLR: 4000.0000 mL | Freq: Once | ORAL | 0 refills | Status: AC

## 2023-06-17 NOTE — Addendum Note (Signed)
 Addended by: Casimer Russett on: 06/17/2023 03:19 PM   Modules accepted: Orders

## 2023-06-17 NOTE — Telephone Encounter (Signed)
 Pt scheduled for 07/14/23 at 8:15am as requested. Pt contacted to make sure time/date was still good, pt verbalized ok. Instructions will be mailed to pt. Prep sent to pharmacy. No PA needed per insurance.

## 2023-06-18 NOTE — Telephone Encounter (Signed)
 Questionnaire from recall, no referral needed

## 2023-06-26 ENCOUNTER — Ambulatory Visit: Attending: Cardiology | Admitting: Cardiology

## 2023-06-26 VITALS — BP 92/49 | HR 59 | Ht 68.0 in | Wt 154.0 lb

## 2023-06-26 DIAGNOSIS — Z95810 Presence of automatic (implantable) cardiac defibrillator: Secondary | ICD-10-CM | POA: Diagnosis not present

## 2023-06-26 DIAGNOSIS — I447 Left bundle-branch block, unspecified: Secondary | ICD-10-CM

## 2023-06-26 DIAGNOSIS — I5022 Chronic systolic (congestive) heart failure: Secondary | ICD-10-CM | POA: Diagnosis not present

## 2023-06-26 LAB — CUP PACEART INCLINIC DEVICE CHECK
Battery Remaining Longevity: 34 mo
Brady Statistic RA Percent Paced: 16 %
Brady Statistic RV Percent Paced: 0.15 %
Date Time Interrogation Session: 20250516204138
HighPow Impedance: 74.25 Ohm
Implantable Lead Connection Status: 753985
Implantable Lead Connection Status: 753985
Implantable Lead Implant Date: 20100416
Implantable Lead Implant Date: 20100416
Implantable Lead Location: 753859
Implantable Lead Location: 753860
Implantable Lead Model: 7122
Implantable Pulse Generator Implant Date: 20170626
Lead Channel Impedance Value: 425 Ohm
Lead Channel Impedance Value: 462.5 Ohm
Lead Channel Pacing Threshold Amplitude: 0.75 V
Lead Channel Pacing Threshold Amplitude: 0.75 V
Lead Channel Pacing Threshold Amplitude: 1.25 V
Lead Channel Pacing Threshold Amplitude: 1.25 V
Lead Channel Pacing Threshold Pulse Width: 0.5 ms
Lead Channel Pacing Threshold Pulse Width: 0.5 ms
Lead Channel Pacing Threshold Pulse Width: 0.5 ms
Lead Channel Pacing Threshold Pulse Width: 0.5 ms
Lead Channel Sensing Intrinsic Amplitude: 0.8 mV
Lead Channel Sensing Intrinsic Amplitude: 12 mV
Lead Channel Setting Pacing Amplitude: 2 V
Lead Channel Setting Pacing Amplitude: 2.5 V
Lead Channel Setting Pacing Pulse Width: 0.5 ms
Lead Channel Setting Sensing Sensitivity: 0.5 mV
Pulse Gen Serial Number: 7370130

## 2023-06-26 NOTE — Progress Notes (Signed)
 Electrophysiology Office Follow up Visit Note:    Date:  06/26/2023   ID:  CHELCI HAUGHNEY, DOB Oct 22, 1950, MRN [id number]  PCP:  Leesa Pulling, MD  [facility] Cardiologist:  Eilleen Grates, MD  [facility] Electrophysiologist:  Richardo Chandler, MD    Interval History:     [name] [name] is a 73 y.o. female who presents for a follow up visit.   The patient was previously followed by Dr. Rodolfo Clan.  She has an ICD for primary prevention given a history of chronic systolic heart failure secondary to LAMIN gene mutation.  She had a generator replacement in 2017.  Thanks she has a history of LV thrombus on Coumadin .  She has a history of stroke and breast cancer.  She is doing okay today.  She has no residual visual deficit after her recent amaurosis fugax episode.      Past medical, surgical, social and family history were reviewed.  ROS:   Please see the history of present illness.    All other systems reviewed and are negative.  EKGs/Labs/Other Studies Reviewed:    The following studies were reviewed today:  Jun 26, 2023 in-clinic device interrogation personally reviewed Battery longevity 2.9 years Lead parameter stable Atrial pacing 16% Ventricular pacing less than 1% Atrial fibrillation burden 0% No high-voltage therapies  Jun 12, 2023 echo EF 30-35 RV normal Mildly dilated left atrium Moderate MR Moderate PI  June 02, 2023 EKG shows atrial paced, ventricular sensed rhythm.  Left bundle branch block with a QRS duration of 146 ms  February 20, 2019 chest x-ray shows dual-chamber, single coil ICD in the left chest.       Physical Exam:    VS:  BP (!) 92/49   Pulse (!) 59   Ht 5\' 8"  (1.727 m)   Wt 154 lb (69.9 kg)   SpO2 98%   BMI 23.42 kg/m     Wt Readings from Last 3 Encounters:  06/26/23 154 lb (69.9 kg)  06/11/23 136 lb (61.7 kg)  06/02/23 155 lb (70.3 kg)     GEN: no distress CARD: RRR, No MRG.  CIED pocket well-healed RESP: No IWOB.  CTAB.      ASSESSMENT:    1. ICD (implantable cardioverter-defibrillator), dual, in situ   2. Chronic systolic HF (heart failure) (HCC)   3. Left bundle branch block    PLAN:    In order of problems listed above:  #Chronic systolic heart failure NYHA class II-III.  EF 30 to 35%.  Has a dual coil ICD in situ.  She has a left bundle branch block.  I discussed the role of CRT in treating patients with left bundle branch block and systolic heart failure.  She is interested in proceeding with upgrade procedure.  We will need to make sure the vein is open prior to opening the pocket.  Risks, benefits, alternatives to CRT-D upgrade were discussed in detail with the patient today. The patient understands that the risks include but are not limited to bleeding, infection, pneumothorax, perforation, tamponade, vascular damage, renal failure, MI, stroke, death, and lead dislodgement and wishes to proceed.  We will therefore schedule device implantation at the next available time.  She will need to hold her Xarelto  for 24 hours prior to the procedure to minimize the risks of bleeding.  The pros/cons and risks of holding anticoagulation given her history of LV thrombus were discussed in detail during today's visit.  #History of amaurosis fugax Continue Xarelto .  Signed, Harvie Liner, MD, [facility], [facility] 06/26/2023 3:50 PM    Electrophysiology [facility]

## 2023-06-26 NOTE — Patient Instructions (Addendum)
 Medication Instructions:  Your physician recommends that you continue on your current medications as directed. Please refer to the Current Medication list given to you today.  *If you need a refill on your cardiac medications before your next appointment, please call your pharmacy*  Lab Work: Labwork with be needed within 30 days of procedure.  You may go to any [facility] Location for your lab work:  [facility] - [address] ([facility]) - [address] [phone] [address]   - [address]   [place]  - [address]   [place] - [address] - [address] [employer]  - [address] ([facility]   If you have labs (blood work) drawn today and your tests are completely normal, you will receive your results only by: [employer] (if you have MyChart)  If you have any lab test that is abnormal or we need to change your treatment, we will call you or send a MyChart message to review the results.  Testing/Procedures: CRT upgrade procedure dates: August - 5, 6, 8, 11, 13, 15 You will stay overnight for this procedure Follow-Up: At [facility], you and your health needs are our priority.  As part of our continuing mission to provide you with exceptional heart care, we have created designated Provider Care Teams.  These Care Teams include your primary Cardiologist (physician) and Advanced Practice Providers (APPs -  Physician Assistants and Nurse Practitioners) who all work together to provide you with the care you need, when you need it.  Your next appointment:   To be scheduled  The format for your next appointment:   In Person  Provider:   Harvie Liner, MD{or one of the following Advanced Practice Providers on your designated Care Team:   Mertha Abrahams, [place] Bambi Lever "Jonelle Neri" [place], [place] Neda Balk, NP  Note: Remote  monitoring is used to monitor your Pacemaker/ ICD from home. This monitoring reduces the number of office visits required to check your device to one time per year. It allows us  to keep an eye on the functioning of your device to ensure it is working properly.

## 2023-06-28 ENCOUNTER — Ambulatory Visit: Payer: Self-pay | Admitting: Cardiology

## 2023-06-29 ENCOUNTER — Telehealth (INDEPENDENT_AMBULATORY_CARE_PROVIDER_SITE_OTHER): Payer: Self-pay | Admitting: Gastroenterology

## 2023-06-29 ENCOUNTER — Telehealth: Payer: Self-pay | Admitting: Internal Medicine

## 2023-06-29 NOTE — Progress Notes (Signed)
 Remote ICD transmission.

## 2023-06-29 NOTE — Addendum Note (Signed)
 Addended by: Lott Rouleau A on: 06/29/2023 11:49 AM   Modules accepted: Orders

## 2023-06-29 NOTE — Telephone Encounter (Signed)
 Pt left voicemail stating that she went to cardiology on Friday. They would like pt to be off xarelto  day before and day of procedure only. Is this OK? Pt is aware that provider is out of office until the end of May. Pt colonoscopy is scheduled for 07/14/23. Please advise. Thank you!  (Pt had stroke in eye 05/27/23)

## 2023-06-29 NOTE — Telephone Encounter (Signed)
 Patient stated she saw Dr. Marven Slimmer on 5/16 and wants to know if she still needs to see Dr. Rodolfo Clan on 7/1.

## 2023-06-30 ENCOUNTER — Telehealth: Payer: Self-pay

## 2023-06-30 DIAGNOSIS — I5022 Chronic systolic (congestive) heart failure: Secondary | ICD-10-CM

## 2023-06-30 DIAGNOSIS — I420 Dilated cardiomyopathy: Secondary | ICD-10-CM

## 2023-06-30 NOTE — Telephone Encounter (Signed)
 Appointment has been cancelled. Patient is aware.

## 2023-06-30 NOTE — Telephone Encounter (Signed)
 Called and spoke with patient to schedule CRT upgrade ICD. Patient wants to talk to her son to confirm what days he is available for her. She was advise to contact April @ ext. [phone].

## 2023-07-08 NOTE — Patient Instructions (Signed)
 [name] [name]  07/08/2023     @PREFPERIOPPHARMACY @   Your procedure is scheduled on  07/14/2023.   Report to Crist[facility] Done at [phone] A.M.   Call this number if you have problems the morning of surgery:  [phone]  If you experience any cold or flu symptoms such as cough, fever, chills, shortness of breath, etc. between now and your scheduled surgery, please notify us  at the above number.   Remember:        Your last dose of jardiance  should be on 07/10/2023.        Your last dose of xarelto  should be on 07/11/2023.        DO NOT take any medications for diabetes the morning of your procedure.    Follow the diet and prep instructions given to you by the office.   You may drink clear liquids until  0415 am on 07/14/2023.    Clear liquids allowed are:                    Water, Juice (No red color; non-citric and without pulp; diabetics please choose diet or no sugar options), Carbonated beverages (diabetics please choose diet or no sugar options), Clear Tea (No creamer, milk, or cream, including half & half and powdered creamer), Black Coffee Only (No creamer, milk or cream, including half & half and powdered creamer), and Clear Sports drink (No red color; diabetics please choose diet or no sugar options)    Take these medicines the morning of surgery with A SIP OF WATER                                           digoxin , metoprolol , entresto .    Do not wear jewelry, make-up or nail polish, including gel polish,  artificial nails, or any other type of covering on natural nails (fingers and  toes).  Do not wear lotions, powders, or perfumes, or deodorant.  Do not shave 48 hours prior to surgery.  Men may shave face and neck.  Do not bring valuables to the hospital.  [facility] is not responsible for any belongings or valuables.  Contacts, dentures or bridgework may not be worn into surgery.  Leave your suitcase in the car.  After surgery it may be brought to your  room.  For patients admitted to the hospital, discharge time will be determined by your treatment team.  Patients discharged the day of surgery will not be allowed to drive home and must have someone with them for 24 hours.    Special instructions:   DO NOT smoke tobacco or vape for 24 hours before your procedure.   Please read over the following fact sheets that you were given. Anesthesia Post-op Instructions and Care and Recovery After Surgery      Colonoscopy, Adult, Care After The following information offers guidance on how to care for yourself after your procedure. Your health care provider may also give you more specific instructions. If you have problems or questions, contact your health care provider. What can I expect after the procedure? After the procedure, it is common to have: A small amount of blood in your stool for 24 hours after the procedure. Some gas. Mild cramping or bloating of your abdomen. Follow these instructions at home: Eating and drinking  Drink enough fluid to keep  your urine pale yellow. Follow instructions from your health care provider about eating or drinking restrictions. Resume your normal diet as told by your health care provider. Avoid heavy or fried foods that are hard to digest. Activity Rest as told by your health care provider. Avoid sitting for a long time without moving. Get up to take short walks every 1-2 hours. This is important to improve blood flow and breathing. Ask for help if you feel weak or unsteady. Return to your normal activities as told by your health care provider. Ask your health care provider what activities are safe for you. Managing cramping and bloating  Try walking around when you have cramps or feel bloated. If directed, apply heat to your abdomen as told by your health care provider. Use the heat source that your health care provider recommends, such as a moist heat pack or a heating pad. Place a towel between your  skin and the heat source. Leave the heat on for 20-30 minutes. Remove the heat if your skin turns bright red. This is especially important if you are unable to feel pain, heat, or cold. You have a greater risk of getting burned. General instructions If you were given a sedative during the procedure, it can affect you for several hours. Do not drive or operate machinery until your health care provider says that it is safe. For the first 24 hours after the procedure: Do not sign important documents. Do not drink alcohol. Do your regular daily activities at a slower pace than normal. Eat soft foods that are easy to digest. Take over-the-counter and prescription medicines only as told by your health care provider. Keep all follow-up visits. This is important. Contact a health care provider if: You have blood in your stool 2-3 days after the procedure. Get help right away if: You have more than a small spotting of blood in your stool. You have large blood clots in your stool. You have swelling of your abdomen. You have nausea or vomiting. You have a fever. You have increasing pain in your abdomen that is not relieved with medicine. These symptoms may be an emergency. Get help right away. Call 911. Do not wait to see if the symptoms will go away. Do not drive yourself to the hospital. Summary After the procedure, it is common to have a small amount of blood in your stool. You may also have mild cramping and bloating of your abdomen. If you were given a sedative during the procedure, it can affect you for several hours. Do not drive or operate machinery until your health care provider says that it is safe. Get help right away if you have a lot of blood in your stool, nausea or vomiting, a fever, or increased pain in your abdomen. This information is not intended to replace advice given to you by your health care provider. Make sure you discuss any questions you have with your health care  provider. Document Revised: 03/11/2022 Document Reviewed: 09/19/2020 Elsevier Patient Education  2024 Elsevier Inc.General Anesthesia, Adult, Care After The following information offers guidance on how to care for yourself after your procedure. Your health care provider may also give you more specific instructions. If you have problems or questions, contact your health care provider. What can I expect after the procedure? After the procedure, it is common for people to: Have pain or discomfort at the IV site. Have nausea or vomiting. Have a sore throat or hoarseness. Have trouble concentrating. Feel cold or  chills. Feel weak, sleepy, or tired (fatigue). Have soreness and body aches. These can affect parts of the body that were not involved in surgery. Follow these instructions at home: For the time period you were told by your health care provider:  Rest. Do not participate in activities where you could fall or become injured. Do not drive or use machinery. Do not drink alcohol. Do not take sleeping pills or medicines that cause drowsiness. Do not make important decisions or sign legal documents. Do not take care of children on your own. General instructions Drink enough fluid to keep your urine pale yellow. If you have sleep apnea, surgery and certain medicines can increase your risk for breathing problems. Follow instructions from your health care provider about wearing your sleep device: Anytime you are sleeping, including during daytime naps. While taking prescription pain medicines, sleeping medicines, or medicines that make you drowsy. Return to your normal activities as told by your health care provider. Ask your health care provider what activities are safe for you. Take over-the-counter and prescription medicines only as told by your health care provider. Do not use any products that contain nicotine or tobacco. These products include cigarettes, chewing tobacco, and vaping  devices, such as e-cigarettes. These can delay incision healing after surgery. If you need help quitting, ask your health care provider. Contact a health care provider if: You have nausea or vomiting that does not get better with medicine. You vomit every time you eat or drink. You have pain that does not get better with medicine. You cannot urinate or have bloody urine. You develop a skin rash. You have a fever. Get help right away if: You have trouble breathing. You have chest pain. You vomit blood. These symptoms may be an emergency. Get help right away. Call 911. Do not wait to see if the symptoms will go away. Do not drive yourself to the hospital. Summary After the procedure, it is common to have a sore throat, hoarseness, nausea, vomiting, or to feel weak, sleepy, or fatigue. For the time period you were told by your health care provider, do not drive or use machinery. Get help right away if you have difficulty breathing, have chest pain, or vomit blood. These symptoms may be an emergency. This information is not intended to replace advice given to you by your health care provider. Make sure you discuss any questions you have with your health care provider. Document Revised: 04/26/2021 Document Reviewed: 04/26/2021 Elsevier Patient Education  2024 [employer].

## 2023-07-08 NOTE — Telephone Encounter (Signed)
 Pt requesting a c/b in regards to this matter. Please advise

## 2023-07-09 ENCOUNTER — Ambulatory Visit: Admitting: Cardiology

## 2023-07-09 DIAGNOSIS — E7849 Other hyperlipidemia: Secondary | ICD-10-CM | POA: Diagnosis not present

## 2023-07-09 DIAGNOSIS — R300 Dysuria: Secondary | ICD-10-CM | POA: Diagnosis not present

## 2023-07-09 DIAGNOSIS — R7301 Impaired fasting glucose: Secondary | ICD-10-CM | POA: Diagnosis not present

## 2023-07-09 DIAGNOSIS — E039 Hypothyroidism, unspecified: Secondary | ICD-10-CM | POA: Diagnosis not present

## 2023-07-09 DIAGNOSIS — N1831 Chronic kidney disease, stage 3a: Secondary | ICD-10-CM | POA: Diagnosis not present

## 2023-07-09 DIAGNOSIS — R5383 Other fatigue: Secondary | ICD-10-CM | POA: Diagnosis not present

## 2023-07-09 NOTE — Telephone Encounter (Signed)
 Pt notified and message sent to Endo

## 2023-07-09 NOTE — Telephone Encounter (Signed)
 Pt is scheduled for CRTD Upgrade with Dr. Marven Slimmer on 6/24 at 3:00 PM.   She will get updated labs done at [facility] on 6/16-17.   Instruction letter will be sent via MyChart per pt's request.

## 2023-07-09 NOTE — Telephone Encounter (Signed)
 That should be OK, please inform endo about this Thanks

## 2023-07-09 NOTE — Pre-Procedure Instructions (Signed)
  Message Received: Today Rozann Cornell, LPN  Rosalie Colony, RN; Lavell Portugal, RN; Venancio Gibney, RN Pt left voicemail stating that she went to cardiology on Friday. They would like pt to be off xarelto  day before and day of procedure only. Is this OK? Pt is aware that provider is out of office until the end of May. Pt colonoscopy is scheduled for 07/14/23. Please advise. Thank you!   (Pt had stroke in eye 05/27/23)  Dr.Castaneda states this will be fine.

## 2023-07-09 NOTE — Pre-Procedure Instructions (Signed)
 Spoke with Dr Margrette Shield about patient having stroke in her eye on 05/27/2023. Per anesthesia guidelines, patient needs to wait 6 months after the stroke to have elective procedures. Rozann Cornell, LPN, notified that patient needs to wait for TCS.

## 2023-07-10 ENCOUNTER — Encounter (HOSPITAL_COMMUNITY): Payer: Self-pay

## 2023-07-10 ENCOUNTER — Other Ambulatory Visit: Payer: Self-pay | Admitting: Internal Medicine

## 2023-07-10 ENCOUNTER — Encounter (HOSPITAL_COMMUNITY)
Admission: RE | Admit: 2023-07-10 | Discharge: 2023-07-10 | Disposition: A | Discharge status: Home / Self Care | Source: Ambulatory Visit | Attending: Gastroenterology | Admitting: Gastroenterology

## 2023-07-10 DIAGNOSIS — R7303 Prediabetes: Secondary | ICD-10-CM

## 2023-07-10 DIAGNOSIS — E559 Vitamin D deficiency, unspecified: Secondary | ICD-10-CM | POA: Diagnosis not present

## 2023-07-10 DIAGNOSIS — E1122 Type 2 diabetes mellitus with diabetic chronic kidney disease: Secondary | ICD-10-CM | POA: Diagnosis not present

## 2023-07-10 DIAGNOSIS — E782 Mixed hyperlipidemia: Secondary | ICD-10-CM | POA: Diagnosis not present

## 2023-07-10 DIAGNOSIS — I5022 Chronic systolic (congestive) heart failure: Secondary | ICD-10-CM

## 2023-07-10 NOTE — Telephone Encounter (Signed)
 Pt contacted and made aware. Pt verbalized understanding. Will place in Future Scheduling folder for October 16 or after  Urban Garden, MD  Rozann Cornell, LPN; Venancio Gibney, RN Thanks, agree with this       Previous Messages    ----- Message ----- From: Rozann Cornell, LPN Sent: 1/61/0960   2:56 PM EDT To: Urban Garden, MD   ----- Message ----- From: Venancio Gibney, RN Sent: 07/09/2023   2:50 PM EDT To: Rozann Cornell, LPN  Hey! I just spoke with Dr Margrette Shield. He said that according to anesthesia guidelines, we have to wait 6 months after any type of stroke before doing elective procedures. So Cadee Agro will need to be scheduled for at least 4 months after her 05/27/2023 eye stroke.

## 2023-07-13 DIAGNOSIS — N1831 Chronic kidney disease, stage 3a: Secondary | ICD-10-CM | POA: Diagnosis not present

## 2023-07-13 DIAGNOSIS — E782 Mixed hyperlipidemia: Secondary | ICD-10-CM | POA: Diagnosis not present

## 2023-07-13 DIAGNOSIS — E559 Vitamin D deficiency, unspecified: Secondary | ICD-10-CM | POA: Diagnosis not present

## 2023-07-13 DIAGNOSIS — Z6824 Body mass index (BMI) 24.0-24.9, adult: Secondary | ICD-10-CM | POA: Diagnosis not present

## 2023-07-13 DIAGNOSIS — D6869 Other thrombophilia: Secondary | ICD-10-CM | POA: Diagnosis not present

## 2023-07-14 ENCOUNTER — Ambulatory Visit (HOSPITAL_COMMUNITY): Admission: RE | Admit: 2023-07-14 | Source: Home / Self Care | Admitting: Gastroenterology

## 2023-07-14 ENCOUNTER — Encounter (HOSPITAL_COMMUNITY): Admission: RE | Payer: Self-pay | Source: Home / Self Care

## 2023-07-14 SURGERY — COLONOSCOPY
Anesthesia: Choice

## 2023-07-24 ENCOUNTER — Telehealth: Payer: Self-pay | Admitting: Cardiology

## 2023-07-24 NOTE — Telephone Encounter (Signed)
 Spoke with the patient and offered her a sooner date for her procedure with Dr. Marven Slimmer. Patient declined.

## 2023-07-24 NOTE — Telephone Encounter (Signed)
 Patient stated she was returning RN Carlyle's call.

## 2023-07-28 ENCOUNTER — Other Ambulatory Visit: Payer: Self-pay

## 2023-07-28 ENCOUNTER — Other Ambulatory Visit (HOSPITAL_COMMUNITY)
Admission: RE | Admit: 2023-07-28 | Discharge: 2023-07-28 | Disposition: A | Discharge status: Home / Self Care | Source: Ambulatory Visit | Attending: Cardiology | Admitting: Cardiology

## 2023-07-28 DIAGNOSIS — Z01812 Encounter for preprocedural laboratory examination: Secondary | ICD-10-CM

## 2023-07-28 LAB — CBC
HCT: 38.2 % (ref 36.0–46.0)
Hemoglobin: 12.6 g/dL (ref 12.0–15.0)
MCH: 29.9 pg (ref 26.0–34.0)
MCHC: 33 g/dL (ref 30.0–36.0)
MCV: 90.5 fL (ref 80.0–100.0)
Platelets: 116 10*3/uL — ABNORMAL LOW (ref 150–400)
RBC: 4.22 MIL/uL (ref 3.87–5.11)
RDW: 14.6 % (ref 11.5–15.5)
WBC: 6.9 10*3/uL (ref 4.0–10.5)
nRBC: 0 % (ref 0.0–0.2)

## 2023-07-28 LAB — BASIC METABOLIC PANEL WITH GFR
Anion gap: 10 (ref 5–15)
BUN: 33 mg/dL — ABNORMAL HIGH (ref 8–23)
CO2: 26 mmol/L (ref 22–32)
Calcium: 9.8 mg/dL (ref 8.9–10.3)
Chloride: 102 mmol/L (ref 98–111)
Creatinine, Ser: 1.01 mg/dL — ABNORMAL HIGH (ref 0.44–1.00)
GFR, Estimated: 59 mL/min — ABNORMAL LOW (ref >60)
Glucose, Bld: 94 mg/dL (ref 70–99)
Potassium: 4 mmol/L (ref 3.5–5.1)
Sodium: 138 mmol/L (ref 135–145)

## 2023-07-28 NOTE — Progress Notes (Signed)
 Lab called from [facility] requesting labs prior the pt's Biventricular implantable cardioverter defibrillator procedure. A BMET and CBC were ordered and released. Pt called and notified. Pt verbalized understanding. All questions if any were answered.

## 2023-07-28 NOTE — Pre-Procedure Instructions (Signed)
 Spoke with patient to discuss upcoming procedure.   Confirmed patient is scheduled for a Biventricular implantable cardioverter defibrillator on Tuesday, June 24 with Dr. Harvie Liner. Instructed patient to arrive at the Main Entrance A at [facility]: [address] [place], [place] [address] and check in at Admitting at 1230 PM.   Labs completed  Any recent signs of acute illness or been started on antibiotics?  Let us  know if you get sick between now and procedure. Any new medications started? Let us  know if you start any new medications between now and procedure. Any medications to hold? Xarelto - last dose 6/22, Jardiance -last dose 6/20 Medication instructions:  On the morning of your procedure take morning medication- except Xarelto  and Jardiance  No eating or drinking after midnight prior to procedure.   The night before your procedure and the morning of your procedure, wash thoroughly with the CHG surgical soap from the neck down, paying special attention to the area where your procedure will be performed.  Advised of plan to go home the same day and will only stay overnight if medically necessary. You MUST have a responsible adult to drive you home and MUST be with you the first 24 hours after you arrive home.  Patient verbalized understanding to all instructions provided and agreed to proceed with procedure.

## 2023-07-30 ENCOUNTER — Encounter: Payer: Self-pay | Admitting: Emergency Medicine

## 2023-08-03 NOTE — Pre-Procedure Instructions (Signed)
 Instructed patient on the following items: Arrival time 1230 Nothing to eat or drink after midnight No meds AM of procedure Responsible person to drive you home and stay with you for 24 hrs Wash with special soap night before and morning of procedure If on anti-coagulant drug instructions Xarelto - last dose 6/22

## 2023-08-04 ENCOUNTER — Encounter (HOSPITAL_COMMUNITY): Payer: Self-pay | Admitting: Cardiology

## 2023-08-04 ENCOUNTER — Encounter (HOSPITAL_COMMUNITY)
Admission: RE | Disposition: A | Discharge status: Home / Self Care | Payer: Self-pay | Source: Home / Self Care | Attending: Cardiology

## 2023-08-04 ENCOUNTER — Ambulatory Visit (HOSPITAL_COMMUNITY)
Admission: RE | Admit: 2023-08-04 | Discharge: 2023-08-04 | Disposition: A | Discharge status: Home / Self Care | Attending: Cardiology | Admitting: Cardiology

## 2023-08-04 DIAGNOSIS — Z4502 Encounter for adjustment and management of automatic implantable cardiac defibrillator: Secondary | ICD-10-CM | POA: Diagnosis not present

## 2023-08-04 DIAGNOSIS — Z853 Personal history of malignant neoplasm of breast: Secondary | ICD-10-CM | POA: Diagnosis not present

## 2023-08-04 DIAGNOSIS — I429 Cardiomyopathy, unspecified: Secondary | ICD-10-CM | POA: Insufficient documentation

## 2023-08-04 DIAGNOSIS — I447 Left bundle-branch block, unspecified: Secondary | ICD-10-CM | POA: Insufficient documentation

## 2023-08-04 DIAGNOSIS — Z8673 Personal history of transient ischemic attack (TIA), and cerebral infarction without residual deficits: Secondary | ICD-10-CM | POA: Diagnosis not present

## 2023-08-04 DIAGNOSIS — I5022 Chronic systolic (congestive) heart failure: Secondary | ICD-10-CM | POA: Diagnosis not present

## 2023-08-04 DIAGNOSIS — Z7901 Long term (current) use of anticoagulants: Secondary | ICD-10-CM | POA: Diagnosis not present

## 2023-08-04 HISTORY — PX: BIV UPGRADE: EP1202

## 2023-08-04 HISTORY — PX: LEAD INSERTION: EP1212

## 2023-08-04 LAB — GLUCOSE, CAPILLARY: Glucose-Capillary: 111 mg/dL — ABNORMAL HIGH (ref 70–99)

## 2023-08-04 SURGERY — BIV UPGRADE
Anesthesia: LOCAL

## 2023-08-04 MED ORDER — CHLORHEXIDINE GLUCONATE 4 % EX SOLN
4.0000 | Freq: Once | CUTANEOUS | Status: DC
  Filled 2023-08-04: qty 60

## 2023-08-04 MED ORDER — LIDOCAINE HCL (PF) 1 % IJ SOLN
INTRAMUSCULAR | Status: AC
  Filled 2023-08-04: qty 90

## 2023-08-04 MED ORDER — CEFAZOLIN SODIUM-DEXTROSE 2-4 GM/100ML-% IV SOLN
INTRAVENOUS | Status: DC
Start: 2023-08-04 — End: 2023-08-04
  Filled 2023-08-04: qty 100

## 2023-08-04 MED ORDER — CEFAZOLIN SODIUM-DEXTROSE 2-4 GM/100ML-% IV SOLN: 2.0000 g | INTRAVENOUS | Status: DC

## 2023-08-04 MED ORDER — FENTANYL CITRATE (PF) 100 MCG/2ML IJ SOLN
INTRAMUSCULAR | Status: AC
  Filled 2023-08-04: qty 2

## 2023-08-04 MED ORDER — SODIUM CHLORIDE 0.9 % IV SOLN: 80.0000 mg | INTRAVENOUS | Status: DC

## 2023-08-04 MED ORDER — MIDAZOLAM HCL 2 MG/2ML IJ SOLN
INTRAMUSCULAR | Status: AC
  Filled 2023-08-04: qty 2

## 2023-08-04 MED ORDER — SODIUM CHLORIDE 0.9 % IV SOLN
INTRAVENOUS | Status: DC
Start: 2023-08-04 — End: 2023-08-04

## 2023-08-04 MED ORDER — IOHEXOL 350 MG/ML SOLN
INTRAVENOUS | Status: DC | PRN
  Administered 2023-08-04: 25 mL
  Administered 2023-08-04: 10 mL

## 2023-08-04 MED ORDER — POVIDONE-IODINE 10 % EX SWAB
2.0000 | Freq: Once | CUTANEOUS | Status: AC
  Administered 2023-08-04: 2 via TOPICAL

## 2023-08-04 MED ORDER — SODIUM CHLORIDE 0.9 % IV SOLN
INTRAVENOUS | Status: AC
  Filled 2023-08-04: qty 2

## 2023-08-04 SURGICAL SUPPLY — 1 items: PAD DEFIB RADIO PHYSIO CONN (PAD) ×1 IMPLANT

## 2023-08-04 NOTE — H&P (Signed)
 Electrophysiology Office Follow up Visit Note:     Date:  08/04/2023    ID:  [name] [name], DOB [birth year], MRN [id number]   PCP:  [name] Jerel MATSU, MD      [facility] Cardiologist:  [name] Schilling, MD  [facility] Electrophysiologist:  [name] Sage, MD      Interval History:       [name] [name] is a 73 y.o. female who presents for a follow up visit.    The patient was previously followed by Dr. Sage.  She has an ICD for primary prevention given a history of chronic systolic heart failure secondary to LAMIN gene mutation.  She had a generator replacement in 2017.  Thanks she has a history of LV thrombus on Coumadin .  She has a history of stroke and breast cancer.   She is doing okay today.  She has no residual visual deficit after her recent amaurosis fugax episode.  Presents for CRT upgrade. Procedure reviewed.     Objective Past medical, surgical, social and family history were reviewed.   ROS:   Please see the history of present illness.    All other systems reviewed and are negative.   EKGs/Labs/Other Studies Reviewed:     The following studies were reviewed today:   Jun 26, 2023 in-clinic device interrogation personally reviewed Battery longevity 2.9 years Lead parameter stable Atrial pacing 16% Ventricular pacing less than 1% Atrial fibrillation burden 0% No high-voltage therapies   Jun 12, 2023 echo EF 30-35 RV normal Mildly dilated left atrium Moderate MR Moderate PI   June 02, 2023 EKG shows atrial paced, ventricular sensed rhythm.  Left bundle branch block with a QRS duration of 146 ms   February 20, 2019 chest x-ray shows dual-chamber, single coil ICD in the left chest.         Physical Exam:     VS:  BP 116/69   Pulse 62   Ht 5' 8 (1.727 m)   Wt 154 lb (69.9 kg)   SpO2 98%   BMI 23.42 kg/m         Wt Readings from Last 3 Encounters:  06/26/23 154 lb (69.9 kg)  06/11/23 136 lb (61.7 kg)  06/02/23 155 lb (70.3 kg)       GEN: no distress CARD: RRR, No MRG.  CIED pocket well-healed RESP: No IWOB. CTAB.     Assessment ASSESSMENT:     1. ICD (implantable cardioverter-defibrillator), dual, in situ   2. Chronic systolic HF (heart failure) (HCC)   3. Left bundle branch block     PLAN:     In order of problems listed above:   #Chronic systolic heart failure NYHA class II-III.  EF 30 to 35%.  Has a dual coil ICD in situ.  She has a left bundle branch block.  I discussed the role of CRT in treating patients with left bundle branch block and systolic heart failure.  She is interested in proceeding with upgrade procedure.  We will need to make sure the vein is open prior to opening the pocket.   Risks, benefits, alternatives to CRT-D upgrade were discussed in detail with the patient today. The patient understands that the risks include but are not limited to bleeding, infection, pneumothorax, perforation, tamponade, vascular damage, renal failure, MI, stroke, death, and lead dislodgement and wishes to proceed.  We will therefore schedule device implantation at the next available time.   She will need to hold her Xarelto  for 24  hours prior to the procedure to minimize the risks of bleeding.  The pros/cons and risks of holding anticoagulation given her history of LV thrombus were discussed in detail during today's visit.   #History of amaurosis fugax Continue Xarelto .   Presents for CRT upgrade. Procedure reviewed.   Signed, [name] Holts, MD, [name] [name], [name] [name] 08/04/2023 Electrophysiology [facility]

## 2023-08-04 NOTE — Discharge Instructions (Signed)
 CALL DR LAMBERT'S OFFICE IF ANY PROBLEMS, QUESTIONS OR CONCERNS; CALL IF ANY SIGNS OF INFECTION AT EITHER IV SITE; REDNESS, DRAINAGE, FEVER, PAIN OR SWELLING

## 2023-08-04 NOTE — Progress Notes (Signed)
 Per Cindie, MD patient needs an IV in the L arm despite having L arm restriction. IV placed in L hand.

## 2023-08-05 NOTE — Telephone Encounter (Signed)
 Pt is requesting a callback from nurse Carlyle regarding her wanting to discuss other options for a procedure date.

## 2023-08-05 NOTE — Telephone Encounter (Signed)
 Spoke with the patient who states that Dr. Cindie was unable to complete her procedure yesterday due to vein being occluded. She would like to know when she can see Dr. Cindie to discuss next steps. She is already scheduled for a follow up appointment on 7/16 at 2:45pm. Patient confirms that she will be there for her appointment.

## 2023-08-10 DIAGNOSIS — E559 Vitamin D deficiency, unspecified: Secondary | ICD-10-CM | POA: Diagnosis not present

## 2023-08-10 DIAGNOSIS — I5022 Chronic systolic (congestive) heart failure: Secondary | ICD-10-CM | POA: Diagnosis not present

## 2023-08-10 DIAGNOSIS — E782 Mixed hyperlipidemia: Secondary | ICD-10-CM | POA: Diagnosis not present

## 2023-08-10 DIAGNOSIS — E1122 Type 2 diabetes mellitus with diabetic chronic kidney disease: Secondary | ICD-10-CM | POA: Diagnosis not present

## 2023-08-11 ENCOUNTER — Encounter: Admitting: Internal Medicine

## 2023-08-18 ENCOUNTER — Ambulatory Visit

## 2023-08-18 DIAGNOSIS — C50919 Malignant neoplasm of unspecified site of unspecified female breast: Secondary | ICD-10-CM | POA: Diagnosis not present

## 2023-08-20 ENCOUNTER — Ambulatory Visit: Payer: Medicare Other

## 2023-08-20 DIAGNOSIS — I420 Dilated cardiomyopathy: Secondary | ICD-10-CM

## 2023-08-21 LAB — CUP PACEART REMOTE DEVICE CHECK
Battery Remaining Longevity: 30 mo
Battery Remaining Percentage: 31 %
Battery Voltage: 2.86 V
Brady Statistic AP VP Percent: 1 %
Brady Statistic AP VS Percent: 21 %
Brady Statistic AS VP Percent: 1 %
Brady Statistic AS VS Percent: 78 %
Brady Statistic RA Percent Paced: 19 %
Brady Statistic RV Percent Paced: 1 %
Date Time Interrogation Session: 20250710040026
HighPow Impedance: 61 Ohm
HighPow Impedance: 61 Ohm
Implantable Lead Connection Status: 753985
Implantable Lead Connection Status: 753985
Implantable Lead Implant Date: 20100416
Implantable Lead Implant Date: 20100416
Implantable Lead Location: 753859
Implantable Lead Location: 753860
Implantable Lead Model: 7122
Implantable Pulse Generator Implant Date: 20170626
Lead Channel Impedance Value: 380 Ohm
Lead Channel Impedance Value: 400 Ohm
Lead Channel Pacing Threshold Amplitude: 0.25 V
Lead Channel Pacing Threshold Amplitude: 1 V
Lead Channel Pacing Threshold Pulse Width: 0.5 ms
Lead Channel Pacing Threshold Pulse Width: 0.5 ms
Lead Channel Sensing Intrinsic Amplitude: 0.7 mV
Lead Channel Sensing Intrinsic Amplitude: 12 mV
Lead Channel Setting Pacing Amplitude: 2 V
Lead Channel Setting Pacing Amplitude: 2.5 V
Lead Channel Setting Pacing Pulse Width: 0.5 ms
Lead Channel Setting Sensing Sensitivity: 0.5 mV
Pulse Gen Serial Number: 7370130

## 2023-08-22 ENCOUNTER — Ambulatory Visit: Payer: Self-pay | Admitting: Cardiology

## 2023-08-23 NOTE — Progress Notes (Unsigned)
 Cardiology Office Note:   Date:  08/23/2023  ID:  [name] [name], DOB 1951/01/29, MRN [id number] PCP: [name] Jerel MATSU, MD  [facility] Providers Cardiologist:  Lynwood Schilling, MD Electrophysiologist:  Elspeth Sage, MD {  History of Present Illness:   [name] [name] is a 73 y.o. female who presents for follow up of cardiomyopathy.  She has a history of a nonischemic cardiomyopathy. Her ejection fraction was at one point in time 15 - 20%. A later ejection fraction was about 40% in 2015.     I sent her for a CPX in 2018 which demonstrated a good VO2 Max.  EF in 2023 was 35 - 40%. She has had an episode of amaurosis fugax.  Carotid doppler was unremarkable.  C reactive protein was upper limites of normal.  Sed rate was upper limits but down from previous.   She was on a lower dose of Xarelto  for previous LV clot.  I increased the dose after this .  I saw her after repeat echo that demonstrated the EF to be 30 - 35%.  I sent her to Dr. Cindie to talk about BiV upgrade.  I also referred her to neuro ophthalmology at [facility].     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  She is going to follow up to discuss epicardial lead implant vs. Right sided attempt with tunneling of lead to left chest.  ***    I called her to discuss these results.  Her EF might be slightly lower than before.   She is on the GDMT that she tolerates.   I talked to her about upgrade to BiV pacing.  She had previously talked to Dr. Sage about this.  She would like to consider this and would want to do this before she moves to [place]  in the future.  As Dr. Sage is out I will send her to Dr. Cindie to discuss.  I do not see a source of clot to see an etiology for her amaurosis fugax.  I have made a referral for a neuro ophthalmologist.  I am not sure that a brain MRI will add anything and if this is ordered I would suggest it be ordered by  that physician at [facility].  Other work up has been negative.   Call [name] with the results and send results to [name] Jerel MATSU, MD     1. Left ventricular ejection fraction, by estimation, is 30 to 35%. The  left ventricle has moderately decreased function. The left ventricle  demonstrates global hypokinesis. The left ventricular internal cavity size  was moderately dilated. Left  ventricular diastolic parameters are consistent with Grade I diastolic  dysfunction (impaired relaxation).   2. Right ventricular systolic function is normal. The right ventricular  size is normal. There is normal pulmonary artery systolic pressure. The  estimated right ventricular systolic pressure is 24.0 mmHg.   3. Left atrial size was mildly dilated.   4. The mitral valve is normal in structure. Moderate mitral valve  regurgitation. No evidence of mitral stenosis.   5. The aortic valve is tricuspid. Aortic valve regurgitation is not  visualized. No aortic stenosis is present.   6. Pulmonic valve regurgitation is moderate.   7. The inferior vena cava is dilated in size with >50% respiratory  variability, suggesting right atrial pressure of 8 mmHg.    ***   She reports that the  day that this happened she was sitting getting her nails done.  She had to have a classic description of amaurosis fugax.  She has not had any symptoms prior to this.  She was not having any palpitations, presyncope or syncope.  She does not have any numbness or tingling.  She had no loss of motor or voice.  She has since done well.  She never had this happen before.  She is otherwise been doing well and doing her walking without symptoms.  ROS: ***    EKG:       ***  Risk Assessment/Calculations:   {Does this patient have ATRIAL FIBRILLATION?:[id number]} No BP recorded.  {Refresh Note OR Click here to enter BP  :1}***        Physical Exam:   VS:  There were no vitals taken for this visit.   Wt Readings from Last 3  Encounters:  08/04/23 157 lb (71.2 kg)  06/26/23 154 lb (69.9 kg)  06/11/23 136 lb (61.7 kg)     GEN: Well nourished, well developed in no acute distress NECK: No JVD; No carotid bruits CARDIAC: ***RR, *** murmurs, rubs, gallops RESPIRATORY:  Clear to auscultation without rales, wheezing or rhonchi  ABDOMEN: Soft, non-tender, non-distended EXTREMITIES:  No edema; No deformity   ASSESSMENT AND PLAN:   CARDIOMYOPATHY, PRIMARY, DILATED:   ***  She tolerating current meds.  She does not want to consider BiV upgrade.  She has class I symptoms.  Blood pressure would not allow med titration.  Continue as listed.  ICD :   She is up-to-date with follow-up.  ***  I checked the April interrogation and there were no significant dysrhythmias.  She has follow-up in EP early summer.    LV CLOT:   ***  When I saw her briefly in the office the other day I increased her Xarelto  to 20 mg daily.  Echocardiogram is scheduled for tomorrow.    AMAOURSIS FUGAX:  ***   Carotid has been unremarkable.  Inflammatory markers have been nondiagnostic.  Echo is pending as above.  I will consider an MRI if all of this is normal.  I also like to refer her to neuro-ophthalmology.  Suspect that if this might have been embolic I have increased her Xarelto .  We are also treating for progressive risk reduction with lipid management.         Follow up ***  Signed, Lynwood Schilling, MD

## 2023-08-25 ENCOUNTER — Encounter: Payer: Self-pay | Admitting: Cardiology

## 2023-08-25 ENCOUNTER — Ambulatory Visit: Attending: Cardiology | Admitting: Cardiology

## 2023-08-25 VITALS — BP 88/50 | HR 72 | Ht 68.0 in | Wt 157.0 lb

## 2023-08-25 DIAGNOSIS — R892 Abnormal level of other drugs, medicaments and biological substances in specimens from other organs, systems and tissues: Secondary | ICD-10-CM | POA: Diagnosis not present

## 2023-08-25 MED ORDER — METOPROLOL SUCCINATE ER 25 MG PO TB24: ORAL_TABLET | ORAL | 3 refills | Status: AC

## 2023-08-25 NOTE — Patient Instructions (Signed)
 Medication Instructions:  Decrease Metoprolol  Succinate to 25 mg in the morning and continue taking 50 mg at night *If you need a refill on your cardiac medications before your next appointment, please call your pharmacy*  Lab Work: Digoxin  level today If you have labs (blood work) drawn today and your tests are completely normal, you will receive your results only by: MyChart Message (if you have MyChart) OR A paper copy in the mail If you have any lab test that is abnormal or we need to change your treatment, we will call you to review the results.  Testing/Procedures: NONE  Follow-Up: At [facility], you and your health needs are our priority.  As part of our continuing mission to provide you with exceptional heart care, our providers are all part of one team.  This team includes your primary Cardiologist (physician) and Advanced Practice Providers or APPs (Physician Assistants and Nurse Practitioners) who all work together to provide you with the care you need, when you need it.  Your next appointment:   3 month(s)  Provider:   [place], MD  We recommend signing up for the patient portal called MyChart.  Sign up information is provided on this After Visit Summary.  MyChart is used to connect with patients for Virtual Visits (Telemedicine).  Patients are able to view lab/test results, encounter notes, upcoming appointments, etc.  Non-urgent messages can be sent to your provider as well.   To learn more about what you can do with MyChart, go to [email or web address].   Other Instructions You have been referred to Dr. Donna Eden [phone]). Someone will reach out to you to make that appointment.

## 2023-08-26 ENCOUNTER — Ambulatory Visit: Payer: Self-pay | Admitting: Cardiology

## 2023-08-26 ENCOUNTER — Encounter: Payer: Self-pay | Admitting: Cardiology

## 2023-08-26 ENCOUNTER — Ambulatory Visit: Attending: Cardiology | Admitting: Cardiology

## 2023-08-26 VITALS — BP 106/60 | HR 65 | Ht 68.0 in | Wt 159.0 lb

## 2023-08-26 DIAGNOSIS — I5022 Chronic systolic (congestive) heart failure: Secondary | ICD-10-CM | POA: Diagnosis not present

## 2023-08-26 DIAGNOSIS — Z95810 Presence of automatic (implantable) cardiac defibrillator: Secondary | ICD-10-CM | POA: Diagnosis not present

## 2023-08-26 DIAGNOSIS — I447 Left bundle-branch block, unspecified: Secondary | ICD-10-CM | POA: Diagnosis not present

## 2023-08-26 DIAGNOSIS — G453 Amaurosis fugax: Secondary | ICD-10-CM | POA: Diagnosis not present

## 2023-08-26 LAB — DIGOXIN LEVEL: Digoxin, Serum: 0.4 ng/mL — ABNORMAL LOW (ref 0.5–0.9)

## 2023-08-26 NOTE — Patient Instructions (Addendum)
 Medication Instructions:  Your physician recommends that you continue on your current medications as directed. Please refer to the Current Medication list given to you today.  *If you need a refill on your cardiac medications before your next appointment, please call your pharmacy*  Lab Work: None ordered.  You may go to any [facility] Location for your lab work:  [place] - [address] ([facility]) - [address] [phone] [address]  [place] - [address]   [place]  - [address]   [place] - [address] - [address] [employer]  - [address] ([facility]   If you have labs (blood work) drawn today and your tests are completely normal, you will receive your results only by: [employer] (if you have MyChart)  If you have any lab test that is abnormal or we need to change your treatment, we will call you or send a MyChart message to review the results.  Testing/Procedures: None ordered.  Follow-Up: At [facility], you and your health needs are our priority.  As part of our continuing mission to provide you with exceptional heart care, we have created designated Provider Care Teams.  These Care Teams include your primary Cardiologist (physician) and Advanced Practice Providers (APPs -  Physician Assistants and Nurse Practitioners) who all work together to provide you with the care you need, when you need it.  We recommend signing up for the patient portal called MyChart.  Sign up information is provided on this After Visit Summary.  MyChart is used to connect with patients for Virtual Visits (Telemedicine).  Patients are able to view lab/test results, encounter notes, upcoming appointments, etc.  Non-urgent messages can be sent to your provider as well.   To learn more about what you can do with MyChart, go to  [email or web address].    Your next appointment:   Referral to Dr Lucas. Their office will call you.  6 months  The format for your next appointment:   In Person  Provider:   Ole Holts, MD{or one of the following Advanced Practice Providers on your designated Care Team:   Charlies Arthur, [place] Ozell Jodie Passey, [place] Leotis Barrack, NP  Note: Remote monitoring is used to monitor your Pacemaker/ ICD from home. This monitoring reduces the number of office visits required to check your device to one time per year. It allows us  to keep an eye on the functioning of your device to ensure it is working properly.

## 2023-08-26 NOTE — Progress Notes (Signed)
  Electrophysiology Office Follow up Visit Note:    Date:  08/26/2023   ID:  [name] [name], DOB 03/08/1950, MRN [id number]  PCP:  [name] Jerel MATSU, MD  [facility] Cardiologist:  [name] Schilling, MD  [facility] Electrophysiologist:  [name] Sage, MD    Interval History:     [name] [name] is a 73 y.o. female who presents for a follow up visit.   She recently presented for upgrade to CRT on 08/04/2023. Venogram at that time demonstrated an occluded left subclavian vein and axillary vein. She presents today to discuss alternative options for CRT upgrade including epicardial lead implant or right sided implant with a tunneled lead.  She has been doing okay since I last saw her.        Past medical, surgical, social and family history were reviewed.  ROS:   Please see the history of present illness.    All other systems reviewed and are negative.  EKGs/Labs/Other Studies Reviewed:    The following studies were reviewed today:   06/12/2023 Echo - EF 30-35  06/02/2022 ECG - atrial paced, QRS duration  08/26/2023 device interrogation personally reviewed Battery longevity 2.5 years Lead parameter stable Atrial pacing 18% Ventricular pacing less than 1%       Physical Exam:    VS:  BP 106/60   Pulse 65   Ht 5' 8 (1.727 m)   Wt 159 lb (72.1 kg)   SpO2 97%   BMI 24.18 kg/m     Wt Readings from Last 3 Encounters:  08/26/23 159 lb (72.1 kg)  08/25/23 157 lb (71.2 kg)  08/04/23 157 lb (71.2 kg)     GEN: no distress CARD: RRR, No MRG RESP: No IWOB. CTAB.      ASSESSMENT:    1. ICD (implantable cardioverter-defibrillator), dual, in situ   2. Left bundle branch block   3. Chronic systolic HF (heart failure) (HCC)   4. Amaurosis fugax    PLAN:    In order of problems listed above:  #chronic systolic heart failure #LBBB #LAMIN cardiomyopathy #hx of LV thrombus Persistently reduced LV function despite GDMT. L subclavian/axillary vein  occluded on venogram. I have discussed alternatives to traditional CRT upgrade including extraction of a lead and upgrade via the left axillary system, right sided implant with tunneling of lead to L chest or epicardial lead implant. I am concerned based on the venogram that the R vein may not be fully patent and have recommended an epicardial lead implant. I will refer to Dr [name] [name] with CTS.  #Hx of amaurosis fugax #Hx of lv thrombus LV thrombus resolved on most recent echo Cont xarelto      Signed, [name] Holts, MD, [facility], [facility] 08/26/2023 3:14 PM    Electrophysiology [facility]

## 2023-08-28 ENCOUNTER — Telehealth: Payer: Self-pay

## 2023-08-28 NOTE — Telephone Encounter (Signed)
 Spoke with pt regarding a referral to a neuro ophthalmologist. Received fax from [facility] denying referral. Pt aware. Fax uploaded to chart. Pt verbalized understanding. All questions if any were answered.

## 2023-09-02 DIAGNOSIS — G453 Amaurosis fugax: Secondary | ICD-10-CM | POA: Diagnosis not present

## 2023-09-09 ENCOUNTER — Ambulatory Visit: Attending: Surgery | Admitting: Surgery

## 2023-09-09 ENCOUNTER — Encounter: Payer: Self-pay | Admitting: Surgery

## 2023-09-09 VITALS — BP 95/53 | HR 70 | Resp 20 | Ht 68.0 in | Wt 157.0 lb

## 2023-09-09 DIAGNOSIS — I5022 Chronic systolic (congestive) heart failure: Secondary | ICD-10-CM | POA: Diagnosis not present

## 2023-09-09 NOTE — Progress Notes (Signed)
 [address] [place] [address]             [phone]     Cardiothoracic Surgery Consultation  PCP is [name], MD Referring Provider is Cindie Ole DASEN, MD  Chief Complaint  Patient presents with   Congestive Heart Failure    Consult    HPI:  The patient is a 73 year old woman with a history of familial nonischemic cardiomyopathy, bilateral mastectomy, amaurosis fugax on Xarelto  for previous LV thrombus who has an ICD in place.  She is on GDMT with a left ventricular ejection fraction of 30 to 35% with global hypokinesis.  There is moderate mitral regurgitation.  Right ventricular systolic function is normal.  She has developed exertional fatigue and shortness of breath as well as lower extremity edema and was evaluated by Dr. Cindie for BiV upgrade.  An upper extremity venogram showed an occluded left subclavian and axillary vein.  It was felt that the alternatives would be epicardial lead implant on the LV versus a right subclavian vein coronary sinus lead with tunneling to the left chest.  Dr. Cindie was concerned about the right sided veins and felt that she should be evaluated for epicardial lead implant.  She is here today with her husband.  She had 1 brother with cardiomyopathy requiring cardiac transplant and 1 brother requiring LVAD for cardiomyopathy.  Past Medical History:  Diagnosis Date   AICD (automatic cardioverter/defibrillator) present    Arthritis    thumbs   Breast cancer (HCC)    right   Cerebrovascular disease    s/p right MCA cardioemoblic infarct 8/71/89   CHF (congestive heart failure) (HCC)    Nonischemic cardiomyopathy (HCC)    a. +LMNA mutation b. s/p STJ dual chamber ICD   Pre-diabetes    Presence of permanent cardiac pacemaker    Stroke (HCC) 02/2008   no deficits    Past Surgical History:  Procedure Laterality Date   ABDOMINAL HYSTERECTOMY     BIOPSY  09/09/2022   Procedure: BIOPSY;  Surgeon:  Eartha Angelia Toribio, MD;  [facility]: [facility];  Service: Gastroenterology;;   JUVENTINO LLANO N/A 08/04/2023   Procedure: BIV LLANO;  Surgeon: Cindie Ole DASEN, MD;  Location: [facility] INV[facility] CV LAB;  Service: Cardiovascular;  Laterality: N/A;   BREAST IMPLANT REMOVAL Right 05/31/2015   Procedure: REMOVAL RIGHT  BREAST IMPLANTS;  Surgeon: Alm Sick, MD;  Location: [facility] OR;  Service: Plastics;  Laterality: Right;   BREAST RECONSTRUCTION Right 1995   BREAST RECONSTRUCTION Right 05/31/2015   Procedure: RIGHT BREAST RECONSTRUCTION WITH SALINE IMPLANTS ;  Surgeon: Alm Sick, MD;  Location: [facility] OR;  Service: Plastics;  Laterality: Right;   CARDIAC DEFIBRILLATOR PLACEMENT  2010   STJ dual chamber ICD implanted for NICM by Dr Fernande   CARDIAC DEFIBRILLATOR PLACEMENT  05/26/2008   CATARACT EXTRACTION W/PHACO Right 03/19/2018   Procedure: CATARACT EXTRACTION PHACO AND INTRAOCULAR LENS PLACEMENT (IOC);  Surgeon: Harrie Agent, MD;  Location: [facility] ORS;  Service: Ophthalmology;  Laterality: Right;  CDE: 5.42   CATARACT EXTRACTION W/PHACO Left 10/01/2018   Procedure: CATARACT EXTRACTION PHACO AND INTRAOCULAR LENS PLACEMENT (IOC);  Surgeon: Harrie Agent, MD;  Location: [facility];  Service: Ophthalmology;  Laterality: Left;  left, CDE: 6.06   CHOLECYSTECTOMY     COLONOSCOPY     COLONOSCOPY WITH PROPOFOL  N/A 09/09/2022   Procedure: COLONOSCOPY WITH PROPOFOL ;  Surgeon: Eartha Angelia Toribio, MD;  [facility];  Service: Gastroenterology;  Laterality: N/A;  8:30am;asa 3, moved to 2:15 per Tanya   EP IMPLANTABLE DEVICE N/A 08/06/2015   Procedure: ICD Generator Changeout;  Surgeon: Elspeth JAYSON Sage, MD;  Location: [facility] INVASIVE CV LAB;  Service: Cardiovascular;  Laterality: N/A;   ESOPHAGOGASTRODUODENOSCOPY     LEAD INSERTION N/A 08/04/2023   Procedure: LEAD INSERTION;  Surgeon: Cindie Ole DASEN, MD;  Location: [facility];  Service: Cardiovascular;  Laterality: N/A;   MASTECTOMY Right     MASTECTOMY W/ SENTINEL NODE BIOPSY Left 04/05/2021   Procedure: LEFT MASTECTOMY;  Surgeon: Vernetta Berg, MD;  Location: [facility] OR;  Service: General;  Laterality: Left;   MASTOPEXY Left 05/31/2015   Procedure: LEFT BREAST MASTOPEXY;  Surgeon: Alm Sick, MD;  Location: [facility] OR;  Service: Plastics;  Laterality: Left;   PACEMAKER INSERTION  05/2008   POLYPECTOMY  09/09/2022   Procedure: POLYPECTOMY;  Surgeon: Eartha Angelia Sieving, MD;  [facility];  Service: Gastroenterology;;   SENTINEL NODE BIOPSY Left 04/05/2021   Procedure: SENTINEL LYMPH NODE BIOPSY;  Surgeon: Vernetta Berg, MD;  Location: [facility] OR;  Service: General;  Laterality: Left;   SUBMUCOSAL INJECTION  09/09/2022   Procedure: SUBMUCOSAL INJECTION;  Surgeon: Eartha Angelia Sieving, MD;  [facility];  Service: Gastroenterology;;   ROBLEY MEYER INJECTION  09/09/2022   Procedure: SUBMUCOSAL LIFTING INJECTION;  Surgeon: Eartha Angelia Sieving, MD;  [facility];  Service: Gastroenterology;;   TONSILLECTOMY     VARICOSE VEIN SURGERY Left    leg    Family History  Problem Relation Age of Onset   Cardiomyopathy Other        family hx of   Other Brother        s/p heart transplant   Stroke Other        family hx of    Social History Social History   Tobacco Use   Smoking status: Never   Smokeless tobacco: Never  Vaping Use   Vaping status: Never Used  Substance Use Topics   Alcohol use: No   Drug use: No    Current Outpatient Medications  Medication Sig Dispense Refill   ACCU-CHEK GUIDE TEST test strip daily.     Accu-Chek Softclix Lancets lancets daily.     anastrozole (ARIMIDEX) 1 MG tablet Take 1 mg by mouth in the morning.     atorvastatin  (LIPITOR) 20 MG tablet Take 1 tablet (20 mg total) by mouth daily. (Patient taking differently: Take 20 mg by mouth every evening.) 30 tablet 2   cholecalciferol (VITAMIN D3) 25 MCG (1000 UNIT) tablet Take 1,000 Units by mouth  daily.     Collagen-Vitamin C-Biotin (COLLAGEN PO) Take 1 Package by mouth daily.     digoxin  (LANOXIN ) 0.125 MG tablet Take 0.5 tablets (0.0625 mg total) by mouth daily. 45 tablet 3   empagliflozin  (JARDIANCE ) 10 MG TABS tablet Take 1 tablet (10 mg total) by mouth daily before breakfast. 90 tablet 3   furosemide  (LASIX ) 80 MG tablet Take 40-60 mg by mouth every other day.     metoprolol  succinate (TOPROL  XL) 25 MG 24 hr tablet Take 1 tablet (25 mg total) by mouth in the morning AND 2 tablets (50 mg total) at bedtime. 270 tablet 3   Multiple Vitamin (MULTIVITAMIN WITH MINERALS) TABS tablet Take 1 tablet by mouth in the morning.     rivaroxaban  (XARELTO ) 20 MG TABS tablet Take 1 tablet (20 mg total) by mouth daily with supper. 90  tablet 3   sacubitril -valsartan  (ENTRESTO ) 97-103 MG Take 1 tablet by mouth 2 (two) times daily.     spironolactone  (ALDACTONE ) 25 MG tablet TAKE 1/2 TABLET BY MOUTH ONCE DAILY 45 tablet 3   No current facility-administered medications for this visit.    No Known Allergies  Review of Systems  Constitutional:  Positive for activity change and fatigue.  HENT: Negative.    Eyes: Negative.   Respiratory:  Positive for shortness of breath.   Cardiovascular:  Positive for leg swelling. Negative for chest pain.  Gastrointestinal: Negative.   Endocrine: Negative.   Genitourinary: Negative.   Musculoskeletal: Negative.   Skin: Negative.   Allergic/Immunologic: Negative.   Neurological:  Positive for dizziness.  Hematological: Negative.   Psychiatric/Behavioral: Negative.      BP (!) 95/53   Pulse 70   Resp 20   Ht 5' 8 (1.727 m)   Wt 157 lb (71.2 kg)   SpO2 96% Comment: RA  BMI 23.87 kg/m  Physical Exam Constitutional:      Appearance: Normal appearance. She is normal weight.  HENT:     Head: Normocephalic and atraumatic.  Eyes:     Extraocular Movements: Extraocular movements intact.     Conjunctiva/sclera: Conjunctivae normal.     Pupils: Pupils are  equal, round, and reactive to light.  Neck:     Vascular: No carotid bruit.  Cardiovascular:     Rate and Rhythm: Normal rate and regular rhythm.     Pulses: Normal pulses.     Heart sounds: Normal heart sounds. No murmur heard. Pulmonary:     Effort: Pulmonary effort is normal.     Breath sounds: Normal breath sounds.  Musculoskeletal:        General: Swelling present.  Skin:    General: Skin is warm and dry.  Neurological:     General: No focal deficit present.     Mental Status: She is alert and oriented to person, place, and time.  Psychiatric:        Mood and Affect: Mood normal.        Behavior: Behavior normal.     Diagnostic Tests:  ECHOCARDIOGRAM REPORT       Patient Name:   [name] [name] Date of Exam: 06/12/2023  Medical Rec #:  [id number]         Height:       68.0 in  Accession #:    [id number]        Weight:       136.0 lb  Date of Birth:  1951-02-01         BSA:          1.735 m  Patient Age:    73 years          BP:           94/54 mmHg  Patient Gender: F                 HR:           54 bpm.  Exam Location:  [address]   Procedure: 2D Echo, Cardiac Doppler and Color Doppler (Both Spectral and  Color            Flow Doppler were utilized during procedure).   Indications:    G45.3 Amaurosis Fugax    History:        Patient has prior history of Echocardiogram examinations,  most  recent 03/17/2021. CHF, Defibrillator; Stroke. Nonischemic                  cardiomyopathy.    Sonographer:    Carl Rodgers-Jones RDCS  Referring Phys: [id number] JAMES HOCHREIN   IMPRESSIONS     1. Left ventricular ejection fraction, by estimation, is 30 to 35%. The  left ventricle has moderately decreased function. The left ventricle  demonstrates global hypokinesis. The left ventricular internal cavity size  was moderately dilated. Left  ventricular diastolic parameters are consistent with Grade I diastolic  dysfunction (impaired relaxation).   2.  Right ventricular systolic function is normal. The right ventricular  size is normal. There is normal pulmonary artery systolic pressure. The  estimated right ventricular systolic pressure is 24.0 mmHg.   3. Left atrial size was mildly dilated.   4. The mitral valve is normal in structure. Moderate mitral valve  regurgitation. No evidence of mitral stenosis.   5. The aortic valve is tricuspid. Aortic valve regurgitation is not  visualized. No aortic stenosis is present.   6. Pulmonic valve regurgitation is moderate.   7. The inferior vena cava is dilated in size with >50% respiratory  variability, suggesting right atrial pressure of 8 mmHg.   Conclusion(s)/Recommendation(s): No intracardiac source of embolism  detected on this transthoracic study. Consider a transesophageal  echocardiogram to exclude cardiac source of embolism if clinically  indicated.   FINDINGS   Left Ventricle: Left ventricular ejection fraction, by estimation, is 30  to 35%. The left ventricle has moderately decreased function. The left  ventricle demonstrates global hypokinesis. The left ventricular internal  cavity size was moderately dilated.  There is no left ventricular hypertrophy. Left ventricular diastolic  parameters are consistent with Grade I diastolic dysfunction (impaired  relaxation).   Right Ventricle: The right ventricular size is normal. No increase in  right ventricular wall thickness. Right ventricular systolic function is  normal. There is normal pulmonary artery systolic pressure. The tricuspid  regurgitant velocity is 2.00 m/s, and   with an assumed right atrial pressure of 8 mmHg, the estimated right  ventricular systolic pressure is 24.0 mmHg.   Left Atrium: Left atrial size was mildly dilated.   Right Atrium: Right atrial size was normal in size.   Pericardium: There is no evidence of pericardial effusion.   Mitral Valve: The mitral valve is normal in structure. Moderate mitral   valve regurgitation. No evidence of mitral valve stenosis.   Tricuspid Valve: The tricuspid valve is normal in structure. Tricuspid  valve regurgitation is not demonstrated. No evidence of tricuspid  stenosis.   Aortic Valve: The aortic valve is tricuspid. Aortic valve regurgitation is  not visualized. No aortic stenosis is present.   Pulmonic Valve: The pulmonic valve was normal in structure. Pulmonic valve  regurgitation is moderate. No evidence of pulmonic stenosis.   Aorta: The aortic root is normal in size and structure.   Venous: The inferior vena cava is dilated in size with greater than 50%  respiratory variability, suggesting right atrial pressure of 8 mmHg.   IAS/Shunts: No atrial level shunt detected by color flow Doppler.   Additional Comments: A device lead is visualized in the right ventricle.     LEFT VENTRICLE  PLAX 2D  LVIDd:         6.10 cm   Diastology  LVIDs:         5.10 cm   LV e' medial:    4.62 cm/s  LV PW:  0.90 cm   LV E/e' medial:  11.9  LV IVS:        0.90 cm   LV e' lateral:   4.78 cm/s  LVOT diam:     2.00 cm   LV E/e' lateral: 11.5  LV SV:         46  LV SV Index:   27  LVOT Area:     3.14 cm     RIGHT VENTRICLE             IVC  RV Basal diam:  3.80 cm     IVC diam: 2.90 cm  RV S prime:     16.60 cm/s  TAPSE (M-mode): 2.7 cm   LEFT ATRIUM             Index        RIGHT ATRIUM           Index  LA diam:        4.10 cm 2.36 cm/m   RA Area:     11.10 cm  LA Vol (A2C):   54.6 ml 31.47 ml/m  RA Volume:   24.10 ml  13.89 ml/m  LA Vol (A4C):   61.0 ml 35.16 ml/m  LA Biplane Vol: 58.4 ml 33.66 ml/m   AORTIC VALVE  LVOT Vmax:   60.25 cm/s  LVOT Vmean:  38.250 cm/s  LVOT VTI:    0.148 m    AORTA  Ao Root diam: 3.20 cm  Ao Asc diam:  3.40 cm   MITRAL VALVE               TRICUSPID VALVE  MV Area (PHT): 2.91 cm    TR Peak grad:   16.0 mmHg  MV Decel Time: 261 msec    TR Vmax:        200.00 cm/s  MV E velocity: 55.25 cm/s  MV A  velocity: 87.00 cm/s  SHUNTS  MV E/A ratio:  0.64        Systemic VTI:  0.15 m                             Systemic Diam: 2.00 cm   Oneil Parchment MD  Electronically signed by Oneil Parchment MD  Signature Date/Time: 06/12/2023/2:46:20 PM        Final      Impression:  This 73 year old woman has chronic systolic heart failure due to nonischemic cardiomyopathy with left bundle branch block morphology.  Her most recent echocardiogram in May 2025 showed a left ventricular ejection fraction of 30 to 35%.  She has NYHA class II symptoms of exertional fatigue and shortness of breath as well as lower extremity edema.  I agree that upgrade to a BiV device is the best option for trying to improve her symptoms and left ventricular ejection fraction.  I think she is a good candidate for epicardial placement of a left ventricular pacing lead via small left thoracotomy. I discussed the operative procedure with the patient and her husband including alternatives, benefits and risks; including but not limited to bleeding, blood transfusion, infection, as well as the possibility that this may not improve her symptoms or ejection fraction.  Cindia JONELLE Shams understands and agrees to proceed.  Plan:  She is going to discuss timing with her children and will call us  back tomorrow to schedule left thoracotomy for LV epicardial lead placement.  I spent 20 minutes performing this  consultation and > 50% of this time was spent face to face counseling and coordinating the care of this patient's nonischemic cardiomyopathy.   Dorise MARLA Fellers, MD [facility] [phone]

## 2023-09-10 ENCOUNTER — Encounter: Payer: Self-pay | Admitting: *Deleted

## 2023-09-10 ENCOUNTER — Other Ambulatory Visit: Payer: Self-pay | Admitting: *Deleted

## 2023-09-10 DIAGNOSIS — E1122 Type 2 diabetes mellitus with diabetic chronic kidney disease: Secondary | ICD-10-CM | POA: Diagnosis not present

## 2023-09-10 DIAGNOSIS — E782 Mixed hyperlipidemia: Secondary | ICD-10-CM | POA: Diagnosis not present

## 2023-09-10 DIAGNOSIS — E559 Vitamin D deficiency, unspecified: Secondary | ICD-10-CM | POA: Diagnosis not present

## 2023-09-10 DIAGNOSIS — I5022 Chronic systolic (congestive) heart failure: Secondary | ICD-10-CM | POA: Diagnosis not present

## 2023-09-23 ENCOUNTER — Encounter: Payer: Self-pay | Admitting: Cardiology

## 2023-09-23 NOTE — Pre-Procedure Instructions (Signed)
 Surgical Instructions   Your procedure is scheduled on September 28, 2023. Report to [facility] Main Entrance A at 5:30 A.M., then check in with the Admitting office. Any questions or running late day of surgery: call [phone]  Questions prior to your surgery date: call [phone], Monday-Friday, 8am-4pm. If you experience any cold or flu symptoms such as cough, fever, chills, shortness of breath, etc. between now and your scheduled surgery, please notify us  at the above number.     Remember:  Do not eat or drink after midnight the night before your surgery    Take these medicines the morning of surgery with A SIP OF WATER: anastrozole  (ARIMIDEX )  digoxin  (LANOXIN )  metoprolol  succinate (TOPROL  XL)    STOP taking the following medications three days prior to surgery. Your last doses will be August 14th: empagliflozin  (JARDIANCE )  rivaroxaban  (XARELTO )  sacubitril -valsartan  (ENTRESTO )    One week prior to surgery, STOP taking any Aspirin (unless otherwise instructed by your surgeon) Aleve, Naproxen, Ibuprofen, Motrin, Advil, Goody's, BC's, all herbal medications, fish oil, and non-prescription vitamins.                     Do NOT Smoke (Tobacco/Vaping) for 24 hours prior to your procedure.  If you use a CPAP at night, you may bring your mask/headgear for your overnight stay.   You will be asked to remove any contacts, glasses, piercing's, hearing aid's, dentures/partials prior to surgery. Please bring cases for these items if needed.    Patients discharged the day of surgery will not be allowed to drive home, and someone needs to stay with them for 24 hours.  SURGICAL WAITING ROOM VISITATION Patients may have no more than 2 support people in the waiting area - these visitors may rotate.   Pre-op  nurse will coordinate an appropriate time for 1 ADULT support person, who may not rotate, to accompany patient in pre-op .  Children under the age of 62 must have an adult with  them who is not the patient and must remain in the main waiting area with an adult.  If the patient needs to stay at the hospital during part of their recovery, the visitor guidelines for inpatient rooms apply.  Please refer to the [facility] website for the visitor guidelines for any additional information.   If you received a COVID test during your pre-op  visit  it is requested that you wear a mask when out in public, stay away from anyone that may not be feeling well and notify your surgeon if you develop symptoms. If you have been in contact with anyone that has tested positive in the last 10 days please notify you surgeon.      Pre-operative CHG Bathing Instructions   You can play a key role in reducing the risk of infection after surgery. Your skin needs to be as free of germs as possible. You can reduce the number of germs on your skin by washing with CHG (chlorhexidine  gluconate) soap before surgery. CHG is an antiseptic soap that kills germs and continues to kill germs even after washing.   DO NOT use if you have an allergy to chlorhexidine /CHG or antibacterial soaps. If your skin becomes reddened or irritated, stop using the CHG and notify one of our RNs at [phone].              TAKE A SHOWER THE NIGHT BEFORE SURGERY AND THE DAY OF SURGERY    Please keep in mind the  following:  DO NOT shave, including legs and underarms, 48 hours prior to surgery.   You may shave your face before/day of surgery.  Place clean sheets on your bed the night before surgery Use a clean washcloth (not used since being washed) for each shower. DO NOT sleep with pet's night before surgery.  CHG Shower Instructions:  Wash your face and private area with normal soap. If you choose to wash your hair, wash first with your normal shampoo.  After you use shampoo/soap, rinse your hair and body thoroughly to remove shampoo/soap residue.  Turn the water OFF and apply half the bottle of CHG soap to a CLEAN  washcloth.  Apply CHG soap ONLY FROM YOUR NECK DOWN TO YOUR TOES (washing for 3-5 minutes)  DO NOT use CHG soap on face, private areas, open wounds, or sores.  Pay special attention to the area where your surgery is being performed.  If you are having back surgery, having someone wash your back for you may be helpful. Wait 2 minutes after CHG soap is applied, then you may rinse off the CHG soap.  Pat dry with a clean towel  Put on clean pajamas    Additional instructions for the day of surgery: DO NOT APPLY any lotions, deodorants, cologne, or perfumes.   Do not wear jewelry or makeup Do not wear nail polish, gel polish, artificial nails, or any other type of covering on natural nails (fingers and toes) Do not bring valuables to the hospital. [facility] is not responsible for valuables/personal belongings. Put on clean/comfortable clothes.  Please brush your teeth.  Ask your nurse before applying any prescription medications to the skin.

## 2023-09-23 NOTE — Progress Notes (Signed)
 PERIOPERATIVE PRESCRIPTION FOR IMPLANTED CARDIAC DEVICE PROGRAMMING  Patient Information: Name:  [name] [name]  DOB:  01/14/1951  MRN:  [id number]  Planned Procedure:  Left Thoracotomy for Left Ventricular Epicardial Lead Placement  Surgeon:  Dr. Dorise Fellers  Date of Procedure:  09/28/2023  Cautery will be used.  Position during surgery:  Supine   Device Information:  Clinic EP Physician:  Dr. Ole Holts  Device Type:  Defibrillator Manufacturer and Phone #:  [employer]: [phone] Pacemaker Dependent?:  No. Date of Last Device Check:  08/20/2023 Normal Device Function?:  Yes.    Electrophysiologist's Recommendations:  Have magnet available. Provide continuous ECG monitoring when magnet is used or reprogramming is to be performed.  Procedure will likely interfere with device function.  Device should be programmed:  Tachy therapies disabled  Per Device Clinic Standing Orders, Delon DELENA Sharps, RN  4:28 PM 09/23/2023

## 2023-09-24 ENCOUNTER — Encounter (HOSPITAL_COMMUNITY): Payer: Self-pay

## 2023-09-24 ENCOUNTER — Other Ambulatory Visit: Payer: Self-pay

## 2023-09-24 ENCOUNTER — Encounter (HOSPITAL_COMMUNITY)
Admission: RE | Admit: 2023-09-24 | Discharge: 2023-09-24 | Disposition: A | Discharge status: Home / Self Care | Source: Ambulatory Visit | Attending: Surgery | Admitting: Surgery

## 2023-09-24 VITALS — BP 94/49 | HR 65 | Temp 97.7°F | Resp 16 | Ht 68.0 in | Wt 157.8 lb

## 2023-09-24 DIAGNOSIS — G453 Amaurosis fugax: Secondary | ICD-10-CM

## 2023-09-24 DIAGNOSIS — I4729 Other ventricular tachycardia: Secondary | ICD-10-CM

## 2023-09-24 DIAGNOSIS — Z860100 Personal history of colon polyps, unspecified: Secondary | ICD-10-CM

## 2023-09-24 DIAGNOSIS — I9589 Other hypotension: Secondary | ICD-10-CM

## 2023-09-24 DIAGNOSIS — I447 Left bundle-branch block, unspecified: Secondary | ICD-10-CM | POA: Insufficient documentation

## 2023-09-24 DIAGNOSIS — Z7901 Long term (current) use of anticoagulants: Secondary | ICD-10-CM

## 2023-09-24 DIAGNOSIS — M542 Cervicalgia: Secondary | ICD-10-CM

## 2023-09-24 DIAGNOSIS — I5022 Chronic systolic (congestive) heart failure: Secondary | ICD-10-CM | POA: Insufficient documentation

## 2023-09-24 DIAGNOSIS — Z9889 Other specified postprocedural states: Secondary | ICD-10-CM

## 2023-09-24 DIAGNOSIS — Z01818 Encounter for other preprocedural examination: Secondary | ICD-10-CM | POA: Diagnosis not present

## 2023-09-24 DIAGNOSIS — Z9012 Acquired absence of left breast and nipple: Secondary | ICD-10-CM

## 2023-09-24 DIAGNOSIS — U071 COVID-19: Secondary | ICD-10-CM

## 2023-09-24 DIAGNOSIS — Z95810 Presence of automatic (implantable) cardiac defibrillator: Secondary | ICD-10-CM

## 2023-09-24 DIAGNOSIS — Z8673 Personal history of transient ischemic attack (TIA), and cerebral infarction without residual deficits: Secondary | ICD-10-CM

## 2023-09-24 DIAGNOSIS — I420 Dilated cardiomyopathy: Secondary | ICD-10-CM

## 2023-09-24 HISTORY — DX: Chronic kidney disease, unspecified: N18.9

## 2023-09-24 HISTORY — DX: Pneumonia, unspecified organism: J18.9

## 2023-09-24 LAB — URINALYSIS, ROUTINE W REFLEX MICROSCOPIC
Bilirubin Urine: NEGATIVE
Glucose, UA: >=500 mg/dL — AB
Ketones, ur: NEGATIVE mg/dL
Nitrite: POSITIVE — AB
Protein, ur: NEGATIVE mg/dL
Specific Gravity, Urine: 1.018 (ref 1.005–1.030)
pH: 5 (ref 5.0–8.0)

## 2023-09-24 LAB — COMPREHENSIVE METABOLIC PANEL WITH GFR
ALT: 21 U/L (ref 0–44)
AST: 25 U/L (ref 15–41)
Albumin: 4.3 g/dL (ref 3.5–5.0)
Alkaline Phosphatase: 38 U/L (ref 38–126)
Anion gap: 8 (ref 5–15)
BUN: 26 mg/dL — ABNORMAL HIGH (ref 8–23)
CO2: 26 mmol/L (ref 22–32)
Calcium: 9.5 mg/dL (ref 8.9–10.3)
Chloride: 103 mmol/L (ref 98–111)
Creatinine, Ser: 1.12 mg/dL — ABNORMAL HIGH (ref 0.44–1.00)
GFR, Estimated: 52 mL/min — ABNORMAL LOW (ref >60)
Glucose, Bld: 112 mg/dL — ABNORMAL HIGH (ref 70–99)
Potassium: 3.9 mmol/L (ref 3.5–5.1)
Sodium: 137 mmol/L (ref 135–145)
Total Bilirubin: 0.9 mg/dL (ref 0.0–1.2)
Total Protein: 7.1 g/dL (ref 6.5–8.1)

## 2023-09-24 LAB — SURGICAL PCR SCREEN
MRSA, PCR: NEGATIVE
Staphylococcus aureus: NEGATIVE

## 2023-09-24 LAB — CBC
HCT: 38.4 % (ref 36.0–46.0)
Hemoglobin: 12.5 g/dL (ref 12.0–15.0)
MCH: 29 pg (ref 26.0–34.0)
MCHC: 32.6 g/dL (ref 30.0–36.0)
MCV: 89.1 fL (ref 80.0–100.0)
Platelets: 112 K/uL — ABNORMAL LOW (ref 150–400)
RBC: 4.31 MIL/uL (ref 3.87–5.11)
RDW: 14.7 % (ref 11.5–15.5)
WBC: 7.8 K/uL (ref 4.0–10.5)
nRBC: 0 % (ref 0.0–0.2)

## 2023-09-24 LAB — TYPE AND SCREEN
ABO/RH(D): O POS
Antibody Screen: NEGATIVE

## 2023-09-24 LAB — APTT: aPTT: 37 s — ABNORMAL HIGH (ref 24–36)

## 2023-09-24 LAB — PROTIME-INR
INR: 1.5 — ABNORMAL HIGH (ref 0.8–1.2)
Prothrombin Time: 18.7 s — ABNORMAL HIGH (ref 11.4–15.2)

## 2023-09-24 NOTE — Progress Notes (Signed)
 PCP - Dr. Jerel Sieving Cardiologist - Dr. Lynwood Schilling  Electrophysiologist - Dr. Ole Holts  PPM/ICD - [employer] ICD Device Orders - Order requested and received 09/24/2023 Rep Notified - [employer] rep Redell Regal notified of surgery date, time and device order recommendations  Chest x-ray - To be completed DOS EKG - 09/24/2023 Stress Test - 06/11/2016 ECHO - 06/12/2023 Cardiac Cath - Denies  Sleep Study - Denies CPAP - n/a  Pt is Pre-DM  Last dose of GLP1 agonist- n/a GLP1 instructions: n/a  Blood Thinner Instructions: Pt instructed to stop taking Xarelto  3 days prior to surgery. Last dose August 14th. Aspirin Instructions: n/a  NPO after midnight  COVID TEST- n/a   Anesthesia review: Yes. Hx of CHF, ICD, CVA (no deficits) and recent bilateral ocular strokes (no deficits). Pt also instructed to stop taking Jardiance  and Entresto  three days prior to surgery, with last doses August 14th.   Patient denies shortness of breath, fever, cough and chest pain at PAT appointment. Pt denies any respiratory illness/infection in the last two months   All instructions explained to the patient, with a verbal understanding of the material. Patient agrees to go over the instructions while at home for a better understanding. Patient also instructed to self quarantine after being tested for COVID-19. The opportunity to ask questions was provided.

## 2023-09-24 NOTE — Progress Notes (Signed)
 Secure chat message for Bernardino Sprang, RN re: lab results for urine. Amber, cloudy, + nitrites, trace leuks, MANY bacteria

## 2023-09-25 ENCOUNTER — Other Ambulatory Visit: Payer: Self-pay

## 2023-09-25 MED ORDER — SULFAMETHOXAZOLE-TRIMETHOPRIM 800-160 MG PO TABS
1.0000 | ORAL_TABLET | Freq: Two times a day (BID) | ORAL | 0 refills | Status: DC
Start: 2023-09-25 — End: 2023-09-30

## 2023-09-25 NOTE — Progress Notes (Signed)
 Patient prescribed Bactrim  for UTI that was discovered during pre op testing per Dr. Lucas. Prescription sent into patient's preferred pharmacy [facility] in [place]. Patient aware and acknowledged receipt.

## 2023-09-25 NOTE — Progress Notes (Signed)
 RN spoke with Redell Regal, [employer] Rep, regarding patients upcoming surgery. Verified surgery, patient arrival time, surgery start time and surgeon with rep who states he will be here prior to start time on 09/28/2023.

## 2023-09-27 NOTE — H&P (Signed)
 [address], [place] [address]             [phone]     Cardiothoracic Surgery Admission History and Physical   PCP is [name] Jerel MATSU, MD Referring Provider is [name] Ole DASEN, MD       Chief Complaint  Patient presents with   Congestive Heart Failure           HPI:   The patient is a 73 year old woman with a history of familial nonischemic cardiomyopathy, bilateral mastectomy, amaurosis fugax on Xarelto  for previous LV thrombus who has an ICD in place.  She is on GDMT with a left ventricular ejection fraction of 30 to 35% with global hypokinesis.  There is moderate mitral regurgitation.  Right ventricular systolic function is normal.  She has developed exertional fatigue and shortness of breath as well as lower extremity edema and was evaluated by Dr. Cindie for BiV upgrade.  An upper extremity venogram showed an occluded left subclavian and axillary vein.  It was felt that the alternatives would be epicardial lead implant on the LV versus a right subclavian vein coronary sinus lead with tunneling to the left chest.  Dr. Cindie was concerned about the right sided veins and felt that she should be evaluated for epicardial lead implant.  She is here today with her husband.   She had 1 brother with cardiomyopathy requiring cardiac transplant and 1 brother requiring LVAD for cardiomyopathy.       Past Medical History:  Diagnosis Date   AICD (automatic cardioverter/defibrillator) present     Arthritis      thumbs   Breast cancer (HCC)      right   Cerebrovascular disease      s/p right MCA cardioemoblic infarct 8/71/89   CHF (congestive heart failure) (HCC)     Nonischemic cardiomyopathy (HCC)      a. +LMNA mutation b. s/p STJ dual chamber ICD   Pre-diabetes     Presence of permanent cardiac pacemaker     Stroke (HCC) 02/2008    no deficits               Past Surgical History:  Procedure Laterality Date   ABDOMINAL HYSTERECTOMY        BIOPSY   09/09/2022    Procedure: BIOPSY;  Surgeon: [name] Angelia Toribio, MD;  [facility]: [facility];  Service: Gastroenterology;;   JUVENTINO LLANO N/A 08/04/2023    Procedure: BIV LLANO;  Surgeon: [name] Ole DASEN, MD;  Location: [facility];  Service: Cardiovascular;  Laterality: N/A;   BREAST IMPLANT REMOVAL Right 05/31/2015    Procedure: REMOVAL RIGHT  BREAST IMPLANTS;  Surgeon: Alm Sick, MD;  Location: [facility] OR;  Service: Plastics;  Laterality: Right;   BREAST RECONSTRUCTION Right 1995   BREAST RECONSTRUCTION Right 05/31/2015    Procedure: RIGHT BREAST RECONSTRUCTION WITH SALINE IMPLANTS ;  Surgeon: Alm Sick, MD;  Location: [facility] OR;  Service: Plastics;  Laterality: Right;   CARDIAC DEFIBRILLATOR PLACEMENT   2010    STJ dual chamber ICD implanted for NICM by Dr Fernande   CARDIAC DEFIBRILLATOR PLACEMENT   05/26/2008   CATARACT EXTRACTION W/PHACO Right 03/19/2018    Procedure: CATARACT EXTRACTION PHACO AND INTRAOCULAR LENS PLACEMENT (IOC);  Surgeon: Harrie Agent, MD;  Location: [facility];  Service: Ophthalmology;  Laterality: Right;  CDE: 5.42   CATARACT EXTRACTION W/PHACO Left 10/01/2018    Procedure: CATARACT EXTRACTION PHACO AND INTRAOCULAR LENS  PLACEMENT (IOC);  Surgeon: Harrie Agent, MD;  Location: [facility];  Service: Ophthalmology;  Laterality: Left;  left, CDE: 6.06   CHOLECYSTECTOMY       COLONOSCOPY       COLONOSCOPY WITH PROPOFOL  N/A 09/09/2022    Procedure: COLONOSCOPY WITH PROPOFOL ;  Surgeon: [name] Angelia Sieving, MD;  [facility]: [facility];  Service: Gastroenterology;  Laterality: N/A;  8:30am;asa 3, moved to 2:15 per Tanya   EP IMPLANTABLE DEVICE N/A 08/06/2015    Procedure: ICD Generator Changeout;  Surgeon: Elspeth JAYSON Sage, MD;  Location: [facility] INVASIVE CV LAB;  Service: Cardiovascular;  Laterality: N/A;   ESOPHAGOGASTRODUODENOSCOPY       LEAD INSERTION N/A 08/04/2023    Procedure: LEAD INSERTION;  Surgeon: [name] Ole DASEN, MD;  Location: [facility];   Service: Cardiovascular;  Laterality: N/A;   MASTECTOMY Right     MASTECTOMY W/ SENTINEL NODE BIOPSY Left 04/05/2021    Procedure: LEFT MASTECTOMY;  Surgeon: [name] Berg, MD;  Location: [facility] OR;  Service: General;  Laterality: Left;   MASTOPEXY Left 05/31/2015    Procedure: LEFT BREAST MASTOPEXY;  Surgeon: Alm Sick, MD;  Location: [facility] OR;  Service: Plastics;  Laterality: Left;   PACEMAKER INSERTION   05/2008   POLYPECTOMY   09/09/2022    Procedure: POLYPECTOMY;  Surgeon: [name] Angelia Sieving, MD;  [facility]: [facility];  Service: Gastroenterology;;   SENTINEL NODE BIOPSY Left 04/05/2021    Procedure: SENTINEL LYMPH NODE BIOPSY;  Surgeon: [name] Berg, MD;  Location: [facility] OR;  Service: General;  Laterality: Left;   SUBMUCOSAL INJECTION   09/09/2022    Procedure: SUBMUCOSAL INJECTION;  Surgeon: [name] Angelia Sieving, MD;  [facility]: [facility];  Service: Gastroenterology;;   ROBLEY MEYER INJECTION   09/09/2022    Procedure: SUBMUCOSAL LIFTING INJECTION;  Surgeon: [name] Angelia Sieving, MD;  Location: [facility];  Service: Gastroenterology;;   TONSILLECTOMY       VARICOSE VEIN SURGERY Left      leg               Family History  Problem Relation Age of Onset   Cardiomyopathy Other          family hx of   Other Brother          s/p heart transplant   Stroke Other          family hx of          Social History Social History  Social History        Tobacco Use   Smoking status: Never   Smokeless tobacco: Never  Vaping Use   Vaping status: Never Used  Substance Use Topics   Alcohol use: No   Drug use: No              Current Outpatient Medications  Medication Sig Dispense Refill   ACCU-CHEK GUIDE TEST test strip daily.       Accu-Chek Softclix Lancets lancets daily.       anastrozole  (ARIMIDEX ) 1 MG tablet Take 1 mg by mouth in the morning.       atorvastatin  (LIPITOR) 20 MG tablet Take 1 tablet (20 mg total) by mouth daily. (Patient  taking differently: Take 20 mg by mouth every evening.) 30 tablet 2   cholecalciferol  (VITAMIN D3) 25 MCG (1000 UNIT) tablet Take 1,000 Units by mouth daily.       Collagen-Vitamin C-Biotin (COLLAGEN PO) Take 1 Package by mouth daily.  digoxin  (LANOXIN ) 0.125 MG tablet Take 0.5 tablets (0.0625 mg total) by mouth daily. 45 tablet 3   empagliflozin  (JARDIANCE ) 10 MG TABS tablet Take 1 tablet (10 mg total) by mouth daily before breakfast. 90 tablet 3   furosemide  (LASIX ) 80 MG tablet Take 40-60 mg by mouth every other day.       metoprolol  succinate (TOPROL  XL) 25 MG 24 hr tablet Take 1 tablet (25 mg total) by mouth in the morning AND 2 tablets (50 mg total) at bedtime. 270 tablet 3   Multiple Vitamin (MULTIVITAMIN WITH MINERALS) TABS tablet Take 1 tablet by mouth in the morning.       rivaroxaban  (XARELTO ) 20 MG TABS tablet Take 1 tablet (20 mg total) by mouth daily with supper. 90 tablet 3   sacubitril -valsartan  (ENTRESTO ) 97-103 MG Take 1 tablet by mouth 2 (two) times daily.       spironolactone  (ALDACTONE ) 25 MG tablet TAKE 1/2 TABLET BY MOUTH ONCE DAILY 45 tablet 3      No current facility-administered medications for this visit.        Allergies  No Known Allergies     Review of Systems  Constitutional:  Positive for activity change and fatigue.  HENT: Negative.    Eyes: Negative.   Respiratory:  Positive for shortness of breath.   Cardiovascular:  Positive for leg swelling. Negative for chest pain.  Gastrointestinal: Negative.   Endocrine: Negative.   Genitourinary: Negative.   Musculoskeletal: Negative.   Skin: Negative.   Allergic/Immunologic: Negative.   Neurological:  Positive for dizziness.  Hematological: Negative.   Psychiatric/Behavioral: Negative.       BP (!) 95/53   Pulse 70   Resp 20   Ht 5' 8 (1.727 m)   Wt 157 lb (71.2 kg)   SpO2 96% Comment: RA  BMI 23.87 kg/m  Physical Exam Constitutional:      Appearance: Normal appearance. She is normal  weight.  HENT:     Head: Normocephalic and atraumatic.  Eyes:     Extraocular Movements: Extraocular movements intact.     Conjunctiva/sclera: Conjunctivae normal.     Pupils: Pupils are equal, round, and reactive to light.  Neck:     Vascular: No carotid bruit.  Cardiovascular:     Rate and Rhythm: Normal rate and regular rhythm.     Pulses: Normal pulses.     Heart sounds: Normal heart sounds. No murmur heard. Pulmonary:     Effort: Pulmonary effort is normal.     Breath sounds: Normal breath sounds.  Musculoskeletal:        General: Swelling present.  Skin:    General: Skin is warm and dry.  Neurological:     General: No focal deficit present.     Mental Status: She is alert and oriented to person, place, and time.  Psychiatric:        Mood and Affect: Mood normal.        Behavior: Behavior normal.       Diagnostic Tests:   ECHOCARDIOGRAM REPORT       Patient Name:   [name] [name] Date of Exam: 06/12/2023  Medical Rec #:  [id number]         Height:       68.0 in  Accession #:    [id number]        Weight:       136.0 lb  Date of Birth:  Feb 21, 1950  BSA:          1.735 m  Patient Age:    73 years          BP:           94/54 mmHg  Patient Gender: F                 HR:           54 bpm.  Exam Location:  [address]   Procedure: 2D Echo, Cardiac Doppler and Color Doppler (Both Spectral and  Color            Flow Doppler were utilized during procedure).   Indications:    G45.3 Amaurosis Fugax    History:        Patient has prior history of Echocardiogram examinations,  most                 recent 03/17/2021. CHF, Defibrillator; Stroke. Nonischemic                  cardiomyopathy.    Sonographer:    Carl Rodgers-Jones RDCS  Referring Phys: [id number] JAMES HOCHREIN   IMPRESSIONS     1. Left ventricular ejection fraction, by estimation, is 30 to 35%. The  left ventricle has moderately decreased function. The left ventricle  demonstrates global  hypokinesis. The left ventricular internal cavity size  was moderately dilated. Left  ventricular diastolic parameters are consistent with Grade I diastolic  dysfunction (impaired relaxation).   2. Right ventricular systolic function is normal. The right ventricular  size is normal. There is normal pulmonary artery systolic pressure. The  estimated right ventricular systolic pressure is 24.0 mmHg.   3. Left atrial size was mildly dilated.   4. The mitral valve is normal in structure. Moderate mitral valve  regurgitation. No evidence of mitral stenosis.   5. The aortic valve is tricuspid. Aortic valve regurgitation is not  visualized. No aortic stenosis is present.   6. Pulmonic valve regurgitation is moderate.   7. The inferior vena cava is dilated in size with >50% respiratory  variability, suggesting right atrial pressure of 8 mmHg.   Conclusion(s)/Recommendation(s): No intracardiac source of embolism  detected on this transthoracic study. Consider a transesophageal  echocardiogram to exclude cardiac source of embolism if clinically  indicated.   FINDINGS   Left Ventricle: Left ventricular ejection fraction, by estimation, is 30  to 35%. The left ventricle has moderately decreased function. The left  ventricle demonstrates global hypokinesis. The left ventricular internal  cavity size was moderately dilated.  There is no left ventricular hypertrophy. Left ventricular diastolic  parameters are consistent with Grade I diastolic dysfunction (impaired  relaxation).   Right Ventricle: The right ventricular size is normal. No increase in  right ventricular wall thickness. Right ventricular systolic function is  normal. There is normal pulmonary artery systolic pressure. The tricuspid  regurgitant velocity is 2.00 m/s, and   with an assumed right atrial pressure of 8 mmHg, the estimated right  ventricular systolic pressure is 24.0 mmHg.   Left Atrium: Left atrial size was mildly  dilated.   Right Atrium: Right atrial size was normal in size.   Pericardium: There is no evidence of pericardial effusion.   Mitral Valve: The mitral valve is normal in structure. Moderate mitral  valve regurgitation. No evidence of mitral valve stenosis.   Tricuspid Valve: The tricuspid valve is normal in structure. Tricuspid  valve regurgitation is not demonstrated. No evidence of  tricuspid  stenosis.   Aortic Valve: The aortic valve is tricuspid. Aortic valve regurgitation is  not visualized. No aortic stenosis is present.   Pulmonic Valve: The pulmonic valve was normal in structure. Pulmonic valve  regurgitation is moderate. No evidence of pulmonic stenosis.   Aorta: The aortic root is normal in size and structure.   Venous: The inferior vena cava is dilated in size with greater than 50%  respiratory variability, suggesting right atrial pressure of 8 mmHg.   IAS/Shunts: No atrial level shunt detected by color flow Doppler.   Additional Comments: A device lead is visualized in the right ventricle.     LEFT VENTRICLE  PLAX 2D  LVIDd:         6.10 cm   Diastology  LVIDs:         5.10 cm   LV e' medial:    4.62 cm/s  LV PW:         0.90 cm   LV E/e' medial:  11.9  LV IVS:        0.90 cm   LV e' lateral:   4.78 cm/s  LVOT diam:     2.00 cm   LV E/e' lateral: 11.5  LV SV:         46  LV SV Index:   27  LVOT Area:     3.14 cm     RIGHT VENTRICLE             IVC  RV Basal diam:  3.80 cm     IVC diam: 2.90 cm  RV S prime:     16.60 cm/s  TAPSE (M-mode): 2.7 cm   LEFT ATRIUM             Index        RIGHT ATRIUM           Index  LA diam:        4.10 cm 2.36 cm/m   RA Area:     11.10 cm  LA Vol (A2C):   54.6 ml 31.47 ml/m  RA Volume:   24.10 ml  13.89 ml/m  LA Vol (A4C):   61.0 ml 35.16 ml/m  LA Biplane Vol: 58.4 ml 33.66 ml/m   AORTIC VALVE  LVOT Vmax:   60.25 cm/s  LVOT Vmean:  38.250 cm/s  LVOT VTI:    0.148 m    AORTA  Ao Root diam: 3.20 cm  Ao Asc diam:   3.40 cm   MITRAL VALVE               TRICUSPID VALVE  MV Area (PHT): 2.91 cm    TR Peak grad:   16.0 mmHg  MV Decel Time: 261 msec    TR Vmax:        200.00 cm/s  MV E velocity: 55.25 cm/s  MV A velocity: 87.00 cm/s  SHUNTS  MV E/A ratio:  0.64        Systemic VTI:  0.15 m                             Systemic Diam: 2.00 cm   Oneil Parchment MD  Electronically signed by Oneil Parchment MD  Signature Date/Time: 06/12/2023/2:46:20 PM        Final        Impression:   This 73 year old woman has chronic systolic heart failure due to nonischemic cardiomyopathy with left bundle branch  block morphology.  Her most recent echocardiogram in May 2025 showed a left ventricular ejection fraction of 30 to 35%.  She has NYHA class II symptoms of exertional fatigue and shortness of breath as well as lower extremity edema.  I agree that upgrade to a BiV device is the best option for trying to improve her symptoms and left ventricular ejection fraction.  I think she is a good candidate for epicardial placement of a left ventricular pacing lead via small left thoracotomy. I discussed the operative procedure with the patient and her husband including alternatives, benefits and risks; including but not limited to bleeding, blood transfusion, infection, as well as the possibility that this may not improve her symptoms or ejection fraction.  Cindia JONELLE Shams understands and agrees to proceed.   Plan:   Left thoracotomy for LV epicardial lead placement.       Dorise MARLA Fellers, MD [facility] [phone]

## 2023-09-28 ENCOUNTER — Inpatient Hospital Stay (HOSPITAL_COMMUNITY): Payer: Self-pay | Admitting: Anesthesiology

## 2023-09-28 ENCOUNTER — Inpatient Hospital Stay (HOSPITAL_COMMUNITY)

## 2023-09-28 ENCOUNTER — Encounter (HOSPITAL_COMMUNITY): Payer: Self-pay | Admitting: Surgery

## 2023-09-28 ENCOUNTER — Other Ambulatory Visit: Payer: Self-pay

## 2023-09-28 ENCOUNTER — Inpatient Hospital Stay (HOSPITAL_COMMUNITY)
Admission: RE | Admit: 2023-09-28 | Discharge: 2023-10-01 | DRG: 277 | Disposition: A | Discharge status: Home / Self Care | Attending: Surgery | Admitting: Surgery

## 2023-09-28 ENCOUNTER — Encounter (HOSPITAL_COMMUNITY)
Admission: RE | Disposition: A | Discharge status: Home / Self Care | Payer: Self-pay | Source: Home / Self Care | Attending: Surgery

## 2023-09-28 DIAGNOSIS — I82B22 Chronic embolism and thrombosis of left subclavian vein: Secondary | ICD-10-CM | POA: Diagnosis not present

## 2023-09-28 DIAGNOSIS — T464X5A Adverse effect of angiotensin-converting-enzyme inhibitors, initial encounter: Secondary | ICD-10-CM | POA: Diagnosis not present

## 2023-09-28 DIAGNOSIS — I340 Nonrheumatic mitral (valve) insufficiency: Secondary | ICD-10-CM | POA: Diagnosis present

## 2023-09-28 DIAGNOSIS — I447 Left bundle-branch block, unspecified: Secondary | ICD-10-CM | POA: Diagnosis present

## 2023-09-28 DIAGNOSIS — I5020 Unspecified systolic (congestive) heart failure: Secondary | ICD-10-CM | POA: Diagnosis not present

## 2023-09-28 DIAGNOSIS — Z86718 Personal history of other venous thrombosis and embolism: Secondary | ICD-10-CM | POA: Diagnosis not present

## 2023-09-28 DIAGNOSIS — Z823 Family history of stroke: Secondary | ICD-10-CM | POA: Diagnosis not present

## 2023-09-28 DIAGNOSIS — Z853 Personal history of malignant neoplasm of breast: Secondary | ICD-10-CM | POA: Diagnosis not present

## 2023-09-28 DIAGNOSIS — Z79811 Long term (current) use of aromatase inhibitors: Secondary | ICD-10-CM | POA: Diagnosis not present

## 2023-09-28 DIAGNOSIS — I82A22 Chronic embolism and thrombosis of left axillary vein: Secondary | ICD-10-CM | POA: Diagnosis present

## 2023-09-28 DIAGNOSIS — J9811 Atelectasis: Secondary | ICD-10-CM | POA: Diagnosis not present

## 2023-09-28 DIAGNOSIS — E876 Hypokalemia: Secondary | ICD-10-CM | POA: Diagnosis not present

## 2023-09-28 DIAGNOSIS — Z7901 Long term (current) use of anticoagulants: Secondary | ICD-10-CM | POA: Diagnosis not present

## 2023-09-28 DIAGNOSIS — Z90710 Acquired absence of both cervix and uterus: Secondary | ICD-10-CM | POA: Diagnosis not present

## 2023-09-28 DIAGNOSIS — I493 Ventricular premature depolarization: Secondary | ICD-10-CM | POA: Diagnosis not present

## 2023-09-28 DIAGNOSIS — N179 Acute kidney failure, unspecified: Secondary | ICD-10-CM | POA: Diagnosis not present

## 2023-09-28 DIAGNOSIS — I5022 Chronic systolic (congestive) heart failure: Secondary | ICD-10-CM | POA: Diagnosis not present

## 2023-09-28 DIAGNOSIS — Z7984 Long term (current) use of oral hypoglycemic drugs: Secondary | ICD-10-CM | POA: Diagnosis not present

## 2023-09-28 DIAGNOSIS — Z9013 Acquired absence of bilateral breasts and nipples: Secondary | ICD-10-CM | POA: Diagnosis not present

## 2023-09-28 DIAGNOSIS — I428 Other cardiomyopathies: Secondary | ICD-10-CM | POA: Diagnosis present

## 2023-09-28 DIAGNOSIS — Z79899 Other long term (current) drug therapy: Secondary | ICD-10-CM | POA: Diagnosis not present

## 2023-09-28 DIAGNOSIS — C50911 Malignant neoplasm of unspecified site of right female breast: Secondary | ICD-10-CM | POA: Diagnosis present

## 2023-09-28 DIAGNOSIS — Z9049 Acquired absence of other specified parts of digestive tract: Secondary | ICD-10-CM

## 2023-09-28 DIAGNOSIS — Z8673 Personal history of transient ischemic attack (TIA), and cerebral infarction without residual deficits: Secondary | ICD-10-CM

## 2023-09-28 DIAGNOSIS — N1419 Nephropathy induced by other drugs, medicaments and biological substances: Secondary | ICD-10-CM | POA: Diagnosis not present

## 2023-09-28 DIAGNOSIS — R140 Abdominal distension (gaseous): Secondary | ICD-10-CM | POA: Diagnosis not present

## 2023-09-28 DIAGNOSIS — N990 Postprocedural (acute) (chronic) kidney failure: Secondary | ICD-10-CM | POA: Diagnosis not present

## 2023-09-28 DIAGNOSIS — Z4682 Encounter for fitting and adjustment of non-vascular catheter: Secondary | ICD-10-CM | POA: Diagnosis not present

## 2023-09-28 DIAGNOSIS — Z9889 Other specified postprocedural states: Principal | ICD-10-CM

## 2023-09-28 DIAGNOSIS — N390 Urinary tract infection, site not specified: Secondary | ICD-10-CM | POA: Diagnosis present

## 2023-09-28 DIAGNOSIS — Z95810 Presence of automatic (implantable) cardiac defibrillator: Secondary | ICD-10-CM | POA: Diagnosis not present

## 2023-09-28 DIAGNOSIS — R918 Other nonspecific abnormal finding of lung field: Secondary | ICD-10-CM | POA: Diagnosis not present

## 2023-09-28 DIAGNOSIS — T502X5A Adverse effect of carbonic-anhydrase inhibitors, benzothiadiazides and other diuretics, initial encounter: Secondary | ICD-10-CM | POA: Diagnosis not present

## 2023-09-28 DIAGNOSIS — I517 Cardiomegaly: Secondary | ICD-10-CM | POA: Diagnosis not present

## 2023-09-28 DIAGNOSIS — I700 Atherosclerosis of aorta: Secondary | ICD-10-CM | POA: Diagnosis not present

## 2023-09-28 HISTORY — PX: EPICARDIAL PACING LEAD PLACEMENT: SHX6274

## 2023-09-28 HISTORY — PX: THORACOTOMY: SHX5074

## 2023-09-28 HISTORY — DX: Other specified postprocedural states: Z98.890

## 2023-09-28 LAB — GLUCOSE, CAPILLARY
Glucose-Capillary: 152 mg/dL — ABNORMAL HIGH (ref 70–99)
Glucose-Capillary: 169 mg/dL — ABNORMAL HIGH (ref 70–99)
Glucose-Capillary: 156 mg/dL — ABNORMAL HIGH (ref 70–99)
Glucose-Capillary: 192 mg/dL — ABNORMAL HIGH (ref 70–99)

## 2023-09-28 SURGERY — THORACOTOMY, MAJOR
Anesthesia: General | Site: Chest | Laterality: Left

## 2023-09-28 MED ORDER — EPHEDRINE SULFATE-NACL 50-0.9 MG/10ML-% IV SOSY
PREFILLED_SYRINGE | INTRAVENOUS | Status: DC | PRN
  Administered 2023-09-28: 5 mg via INTRAVENOUS

## 2023-09-28 MED ORDER — HEMOSTATIC AGENTS (NO CHARGE) OPTIME
TOPICAL | Status: DC | PRN
Start: 2023-09-28 — End: 2023-09-28
  Administered 2023-09-28: 1 via TOPICAL

## 2023-09-28 MED ORDER — ONDANSETRON HCL 4 MG/2ML IJ SOLN
4.0000 mg | Freq: Four times a day (QID) | INTRAMUSCULAR | Status: DC | PRN
  Administered 2023-09-28 (×2): 4 mg via INTRAVENOUS
  Filled 2023-09-28 (×2): qty 2

## 2023-09-28 MED ORDER — MIDAZOLAM HCL 2 MG/2ML IJ SOLN
INTRAMUSCULAR | Status: AC
  Filled 2023-09-28: qty 2

## 2023-09-28 MED ORDER — ACETAMINOPHEN 10 MG/ML IV SOLN
1000.0000 mg | Freq: Once | INTRAVENOUS | Status: DC | PRN
  Administered 2023-09-28: 1000 mg via INTRAVENOUS

## 2023-09-28 MED ORDER — VANCOMYCIN HCL IN DEXTROSE 1-5 GM/200ML-% IV SOLN
1000.0000 mg | Freq: Two times a day (BID) | INTRAVENOUS | Status: AC
  Administered 2023-09-28: 1000 mg via INTRAVENOUS
  Filled 2023-09-28: qty 200

## 2023-09-28 MED ORDER — ANASTROZOLE 1 MG PO TABS
1.0000 mg | ORAL_TABLET | Freq: Every morning | ORAL | Status: DC
  Administered 2023-09-29 – 2023-10-01 (×3): 1 mg via ORAL
  Filled 2023-09-28 (×3): qty 1

## 2023-09-28 MED ORDER — FENTANYL CITRATE (PF) 250 MCG/5ML IJ SOLN
INTRAMUSCULAR | Status: DC | PRN
  Administered 2023-09-28: 25 ug via INTRAVENOUS
  Administered 2023-09-28: 50 ug via INTRAVENOUS
  Administered 2023-09-28: 75 ug via INTRAVENOUS
  Administered 2023-09-28 (×2): 50 ug via INTRAVENOUS

## 2023-09-28 MED ORDER — DEXAMETHASONE SODIUM PHOSPHATE 10 MG/ML IJ SOLN
INTRAMUSCULAR | Status: DC | PRN
  Administered 2023-09-28: 8 mg via INTRAVENOUS

## 2023-09-28 MED ORDER — OXYCODONE HCL 5 MG PO TABS
5.0000 mg | ORAL_TABLET | ORAL | Status: DC | PRN
  Filled 2023-09-28: qty 2

## 2023-09-28 MED ORDER — OXYCODONE HCL 5 MG/5ML PO SOLN: 5.0000 mg | Freq: Once | ORAL | Status: DC | PRN

## 2023-09-28 MED ORDER — ENOXAPARIN SODIUM 40 MG/0.4ML IJ SOSY
40.0000 mg | PREFILLED_SYRINGE | Freq: Every day | INTRAMUSCULAR | Status: DC
  Administered 2023-09-28 – 2023-09-30 (×3): 40 mg via SUBCUTANEOUS
  Filled 2023-09-28 (×3): qty 0.4

## 2023-09-28 MED ORDER — METOPROLOL SUCCINATE ER 25 MG PO TB24: 25.0000 mg | ORAL_TABLET | Freq: Every day | ORAL | Status: DC

## 2023-09-28 MED ORDER — ORAL CARE MOUTH RINSE: 15.0000 mL | Freq: Once | OROMUCOSAL | Status: AC

## 2023-09-28 MED ORDER — VITAMIN D 25 MCG (1000 UNIT) PO TABS
1000.0000 [IU] | ORAL_TABLET | Freq: Every day | ORAL | Status: DC
  Administered 2023-09-29 – 2023-10-01 (×3): 1000 [IU] via ORAL
  Filled 2023-09-28 (×4): qty 1

## 2023-09-28 MED ORDER — ONDANSETRON HCL 4 MG/2ML IJ SOLN
INTRAMUSCULAR | Status: AC
  Filled 2023-09-28: qty 2

## 2023-09-28 MED ORDER — FUROSEMIDE 40 MG PO TABS
60.0000 mg | ORAL_TABLET | ORAL | Status: DC
  Administered 2023-09-29: 60 mg via ORAL
  Filled 2023-09-28: qty 1

## 2023-09-28 MED ORDER — EPHEDRINE 5 MG/ML INJ
INTRAVENOUS | Status: AC
  Filled 2023-09-28: qty 5

## 2023-09-28 MED ORDER — OXYCODONE HCL 5 MG PO TABS: 5.0000 mg | ORAL_TABLET | Freq: Once | ORAL | Status: DC | PRN

## 2023-09-28 MED ORDER — ACETAMINOPHEN 10 MG/ML IV SOLN
INTRAVENOUS | Status: AC
  Filled 2023-09-28: qty 100

## 2023-09-28 MED ORDER — METOCLOPRAMIDE HCL 5 MG/ML IJ SOLN
10.0000 mg | Freq: Four times a day (QID) | INTRAMUSCULAR | Status: AC
  Administered 2023-09-28 – 2023-09-29 (×4): 10 mg via INTRAVENOUS
  Filled 2023-09-28 (×4): qty 2

## 2023-09-28 MED ORDER — ONDANSETRON HCL 4 MG/2ML IJ SOLN
INTRAMUSCULAR | Status: DC | PRN
  Administered 2023-09-28: 4 mg via INTRAVENOUS

## 2023-09-28 MED ORDER — SACUBITRIL-VALSARTAN 97-103 MG PO TABS
1.0000 | ORAL_TABLET | Freq: Two times a day (BID) | ORAL | Status: DC
  Filled 2023-09-28: qty 1

## 2023-09-28 MED ORDER — PANTOPRAZOLE SODIUM 40 MG PO TBEC
40.0000 mg | DELAYED_RELEASE_TABLET | Freq: Every day | ORAL | Status: DC
  Administered 2023-09-29 – 2023-10-01 (×3): 40 mg via ORAL
  Filled 2023-09-28 (×3): qty 1

## 2023-09-28 MED ORDER — LACTATED RINGERS IV SOLN: INTRAVENOUS | Status: DC | PRN

## 2023-09-28 MED ORDER — CEFAZOLIN SODIUM-DEXTROSE 2-4 GM/100ML-% IV SOLN
2.0000 g | Freq: Three times a day (TID) | INTRAVENOUS | Status: AC
  Administered 2023-09-28 (×2): 2 g via INTRAVENOUS
  Filled 2023-09-28 (×2): qty 100

## 2023-09-28 MED ORDER — ACETAMINOPHEN 160 MG/5ML PO SOLN: 1000.0000 mg | Freq: Four times a day (QID) | ORAL | Status: DC

## 2023-09-28 MED ORDER — ACETAMINOPHEN 500 MG PO TABS
1000.0000 mg | ORAL_TABLET | Freq: Four times a day (QID) | ORAL | Status: DC
  Administered 2023-09-28 – 2023-10-01 (×9): 1000 mg via ORAL
  Filled 2023-09-28 (×10): qty 2

## 2023-09-28 MED ORDER — SPIRONOLACTONE 12.5 MG HALF TABLET
12.5000 mg | ORAL_TABLET | Freq: Every day | ORAL | Status: DC
Start: 2023-09-28 — End: 2023-09-30
  Administered 2023-09-28 – 2023-09-29 (×2): 12.5 mg via ORAL
  Filled 2023-09-28 (×2): qty 1

## 2023-09-28 MED ORDER — FENTANYL CITRATE (PF) 250 MCG/5ML IJ SOLN
INTRAMUSCULAR | Status: AC
  Filled 2023-09-28: qty 5

## 2023-09-28 MED ORDER — INSULIN ASPART 100 UNIT/ML IJ SOLN
0.0000 [IU] | INTRAMUSCULAR | Status: DC
  Administered 2023-09-28: 2 [IU] via SUBCUTANEOUS
  Administered 2023-09-28: 4 [IU] via SUBCUTANEOUS
  Administered 2023-09-28: 2 [IU] via SUBCUTANEOUS
  Administered 2023-09-28 – 2023-09-29 (×2): 4 [IU] via SUBCUTANEOUS
  Administered 2023-09-29 (×2): 2 [IU] via SUBCUTANEOUS
  Administered 2023-09-29: 4 [IU] via SUBCUTANEOUS
  Administered 2023-09-29 – 2023-09-30 (×2): 2 [IU] via SUBCUTANEOUS

## 2023-09-28 MED ORDER — DIGOXIN 125 MCG PO TABS
0.0625 mg | ORAL_TABLET | Freq: Every day | ORAL | Status: DC
  Administered 2023-09-29 – 2023-10-01 (×3): 0.0625 mg via ORAL
  Filled 2023-09-28 (×3): qty 1

## 2023-09-28 MED ORDER — FENTANYL CITRATE (PF) 100 MCG/2ML IJ SOLN
25.0000 ug | INTRAMUSCULAR | Status: DC | PRN
  Administered 2023-09-28 (×3): 25 ug via INTRAVENOUS

## 2023-09-28 MED ORDER — MIDAZOLAM HCL 2 MG/2ML IJ SOLN
INTRAMUSCULAR | Status: DC | PRN
  Administered 2023-09-28: .5 mg via INTRAVENOUS

## 2023-09-28 MED ORDER — PROPOFOL 10 MG/ML IV BOLUS
INTRAVENOUS | Status: DC | PRN
  Administered 2023-09-28: 80 mg via INTRAVENOUS

## 2023-09-28 MED ORDER — ROCURONIUM BROMIDE 10 MG/ML (PF) SYRINGE
PREFILLED_SYRINGE | INTRAVENOUS | Status: DC | PRN
  Administered 2023-09-28: 5 mg via INTRAVENOUS
  Administered 2023-09-28: 60 mg via INTRAVENOUS
  Administered 2023-09-28: 10 mg via INTRAVENOUS

## 2023-09-28 MED ORDER — PHENYLEPHRINE HCL-NACL 20-0.9 MG/250ML-% IV SOLN
INTRAVENOUS | Status: DC | PRN
  Administered 2023-09-28: 25 ug/min via INTRAVENOUS

## 2023-09-28 MED ORDER — BUPIVACAINE LIPOSOME 1.3 % IJ SUSP
INTRAMUSCULAR | Status: AC
  Filled 2023-09-28: qty 20

## 2023-09-28 MED ORDER — MORPHINE SULFATE (PF) 2 MG/ML IV SOLN
2.0000 mg | INTRAVENOUS | Status: DC | PRN
  Administered 2023-09-28: 2 mg via INTRAVENOUS
  Filled 2023-09-28: qty 1

## 2023-09-28 MED ORDER — FUROSEMIDE 40 MG PO TABS: 40.0000 mg | ORAL_TABLET | ORAL | Status: DC

## 2023-09-28 MED ORDER — LIDOCAINE 2% (20 MG/ML) 5 ML SYRINGE
INTRAMUSCULAR | Status: AC
Start: 2023-09-28 — End: 2023-09-28
  Filled 2023-09-28: qty 5

## 2023-09-28 MED ORDER — METOPROLOL SUCCINATE ER 25 MG PO TB24: 25.0000 mg | ORAL_TABLET | Freq: Every morning | ORAL | Status: DC

## 2023-09-28 MED ORDER — DEXAMETHASONE SODIUM PHOSPHATE 10 MG/ML IJ SOLN
INTRAMUSCULAR | Status: AC
  Filled 2023-09-28: qty 1

## 2023-09-28 MED ORDER — METOPROLOL SUCCINATE ER 25 MG PO TB24
25.0000 mg | ORAL_TABLET | Freq: Two times a day (BID) | ORAL | Status: DC
  Filled 2023-09-28: qty 1

## 2023-09-28 MED ORDER — PHENYLEPHRINE 80 MCG/ML (10ML) SYRINGE FOR IV PUSH (FOR BLOOD PRESSURE SUPPORT)
PREFILLED_SYRINGE | INTRAVENOUS | Status: AC
  Filled 2023-09-28: qty 10

## 2023-09-28 MED ORDER — CHLORHEXIDINE GLUCONATE 0.12 % MT SOLN
15.0000 mL | Freq: Once | OROMUCOSAL | Status: AC
  Administered 2023-09-28: 15 mL via OROMUCOSAL
  Filled 2023-09-28: qty 15

## 2023-09-28 MED ORDER — ROCURONIUM BROMIDE 10 MG/ML (PF) SYRINGE
PREFILLED_SYRINGE | INTRAVENOUS | Status: AC
  Filled 2023-09-28: qty 10

## 2023-09-28 MED ORDER — MORPHINE SULFATE (PF) 2 MG/ML IV SOLN
4.0000 mg | INTRAVENOUS | Status: DC | PRN
  Administered 2023-09-28 – 2023-09-29 (×5): 4 mg via INTRAVENOUS
  Filled 2023-09-28 (×6): qty 2

## 2023-09-28 MED ORDER — LACTATED RINGERS IV SOLN: INTRAVENOUS | Status: DC

## 2023-09-28 MED ORDER — 0.9 % SODIUM CHLORIDE (POUR BTL) OPTIME
TOPICAL | Status: DC | PRN
  Administered 2023-09-28: 2000 mL

## 2023-09-28 MED ORDER — BISACODYL 5 MG PO TBEC
10.0000 mg | DELAYED_RELEASE_TABLET | Freq: Every day | ORAL | Status: DC
  Administered 2023-09-29 – 2023-09-30 (×2): 10 mg via ORAL
  Filled 2023-09-28 (×4): qty 2

## 2023-09-28 MED ORDER — BUPIVACAINE HCL (PF) 0.5 % IJ SOLN
INTRAMUSCULAR | Status: AC
  Filled 2023-09-28: qty 30

## 2023-09-28 MED ORDER — PROPOFOL 10 MG/ML IV BOLUS
INTRAVENOUS | Status: AC
  Filled 2023-09-28: qty 20

## 2023-09-28 MED ORDER — TRAMADOL HCL 50 MG PO TABS
50.0000 mg | ORAL_TABLET | Freq: Four times a day (QID) | ORAL | Status: DC | PRN
  Administered 2023-09-28: 100 mg via ORAL
  Filled 2023-09-28: qty 2

## 2023-09-28 MED ORDER — SENNOSIDES-DOCUSATE SODIUM 8.6-50 MG PO TABS
1.0000 | ORAL_TABLET | Freq: Every day | ORAL | Status: DC
  Administered 2023-09-28 – 2023-09-30 (×3): 1 via ORAL
  Filled 2023-09-28 (×3): qty 1

## 2023-09-28 MED ORDER — BUPIVACAINE LIPOSOME 1.3 % IJ SUSP
INTRAMUSCULAR | Status: DC | PRN
  Administered 2023-09-28: 50 mL

## 2023-09-28 MED ORDER — CEFAZOLIN SODIUM-DEXTROSE 2-4 GM/100ML-% IV SOLN
2.0000 g | INTRAVENOUS | Status: AC
  Administered 2023-09-28: 2 g via INTRAVENOUS
  Filled 2023-09-28: qty 100

## 2023-09-28 MED ORDER — FENTANYL CITRATE (PF) 100 MCG/2ML IJ SOLN
INTRAMUSCULAR | Status: AC
Start: 2023-09-28 — End: 2023-09-28
  Filled 2023-09-28: qty 2

## 2023-09-28 MED ORDER — ATORVASTATIN CALCIUM 10 MG PO TABS
20.0000 mg | ORAL_TABLET | Freq: Every evening | ORAL | Status: DC
  Administered 2023-09-28 – 2023-09-30 (×2): 20 mg via ORAL
  Filled 2023-09-28 (×2): qty 2

## 2023-09-28 MED ORDER — SUGAMMADEX SODIUM 200 MG/2ML IV SOLN
INTRAVENOUS | Status: DC | PRN
  Administered 2023-09-28: 200 mg via INTRAVENOUS

## 2023-09-28 MED ORDER — CEFAZOLIN SODIUM-DEXTROSE 2-4 GM/100ML-% IV SOLN
INTRAVENOUS | Status: AC
  Filled 2023-09-28: qty 100

## 2023-09-28 MED ORDER — ORAL CARE MOUTH RINSE: 15.0000 mL | OROMUCOSAL | Status: DC | PRN

## 2023-09-28 MED ORDER — LIDOCAINE 2% (20 MG/ML) 5 ML SYRINGE
INTRAMUSCULAR | Status: DC | PRN
  Administered 2023-09-28: 40 mg via INTRAVENOUS

## 2023-09-28 MED ORDER — DEXTROSE-SODIUM CHLORIDE 5-0.45 % IV SOLN: INTRAVENOUS | Status: DC

## 2023-09-28 SURGICAL SUPPLY — 65 items
BLADE CLIPPER SURG (BLADE) ×2 IMPLANT
BLADE STERNUM SYSTEM 6 (BLADE) ×1 IMPLANT
BLADE SURG 11 STRL SS (BLADE) ×1 IMPLANT
CANISTER SUCTION 3000ML PPV (SUCTIONS) ×4 IMPLANT
CAP LEAD ASSY DEFIB 4033A (Neurosurgery Supplies) ×1 IMPLANT
CATH THORACIC 28FR (CATHETERS) ×2 IMPLANT
CATH THORACIC 36FR (CATHETERS) IMPLANT
CATH THORACIC 36FR RT ANG (CATHETERS) IMPLANT
CLIP TI MEDIUM 6 (CLIP) ×2 IMPLANT
CLIP TI WIDE RED SMALL 6 (CLIP) ×2 IMPLANT
CONN ST 1/4X3/8 BEN (MISCELLANEOUS) ×1 IMPLANT
CONN Y 3/8X3/8X3/8 BEN (MISCELLANEOUS) IMPLANT
COVER SURGICAL LIGHT HANDLE (MISCELLANEOUS) ×2 IMPLANT
DERMABOND ADVANCED .7 DNX12 (GAUZE/BANDAGES/DRESSINGS) ×1 IMPLANT
DRAIN CHANNEL 28F RND 3/8 FF (WOUND CARE) ×1 IMPLANT
DRAPE C-ARM 42X72 X-RAY (DRAPES) IMPLANT
DRAPE HALF SHEET 40X57 (DRAPES) ×4 IMPLANT
DRAPE INCISE IOBAN 66X45 STRL (DRAPES) ×2 IMPLANT
ELECTRODE BLDE 4.0 EZ CLN MEGD (MISCELLANEOUS) ×1 IMPLANT
ELECTRODE REM PT RTRN 9FT ADLT (ELECTROSURGICAL) ×2 IMPLANT
GAUZE 4X4 16PLY ~~LOC~~+RFID DBL (SPONGE) ×1 IMPLANT
GAUZE SPONGE 4X4 12PLY STRL (GAUZE/BANDAGES/DRESSINGS) ×2 IMPLANT
GOWN STRL REUS W/ TWL LRG LVL3 (GOWN DISPOSABLE) ×4 IMPLANT
GOWN STRL REUS W/ TWL XL LVL3 (GOWN DISPOSABLE) ×2 IMPLANT
HEMOSTAT SURGICEL 2X14 (HEMOSTASIS) ×1 IMPLANT
ICD GALLANT HF CRT-D DF-1 IS-1 (ICD Generator) ×1 IMPLANT
IMPL BIOMEC 54 ~~LOC~~ (Pacemaker) ×2 IMPLANT
KIT BASIN OR (CUSTOM PROCEDURE TRAY) ×2 IMPLANT
KIT SUCTION CATH 14FR (SUCTIONS) ×1 IMPLANT
KIT TURNOVER KIT B (KITS) ×2 IMPLANT
KIT WRENCH PACEMAKER ASSEM (MISCELLANEOUS) ×1 IMPLANT
NDL HYPO 25GX1X1/2 BEV (NEEDLE) IMPLANT
NEEDLE HYPO 25GX1X1/2 BEV (NEEDLE) ×2 IMPLANT
NS IRRIG 1000ML POUR BTL (IV SOLUTION) ×6 IMPLANT
PACK CHEST (CUSTOM PROCEDURE TRAY) ×2 IMPLANT
PACK SRG BSC III STRL LF ECLPS (CUSTOM PROCEDURE TRAY) ×2 IMPLANT
PAD ARMBOARD POSITIONER FOAM (MISCELLANEOUS) ×4 IMPLANT
PASSER SUT SWANSON 36MM LOOP (INSTRUMENTS) IMPLANT
POUCH AIGIS-R ANTIBACT ICD LRG (Mesh General) ×1 IMPLANT
SEALANT SURG COSEAL 4ML (VASCULAR PRODUCTS) IMPLANT
SEALANT SURG COSEAL 8ML (VASCULAR PRODUCTS) IMPLANT
SPONGE T-LAP 18X18 ~~LOC~~+RFID (SPONGE) ×4 IMPLANT
SPONGE T-LAP 4X18 ~~LOC~~+RFID (SPONGE) ×1 IMPLANT
SUT PROLENE 3 0 SH DA (SUTURE) IMPLANT
SUT SILK 1 MH (SUTURE) ×2 IMPLANT
SUT SILK 2 0 SH CR/8 (SUTURE) ×1 IMPLANT
SUT SILK 2 0SH CR/8 30 (SUTURE) ×2 IMPLANT
SUT SILK 2-0 18XBRD TIE 12 (SUTURE) ×2 IMPLANT
SUT SILK 3 0SH CR/8 30 (SUTURE) IMPLANT
SUT VIC AB 1 CTX36XBRD ANBCTR (SUTURE) ×3 IMPLANT
SUT VIC AB 2-0 CT1 TAPERPNT 27 (SUTURE) ×1 IMPLANT
SUT VIC AB 2-0 CTX 36 (SUTURE) ×2 IMPLANT
SUT VIC AB 2-0 UR6 27 (SUTURE) IMPLANT
SUT VIC AB 3-0 MH 27 (SUTURE) IMPLANT
SUT VIC AB 3-0 SH 27X BRD (SUTURE) ×1 IMPLANT
SUT VIC AB 3-0 X1 27 (SUTURE) ×3 IMPLANT
SYR 20ML LL LF (SYRINGE) ×1 IMPLANT
SYSTEM SAHARA CHEST DRAIN ATS (WOUND CARE) ×2 IMPLANT
TAPE CLOTH 4X10 WHT NS (GAUZE/BANDAGES/DRESSINGS) ×1 IMPLANT
TOWEL GREEN STERILE (TOWEL DISPOSABLE) ×4 IMPLANT
TOWEL GREEN STERILE FF (TOWEL DISPOSABLE) ×2 IMPLANT
TRAY FOLEY MTR SLVR 14FR STAT (SET/KITS/TRAYS/PACK) ×2 IMPLANT
TRAY FOLEY MTR SLVR 16FR STAT (SET/KITS/TRAYS/PACK) ×2 IMPLANT
TUNNELER SHEATH ON-Q 11GX8 DSP (PAIN MANAGEMENT) IMPLANT
WATER STERILE IRR 1000ML POUR (IV SOLUTION) ×4 IMPLANT

## 2023-09-28 NOTE — Brief Op Note (Signed)
 09/28/2023  10:13 AM  PATIENT:  [name] [name]  73 y.o. female  PRE-OPERATIVE DIAGNOSIS:  Chronic systolic heart failure due to nonischemic cardiomyopathy  POST-OPERATIVE DIAGNOSIS:  Chronic systolic heart failure due to nonischemic cardiomyopathy  PROCEDURE:  Procedure(s): THORACOTOMY, MAJOR (Left) INSERTION, EPICARDIAL ELECTRODE LEAD (Left) Revision of Pacer/ICD to biventricular system  SURGEON:  Surgeons and Role:    * Lucas Dorise POUR, MD - Primary  PHYSICIAN ASSISTANT: none  ASSISTANTS: RNFA   ANESTHESIA:   general  EBL:  25 mL   BLOOD ADMINISTERED:none  DRAINS: 17F  Chest Tube(s) in the left plerual space   LOCAL MEDICATIONS USED:  MARCAINE      SPECIMEN:  No Specimen  DISPOSITION OF SPECIMEN:  N/A  COUNTS:  YES  TOURNIQUET:  * No tourniquets in log *  DICTATION: .Note written in EPIC  PLAN OF CARE: Admit to inpatient   PATIENT DISPOSITION:  PACU - hemodynamically stable.   Delay start of Pharmacological VTE agent (>24hrs) due to surgical blood loss or risk of bleeding: no

## 2023-09-28 NOTE — Progress Notes (Signed)
 EVENING ROUNDS NOTE :     [address]       [place] [address]             [phone]                 * Day of Surgery * Procedure(s) (LRB): THORACOTOMY, MAJOR (Left) INSERTION, EPICARDIAL ELECTRODE LEAD (Left)   Total Length of Stay:  LOS: 0 days  Events:   No events Minimal CT output    BP 100/60   Pulse 66   Temp 98.4 F (36.9 C)   Resp 20   Ht 5' 8 (1.727 m)   Wt 71.2 kg   SpO2 96%   BMI 23.87 kg/m           ceFAZolin  (ANCEF ) IV Stopped (09/28/23 1211)   dextrose  5 % and 0.45 % NaCl 50 mL/hr at 09/28/23 1400   vancomycin       No intake/output data recorded.      Latest Ref Rng & Units 09/24/2023   10:40 AM 07/28/2023   11:47 AM 06/09/2023    1:35 PM  CBC  WBC 4.0 - 10.5 K/uL 7.8  6.9  6.0   Hemoglobin 12.0 - 15.0 g/dL 87.4  87.3  87.7   Hematocrit 36.0 - 46.0 % 38.4  38.2  38.2   Platelets 150 - 400 K/uL 112  116  127        Latest Ref Rng & Units 09/24/2023   10:40 AM 07/28/2023   11:47 AM 07/30/2022   12:34 PM  BMP  Glucose 70 - 99 mg/dL 887  94  882   BUN 8 - 23 mg/dL 26  33  30   Creatinine 0.44 - 1.00 mg/dL 8.87  8.98  8.97   BUN/Creat Ratio 12 - 28   29   Sodium 135 - 145 mmol/L 137  138  141   Potassium 3.5 - 5.1 mmol/L 3.9  4.0  4.2   Chloride 98 - 111 mmol/L 103  102  104   CO2 22 - 32 mmol/L 26  26  23    Calcium  8.9 - 10.3 mg/dL 9.5  9.8  9.9     ABG    Component Value Date/Time   PHART 7.480 (H) 03/09/2008 1625   PCO2ART 31.1 (L) 03/09/2008 1625   PO2ART 74.2 (L) 03/09/2008 1625   HCO3 22.9 03/09/2008 1625   TCO2 23.8 03/09/2008 1625   ACIDBASEDEF 0.3 03/09/2008 1625   O2SAT 95.5 03/09/2008 1625       Merlen Gurry, MD 09/28/2023 4:05 PM

## 2023-09-28 NOTE — Interval H&P Note (Signed)
 History and Physical Interval Note:  09/28/2023 6:36 AM  [name] [name]  has presented today for surgery, with the diagnosis of Chronic systolic heart failure due to nonischemic cardiomyopathy.  The various methods of treatment have been discussed with the patient and family. After consideration of risks, benefits and other options for treatment, the patient has consented to  Procedure(s): THORACOTOMY, MAJOR (Left) INSERTION, EPICARDIAL ELECTRODE LEAD (Left) ECHOCARDIOGRAM, TRANSESOPHAGEAL (N/A) as a surgical intervention.  The patient's history has been reviewed, patient examined, no change in status, stable for surgery.  I have reviewed the patient's chart and labs.  Questions were answered to the patient's satisfaction.     [name] K Aalivia Mcgraw

## 2023-09-28 NOTE — Anesthesia Procedure Notes (Signed)
 Procedure Name: Intubation Date/Time: 09/28/2023 7:58 AM  Performed by: Vertie Arthea RAMAN, CRNAPre-anesthesia Checklist: Patient identified, Emergency Drugs available, Suction available and Patient being monitored Patient Re-evaluated:Patient Re-evaluated prior to induction Oxygen Delivery Method: Circle System Utilized Preoxygenation: Pre-oxygenation with 100% oxygen Induction Type: IV induction Ventilation: Mask ventilation without difficulty and Oral airway inserted - appropriate to patient size Laryngoscope Size: Cleotilde and 2 Grade View: Grade II Tube type: Oral Endobronchial tube: Left, EBT position confirmed by auscultation and EBT position confirmed by fiberoptic bronchoscope and 37 Fr Number of attempts: 1 Airway Equipment and Method: Stylet and Oral airway Placement Confirmation: ETT inserted through vocal cords under direct vision, positive ETCO2 and breath sounds checked- equal and bilateral Tube secured with: Tape Dental Injury: Teeth and Oropharynx as per pre-operative assessment

## 2023-09-28 NOTE — Anesthesia Procedure Notes (Signed)
 Arterial Line Insertion Start/End8/18/2025 7:07 AM, 09/28/2023 7:20 AM Performed by: CRNA  Patient location: Pre-op . Preanesthetic checklist: patient identified, IV checked, site marked, risks and benefits discussed, surgical consent, monitors and equipment checked, pre-op  evaluation, timeout performed and anesthesia consent Lidocaine  1% used for infiltration Right, radial was placed Catheter size: 20 G Hand hygiene performed  and maximum sterile barriers used   Attempts: 2 Procedure performed using ultrasound guided technique. Following insertion, dressing applied and Biopatch. Post procedure assessment: normal  Patient tolerated the procedure well with no immediate complications.

## 2023-09-28 NOTE — Transfer of Care (Signed)
 Immediate Anesthesia Transfer of Care Note  Patient: [name] [name]  Procedure(s) Performed: THORACOTOMY, MAJOR (Left: Chest) INSERTION, EPICARDIAL ELECTRODE LEAD (Left: Chest)  Patient Location: PACU  Anesthesia Type:General  Level of Consciousness: awake, alert , and oriented  Airway & Oxygen Therapy: Patient Spontanous Breathing and Patient connected to nasal cannula oxygen  Post-op Assessment: Report given to RN, Post -op Vital signs reviewed and stable, and Patient moving all extremities X 4  Post vital signs: Reviewed and stable  Last Vitals:  Vitals Value Taken Time  BP 118/65 09/28/23 10:30  Temp    Pulse 67 09/28/23 10:36  Resp 20 09/28/23 10:36  SpO2 94 % 09/28/23 10:36  Vitals shown include unfiled device data.  Last Pain:  Vitals:   09/28/23 0610  PainSc: 2       Patients Stated Pain Goal: 0 (09/28/23 0610)  Complications: No notable events documented.

## 2023-09-28 NOTE — Op Note (Signed)
 CARDIOVASCULAR SURGERY OPERATIVE NOTE  09/28/2023  Surgeon:  Dorise LOIS Fellers, MD  First Assistant: RNFA  Preoperative Diagnosis:  Chronic systolic heart failure, non-ischemic cardiomyopathy with LBBB morphology  Postoperative Diagnosis:  Same   Procedure:  Left Thoracotomy with insertion of LV epicardial pacing leads and revision to biventricular pacing system with generator changeout.   Anesthesia:  General Endotracheal   Clinical History/Surgical Indication:  This 73 year old woman has chronic systolic heart failure due to nonischemic cardiomyopathy with left bundle branch block morphology. Her most recent echocardiogram in May 2025 showed a left ventricular ejection fraction of 30 to 35%. She has NYHA class II symptoms of exertional fatigue and shortness of breath as well as lower extremity edema. An upper extremity venogram showed an occluded left subclavian and axillary vein. It was felt that the alternatives would be epicardial lead implant on the LV versus a right subclavian vein coronary sinus lead with tunneling to the left chest. Dr. Cindie was concerned about the right sided veins and felt that she should be evaluated for epicardial lead implant. I agree that upgrade to a BiV device is the best option for trying to improve her symptoms and left ventricular ejection fraction. I think she is a good candidate for epicardial placement of a left ventricular pacing lead via small left thoracotomy. I discussed the operative procedure with the patient and her husband including alternatives, benefits and risks; including but not limited to bleeding, blood transfusion, infection, as well as the possibility that this may not improve her symptoms or ejection fraction. Cindia JONELLE Shams understands and agrees to proceed.   Preparation:  The patient was seen in the preoperative holding area and the  correct patient, correct operation were confirmed with the patient after reviewing the medical record. The consent was signed by me. Preoperative antibiotics were given. A radial arterial line was placed by the anesthesia team. The patient was taken back to the operating room and positioned supine on the operating room table. After being placed under general endotracheal anesthesia using a double lumen ETT by the anesthesia team a foley catheter was placed. A bump was placed under the left chest.The neck, chest, and abdomen were prepped with betadine  soap and solution and draped in the usual sterile manner. A surgical time-out was taken and the correct patient and operative procedure were confirmed with the nursing and anesthesia staff.   Left thoracotomy and insertion of LV epicardial leads.  A small left thoracotomy incision was made along the inferior border of the left pectoralis major muscle.  The intercostal space was opened along the superior border of the rib and the left lung was deflated.  The lateral pericardium was opened a few centimeters anterior to the left phrenic nerve to expose the high lateral left ventricle.  A [employer] bipolar screw-in lead (SN [id number]) was placed.  The R wave was 11.8 mV.  Threshold was 0.75 V at 0.5 ms.  Impedance was 500 ohms.  A second lead of the same type (SN [id number]) had slightly lower R wave and threshold.  Both leads were felt to be satisfactory but we decided to use [id number] and the other lead was capped.  Then a transverse incision was made above the current generator pocket.  This was carried down through the subtenons tissue using electrocautery and the pocket opened.  The generator was removed and the leads freed over a short distance.  The 2 new LV leads were brought through the intercostal space and tunneled subcutaneously  up to the generator pocket.  The old leads were then disconnected and reconnected to a [employer] HF device ( [id number] [id number], SN  [id number]) along with the new LV lead.  The new device was tested by the [employer] representative and felt to be functioning satisfactorily.  The generator was placed in a TYRX antimicrobial pouch and placed back in the subcutaneous pocket.  The leads were coiled behind the generator.  There was complete hemostasis.  The pocket and subcutaneous tissue was closed with continuous 2-0 Vicryl suture.  The skin was closed with 3-0 Vicryl subcuticular closure.  The pacing leads sites on the LV were examined and were hemostatic.  A 28 [place] Blake drain was brought through a separate stab incision and positioned posteriorly in the pleural space.  The ribs were reapproximated with #1 Vicryl pericostal sutures.  The chest wall muscle was approximated with continuous 0 Vicryl suture.  The subcutaneous tissue was closed with continuous 3-0 Vicryl suture and the skin with a 3-0 Vicryl subcuticular closure.  Dermabond was applied over both incisions followed by dry sterile dressing.  The chest tube was connected to Pleur-evac suction.  The left lung was reinflated.  The patient was awakened and extubated and transported to the postanesthesia care unit in satisfactory condition.  Dorise LOIS Fellers, MD

## 2023-09-28 NOTE — Hospital Course (Signed)
 History of Present Illness:  [name] [name] is a 73 yo female with history of familial nonischemic cardiomyopathy, bilateral mastectomy, amaurosis fugax on Xarelto  for previous LV thrombus who has an ICD in place. She is on GDMT with a left ventricular ejection fraction of 30 to 35% with global hypokinesis. There is moderate mitral regurgitation. Right ventricular systolic function is normal. She has developed exertional fatigue and shortness of breath as well as lower extremity edema and was evaluated by Dr. Cindie for BiV upgrade. An upper extremity venogram showed an occluded left subclavian and axillary vein. It was felt that the alternatives would be epicardial lead implant on the LV versus a right subclavian vein coronary sinus lead with tunneling to the left chest. Dr. Cindie was concerned about the right sided veins and felt that she should be evaluated for epicardial lead implant. She was evaluated by Dr. Lucas at which time she confirmed significant family history of Cardiomyopathy.  She stated one brother required transplant and the other was treated with a LVAD.  She reiterated her symptoms of fatigue, exertional shortness of breath, and LE edema.  He felt the patient would be a reasonable candidate for Epicardial lead placement via small thoracotomy.  The risks and benefits of the procedure were explained to the patient and she was agreeable to proceed.  Hospital Course:  [name] [name] presented to [facility] on 09/28/2023.  She was taken to the operating room and underwent Major Thoracotomy with placement of Left Epicardial Lead with revision and biventricular pacing generator change out.  She tolerated the procedure without difficulty, was extubated, and taken to the ICU in stable condition.  She was in NSR post operatively.  Her blood pressure was initially low and her Lopressor  and Entresto  were not resumed.  Her chest tube was removed without difficulty.  She had a UA prior to  surgery which was treated with antibiotics.  Foley catheter was removed and repeat UA was obtained to ensure complete treatment.  This showed continued leukocytes and bacteria.  She was started on Bactrim  for additional treatment.  She developed AKI with creatinine level peaking at 2.10 this was felt to be due to diuretics and use of Entresto  as home medication.  She was stable and felt stable for transfer to the progressive care unit on 09/30/2023.  The patient continued to make progress.  Her blood pressure was too low to resume all of her home medications.  These will be resumed in a staged process as BP allows.  She had mild hypokalemia and her potassium was supplemented.  Her AKI resolved with repeat creatinine level being 1.33.  She is ambulating without difficulty.  Her surgical incisions were healing without evidence of infection.  She is medically stable for discharge home today.

## 2023-09-28 NOTE — Anesthesia Preprocedure Evaluation (Signed)
 Anesthesia Evaluation  Patient identified by MRN, date of birth, ID band Patient awake    Reviewed: Allergy & Precautions, NPO status , Patient's Chart, lab work & pertinent test results  History of Anesthesia Complications Negative for: history of anesthetic complications  Airway Mallampati: III  TM Distance: <3 FB Neck ROM: Full    Dental  (+) Teeth Intact, Dental Advisory Given   Pulmonary neg shortness of breath, neg sleep apnea, neg COPD, neg recent URI   breath sounds clear to auscultation       Cardiovascular +CHF  + pacemaker + Cardiac Defibrillator  Rhythm:Regular   1. Left ventricular ejection fraction, by estimation, is 30 to 35%. The  left ventricle has moderately decreased function. The left ventricle  demonstrates global hypokinesis. The left ventricular internal cavity size  was moderately dilated. Left  ventricular diastolic parameters are consistent with Grade I diastolic  dysfunction (impaired relaxation).   2. Right ventricular systolic function is normal. The right ventricular  size is normal. There is normal pulmonary artery systolic pressure. The  estimated right ventricular systolic pressure is 24.0 mmHg.   3. Left atrial size was mildly dilated.   4. The mitral valve is normal in structure. Moderate mitral valve  regurgitation. No evidence of mitral stenosis.   5. The aortic valve is tricuspid. Aortic valve regurgitation is not  visualized. No aortic stenosis is present.   6. Pulmonic valve regurgitation is moderate.   7. The inferior vena cava is dilated in size with >50% respiratory  variability, suggesting right atrial pressure of 8 mmHg.     Neuro/Psych CVA, No Residual Symptoms    GI/Hepatic negative GI ROS, Neg liver ROS,,,  Endo/Other  negative endocrine ROS    Renal/GU Renal InsufficiencyRenal diseaseLab Results      Component                Value               Date                       NA                       137                 09/24/2023                K                        3.9                 09/24/2023                CO2                      26                  09/24/2023                GLUCOSE                  112 (H)             09/24/2023                BUN  26 (H)              09/24/2023                CREATININE               1.12 (H)            09/24/2023                CALCIUM                   9.5                 09/24/2023                GFR                      81.07               02/17/2012                EGFR                     59 (L)              07/30/2022                GFRNONAA                 52 (L)              09/24/2023                Musculoskeletal  (+) Arthritis ,    Abdominal   Peds  Hematology negative hematology ROS (+)   Anesthesia Other Findings   Reproductive/Obstetrics                              Anesthesia Physical Anesthesia Plan  ASA: 3  Anesthesia Plan: General   Post-op Pain Management: Ofirmev  IV (intra-op)*   Induction: Intravenous  PONV Risk Score and Plan: 4 or greater and Ondansetron  and Dexamethasone   Airway Management Planned: Double Lumen EBT  Additional Equipment: Arterial line  Intra-op Plan:   Post-operative Plan: Extubation in OR  Informed Consent: I have reviewed the patients History and Physical, chart, labs and discussed the procedure including the risks, benefits and alternatives for the proposed anesthesia with the patient or authorized representative who has indicated his/her understanding and acceptance.     Dental advisory given  Plan Discussed with: CRNA  Anesthesia Plan Comments:          Anesthesia Quick Evaluation

## 2023-09-29 ENCOUNTER — Encounter (HOSPITAL_COMMUNITY): Payer: Self-pay | Admitting: Surgery

## 2023-09-29 ENCOUNTER — Inpatient Hospital Stay (HOSPITAL_COMMUNITY)

## 2023-09-29 LAB — URINALYSIS, ROUTINE W REFLEX MICROSCOPIC
Bilirubin Urine: NEGATIVE
Glucose, UA: NEGATIVE mg/dL
Ketones, ur: NEGATIVE mg/dL
Nitrite: NEGATIVE
Protein, ur: 100 mg/dL — AB
RBC / HPF: >50 RBC/hpf (ref 0–5)
Specific Gravity, Urine: 1.023 (ref 1.005–1.030)
pH: 5 (ref 5.0–8.0)

## 2023-09-29 LAB — CBC
HCT: 34.5 % — ABNORMAL LOW (ref 36.0–46.0)
Hemoglobin: 11.2 g/dL — ABNORMAL LOW (ref 12.0–15.0)
MCH: 28.8 pg (ref 26.0–34.0)
MCHC: 32.5 g/dL (ref 30.0–36.0)
MCV: 88.7 fL (ref 80.0–100.0)
Platelets: 114 K/uL — ABNORMAL LOW (ref 150–400)
RBC: 3.89 MIL/uL (ref 3.87–5.11)
RDW: 14.8 % (ref 11.5–15.5)
WBC: 17.6 K/uL — ABNORMAL HIGH (ref 4.0–10.5)
nRBC: 0 % (ref 0.0–0.2)

## 2023-09-29 LAB — BASIC METABOLIC PANEL WITH GFR
Anion gap: 10 (ref 5–15)
BUN: 27 mg/dL — ABNORMAL HIGH (ref 8–23)
CO2: 19 mmol/L — ABNORMAL LOW (ref 22–32)
Calcium: 7.9 mg/dL — ABNORMAL LOW (ref 8.9–10.3)
Chloride: 101 mmol/L (ref 98–111)
Creatinine, Ser: 1.58 mg/dL — ABNORMAL HIGH (ref 0.44–1.00)
GFR, Estimated: 35 mL/min — ABNORMAL LOW (ref >60)
Glucose, Bld: 432 mg/dL — ABNORMAL HIGH (ref 70–99)
Potassium: 4.4 mmol/L (ref 3.5–5.1)
Sodium: 130 mmol/L — ABNORMAL LOW (ref 135–145)

## 2023-09-29 LAB — GLUCOSE, CAPILLARY
Glucose-Capillary: 160 mg/dL — ABNORMAL HIGH (ref 70–99)
Glucose-Capillary: 160 mg/dL — ABNORMAL HIGH (ref 70–99)
Glucose-Capillary: 168 mg/dL — ABNORMAL HIGH (ref 70–99)
Glucose-Capillary: 176 mg/dL — ABNORMAL HIGH (ref 70–99)
Glucose-Capillary: 153 mg/dL — ABNORMAL HIGH (ref 70–99)
Glucose-Capillary: 117 mg/dL — ABNORMAL HIGH (ref 70–99)

## 2023-09-29 LAB — SURGICAL PATHOLOGY

## 2023-09-29 MED ORDER — CHLORHEXIDINE GLUCONATE CLOTH 2 % EX PADS
6.0000 | MEDICATED_PAD | Freq: Every day | CUTANEOUS | Status: DC
  Administered 2023-09-29 – 2023-09-30 (×2): 6 via TOPICAL

## 2023-09-29 NOTE — Progress Notes (Signed)
 Patient ID: [name] [name], female   DOB: Jul 07, 1950, 73 y.o.   MRN: [id number]  TCTS Evening Rounds:  Hemodynamically stable in sinus rhythm.  Feels much better after chest tube removal.  Walked small loop today.  Continue IS, ambulation.

## 2023-09-29 NOTE — Discharge Summary (Signed)
 [address] [place] [address]             [phone]    Physician Discharge Summary  Patient ID: [name] [name] MRN: [id number] DOB/AGE: 1950-11-14 73 y.o.  Admit date: 09/28/2023 Discharge date: 10/01/2023  Admission Diagnoses:  Patient Active Problem List   Diagnosis Date Noted   AF (amaurosis fugax) 06/10/2023   History of colonic polyps 09/09/2022   NSVT (nonsustained ventricular tachycardia) (HCC) 07/09/2021   S/P left mastectomy 04/05/2021   Pneumonia due to COVID-19 virus 02/23/2019   Acute respiratory failure (HCC) 02/19/2019   History of cardioembolic cerebrovascular accident (CVA) on anticoagulation. 02/19/2019   COVID-19 virus infection 02/19/2019   History of reconstruction of right breast 05/31/2015   Long term (current) use of anticoagulants 05/06/2010   SYSTOLIC HEART FAILURE, CHRONIC 06/12/2009   ICD (implantable cardioverter-defibrillator), dual, in situ 03/06/2009   Congestive dilated cardiomyopathy (HCC) 05/18/2008   HYPOTENSION, CHRONIC 05/18/2008   NECK PAIN 04/11/2008   FATIGUE / MALAISE 03/16/2008   Discharge Diagnoses:  Patient Active Problem List   Diagnosis Date Noted   S/P thoracotomy 09/28/2023   AF (amaurosis fugax) 06/10/2023   History of colonic polyps 09/09/2022   NSVT (nonsustained ventricular tachycardia) (HCC) 07/09/2021   S/P left mastectomy 04/05/2021   Pneumonia due to COVID-19 virus 02/23/2019   Acute respiratory failure (HCC) 02/19/2019   History of cardioembolic cerebrovascular accident (CVA) on anticoagulation. 02/19/2019   COVID-19 virus infection 02/19/2019   History of reconstruction of right breast 05/31/2015   Long term (current) use of anticoagulants 05/06/2010   SYSTOLIC HEART FAILURE, CHRONIC 06/12/2009   ICD (implantable cardioverter-defibrillator), dual, in situ 03/06/2009   Congestive dilated cardiomyopathy (HCC) 05/18/2008   HYPOTENSION, CHRONIC 05/18/2008   NECK PAIN  04/11/2008   FATIGUE / MALAISE 03/16/2008   Discharged Condition: good  History of Present Illness:  [name] [name] is a 73 yo female with history of familial nonischemic cardiomyopathy, bilateral mastectomy, amaurosis fugax on Xarelto  for previous LV thrombus who has an ICD in place. She is on GDMT with a left ventricular ejection fraction of 30 to 35% with global hypokinesis. There is moderate mitral regurgitation. Right ventricular systolic function is normal. She has developed exertional fatigue and shortness of breath as well as lower extremity edema and was evaluated by Dr. Cindie for BiV upgrade. An upper extremity venogram showed an occluded left subclavian and axillary vein. It was felt that the alternatives would be epicardial lead implant on the LV versus a right subclavian vein coronary sinus lead with tunneling to the left chest. Dr. Cindie was concerned about the right sided veins and felt that she should be evaluated for epicardial lead implant. She was evaluated by Dr. Lucas at which time she confirmed significant family history of Cardiomyopathy.  She stated one brother required transplant and the other was treated with a LVAD.  She reiterated her symptoms of fatigue, exertional shortness of breath, and LE edema.  He felt the patient would be a reasonable candidate for Epicardial lead placement via small thoracotomy.  The risks and benefits of the procedure were explained to the patient and she was agreeable to proceed.  Hospital Course:  [name] [name] presented to [facility] on 09/28/2023.  She was taken to the operating room and underwent Major Thoracotomy with placement of Left Epicardial Lead with revision and biventricular pacing generator change out.  She tolerated the procedure without difficulty,  was extubated, and taken to the ICU in stable condition.  She was in NSR post operatively.  Her blood pressure was initially low and her Lopressor  and Entresto  were not  resumed.  Her chest tube was removed without difficulty.  She had a UA prior to surgery which was treated with antibiotics.  Foley catheter was removed and repeat UA was obtained to ensure complete treatment.  This showed continued leukocytes and bacteria.  She was started on Bactrim  for additional treatment.  She developed AKI with creatinine level peaking at 2.10 this was felt to be due to diuretics and use of Entresto  as home medication.  She was stable and felt stable for transfer to the progressive care unit on 09/30/2023.  The patient continued to make progress.  Her blood pressure was too low to resume all of her home medications.  These will be resumed in a staged process as BP allows.  She had mild hypokalemia and her potassium was supplemented.  Her AKI resolved with repeat creatinine level being 1.33.  She is ambulating without difficulty.  Her surgical incisions were healing without evidence of infection.  She is medically stable for discharge home today.  Consults: None   Treatments: surgery:   09/28/2023   Surgeon:  Dorise LOIS Fellers, MD   First Assistant: RNFA   Preoperative Diagnosis:  Chronic systolic heart failure, non-ischemic cardiomyopathy with LBBB morphology   Postoperative Diagnosis:  Same     Procedure:   Left Thoracotomy with insertion of LV epicardial pacing leads and revision to biventricular pacing system with generator changeout.     Discharge Exam: Blood pressure (!) 106/58, pulse 89, temperature 98.8 F (37.1 C), temperature source Oral, resp. rate 20, height 5' 8 (1.727 m), weight 72.3 kg, SpO2 97%.  General appearance: alert, cooperative, and no distress Heart: regular rate and rhythm Lungs: clear to auscultation bilaterally Abdomen: soft, non-tender; bowel sounds normal; no masses,  no organomegaly Extremities: extremities normal, atraumatic, no cyanosis or edema Wound: clean and dry  Discharge Medications:   Allergies as of 10/01/2023   No Known  Allergies      Medication List     STOP taking these medications    Entresto  97-103 MG Generic drug: sacubitril -valsartan    spironolactone  25 MG tablet Commonly known as: ALDACTONE        TAKE these medications    Accu-Chek Guide Test test strip Generic drug: glucose blood daily.   Accu-Chek Softclix Lancets lancets daily.   acetaminophen  500 MG tablet Commonly known as: TYLENOL  Take 1-2 tablets (500-1,000 mg total) by mouth every 6 (six) hours as needed.   anastrozole  1 MG tablet Commonly known as: ARIMIDEX  Take 1 mg by mouth in the morning.   atorvastatin  20 MG tablet Commonly known as: Lipitor Take 1 tablet (20 mg total) by mouth daily. What changed: when to take this   cholecalciferol  25 MCG (1000 UNIT) tablet Commonly known as: VITAMIN D3 Take 1,000 Units by mouth daily.   COLLAGEN PO Take 1 Scoop by mouth daily.   digoxin  0.125 MG tablet Commonly known as: LANOXIN  Take 0.5 tablets (0.0625 mg total) by mouth daily.   empagliflozin  10 MG Tabs tablet Commonly known as: Jardiance  Take 1 tablet (10 mg total) by mouth daily before breakfast.   furosemide  40 MG tablet Commonly known as: LASIX  Take 40-60 mg by mouth See admin instructions. Take 40 mg by mouth every other day, alternating with 60 mg on alternate days.   metoprolol  succinate 25 MG 24  hr tablet Commonly known as: Toprol  XL Take 1 tablet (25 mg total) by mouth in the morning AND 2 tablets (50 mg total) at bedtime. What changed: See the new instructions.   multivitamin with minerals Tabs tablet Take 1 tablet by mouth in the morning.   multivitamin-lutein Caps capsule Take 1 capsule by mouth daily.   rivaroxaban  20 MG Tabs tablet Commonly known as: Xarelto  Take 1 tablet (20 mg total) by mouth daily with supper.   sulfamethoxazole -trimethoprim  800-160 MG tablet Commonly known as: Bactrim  DS Take 1 tablet by mouth 2 (two) times daily for 3 days.   traMADol  50 MG tablet Commonly  known as: ULTRAM  Take 1 tablet (50 mg total) by mouth every 6 (six) hours as needed (mild pain).         SignedBETHA Rocky Shad, PA-C  10/01/2023, 8:24 AM

## 2023-09-29 NOTE — Progress Notes (Signed)
 1 Day Post-Op Procedure(s) (LRB): THORACOTOMY, MAJOR (Left) INSERTION, EPICARDIAL ELECTRODE LEAD (Left) Subjective: Complains of chest wall pain. Rough night. Sitting up this am.  Objective: Vital signs in last 24 hours: Temp:  [97.8 F (36.6 C)-98.5 F (36.9 C)] 98.5 F (36.9 C) (08/19 0400) Pulse Rate:  [60-96] 68 (08/19 0630) Cardiac Rhythm: A-V Sequential paced (08/19 0020) Resp:  [10-31] 18 (08/19 0630) BP: (87-118)/(54-95) 92/60 (08/19 0630) SpO2:  [90 %-96 %] 95 % (08/19 0630) Arterial Line BP: (92-143)/(43-72) 125/63 (08/18 1700)  Hemodynamic parameters for last 24 hours:    Intake/Output from previous day: 08/18 0701 - 08/19 0700 In: 2371.1 [I.V.:1770.9; IV Piggyback:600.2] Out: 1675 [Urine:1230; Blood:25; Chest Tube:420] Intake/Output this shift: No intake/output data recorded.  General appearance: alert and cooperative Heart: regular rate and rhythm Lungs: clear to auscultation bilaterally Extremities: edema mild Wound: dressings dry  Lab Results: Recent Labs    09/29/23 0236  WBC 17.6*  HGB 11.2*  HCT 34.5*  PLT 114*   BMET:  Recent Labs    09/29/23 0236  NA 130*  K 4.4  CL 101  CO2 19*  GLUCOSE 432*  BUN 27*  CREATININE 1.58*  CALCIUM  7.9*    PT/INR: No results for input(s): LABPROT, INR in the last 72 hours. ABG    Component Value Date/Time   PHART 7.480 (H) 03/09/2008 1625   HCO3 22.9 03/09/2008 1625   TCO2 23.8 03/09/2008 1625   ACIDBASEDEF 0.3 03/09/2008 1625   O2SAT 95.5 03/09/2008 1625   CBG (last 3)  Recent Labs    09/28/23 1951 09/28/23 2326 09/29/23 0352  GLUCAP 156* 192* 160*   CXR: mild left base atelectasis  Assessment/Plan: S/P Procedure(s) (LRB): THORACOTOMY, MAJOR (Left) INSERTION, EPICARDIAL ELECTRODE LEAD (Left)  POD 1  Hemodynamically stable with low normal BP. Will hold on Entresto  and Lopressor  today.  Slight bump in creat. Follow up tomorrow.  DC chest tube   Send UA and then DC foley. She  had abnormal UA preop and brief antibiotic course.  IS, ambulation.   LOS: 1 day    [name] [name] 09/29/2023

## 2023-09-29 NOTE — Discharge Instructions (Signed)
 Discharge Instructions:  1. You may shower, please wash incisions daily with soap and water and keep dry.  If you wish to cover wounds with dressing you may do so but please keep clean and change daily.  No tub baths or swimming until incisions have completely healed.  If your incisions become red or develop any drainage please call our office at [phone]  2. No Driving until cleared by Dr. Jeralyn office and you are no longer using narcotic pain medications  3. Monitor your weight daily.. Please use the same scale and weigh at same time... If you gain 5-10 lbs in 48 hours with associated lower extremity swelling, please contact our office at [phone]  4. Fever of 101.5 for at least 24 hours with no source, please contact our office at [phone]  5. Activity- up as tolerated, please walk at least 3 times per day.  Avoid strenuous activity, on affected side for a minimum of 6 weeks  6. If any questions or concerns arise, please do not hesitate to contact our office at [phone]

## 2023-09-29 NOTE — Anesthesia Postprocedure Evaluation (Signed)
 Anesthesia Post Note  Patient: [name] [name]  Procedure(s) Performed: THORACOTOMY, MAJOR (Left: Chest) INSERTION, EPICARDIAL ELECTRODE LEAD (Left: Chest)     Patient location during evaluation: PACU Anesthesia Type: General Level of consciousness: awake and alert Pain management: pain level controlled Vital Signs Assessment: post-procedure vital signs reviewed and stable Respiratory status: spontaneous breathing, nonlabored ventilation and respiratory function stable Cardiovascular status: blood pressure returned to baseline and stable Postop Assessment: no apparent nausea or vomiting Anesthetic complications: no   No notable events documented.                 Feliz Lincoln

## 2023-09-30 ENCOUNTER — Inpatient Hospital Stay (HOSPITAL_COMMUNITY)

## 2023-09-30 LAB — BASIC METABOLIC PANEL WITH GFR
Anion gap: 11 (ref 5–15)
BUN: 39 mg/dL — ABNORMAL HIGH (ref 8–23)
CO2: 20 mmol/L — ABNORMAL LOW (ref 22–32)
Calcium: 8.6 mg/dL — ABNORMAL LOW (ref 8.9–10.3)
Chloride: 103 mmol/L (ref 98–111)
Creatinine, Ser: 2.1 mg/dL — ABNORMAL HIGH (ref 0.44–1.00)
GFR, Estimated: 25 mL/min — ABNORMAL LOW (ref >60)
Glucose, Bld: 141 mg/dL — ABNORMAL HIGH (ref 70–99)
Potassium: 3.6 mmol/L (ref 3.5–5.1)
Sodium: 134 mmol/L — ABNORMAL LOW (ref 135–145)

## 2023-09-30 LAB — GLUCOSE, CAPILLARY
Glucose-Capillary: 130 mg/dL — ABNORMAL HIGH (ref 70–99)
Glucose-Capillary: 237 mg/dL — ABNORMAL HIGH (ref 70–99)
Glucose-Capillary: 125 mg/dL — ABNORMAL HIGH (ref 70–99)

## 2023-09-30 LAB — CBC
HCT: 30.6 % — ABNORMAL LOW (ref 36.0–46.0)
Hemoglobin: 10.1 g/dL — ABNORMAL LOW (ref 12.0–15.0)
MCH: 28.9 pg (ref 26.0–34.0)
MCHC: 33 g/dL (ref 30.0–36.0)
MCV: 87.7 fL (ref 80.0–100.0)
Platelets: 80 K/uL — ABNORMAL LOW (ref 150–400)
RBC: 3.49 MIL/uL — ABNORMAL LOW (ref 3.87–5.11)
RDW: 15 % (ref 11.5–15.5)
WBC: 11.3 K/uL — ABNORMAL HIGH (ref 4.0–10.5)
nRBC: 0 % (ref 0.0–0.2)

## 2023-09-30 LAB — MAGNESIUM: Magnesium: 1.7 mg/dL (ref 1.7–2.4)

## 2023-09-30 MED ORDER — SODIUM CHLORIDE 0.9 % IV SOLN
2.0000 g | INTRAVENOUS | Status: DC
  Administered 2023-09-30 – 2023-10-01 (×2): 2 g via INTRAVENOUS
  Filled 2023-09-30 (×2): qty 20

## 2023-09-30 MED ORDER — TRAMADOL HCL 50 MG PO TABS: 50.0000 mg | ORAL_TABLET | Freq: Four times a day (QID) | ORAL | Status: DC | PRN

## 2023-09-30 MED ORDER — SULFAMETHOXAZOLE-TRIMETHOPRIM 800-160 MG PO TABS: 1.0000 | ORAL_TABLET | Freq: Two times a day (BID) | ORAL | Status: DC

## 2023-09-30 MED ORDER — POTASSIUM CHLORIDE CRYS ER 20 MEQ PO TBCR
20.0000 meq | EXTENDED_RELEASE_TABLET | ORAL | Status: DC
  Administered 2023-09-30: 20 meq via ORAL
  Filled 2023-09-30: qty 1

## 2023-09-30 NOTE — Progress Notes (Signed)
 2 Days Post-Op Procedure(s) (LRB): THORACOTOMY, MAJOR (Left) INSERTION, EPICARDIAL ELECTRODE LEAD (Left) Subjective: No complaints. Pain is much better. Eating breakfast. Has been walking.   Still on 1L [place].  Objective: Vital signs in last 24 hours: Temp:  [98 F (36.7 C)-98.4 F (36.9 C)] 98.3 F (36.8 C) (08/20 0319) Pulse Rate:  [61-91] 82 (08/20 0621) Cardiac Rhythm: A-V Sequential paced (08/19 2030) Resp:  [10-20] 19 (08/20 0621) BP: (84-108)/(52-69) 101/65 (08/20 0621) SpO2:  [85 %-97 %] 93 % (08/20 0637)  Hemodynamic parameters for last 24 hours:    Intake/Output from previous day: 08/19 0701 - 08/20 0700 In: 373.8 [P.O.:360; I.V.:13.8] Out: 1160 [Urine:1160] Intake/Output this shift: No intake/output data recorded.  General appearance: alert and cooperative Heart: regular rate and rhythm Lungs: clear to auscultation bilaterally Extremities: edema mild Wound: incisions healing well.  Lab Results: Recent Labs    09/29/23 0236 09/30/23 0237  WBC 17.6* 11.3*  HGB 11.2* 10.1*  HCT 34.5* 30.6*  PLT 114* 80*   BMET:  Recent Labs    09/29/23 0236 09/30/23 0237  NA 130* 134*  K 4.4 3.6  CL 101 103  CO2 19* 20*  GLUCOSE 432* 141*  BUN 27* 39*  CREATININE 1.58* 2.10*  CALCIUM  7.9* 8.6*    PT/INR: No results for input(s): LABPROT, INR in the last 72 hours. ABG    Component Value Date/Time   PHART 7.480 (H) 03/09/2008 1625   HCO3 22.9 03/09/2008 1625   TCO2 23.8 03/09/2008 1625   ACIDBASEDEF 0.3 03/09/2008 1625   O2SAT 95.5 03/09/2008 1625   CBG (last 3)  Recent Labs    09/29/23 1936 09/29/23 2313 09/30/23 0320  GLUCAP 153* 117* 130*   CXR: clear.  UA: many bacteria, 6-10 WBC, neg nitrite  Assessment/Plan: S/P Procedure(s) (LRB): THORACOTOMY, MAJOR (Left) INSERTION, EPICARDIAL ELECTRODE LEAD (Left)  POD 2  Hemodynamics stable.  Postop AKI possibly related to surgery with Entresto , spiro, diuretics. 1160 cc urine yesterday. -786  cc. Holding these meds.  Cath UA abnormal: will start Bactrim  DS bid. She received a few days of this preop for abnormal UA.   Transfer to 4E and continue IS, ambulation.   LOS: 2 days    [name] [name] 09/30/2023

## 2023-09-30 NOTE — Plan of Care (Signed)

## 2023-09-30 NOTE — TOC Initial Note (Signed)
 Transition of Care [facility]) - Initial/Assessment Note    Patient Details  Name: [name] [name] MRN: [id number] Date of Birth: Nov 23, 1950  [facility]) CM/SW Contact:    Marval Gell, RN Phone Number: 09/30/2023, 9:17 AM  Clinical Narrative:                  Per chart review Patient from home with spouse.  Admitted for Left thoracotomy for LV epicardial lead placement.  Independent prior to admission, no history of HH seen on [facility].  Patient has insurance coverage and primary care. ICM will continue to follow for disposition needs    Barriers to Discharge: Continued Medical Work up   Patient Goals and CMS Choice            Expected Discharge Plan and Services                                              Prior Living Arrangements/Services                       Activities of Daily Living      Permission Sought/Granted                  Emotional Assessment              Admission diagnosis:  Chronic systolic heart failure (HCC) [I50.22] S/P thoracotomy [Z98.890] Patient Active Problem List   Diagnosis Date Noted   S/P thoracotomy 09/28/2023   AF (amaurosis fugax) 06/10/2023   History of colonic polyps 09/09/2022   NSVT (nonsustained ventricular tachycardia) (HCC) 07/09/2021   S/P left mastectomy 04/05/2021   Pneumonia due to COVID-19 virus 02/23/2019   Acute respiratory failure (HCC) 02/19/2019   History of cardioembolic cerebrovascular accident (CVA) on anticoagulation. 02/19/2019   COVID-19 virus infection 02/19/2019   History of reconstruction of right breast 05/31/2015   Long term (current) use of anticoagulants 05/06/2010   SYSTOLIC HEART FAILURE, CHRONIC 06/12/2009   ICD (implantable cardioverter-defibrillator), dual, in situ 03/06/2009   Congestive dilated cardiomyopathy (HCC) 05/18/2008   HYPOTENSION, CHRONIC 05/18/2008   NECK PAIN 04/11/2008   FATIGUE / MALAISE 03/16/2008   PCP:  [name], MD Pharmacy:   [facility] - [place], [place] - [address] [place] [address] Phone: [phone] Fax: [phone]  [facility] [address] [place] - [address] [place] [address] Phone: [phone] Fax: [phone]     Social Drivers of Health (SDOH) Social History: SDOH Screenings   Food Insecurity: No Food Insecurity (03/04/2021)   Received from [facility]  Housing: Unknown (06/15/2023)   Received from [facility]  Transportation Needs: No Transportation Needs (03/04/2021)   Received from [facility]  Financial Resource Strain: Low Risk  (03/04/2021)   Received from [facility]  Physical Activity: Insufficiently Active (03/04/2021)   Received from [facility]  Social Connections: Moderately Integrated (03/04/2021)   Received from [facility]  Stress: No Stress Concern Present (03/04/2021)   Received from [facility]  Tobacco Use: Low Risk  (09/28/2023)  Health Literacy: Low Risk  (03/04/2021)   Received from [facility]   SDOH Interventions:     Readmission Risk Interventions     No  data to display

## 2023-10-01 ENCOUNTER — Other Ambulatory Visit (HOSPITAL_COMMUNITY): Payer: Self-pay

## 2023-10-01 ENCOUNTER — Telehealth: Payer: Self-pay

## 2023-10-01 LAB — MAGNESIUM: Magnesium: 1.9 mg/dL (ref 1.7–2.4)

## 2023-10-01 LAB — BASIC METABOLIC PANEL WITH GFR
Anion gap: 13 (ref 5–15)
BUN: 32 mg/dL — ABNORMAL HIGH (ref 8–23)
CO2: 21 mmol/L — ABNORMAL LOW (ref 22–32)
Calcium: 8.9 mg/dL (ref 8.9–10.3)
Chloride: 105 mmol/L (ref 98–111)
Creatinine, Ser: 1.33 mg/dL — ABNORMAL HIGH (ref 0.44–1.00)
GFR, Estimated: 43 mL/min — ABNORMAL LOW (ref >60)
Glucose, Bld: 132 mg/dL — ABNORMAL HIGH (ref 70–99)
Potassium: 3.4 mmol/L — ABNORMAL LOW (ref 3.5–5.1)
Sodium: 139 mmol/L (ref 135–145)

## 2023-10-01 LAB — DIGOXIN LEVEL: Digoxin Level: <0.4 ng/mL — ABNORMAL LOW (ref 0.8–2.0)

## 2023-10-01 MED ORDER — SULFAMETHOXAZOLE-TRIMETHOPRIM 800-160 MG PO TABS
1.0000 | ORAL_TABLET | Freq: Two times a day (BID) | ORAL | 0 refills | Status: AC
  Filled 2023-10-01: qty 6, 3d supply, fill #0

## 2023-10-01 MED ORDER — POTASSIUM CHLORIDE CRYS ER 20 MEQ PO TBCR
40.0000 meq | EXTENDED_RELEASE_TABLET | Freq: Once | ORAL | Status: AC
  Administered 2023-10-01: 40 meq via ORAL
  Filled 2023-10-01: qty 2

## 2023-10-01 MED ORDER — ACETAMINOPHEN 500 MG PO TABS: 500.0000 mg | ORAL_TABLET | Freq: Four times a day (QID) | ORAL | Status: DC | PRN

## 2023-10-01 MED ORDER — TRAMADOL HCL 50 MG PO TABS
50.0000 mg | ORAL_TABLET | Freq: Four times a day (QID) | ORAL | 0 refills | Status: DC | PRN
  Filled 2023-10-01: qty 28, 7d supply, fill #0

## 2023-10-01 NOTE — Plan of Care (Signed)
  Problem: Health Behavior/Discharge Planning: Goal: Ability to manage health-related needs will improve Outcome: Progressing   Problem: Clinical Measurements: Goal: Will remain free from infection Outcome: Progressing Goal: Diagnostic test results will improve Outcome: Progressing Goal: Respiratory complications will improve Outcome: Progressing   

## 2023-10-01 NOTE — Care Management Important Message (Signed)
 Important Message  Patient Details  Name: [name] [name] MRN: [id number] Date of Birth: 1950-05-08   Important Message Given:  Yes - Medicare IM     Vonzell Arrie Sharps 10/01/2023, 10:07 AM

## 2023-10-01 NOTE — TOC Transition Note (Signed)
 Transition of Care (TOC) - Discharge Note Rayfield Gobble RN, BSN Inpatient Care Management Unit 4E- RN Case Manager See Treatment Team for direct phone #   Patient Details  Name: [name] [name] MRN: [id number] Date of Birth: Jan 31, 1951  [facility]) CM/SW Contact:  Gobble Rayfield Hurst, RN Phone Number: 10/01/2023, 9:55 AM   Clinical Narrative:    Pt stable for transition home today, no HH or DME needs noted. Family to transport home.    Final next level of care: Home/Self Care Barriers to Discharge: Barriers Resolved   Patient Goals and CMS Choice Patient states their goals for this hospitalization and ongoing recovery are:: return home   Choice offered to / list presented to : NA      Discharge Placement               Home        Discharge Plan and Services Additional resources added to the After Visit Summary for   In-house Referral: NA Discharge Planning Services: NA Post Acute Care Choice: NA          DME Arranged: N/A DME Agency: NA       HH Arranged: NA HH Agency: NA        Social Drivers of Health (SDOH) Interventions SDOH Screenings   Food Insecurity: Patient Declined (10/01/2023)  Housing: Unknown (10/01/2023)  Transportation Needs: Patient Declined (10/01/2023)  Utilities: Patient Declined (10/01/2023)  Financial Resource Strain: Low Risk  (03/04/2021)   Received from [facility]  Physical Activity: Insufficiently Active (03/04/2021)   Received from [facility]  Social Connections: Patient Declined (10/01/2023)  Stress: No Stress Concern Present (03/04/2021)   Received from [facility]  Tobacco Use: Low Risk  (09/28/2023)  Health Literacy: Low Risk  (03/04/2021)   Received from [facility]     Readmission Risk Interventions    10/01/2023    9:55 AM  Readmission Risk Prevention Plan  Post Dischage Appt Complete  Medication Screening Complete  Transportation Screening Complete

## 2023-10-01 NOTE — Progress Notes (Signed)
 DC order noted per MD. DC RN at bedside. Patient/family agreeable to discharge. Patient requires dose of IV abx prior to discharge, administered per Primary RN. PIVs removed. AVS printed/reviewed with patient/family. Postop instructions provided to the patient. Skin intact. All belongings accounted for. CP/Edu resolved. TOC med delivered to bedside. Front desk informed the family will call when ready for volunteer transport.

## 2023-10-01 NOTE — Plan of Care (Signed)
  Problem: Health Behavior/Discharge Planning: Goal: Ability to manage health-related needs will improve Outcome: Progressing   Problem: Clinical Measurements: Goal: Will remain free from infection 10/01/2023 0441 by Neville Arland SAUNDERS, RN Outcome: Progressing 10/01/2023 0432 by Neville Arland SAUNDERS, RN Outcome: Progressing Goal: Diagnostic test results will improve Outcome: Progressing Goal: Respiratory complications will improve Outcome: Progressing

## 2023-10-01 NOTE — Progress Notes (Addendum)
      [address]       [place] [address]             [phone]      3 Days Post-Op Procedure(s) (LRB): THORACOTOMY, MAJOR (Left) INSERTION, EPICARDIAL ELECTRODE LEAD (Left)  Subjective:  Patient without complaints.  She states her pain is well controlled.  Denies N/V.  She is ambulating independently and has moved her bowels.  Objective: Vital signs in last 24 hours: Temp:  [97.7 F (36.5 C)-98.8 F (37.1 C)] 98.8 F (37.1 C) (08/21 0413) Pulse Rate:  [74-101] 89 (08/20 2347) Cardiac Rhythm: A-V Sequential paced (08/20 2100) Resp:  [14-22] 20 (08/21 0413) BP: (83-118)/(47-73) 106/58 (08/21 0413) SpO2:  [90 %-97 %] 97 % (08/21 0413) Weight:  [72.3 kg] 72.3 kg (08/21 0413)  Intake/Output from previous day: 08/20 0701 - 08/21 0700 In: 460.3 [P.O.:360; IV Piggyback:100.3] Out: -   General appearance: alert, cooperative, and no distress Heart: regular rate and rhythm Lungs: clear to auscultation bilaterally Abdomen: soft, non-tender; bowel sounds normal; no masses,  no organomegaly Extremities: extremities normal, atraumatic, no cyanosis or edema Wound: clean and dry  Lab Results: Recent Labs    09/29/23 0236 09/30/23 0237  WBC 17.6* 11.3*  HGB 11.2* 10.1*  HCT 34.5* 30.6*  PLT 114* 80*   BMET:  Recent Labs    09/30/23 0237 10/01/23 0340  NA 134* 139  K 3.6 3.4*  CL 103 105  CO2 20* 21*  GLUCOSE 141* 132*  BUN 39* 32*  CREATININE 2.10* 1.33*  CALCIUM  8.6* 8.9    PT/INR: No results for input(s): LABPROT, INR in the last 72 hours. ABG    Component Value Date/Time   PHART 7.480 (H) 03/09/2008 1625   HCO3 22.9 03/09/2008 1625   TCO2 23.8 03/09/2008 1625   ACIDBASEDEF 0.3 03/09/2008 1625   O2SAT 95.5 03/09/2008 1625   CBG (last 3)  Recent Labs    09/30/23 0320 09/30/23 0855 09/30/23 1202  GLUCAP 130* 237* 125*    Assessment/Plan: S/P Procedure(s) (LRB): THORACOTOMY, MAJOR (Left) INSERTION, EPICARDIAL ELECTRODE LEAD  (Left)  CV- Non-ischemic cardiomyopathy- S/P Mini Thoracotomy for placement of LV Lead and ICD Biventricular generator replacement.. she is paced in the 90s this morning, with PVCs at times-on home Digoxin , will discuss resumption of Entresto  and Lopressor  with Dr. Lucas Carder- off oxygen, no acute issues.. continue IS at discharge Renal- AKI due to diuretics/Entresto  use- resolving.. creatinine down to 1.33.. will likely resume home Aldactone  today Hypokalemia- mild at 3.4 due to diuretic use.. will give 40 meq today GU- UTI- receiving IV Rocephin  here, will transition to Bactrim  DS for discharge Dispo- overall patient doing well, will discuss resumption of home medications, likely for discharge home today.   LOS: 3 days   Rocky Shad, PA-C 10/01/2023   Chart reviewed, patient examined, agree with above.  She looks good. BP is low 100's so will resume lasix  and metoprolol  but hold off on spiro and Entresto  for now and plan to resume when she comes back to office if BP ok. Creat down to 1.3. Plan home today.

## 2023-10-01 NOTE — Telephone Encounter (Signed)
-----   Message from Rocky Shad sent at 10/01/2023  7:44 AM EDT ----- Hi,  This patient will be in the office on 8/28 for suture removal.   She will need a blood pressure check.  If her blood pressure is back to an elevated state she needs to resume her Entresto  and Aldactone .  Thanks  [employer]

## 2023-10-02 ENCOUNTER — Telehealth: Payer: Self-pay

## 2023-10-02 NOTE — Patient Instructions (Signed)
 Visit Information  Thank you for taking time to visit with me today. Please don't hesitate to contact me if I can be of assistance to you before our next scheduled telephone appointment.  Our next appointment is by telephone on 10/09/23 at 330 pm  Following is a copy of your care plan:   Goals Addressed             This Visit's Progress    VBCI Transitions of Care (TOC) Care Plan       Problems:  Recent Hospitalization for treatment of Left Thoracotomy with insertion of LV epicardial pacing leads and revision to biventricular pacing system with generator changeout.   Goal:  Over the next 30 days, the patient will not experience hospital readmission  Interventions:  Transitions of Care: Patient states she is feeling pretty good today.  Annual Eye Exam Patient has an upcoming appointment reviewed with patient on 10/15/23.  Doctor Visits  - discussed the importance of doctor visits Post discharge activity limitations prescribed by provider reviewed Post-op wound/incision care reviewed with patient/caregiver Reviewed Signs and symptoms of infection Reviewed medication list including medications she is to hold till see PCP.  Reviewed upcoming follow up appointments,  Discussed family support of which she states she as great support.  Completed initial outreach call data and assessments.  Reviewed SDOH: No needs at this time  Discussed restarting home monitoring of BP, blood sugars and daily weights.  Reviewed and discussed discharge instructions and when to call provider.   Patient Self Care Activities:  Attend all scheduled provider appointments Attend church or other social activities Call pharmacy for medication refills 3-7 days in advance of running out of medications Call provider office for new concerns or questions  Notify RN Care Manager of TOC call rescheduling needs Participate in Transition of Care Program/Attend TOC scheduled calls Perform all self care activities  independently  Take medications as prescribed   Per discussion with patient she will restart taking blood pressure, daily weights, and continue taking blood sugars tomorrow.   Plan:  An initial telephone outreach has been scheduled for: 10/09/23 at 330 pm        Patient verbalizes understanding of instructions and care plan provided today and agrees to view in MyChart. Active MyChart status and patient understanding of how to access instructions and care plan via MyChart confirmed with patient.     Our next appointment is by telephone on 10/09/23 at 330 pm TOC week #2.  Our next appointment is by telephone on 10/09/23 at 330 pm  Please call the care guide team at [phone] if you need to cancel or reschedule your appointment.   Please call the Suicide and Crisis Lifeline: 988 call 1-800-273-TALK (toll free, 24 hour hotline) call 911 if you are experiencing a Mental Health or Behavioral Health Crisis or need someone to talk to.   Bing Edison MSN, RN RN Case Sales executive Health  VBCI-Population Health Office Hours M-F [phone] Direct Dial: [phone] Main Phone [phone]  Fax: [phone] [email or web address]

## 2023-10-02 NOTE — Transitions of Care (Post Inpatient/ED Visit) (Signed)
 Today's TOC FU Call Status: Today's TOC FU Call Status:: Successful TOC FU Call Completed TOC FU Call Complete Date: 10/01/23 Patient's Name and Date of Birth confirmed.  Transition Care Management Follow-up Telephone Call Date of Discharge: 10/01/23 Discharge Facility: [name] [name] [facility]) Type of Discharge: Inpatient Admission Primary Inpatient Discharge Diagnosis:: S/P thoracotomy How have you been since you were released from the hospital?: Better Any questions or concerns?: No  Items Reviewed: Did you receive and understand the discharge instructions provided?: Yes Medications obtained,verified, and reconciled?: Yes (Medications Reviewed) Any new allergies since your discharge?: No Dietary orders reviewed?: Yes Type of Diet Ordered:: Heart Healthy Do you have support at home?: Yes Name of Support/Comfort Primary Source: [place], [place] (Spouse)  [phone] (Mobile)  Medications Reviewed Today: Medications Reviewed Today     Reviewed by Carolee Heron NOVAK, RN (Case Manager) on 10/02/23 at 1523  Med List Status: <None>   Medication Order Taking? Sig Documenting Provider Last Dose Status Informant  ACCU-CHEK GUIDE TEST test strip [id number] Yes daily. [provider]  Active Self  Accu-Chek Softclix Lancets lancets [id number] Yes daily. [provider]  Active Self  acetaminophen  (TYLENOL ) 500 MG tablet [id number] Yes Take 1-2 tablets (500-1,000 mg total) by mouth every 6 (six) hours as needed. Barrett, Erin R, PA-C  Active   anastrozole  (ARIMIDEX ) 1 MG tablet [id number] Yes Take 1 mg by mouth in the morning. [provider]  Active Self  atorvastatin  (LIPITOR) 20 MG tablet [id number] Yes Take 1 tablet (20 mg total) by mouth daily.  Patient taking differently: Take 20 mg by mouth every evening.   Pearlean Manus, MD  Active Self  cholecalciferol  (VITAMIN D3) 25 MCG (1000 UNIT) tablet [id number] Yes Take 1,000 Units by mouth daily. [provider]  Active  Self  Collagen-Vitamin C-Biotin (COLLAGEN PO) [id number] Yes Take 1 Scoop by mouth daily. [provider]  Active Self  digoxin  (LANOXIN ) 0.125 MG tablet [id number] Yes Take 0.5 tablets (0.0625 mg total) by mouth daily. Fernande Elspeth BROCKS, MD  Active Self  empagliflozin  (JARDIANCE ) 10 MG TABS tablet [id number] Yes Take 1 tablet (10 mg total) by mouth daily before breakfast. Lavona Agent, MD  Active Self  furosemide  (LASIX ) 40 MG tablet [id number] Yes Take 40-60 mg by mouth See admin instructions. Take 40 mg by mouth every other day, alternating with 60 mg on alternate days. [provider]  Active Self  metoprolol  succinate (TOPROL  XL) 25 MG 24 hr tablet [id number] Yes Take 1 tablet (25 mg total) by mouth in the morning AND 2 tablets (50 mg total) at bedtime. Lavona Agent, MD  Active Self  Multiple Vitamin (MULTIVITAMIN WITH MINERALS) TABS tablet [id number] Yes Take 1 tablet by mouth in the morning. [provider]  Active Self  multivitamin-lutein (OCUVITE-LUTEIN) CAPS capsule [id number] Yes Take 1 capsule by mouth daily. [provider]  Active Self  rivaroxaban  (XARELTO ) 20 MG TABS tablet [id number] Yes Take 1 tablet (20 mg total) by mouth daily with supper. Lavona Agent, MD  Active Self  sulfamethoxazole -trimethoprim  (BACTRIM  DS) 800-160 MG tablet [id number] Yes Take 1 tablet by mouth 2 (two) times daily for 3 days. Barrett, Rocky SAUNDERS, PA-C  Active   traMADol  (ULTRAM ) 50 MG tablet [id number] Yes Take 1 tablet (50 mg total) by mouth every 6 (six) hours as needed (mild pain). Barrett, Rocky SAUNDERS, PA-C  Active             Home Care and Equipment/Supplies: Were Home Health Services Ordered?: No Any new  equipment or medical supplies ordered?: No  Functional Questionnaire: Do you need assistance with bathing/showering or dressing?: No Do you need assistance with meal preparation?: No Do you need assistance with eating?: No Do you have difficulty maintaining  continence: No Do you need assistance with getting out of bed/getting out of a chair/moving?: No Do you have difficulty managing or taking your medications?: No  Follow up appointments reviewed: PCP Follow-up appointment confirmed?: Yes Date of PCP follow-up appointment?: 10/10/23 Follow-up Provider: Jerel Kipper Specialist [facility] Follow-up appointment confirmed?: Yes Date of Specialist follow-up appointment?: 10/08/23 Follow-Up Specialty Provider:: Cardiothoraic Do you need transportation to your follow-up appointment?: No Do you understand care options if your condition(s) worsen?: Yes-patient verbalized understanding  SDOH Interventions Today    Flowsheet Row Most Recent Value  SDOH Interventions   Food Insecurity Interventions Intervention Not Indicated  Housing Interventions Intervention Not Indicated  Transportation Interventions Intervention Not Indicated, Patient Resources (Friends/Family)  Utilities Interventions Intervention Not Indicated  Social Connections Interventions Intervention Not Indicated, Patient Declined  Health Literacy Interventions Intervention Not Indicated    Goals Addressed             This Visit's Progress    VBCI Transitions of Care (TOC) Care Plan       Problems:  Recent Hospitalization for treatment of Left Thoracotomy with insertion of LV epicardial pacing leads and revision to biventricular pacing system with generator changeout.   Goal:  Over the next 30 days, the patient will not experience hospital readmission  Interventions:  Transitions of Care: Patient states she is feeling pretty good today.  Annual Eye Exam Patient has an upcoming appointment reviewed with patient on 10/15/23.  Doctor Visits  - discussed the importance of doctor visits Post discharge activity limitations prescribed by provider reviewed Post-op wound/incision care reviewed with patient/caregiver Reviewed Signs and symptoms of infection Reviewed medication list  including medications she is to hold till see PCP.  Reviewed upcoming follow up appointments,  Discussed family support of which she states she as great support.  Completed initial outreach call data and assessments.  Reviewed SDOH: No needs at this time  Discussed restarting home monitoring of BP, blood sugars and daily weights.  Reviewed and discussed discharge instructions and when to call provider.   Patient Self Care Activities:  Attend all scheduled provider appointments Attend church or other social activities Call pharmacy for medication refills 3-7 days in advance of running out of medications Call provider office for new concerns or questions  Notify RN Care Manager of TOC call rescheduling needs Participate in Transition of Care Program/Attend TOC scheduled calls Perform all self care activities independently  Take medications as prescribed   Per discussion with patient she will restart taking blood pressure, daily weights, and continue taking blood sugars tomorrow.   Plan:  An initial telephone outreach has been scheduled for: 10/09/23 at 330 pm      Patient understands how to contact insurance for any needed benefits.  This note will be routed via EPIC fax to PCP.   Bing Edison MSN, RN RN Case Sales executive Health  VBCI-Population Health Office Hours M-F [phone] Direct Dial: [phone] Main Phone [phone]  Fax: [phone] [email or web address]

## 2023-10-05 ENCOUNTER — Telehealth: Payer: Self-pay | Admitting: *Deleted

## 2023-10-05 NOTE — Telephone Encounter (Signed)
 Called patient after phone call placed to answering service over the weekend. Patient state provider returned called. No concerns at this time.

## 2023-10-08 ENCOUNTER — Other Ambulatory Visit: Payer: Self-pay | Admitting: Internal Medicine

## 2023-10-08 ENCOUNTER — Ambulatory Visit: Attending: Surgery

## 2023-10-08 DIAGNOSIS — Z4802 Encounter for removal of sutures: Secondary | ICD-10-CM

## 2023-10-09 ENCOUNTER — Other Ambulatory Visit: Payer: Self-pay

## 2023-10-09 ENCOUNTER — Telehealth: Payer: Self-pay

## 2023-10-09 DIAGNOSIS — E1122 Type 2 diabetes mellitus with diabetic chronic kidney disease: Secondary | ICD-10-CM | POA: Diagnosis not present

## 2023-10-09 DIAGNOSIS — E782 Mixed hyperlipidemia: Secondary | ICD-10-CM | POA: Diagnosis not present

## 2023-10-09 DIAGNOSIS — E559 Vitamin D deficiency, unspecified: Secondary | ICD-10-CM | POA: Diagnosis not present

## 2023-10-09 DIAGNOSIS — I5022 Chronic systolic (congestive) heart failure: Secondary | ICD-10-CM | POA: Diagnosis not present

## 2023-10-09 NOTE — Transitions of Care (Post Inpatient/ED Visit) (Signed)
 Transition of Care week 2  Visit Note  10/09/2023  Name: [name] [name] MRN: [id number]          DOB: 1951/01/28  Situation: Patient enrolled in [facility] 30-day program. Visit completed with patient by telephone.   Background:   Initial Transition Care Management Follow-up Telephone Call    Past Medical History:  Diagnosis Date   AICD (automatic cardioverter/defibrillator) present    Arthritis    thumbs   Breast cancer (HCC)    Right 1994 and Left 2023   Cerebrovascular disease    s/p right MCA cardioemoblic infarct 8/71/89   CHF (congestive heart failure) (HCC)    Chronic kidney disease    History of kidney stones    Nonischemic cardiomyopathy (HCC)    a. +LMNA mutation b. s/p STJ dual chamber ICD   Pneumonia    Pre-diabetes    Presence of permanent cardiac pacemaker    Stroke (HCC) 02/2008   no deficits   Stroke (HCC)    05/27/23 right eye and Left eye 06/2023 strokes no deficits    Assessment: Patient Reported Symptoms: Cognitive Cognitive Status: Alert and oriented to person, place, and time, Normal speech and language skills, Insightful and able to interpret abstract concepts      Neurological Neurological Review of Symptoms: No symptoms reported    HEENT HEENT Symptoms Reported: No symptoms reported      Cardiovascular Cardiovascular Symptoms Reported: No symptoms reported Does patient have uncontrolled Hypertension?: No Cardiovascular Management Strategies: Activity, Adequate rest, Medical device, Medication therapy, Routine screening Weight: 155 lb (70.3 kg) Cardiovascular Self-Management Outcome: 4 (good)  Respiratory Respiratory Symptoms Reported: No symptoms reported Additional Respiratory Details: occasional shortness of breath Respiratory Management Strategies: Medication therapy, Coping strategies, Activity, Adequate rest, Routine screening (Patient states she needs sleep apnea study.) Respiratory Self-Management Outcome: 4 (good)  Endocrine  Endocrine Symptoms Reported: No symptoms reported Is patient diabetic?: Yes Is patient checking blood sugars at home?: Yes List most recent blood sugar readings, include date and time of day: 138 Endocrine Comment: Has been running 99k 100, 103 before stress of chest drainage from stitch removal  Gastrointestinal Gastrointestinal Symptoms Reported: Change in appetite Additional Gastrointestinal Details: A bit of loss of appetite, not back normal yet.      Genitourinary Genitourinary Symptoms Reported: No symptoms reported    Integumentary Additional Integumentary Details: removed sticth on 10/08/23, patient is having constant orangey red drainage that she reported to Cardiothoraci RN this am and was told it should decrease but call if changes to a white color. Has PCP appointment in the am. Patient states having to change it every hours, not additional shortness of breath associated with this drainage. Skin Management Strategies: Dressing changes, Routine screening, Coping strategies, Adequate rest Skin Self-Management Outcome: 3 (uncertain)  Musculoskeletal Musculoskelatal Symptoms Reviewed: No symptoms reported Musculoskeletal Management Strategies: Adequate rest, Activity Falls in the past year?:  (no falls reported this week.)    Psychosocial Psychosocial Symptoms Reported: No symptoms reported         Vitals:   10/09/23 1618  BP: 107/60  SpO2: 92%  Heart Rate 61 per SpO2 monitor taken while on the call.  Blood sugar 138.   Medications Reviewed Today     Reviewed by Carolee Heron NOVAK, RN (Case Manager) on 10/09/23 at 1611  Med List Status: <None>   Medication Order Taking? Sig Documenting Provider Last Dose Status Informant  ACCU-CHEK GUIDE TEST test strip [id number] Yes daily. [provider]  Active Self  Accu-Chek Softclix Lancets lancets [id number] Yes daily. [provider]  Active Self  acetaminophen  (TYLENOL ) 500 MG tablet [id number] Yes Take 1-2 tablets  (500-1,000 mg total) by mouth every 6 (six) hours as needed. Barrett, Erin R, PA-C  Active   anastrozole  (ARIMIDEX ) 1 MG tablet [id number] Yes Take 1 mg by mouth in the morning. [provider]  Active Self  atorvastatin  (LIPITOR) 20 MG tablet [id number] Yes Take 1 tablet (20 mg total) by mouth daily.  Patient taking differently: Take 20 mg by mouth every evening.   Pearlean Manus, MD  Active Self  cholecalciferol  (VITAMIN D3) 25 MCG (1000 UNIT) tablet [id number] Yes Take 1,000 Units by mouth daily. [provider]  Active Self  Collagen-Vitamin C-Biotin (COLLAGEN PO) [id number] Yes Take 1 Scoop by mouth daily. [provider]  Active Self  digoxin  (LANOXIN ) 0.125 MG tablet [id number] Yes TAKE 1/2 TABLET BY MOUTH DAILY Fernande Elspeth BROCKS, MD  Active   empagliflozin  (JARDIANCE ) 10 MG TABS tablet [id number] Yes Take 1 tablet (10 mg total) by mouth daily before breakfast. Lavona Agent, MD  Active Self  furosemide  (LASIX ) 40 MG tablet [id number] Yes Take 40-60 mg by mouth See admin instructions. Take 40 mg by mouth every other day, alternating with 60 mg on alternate days. [provider]  Active Self  metoprolol  succinate (TOPROL  XL) 25 MG 24 hr tablet [id number] Yes Take 1 tablet (25 mg total) by mouth in the morning AND 2 tablets (50 mg total) at bedtime. Lavona Agent, MD  Active Self  Multiple Vitamin (MULTIVITAMIN WITH MINERALS) TABS tablet [id number] Yes Take 1 tablet by mouth in the morning. [provider]  Active Self  multivitamin-lutein (OCUVITE-LUTEIN) CAPS capsule [id number] Yes Take 1 capsule by mouth daily. [provider]  Active Self  rivaroxaban  (XARELTO ) 20 MG TABS tablet [id number] Yes Take 1 tablet (20 mg total) by mouth daily with supper. Lavona Agent, MD  Active Self  traMADol  (ULTRAM ) 50 MG tablet [id number] Yes Take 1 tablet (50 mg total) by mouth every 6 (six) hours as needed (mild pain). Barrett, Rocky SAUNDERS, PA-C  Active              Goals Addressed             This Visit's Progress    VBCI Transitions of Care (TOC) Care Plan       Reviewed 10/09/23 with patient on scheduled call.   Problems:  Recent Hospitalization for treatment of Left Thoracotomy with insertion of LV epicardial pacing leads and revision to biventricular pacing system with generator changeout. Drainage after removal of stitch on 10/08/23 in office.  Has contacted office with return call instructions, and has PCP HFU in the am at 1030.  Changing dressing every hour at time of this call.  Goal:  Over the next 30 days, the patient will not experience hospital readmission  Interventions:  Transitions of Care: Patient states she is feeling pretty good today except for stress related to drainage from stitch removal.  Annual Eye Exam Patient has an upcoming appointment reviewed with patient on 10/15/23.  Doctor Visits  - discussed the importance of doctor visits Post discharge activity limitations prescribed by provider reviewed Post-op wound/incision care reviewed with patient/caregiver Reviewed Signs and symptoms of infection Reviewed medication list including medications she is to hold till see PCP on 10/10/23 at 1030 am.  Reviewed upcoming follow up appointments,  Discussed family support of which she states she as great support.  Reviewed SDOH:  No needs at this time  Discussed continuing home monitoring of BP, blood sugars and daily weights, and also SpO2 home monitoring device.  BP: 107/60, HR 61, SpO2 92%, blood sugar today was 138 though patient stated it had been 99, 110, and 103 the days before all this drainage stress started.  Reviewed and discussed discharge instructions and when to call provider.  Patient had post procedure during hospitalization chest drain stitch removed in the office yesterday and has been having to change dressing every hour or more overnight.  Patient contacted cardiothoracic RN on 10/08/27 and had a return call this  am and was told to just monitor that drainage should decrease but to call if color changes from orangey red to white or whitish milky.  At the time of this call patient is still changing dressing every hour but it is decreasing somewhat compared to overnight where it saturated her bedclothes.  No additional shortness of breath or other issues associated with the continued drainage.  Patient has a PCP HFU in the am (10/10/23) at 1030 and will have it assessed, but encouraged to contact provider if gets worse or do to UC/ED.   Patient Self Care Activities:  Attend all scheduled provider appointments Attend church or other social activities Call pharmacy for medication refills 3-7 days in advance of running out of medications Call provider office for new concerns or questions  Notify RN Care Manager of TOC call rescheduling needs Participate in Transition of Care Program/Attend TOC scheduled calls Perform all self care activities independently  Take medications as prescribed   Per discussion has restarted taking blood pressure, daily weights, blood sugars, also SpO2% monitoring. BP: 107/60, HR 61, SpO2 92%, blood sugar today was 138 though patient stated it had been 99, 110, and 103 the days before all this drainage stress started.  Plan:  Telephonic call scheduled for next week on 10/16/23.         Recommendation:   PCP Follow-up Continue Current Plan of Care  Follow Up Plan:   Telephone follow-up in 1 week   Bing Edison MSN, RN RN Case Manager [facility]  [facility]BC[facility]-Population Health Office Hours M-F [phone] Direct Dial: [phone] Main Phone [phone]  Fax: [phone] [email or web address]

## 2023-10-09 NOTE — Patient Instructions (Signed)
 Visit Information  Thank you for taking time to visit with me today. Please don't hesitate to contact me if I can be of assistance to you before our next scheduled telephone appointment.  Our next appointment is by telephone on 10/16/23 at 1000 am  Following is a copy of your care plan:   Goals Addressed             This Visit's Progress    [facility]I Transitions of Care [facility]) Care Plan       Reviewed 10/09/23 with patient on scheduled call.   Problems:  Recent Hospitalization for treatment of Left Thoracotomy with insertion of LV epicardial pacing leads and revision to biventricular pacing system with generator changeout. Drainage after removal of stitch on 10/08/23 in office.  Has contacted office with return call instructions, and has PCP HFU in the am at 1030.  Changing dressing every hour at time of this call.  Goal:  Over the next 30 days, the patient will not experience hospital readmission  Interventions:  Transitions of Care: Patient states she is feeling pretty good today except for stress related to drainage from stitch removal.  Annual Eye Exam Patient has an upcoming appointment reviewed with patient on 10/15/23.  Doctor Visits  - discussed the importance of doctor visits Post discharge activity limitations prescribed by provider reviewed Post-op wound/incision care reviewed with patient/caregiver Reviewed Signs and symptoms of infection Reviewed medication list including medications she is to hold till see PCP on 10/10/23 at 1030 am.  Reviewed upcoming follow up appointments,  Discussed family support of which she states she as great support.  Reviewed SDOH: No needs at this time  Discussed continuing home monitoring of BP, blood sugars and daily weights, and also SpO2 home monitoring device.  BP: 107/60, HR 61, SpO2 92%, blood sugar today was 138 though patient stated it had been 99, 110, and 103 the days before all this drainage stress started.  Reviewed and discussed  discharge instructions and when to call provider.  Patient had post procedure during hospitalization chest drain stitch removed in the office yesterday and has been having to change dressing every hour or more overnight.  Patient contacted cardiothoracic RN on 10/08/27 and had a return call this am and was told to just monitor that drainage should decrease but to call if color changes from orangey red to white or whitish milky.  At the time of this call patient is still changing dressing every hour but it is decreasing somewhat compared to overnight where it saturated her bedclothes.  No additional shortness of breath or other issues associated with the continued drainage.  Patient has a PCP HFU in the am (10/10/23) at 1030 and will have it assessed, but encouraged to contact provider if gets worse or do to UC/ED.   Patient Self Care Activities:  Attend all scheduled provider appointments Attend church or other social activities Call pharmacy for medication refills 3-7 days in advance of running out of medications Call provider office for new concerns or questions  Notify RN Care Manager of TOC call rescheduling needs Participate in Transition of Care Program/Attend TOC scheduled calls Perform all self care activities independently  Take medications as prescribed   Per discussion has restarted taking blood pressure, daily weights, blood sugars, also SpO2% monitoring. BP: 107/60, HR 61, SpO2 92%, blood sugar today was 138 though patient stated it had been 99, 110, and 103 the days before all this drainage stress started.  Plan:  Telephonic call  scheduled for next week on 10/16/23.        The patient has been provided with contact information for the care management team and has been advised to call with any health-related questions or concerns that are not urgent or emergency needs.  The patient verbalized understanding with current plan of care.  The patient is directed to their insurance card  regarding availability of benefits coverage    Patient verbalizes understanding of instructions and care plan provided today and agrees to view in MyChart. Active MyChart status and patient understanding of how to access instructions and care plan via MyChart confirmed with patient.     Telephone follow up appointment with care management team member scheduled for: 10/16/23 at 1000 am  Please call the care guide team at [phone] if you need to cancel or reschedule your appointment.   Please call the Suicide and Crisis Lifeline: 988 call 1-800-273-TALK (toll free, 24 hour hotline) call 911 if you are experiencing a Mental Health or Behavioral Health Crisis or need someone to talk to.  Bing Edison MSN, RN RN Case Sales executive Health  VBCI-Population Health Office Hours M-F [phone] Direct Dial: [phone] Main Phone [phone]  Fax: [phone] [email or web address]

## 2023-10-13 ENCOUNTER — Ambulatory Visit (INDEPENDENT_AMBULATORY_CARE_PROVIDER_SITE_OTHER)

## 2023-10-13 ENCOUNTER — Other Ambulatory Visit: Payer: Self-pay | Admitting: Surgery

## 2023-10-13 ENCOUNTER — Ambulatory Visit
Admission: RE | Admit: 2023-10-13 | Discharge: 2023-10-13 | Disposition: A | Discharge status: Home / Self Care | Source: Ambulatory Visit | Attending: Cardiology | Admitting: Cardiology

## 2023-10-13 VITALS — BP 114/67 | HR 71 | Resp 18 | Ht 68.0 in | Wt 155.0 lb

## 2023-10-13 DIAGNOSIS — J9811 Atelectasis: Secondary | ICD-10-CM | POA: Diagnosis not present

## 2023-10-13 DIAGNOSIS — Z95810 Presence of automatic (implantable) cardiac defibrillator: Secondary | ICD-10-CM | POA: Diagnosis not present

## 2023-10-13 DIAGNOSIS — Z5189 Encounter for other specified aftercare: Secondary | ICD-10-CM | POA: Insufficient documentation

## 2023-10-13 DIAGNOSIS — I5022 Chronic systolic (congestive) heart failure: Secondary | ICD-10-CM | POA: Insufficient documentation

## 2023-10-13 DIAGNOSIS — Z9889 Other specified postprocedural states: Secondary | ICD-10-CM | POA: Diagnosis not present

## 2023-10-13 DIAGNOSIS — J90 Pleural effusion, not elsewhere classified: Secondary | ICD-10-CM | POA: Diagnosis not present

## 2023-10-13 MED ORDER — POTASSIUM CHLORIDE CRYS ER 20 MEQ PO TBCR: 20.0000 meq | EXTENDED_RELEASE_TABLET | Freq: Once | ORAL | 0 refills | Status: DC

## 2023-10-13 NOTE — Patient Instructions (Signed)
Follow up in one week with chest xray

## 2023-10-13 NOTE — Progress Notes (Signed)
 [address] [place] [address]             [phone]       HPI:  [name] is a 73 year old female who returns to the clinic for a wound check.  She underwent left thoracotomy with insertion of LV epicardial pacing leads and revision to biventricular pacing system with generator changeout  on 09/28/2023 by Dr. Lucas.  She had her chest tube site suture removed on 10/08/2023.  At that time there was no drainage from the site.  For the past 5 days she has had yellow drainage from the chest tube site.  The drainage has been yellow/amber in color.  She has been keeping the area clean.  She has been just using a small piece of gauze over the site for the drainage.  She reports that 2 nights ago was the heaviest amount of drainage which soaked through her shirt.  She denies fever, shortness of breath and lower leg swelling.    Allergies as of 10/13/2023   No Known Allergies      Medication List        Accurate as of October 13, 2023  2:04 PM. If you have any questions, ask your nurse or doctor.          Accu-Chek Guide Test test strip Generic drug: glucose blood daily.   Accu-Chek Softclix Lancets lancets daily.   acetaminophen  500 MG tablet Commonly known as: TYLENOL  Take 1-2 tablets (500-1,000 mg total) by mouth every 6 (six) hours as needed.   anastrozole  1 MG tablet Commonly known as: ARIMIDEX  Take 1 mg by mouth in the morning.   atorvastatin  20 MG tablet Commonly known as: Lipitor Take 1 tablet (20 mg total) by mouth daily. What changed: when to take this   cholecalciferol  25 MCG (1000 UNIT) tablet Commonly known as: VITAMIN D3 Take 1,000 Units by mouth daily.   COLLAGEN PO Take 1 Scoop by mouth daily.   digoxin  0.125 MG tablet Commonly known as: LANOXIN  TAKE 1/2 TABLET BY MOUTH DAILY   empagliflozin  10 MG Tabs tablet Commonly known as: Jardiance  Take 1 tablet (10 mg total) by mouth daily before breakfast.    furosemide  40 MG tablet Commonly known as: LASIX  Take 40-60 mg by mouth See admin instructions. Take 40 mg by mouth every other day, alternating with 60 mg on alternate days.   metoprolol  succinate 25 MG 24 hr tablet Commonly known as: Toprol  XL Take 1 tablet (25 mg total) by mouth in the morning AND 2 tablets (50 mg total) at bedtime.   multivitamin with minerals Tabs tablet Take 1 tablet by mouth in the morning.   multivitamin-lutein Caps capsule Take 1 capsule by mouth daily.   potassium chloride  SA 20 MEQ tablet Commonly known as: KLOR-CON  M Take 1 tablet (20 mEq total) by mouth once for 1 dose. Started by: [name] [name]   rivaroxaban  20 MG Tabs tablet Commonly known as: Xarelto  Take 1 tablet (20 mg total) by mouth daily with supper.   traMADol  50 MG tablet Commonly known as: ULTRAM  Take 1 tablet (50 mg total) by mouth every 6 (six) hours as needed (mild pain).        Review of Systems  Respiratory: Negative.  Negative for cough and shortness of breath.   Cardiovascular: Negative.  Negative for chest pain and leg swelling.      BP 114/67 (BP  Location: Right Arm)   Pulse 71   Resp 18   Ht 5' 8 (1.727 m)   Wt 155 lb (70.3 kg)   SpO2 98%   BMI 23.57 kg/m   Physical Exam Constitutional:      Appearance: Normal appearance.  HENT:     Head: Normocephalic and atraumatic.  Musculoskeletal:     Cervical back: Normal range of motion.  Skin:    General: Skin is warm and dry.      Neurological:     General: No focal deficit present.     Mental Status: She is alert.     Imaging:  CLINICAL DATA:  Postop from thoracotomy. Pleural effusion. Breast carcinoma.   EXAM: CHEST - 2 VIEW   COMPARISON:  09/30/2023   FINDINGS: Stable mild cardiomegaly. AICD remains in place, as well as epicardial pacer leads.   Increased size of moderate left pleural effusion is seen with left basilar atelectasis. New small right pleural effusion is seen. No pneumothorax  visualized. Surgical clips again seen in right axilla.   IMPRESSION: Increased size of moderate left pleural effusion and left basilar atelectasis.   New small right pleural effusion.     Electronically Signed   By: [name] [name] M.D.   On: 10/13/2023 11:53  A/P:  Visit for wound check -Chest tube site is without erythema and swelling.  She is to continue to use gauze over the incision area to help with drainage.  Since drainage continues to decrease with time will have her continue to monitor -If drainage continues or increases she should call back the office -Follow up appointment in one week with chest xray  S/P thoracotomy -Left sided pleural effusion is present at this time. She is to take lasix  daily for the 5 days to see if this improves pleural effusion.    [name] HERO Granby, PA-C 2:04 PM 10/13/23

## 2023-10-14 ENCOUNTER — Encounter: Admitting: Surgery

## 2023-10-15 DIAGNOSIS — G453 Amaurosis fugax: Secondary | ICD-10-CM | POA: Diagnosis not present

## 2023-10-15 DIAGNOSIS — I420 Dilated cardiomyopathy: Secondary | ICD-10-CM | POA: Diagnosis not present

## 2023-10-15 DIAGNOSIS — Z95810 Presence of automatic (implantable) cardiac defibrillator: Secondary | ICD-10-CM | POA: Diagnosis not present

## 2023-10-15 DIAGNOSIS — Z79899 Other long term (current) drug therapy: Secondary | ICD-10-CM | POA: Diagnosis not present

## 2023-10-16 ENCOUNTER — Telehealth: Payer: Self-pay

## 2023-10-20 ENCOUNTER — Other Ambulatory Visit: Payer: Self-pay | Admitting: Surgery

## 2023-10-20 DIAGNOSIS — Z9889 Other specified postprocedural states: Secondary | ICD-10-CM

## 2023-10-21 ENCOUNTER — Ambulatory Visit (HOSPITAL_COMMUNITY)
Admission: RE | Admit: 2023-10-21 | Discharge: 2023-10-21 | Disposition: A | Discharge status: Home / Self Care | Source: Ambulatory Visit | Attending: Internal Medicine | Admitting: Internal Medicine

## 2023-10-21 ENCOUNTER — Ambulatory Visit: Attending: Surgery | Admitting: Surgery

## 2023-10-21 ENCOUNTER — Ambulatory Visit: Admitting: Surgery

## 2023-10-21 VITALS — BP 119/65 | HR 78 | Resp 18 | Ht 68.0 in | Wt 151.0 lb

## 2023-10-21 DIAGNOSIS — Z9889 Other specified postprocedural states: Secondary | ICD-10-CM | POA: Diagnosis not present

## 2023-10-21 DIAGNOSIS — J90 Pleural effusion, not elsewhere classified: Secondary | ICD-10-CM | POA: Diagnosis not present

## 2023-10-21 DIAGNOSIS — Z95810 Presence of automatic (implantable) cardiac defibrillator: Secondary | ICD-10-CM | POA: Diagnosis not present

## 2023-10-21 DIAGNOSIS — I700 Atherosclerosis of aorta: Secondary | ICD-10-CM | POA: Diagnosis not present

## 2023-10-21 DIAGNOSIS — R918 Other nonspecific abnormal finding of lung field: Secondary | ICD-10-CM | POA: Diagnosis not present

## 2023-10-21 NOTE — Progress Notes (Signed)
 [address] [place] [address]             [phone]     HPI: Patient returns for routine postoperative follow-up having undergone left thoracotomy for insertion of an LV epicardial pacing lead and revision to biventricular pacing system with generator change out on 09/28/2023. The patient's early postoperative recovery while in the hospital was notable for an uncomplicated postoperative course.  She was seen in our office by our PA on 10/13/2023 and was doing well but chest x-ray showed a moderate size left pleural effusion and she was draining some serosanguineous fluid from the chest tube site.  She was treated with a 5-day course of Lasix .  She continues to feel well and denies any shortness of breath.  Her energy level is improving.  She has had no peripheral edema.  She was on Entresto  preoperatively which we did not start prior to discharge due to her blood pressure running on the low side.    Current Outpatient Medications  Medication Sig Dispense Refill   ACCU-CHEK GUIDE TEST test strip daily.     Accu-Chek Softclix Lancets lancets daily.     acetaminophen  (TYLENOL ) 500 MG tablet Take 1-2 tablets (500-1,000 mg total) by mouth every 6 (six) hours as needed.     anastrozole  (ARIMIDEX ) 1 MG tablet Take 1 mg by mouth in the morning.     atorvastatin  (LIPITOR) 20 MG tablet Take 1 tablet (20 mg total) by mouth daily. (Patient taking differently: Take 20 mg by mouth every evening.) 30 tablet 2   cholecalciferol  (VITAMIN D3) 25 MCG (1000 UNIT) tablet Take 1,000 Units by mouth daily.     Collagen-Vitamin C-Biotin (COLLAGEN PO) Take 1 Scoop by mouth daily.     digoxin  (LANOXIN ) 0.125 MG tablet TAKE 1/2 TABLET BY MOUTH DAILY 45 tablet 3   empagliflozin  (JARDIANCE ) 10 MG TABS tablet Take 1 tablet (10 mg total) by mouth daily before breakfast. 90 tablet 3   furosemide  (LASIX ) 40 MG tablet Take 40-60 mg by mouth See admin instructions. Take 40 mg by mouth every other  day, alternating with 60 mg on alternate days.     metoprolol  succinate (TOPROL  XL) 25 MG 24 hr tablet Take 1 tablet (25 mg total) by mouth in the morning AND 2 tablets (50 mg total) at bedtime. 270 tablet 3   Multiple Vitamin (MULTIVITAMIN WITH MINERALS) TABS tablet Take 1 tablet by mouth in the morning.     multivitamin-lutein (OCUVITE-LUTEIN) CAPS capsule Take 1 capsule by mouth daily.     potassium chloride  SA (KLOR-CON  M) 20 MEQ tablet Take 1 tablet (20 mEq total) by mouth once for 1 dose. 14 tablet 0   rivaroxaban  (XARELTO ) 20 MG TABS tablet Take 1 tablet (20 mg total) by mouth daily with supper. 90 tablet 3   No current facility-administered medications for this visit.    Physical Exam: BP 119/65   Pulse 78   Resp 18   Ht 5' 8 (1.727 m)   Wt 151 lb (68.5 kg)   SpO2 96%   BMI 22.96 kg/m  She looks well. Cardiac exam shows a regular rate and rhythm with normal heart sounds.  There is no murmur. Lungs are clear. Her incisions are healing well. There is no peripheral edema.   Diagnostic Tests:  Chest x-ray today shows a small residual left pleural effusion that is markedly reduced from last week.  Lungs are otherwise clear  Impression:  Overall she is doing very well 3 weeks out from her surgery.  I told her she can return to driving a car at this time but should be careful with heavy lifting for about 3 months postoperatively.  I did not restart Entresto  today due to her blood pressure being in the low 100s at home and 119/65 today.  She is going to see Dr. Lavona in the next couple weeks and he can decide about resuming at that time.  Plan:  She will continue to follow-up with Dr. Lavona, Dr. Cindie, and her PCP and will return to see me if she has any problems with her incisions.   Dorise MARLA Fellers, MD [facility] [phone]

## 2023-10-22 ENCOUNTER — Other Ambulatory Visit: Payer: Self-pay

## 2023-10-22 NOTE — Transitions of Care (Post Inpatient/ED Visit) (Signed)
 Transition of Care week 3  Visit Note  10/22/2023  Name: [name] [name] MRN: [id number]          DOB: Apr 15, 1950  Situation: Patient enrolled in [facility] 30-day program. Visit completed with patient by telephone.   Background:   Initial Transition Care Management Follow-up Telephone Call    Past Medical History:  Diagnosis Date   AICD (automatic cardioverter/defibrillator) present    Arthritis    thumbs   Breast cancer (HCC)    Right 1994 and Left 2023   Cerebrovascular disease    s/p right MCA cardioemoblic infarct 8/71/89   CHF (congestive heart failure) (HCC)    Chronic kidney disease    History of kidney stones    Nonischemic cardiomyopathy (HCC)    a. +LMNA mutation b. s/p STJ dual chamber ICD   Pneumonia    Pre-diabetes    Presence of permanent cardiac pacemaker    Stroke (HCC) 02/2008   no deficits   Stroke (HCC)    05/27/23 right eye and Left eye 06/2023 strokes no deficits    Assessment: Patient Reported Symptoms: Cognitive Cognitive Status: No symptoms reported, Alert and oriented to person, place, and time, Insightful and able to interpret abstract concepts, Normal speech and language skills      Neurological Neurological Review of Symptoms: No symptoms reported    HEENT HEENT Symptoms Reported: No symptoms reported      Cardiovascular Cardiovascular Symptoms Reported: No symptoms reported Does patient have uncontrolled Hypertension?: No Cardiovascular Management Strategies: Activity, Coping strategies, Medical device, Medication therapy, Routine screening Cardiovascular Self-Management Outcome: 4 (good)  Respiratory Respiratory Symptoms Reported: No symptoms reported Additional Respiratory Details: I am doing well. Respiratory Management Strategies: Activity, Mechanical ventilation, Coping strategies, Medication therapy Respiratory Self-Management Outcome: 4 (good)  Endocrine Endocrine Symptoms Reported: No symptoms reported Is patient diabetic?:  Yes Is patient checking blood sugars at home?: Yes Endocrine Self-Management Outcome: 4 (good)  Gastrointestinal Gastrointestinal Symptoms Reported: No symptoms reported      Genitourinary Genitourinary Symptoms Reported: No symptoms reported    Integumentary Integumentary Symptoms Reported: No symptoms reported Additional Integumentary Details: All healed up now Skin Self-Management Outcome: 4 (good)  Musculoskeletal Musculoskelatal Symptoms Reviewed: No symptoms reported        Psychosocial Psychosocial Symptoms Reported: No symptoms reported         There were no vitals filed for this visit.  Medications Reviewed Today     Reviewed by Carolee Heron NOVAK, RN (Case Manager) on 10/22/23 at 1657  Med List Status: <None>   Medication Order Taking? Sig Documenting Provider Last Dose Status Informant  ACCU-CHEK GUIDE TEST test strip [id number] Yes daily. [provider]  Active Self  Accu-Chek Softclix Lancets lancets [id number] Yes daily. [provider]  Active Self  acetaminophen  (TYLENOL ) 500 MG tablet [id number] Yes Take 1-2 tablets (500-1,000 mg total) by mouth every 6 (six) hours as needed. Barrett, Rocky JONELLE, PA-C  Active   anastrozole  (ARIMIDEX ) 1 MG tablet [id number] Yes Take 1 mg by mouth in the morning. [provider]  Active Self  atorvastatin  (LIPITOR) 20 MG tablet [id number] Yes Take 1 tablet (20 mg total) by mouth daily.  Patient taking differently: Take 20 mg by mouth every evening.   Pearlean Manus, MD  Active Self  cholecalciferol  (VITAMIN D3) 25 MCG (1000 UNIT) tablet [id number] Yes Take 1,000 Units by mouth daily. [provider]  Active Self  Collagen-Vitamin C-Biotin (COLLAGEN PO) [id number] Yes Take 1 Scoop by mouth daily. [provider]  Active Self  digoxin  (LANOXIN ) 0.125 MG tablet [id number] Yes TAKE 1/2 TABLET BY MOUTH DAILY Fernande Elspeth BROCKS, MD  Active   empagliflozin  (JARDIANCE ) 10 MG TABS tablet [id number] Yes Take 1  tablet (10 mg total) by mouth daily before breakfast. Lavona Agent, MD  Active Self  furosemide  (LASIX ) 40 MG tablet [id number] Yes Take 40-60 mg by mouth See admin instructions. Take 40 mg by mouth every other day, alternating with 60 mg on alternate days. [provider]  Active Self  metoprolol  succinate (TOPROL  XL) 25 MG 24 hr tablet [id number] Yes Take 1 tablet (25 mg total) by mouth in the morning AND 2 tablets (50 mg total) at bedtime. Lavona Agent, MD  Active Self  Multiple Vitamin (MULTIVITAMIN WITH MINERALS) TABS tablet [id number] Yes Take 1 tablet by mouth in the morning. [provider]  Active Self  multivitamin-lutein (OCUVITE-LUTEIN) CAPS capsule [id number] Yes Take 1 capsule by mouth daily. [provider]  Active Self  potassium chloride  SA (KLOR-CON  M) 20 MEQ tablet [id number]  Take 1 tablet (20 mEq total) by mouth once for 1 dose.  Patient not taking: Reported on 10/22/2023   Rutha Manuelita HERO, PA-C  Expired 10/21/23 2359   rivaroxaban  (XARELTO ) 20 MG TABS tablet [id number] Yes Take 1 tablet (20 mg total) by mouth daily with supper. Lavona Agent, MD  Active Self            Goals Addressed             This Visit's Progress    VBCI Transitions of Care (TOC) Care Plan   On track    Reviewed 10/22/23 with patient on a call.   Problems:  Recent Hospitalization for treatment of Left Thoracotomy with insertion of LV epicardial pacing leads and revision to biventricular pacing system with generator changeout. Cleared by cardiothoracic provider per patient stated on 10/22/23.  Goal:  Over the next 30 days, the patient will not experience hospital readmission  Interventions:  Transitions of Care:  Annual Eye Exam Patient has an upcoming appointment reviewed with patient on 10/15/23.  Doctor Visits  - discussed the importance of doctor visits Post discharge activity limitations prescribed by provider reviewed Post-op wound/incision care reviewed  with patient/caregiver Reviewed Signs and symptoms of infection Reviewed medication list including medications she is to hold till see PCP on 10/10/23 at 1030 am.  Completed/Medications reviewed.  No changes per patient stated.  Reviewed upcoming follow up appointments and those completed.  Discussed family support of which she states she as great support.  Reviewed SDOH: No needs at this time  Discussed continuing home monitoring of BP, blood sugars and daily weights, and also SpO2 home monitoring device.  Reviewed and discussed discharge instructions and when to call provider.  Patient had post procedure during hospitalization chest drain stitch removed in the office yesterday and has been having to change dressing every hour or more overnight.  No issues now reported on 10/22/23.  Was released by Cardiothoracic provider.   Patient Self Care Activities:  Attend all scheduled provider appointments Attend church or other social activities Call pharmacy for medication refills 3-7 days in advance of running out of medications Call provider office for new concerns or questions  Notify RN Care Manager of TOC call rescheduling needs Participate in Transition of Care Program/Attend TOC scheduled calls Perform all self care activities independently  Take medications as prescribed   Per discussion has restarted taking blood pressure, daily weights, blood sugars, also  SpO2% monitorin Plan:  Telephonic call scheduled for next week on 10/29/23         10/22/23: TOC week 3 completed today after unable to reach last attempt. Patient states she is doing well, no issues, was released from cardiothoracic provider. Had post discharge PCP HFU on 10/10/23. Will follow up next week for closure potential.  Recommendation:   Continue Current Plan of Care  Follow Up Plan:   Telephone follow-up in 1 week   Bing Edison MSN, RN RN Case Manager [facility]  VBCI-Population Health Office Hours M-F  [phone] Direct Dial: [phone] Main Phone [phone]  Fax: [phone] [email or web address]

## 2023-10-22 NOTE — Patient Instructions (Signed)
 Visit Information  Thank you for taking time to visit with me today. Please don't hesitate to contact me if I can be of assistance to you before our next scheduled telephone appointment.  Our next appointment is by telephone on 10/29/23 at 1000  Following is a copy of your care plan:   Goals Addressed             This Visit's Progress    VBCI Transitions of Care (TOC) Care Plan   On track    Reviewed 10/22/23 with patient on a call.   Problems:  Recent Hospitalization for treatment of Left Thoracotomy with insertion of LV epicardial pacing leads and revision to biventricular pacing system with generator changeout. Cleared by cardiothoracic provider per patient stated on 10/22/23.  Goal:  Over the next 30 days, the patient will not experience hospital readmission  Interventions:  Transitions of Care:  Annual Eye Exam Patient has an upcoming appointment reviewed with patient on 10/15/23.  Doctor Visits  - discussed the importance of doctor visits Post discharge activity limitations prescribed by provider reviewed Post-op wound/incision care reviewed with patient/caregiver Reviewed Signs and symptoms of infection Reviewed medication list including medications she is to hold till see PCP on 10/10/23 at 1030 am.  Completed/Medications reviewed.  No changes per patient stated.  Reviewed upcoming follow up appointments and those completed.  Discussed family support of which she states she as great support.  Reviewed SDOH: No needs at this time  Discussed continuing home monitoring of BP, blood sugars and daily weights, and also SpO2 home monitoring device.  Reviewed and discussed discharge instructions and when to call provider.  Patient had post procedure during hospitalization chest drain stitch removed in the office yesterday and has been having to change dressing every hour or more overnight.  No issues now reported on 10/22/23.  Was released by Cardiothoracic provider.   Patient Self  Care Activities:  Attend all scheduled provider appointments Attend church or other social activities Call pharmacy for medication refills 3-7 days in advance of running out of medications Call provider office for new concerns or questions  Notify RN Care Manager of TOC call rescheduling needs Participate in Transition of Care Program/Attend TOC scheduled calls Perform all self care activities independently  Take medications as prescribed   Per discussion has restarted taking blood pressure, daily weights, blood sugars, also SpO2% monitorin Plan:  Telephonic call scheduled for next week on 10/29/23         Patient verbalizes understanding of instructions and care plan provided today and agrees to view in MyChart. Active MyChart status and patient understanding of how to access instructions and care plan via MyChart confirmed with patient.     Telephone follow up appointment with care management team member scheduled for: 10/29/23 at 1000 am  Please call the care guide team at [phone] if you need to cancel or reschedule your appointment.   Please call the Suicide and Crisis Lifeline: 988 call 1-800-273-TALK (toll free, 24 hour hotline) call 911 if you are experiencing a Mental Health or Behavioral Health Crisis or need someone to talk to.   Bing Edison MSN, RN RN Case Sales executive Health  VBCI-Population Health Office Hours M-F [phone] Direct Dial: [phone] Main Phone [phone]  Fax: [phone] [email or web address]

## 2023-10-23 ENCOUNTER — Telehealth: Payer: Self-pay

## 2023-10-29 ENCOUNTER — Other Ambulatory Visit: Payer: Self-pay

## 2023-10-29 NOTE — Patient Instructions (Signed)
 Visit Information  Thank you for taking time to visit with me today and over the past weeks.   All your goals have been met for the [facility] post discharge program to try to prevent preventable readmissions in conjunction with your ongoing (superceding) medical care and instructions.   Congratulations and continue with your current plan and self care activities, all provider appointments and report any concerns or changes to PCP or specialists.   Following is a copy of your care plan:   Goals Addressed             This Visit's Progress    VBCI Transitions of Care (TOC) Care Plan       Reviewed 10/29/23 with patient on a TOC Week 4 call.   Problems:  Recent Hospitalization for treatment of Left Thoracotomy with insertion of LV epicardial pacing leads and revision to biventricular pacing system with generator changeout. History of strokes and CHF.  05/27/23 right eye and Left eye 06/2023 strokes no deficits. Ongoing evaluations/treatments for CHF and cardiology follow ups.  Cleared by cardiothoracic provider per patient stated on 10/22/23.  Follow up on 11/04/23 with Cardiologist.  AICD (automatic cardioverter/defibrillator) present and post procedure listed above.  Goal:  Over the next 30 days, the patient will not experience hospital readmission  Interventions: Education, Review, follow up or actions taken.  Transitions of Care:  Reviewed Care Gaps noted in EPIC with patient: To review with PCP on next follow up.  GI Assessment:  Colonoscopy rescheduled from April 2025 to November 2025 after cleared by cardiology.  [facility] exam completed at [facility] on 10/15/23 for eye condition.   05/27/23 right eye and Left eye 06/2023 strokes no deficits. Discussed Flu and Covid vaccines for 2025 and will discuss with PCP.   Noted patient is due for DTaP if not given elsewhere--patient to check with PCP next visit.   Patient reports bone density scan several years ago--advised to check with PCP on next  screening.   Mammogram: NA/Bilateral breast cancer mastectomy RIGHT 1994 and LEFT 2023.   Annual Eye Exam Patient has an upcoming appointment reviewed with patient on 10/15/23.  Stroke was captured in eye on pre-op  for colonoscopy in April 2025.  Doctor Visits  - discussed the importance of doctor visits. Post discharge activity limitations prescribed by provider reviewed. Post-op wound/incision care reviewed with patient/caregiver: all healed up . Reviewed Signs and symptoms of infection; none reported by patient after reviewing.  Reviewed medication list including medications she is to hold till see PCP on 10/10/23 at 1030 am.  Completed/Medications reviewed.  No changes per patient stated or noted on medication list review on 10/29/23.  Reviewed blood thinner precautions and to report any fails or bumps to head even without a fall.  Patient has prior history of strokes and CHF:  Patient verbalized understanding of past issues, home monitoring to continue doing, and signs & symptoms to report to providers.  No home health or OP PT was ordered, no mobility deficits noted.  History of CHF: Entresto  and Spironolactone  on hold till sees cardiologist per patient and DC Medications list.  Monitoring weight and blood pressures at home.  No current issues reported, no SOB, No acute chest pain, patient is getting around the house with issues, and performing ADLs independently or with 1 assist from family if needed.  Reviewed upcoming follow up appointments and those completed.  Discussed family support of which she states she as great support.  Reviewed SDOH: No needs at this  time. No changes on 10/29/23.  Discussed continuing home monitoring of BP, heart rate, blood sugars, and daily weights, and also SpO2 home monitoring device as needed and at least 3X per week to capture early changes or trends that may need to be reported to providers given some changes since hospitalization.  Reviewed and  discussed discharge instructions and when to call provider.  No issues reported on 10/29/23.  Was released by Cardiothoracic provider but follow up with Cardiologist/Pacemaker on 11/05/23.   Patient Self Care Activities:  Attend all scheduled provider appointments Attend church or other social activities Call pharmacy for medication refills 3-7 days in advance of running out of medications Call provider office for new concerns or questions  Notify RN Care Manager of TOC call rescheduling needs Participate in Transition of Care Program/Attend TOC scheduled calls Perform all self care activities independently  Take medications as prescribed   Per discussion has restarted taking blood pressure, daily weights, blood sugars, also SpO2% monitoring at least 3X/week now that patient is stable.  Report any new changes or symptoms reviewed and discussed during past weeks to provider   As long as in accordance with any provider instructions that would supercede these instructions.  Review educational materials attached to the After Visit Summary, some of which may be repeats or known to you, but as a review for ongoing self care management.  Plan:  All goals met for the TOC 30 day program Declined need for CCM referral.  No further follow up needed at this time.  Patient has contact information via MyChart and has great support at home.  RN CM informed patient this was last call and she had met all goals as of today.  Patient verbalized understanding and was verbally very thankful for the care given.  Approved educational materials included on AVS   Bing Edison MSN, RN RN Case Manager [facility]  VBCI-Population Health Office Hours M-F [phone] Direct Dial: [phone] Main Phone [phone]  Fax: [phone] [email or web address]           Patient verbalizes understanding of instructions and care plan provided today and agrees to view in MyChart. Active MyChart status and patient  understanding of how to access instructions and care plan via MyChart confirmed with patient.     The patient has been provided with contact information for the care management team and has been advised to call with any health related questions or concerns.   Please call the care guide team at [phone] if you need to cancel or reschedule your appointment.   Please call the Suicide and Crisis Lifeline: 988 call 1-800-273-TALK (toll free, 24 hour hotline) call 911 if you are experiencing a Mental Health or Behavioral Health Crisis or need someone to talk to.  See educational attachments and ask providers if you have questions or concerns.    Bing Edison MSN, RN RN Case Sales executive Health  VBCI-Population Health Office Hours M-F [phone] Direct Dial: [phone] Main Phone [phone]  Fax: [phone] [email or web address]

## 2023-10-29 NOTE — Transitions of Care (Post Inpatient/ED Visit) (Signed)
 Transition of Care week 4  Visit Note  10/29/2023  Name: [name] [name] MRN: [id number]          DOB: [birth year]  Situation: Patient enrolled in [facility] 30-day program. Visit completed with patient by telephone.   Background:   Recent Hospitalization for treatment of Left Thoracotomy with insertion of LV epicardial pacing leads and revision to biventricular pacing system with generator changeout. History of strokes and CHF.  05/27/23 right eye and Left eye 06/2023 strokes no deficits. Ongoing evaluations/treatments for CHF and cardiology follow ups.  Cleared by cardiothoracic provider per patient stated on 10/22/23.  Follow up on 11/04/23 with Cardiologist.  AICD (automatic cardioverter/defibrillator) present and post procedure listed above.   Initial Transition Care Management Follow-up Telephone Call    Past Medical History:  Diagnosis Date   AICD (automatic cardioverter/defibrillator) present    Arthritis    thumbs   Breast cancer (HCC)    Right 1994 and Left 2023   Cerebrovascular disease    s/p right MCA cardioemoblic infarct 8/71/89   CHF (congestive heart failure) (HCC)    Chronic kidney disease    History of kidney stones    Nonischemic cardiomyopathy (HCC)    a. +LMNA mutation b. s/p STJ dual chamber ICD   Pneumonia    Pre-diabetes    Presence of permanent cardiac pacemaker    Stroke (HCC) 02/2008   no deficits   Stroke (HCC)    05/27/23 right eye and Left eye 06/2023 strokes no deficits    Assessment: Patient Reported Symptoms: Cognitive Cognitive Status: No symptoms reported, Alert and oriented to person, place, and time, Normal speech and language skills, Insightful and able to interpret abstract concepts      Neurological Neurological Review of Symptoms: No symptoms reported    HEENT HEENT Symptoms Reported: No symptoms reported HEENT Management Strategies: Routine screening, Coping strategies HEENT Comment: 05/27/23 right eye and Left eye 06/2023 strokes no  deficits per DC Summary/history    Cardiovascular Cardiovascular Symptoms Reported: No symptoms reported Does patient have uncontrolled Hypertension?: No Weight: 146 lb 12.8 oz (66.6 kg) Cardiovascular Self-Management Outcome: 4 (good) Cardiovascular Comment: Cardiology follow up 11/04/23.  Respiratory Respiratory Symptoms Reported: No symptoms reported Additional Respiratory Details: Patient sounds a bit hoarse but reports she is doing well with no issues, has Cardiology follow up on 11/04/23. Respiratory Management Strategies: Activity, Coping strategies, Medication therapy, Routine screening, Weight management Respiratory Self-Management Outcome: 4 (good)  Endocrine Endocrine Symptoms Reported: No symptoms reported Is patient diabetic?: Yes Is patient checking blood sugars at home?: Yes List most recent blood sugar readings, include date and time of day: 125 Endocrine Self-Management Outcome: 4 (good)  Gastrointestinal Gastrointestinal Symptoms Reported: No symptoms reported Additional Gastrointestinal Details: Was cooking breakfast for the first time this am. Gastrointestinal Comment: Patient will eschedule colonoscopy for November 2025 per patient stated. Was postponed due to cardiac issues.    Genitourinary Genitourinary Symptoms Reported: No symptoms reported    Integumentary Integumentary Symptoms Reported: No symptoms reported Skin Management Strategies: Routine screening Skin Self-Management Outcome: 4 (good)  Musculoskeletal Musculoskelatal Symptoms Reviewed: No symptoms reported Musculoskeletal Management Strategies: Activity, Adequate rest, Coping strategies, Exercise, Routine screening, Medication therapy Musculoskeletal Self-Management Outcome: 4 (good) Falls in the past year?: No Fall risk Follow up: Falls prevention discussed  Psychosocial Psychosocial Symptoms Reported: No symptoms reported Behavioral Management Strategies: Support system Behavioral Health  Self-Management Outcome: 4 (good) Major Change/Loss/Stressor/Fears (CP): Medical condition, self Techniques to Cope with Loss/Stress/Change: Diversional activities Quality of  Family Relationships: helpful, involved, supportive Do you feel physically threatened by others?: No   Vitals:   10/29/23 1028  BP: 101/63  Pulse: 70    Medications Reviewed Today     Reviewed by Carolee Heron NOVAK, RN (Case Manager) on 10/29/23 at 1022  Med List Status: <None>   Medication Order Taking? Sig Documenting Provider Last Dose Status Informant  ACCU-CHEK GUIDE TEST test strip [id number] Yes daily. [provider]  Active Self  Accu-Chek Softclix Lancets lancets [id number] Yes daily. [provider]  Active Self  acetaminophen  (TYLENOL ) 500 MG tablet [id number] Yes Take 1-2 tablets (500-1,000 mg total) by mouth every 6 (six) hours as needed. Barrett, Rocky SAUNDERS, PA-C  Active   anastrozole  (ARIMIDEX ) 1 MG tablet [id number] Yes Take 1 mg by mouth in the morning. [provider]  Active Self  atorvastatin  (LIPITOR) 20 MG tablet [id number] Yes Take 1 tablet (20 mg total) by mouth daily.  Patient taking differently: Take 20 mg by mouth every evening.   Pearlean Manus, MD  Active Self  cholecalciferol  (VITAMIN D3) 25 MCG (1000 UNIT) tablet [id number] Yes Take 1,000 Units by mouth daily. [provider]  Active Self  Collagen-Vitamin C-Biotin (COLLAGEN PO) [id number] Yes Take 1 Scoop by mouth daily. [provider]  Active Self  digoxin  (LANOXIN ) 0.125 MG tablet [id number] Yes TAKE 1/2 TABLET BY MOUTH DAILY Fernande Elspeth BROCKS, MD  Active   empagliflozin  (JARDIANCE ) 10 MG TABS tablet [id number] Yes Take 1 tablet (10 mg total) by mouth daily before breakfast. Lavona Agent, MD  Active Self  furosemide  (LASIX ) 40 MG tablet [id number] Yes Take 40-60 mg by mouth See admin instructions. Take 40 mg by mouth every other day, alternating with 60 mg on alternate days. [provider]   Active Self  metoprolol  succinate (TOPROL  XL) 25 MG 24 hr tablet [id number] Yes Take 1 tablet (25 mg total) by mouth in the morning AND 2 tablets (50 mg total) at bedtime. Lavona Agent, MD  Active Self  Multiple Vitamin (MULTIVITAMIN WITH MINERALS) TABS tablet [id number] Yes Take 1 tablet by mouth in the morning. [provider]  Active Self  multivitamin-lutein (OCUVITE-LUTEIN) CAPS capsule [id number] Yes Take 1 capsule by mouth daily. [provider]  Active Self  potassium chloride  SA (KLOR-CON  M) 20 MEQ tablet [id number]  Take 1 tablet (20 mEq total) by mouth once for 1 dose.  Patient not taking: Reported on 10/29/2023   Rutha Manuelita HERO, PA-C  Expired 10/21/23 2359   rivaroxaban  (XARELTO ) 20 MG TABS tablet [id number] Yes Take 1 tablet (20 mg total) by mouth daily with supper. Lavona Agent, MD  Active Self            Goals Addressed             This Visit's Progress    VBCI Transitions of Care [facility]) Care Plan       Reviewed 10/29/23 with patient on a TOC Week 4 call.   Problems:  Recent Hospitalization for treatment of Left Thoracotomy with insertion of LV epicardial pacing leads and revision to biventricular pacing system with generator changeout. History of strokes and CHF.  05/27/23 right eye and Left eye 06/2023 strokes no deficits. Ongoing evaluations/treatments for CHF and cardiology follow ups.  Cleared by cardiothoracic provider per patient stated on 10/22/23.  Follow up on 11/04/23 with Cardiologist.  AICD (automatic cardioverter/defibrillator) present and post procedure listed above.  Goal:  Over the next 30 days, the patient will not experience  hospital readmission.  Completed 10/29/23.   Interventions: Education, Review, follow up or actions taken.  Transitions of Care:  Reviewed Care Gaps noted in EPIC with patient: To review with PCP on next follow up.  GI Assessment:  Colonoscopy rescheduled from April 2025 to November 2025 after cleared by  cardiology.  [facility] exam completed at [facility] on 10/15/23 for eye condition.   05/27/23 right eye and Left eye 06/2023 strokes no deficits. Discussed Flu and Covid vaccines for 2025 and will discuss with PCP.   Noted patient is due for DTaP if not given elsewhere--patient to check with PCP next visit.   Patient reports bone density scan several years ago--advised to check with PCP on next screening.   Mammogram: NA/Bilateral breast cancer mastectomy RIGHT 1994 and LEFT 2023.   Annual Eye Exam Patient has an upcoming appointment reviewed with patient on 10/15/23.  Stroke was captured in eye on pre-op  for colonoscopy in April 2025.  Doctor Visits  - discussed the importance of doctor visits. Post discharge activity limitations prescribed by provider reviewed. Post-op wound/incision care reviewed with patient/caregiver: all healed up . Reviewed Signs and symptoms of infection; none reported by patient after reviewing.  Reviewed medication list including medications she is to hold till see PCP on 10/10/23 at 1030 am.  Completed/Medications reviewed.  No changes per patient stated or noted on medication list review on 10/29/23.  Reviewed blood thinner precautions and to report any fails or bumps to head even without a fall.  Patient has prior history of strokes and CHF:  Patient verbalized understanding of past issues, home monitoring to continue doing, and signs & symptoms to report to providers.  No home health or OP PT was ordered, no mobility deficits noted.  History of CHF: Entresto  and Spironolactone  on hold till sees cardiologist per patient and DC Medications list.  Monitoring weight and blood pressures at home.  No current issues reported, no SOB, No acute chest pain, patient is getting around the house with issues, and performing ADLs independently or with 1 assist from family if needed.  Reviewed upcoming follow up appointments and those completed.  Discussed family support of which she states  she as great support.  Reviewed SDOH: No needs at this time. No changes on 10/29/23.  Discussed continuing home monitoring of BP, heart rate, blood sugars, and daily weights, and also SpO2 home monitoring device as needed and at least 3X per week to capture early changes or trends that may need to be reported to providers given some changes since hospitalization.  Reviewed and discussed discharge instructions and when to call provider.  No issues reported on 10/29/23.  Was released by Cardiothoracic provider but follow up with Cardiologist/Pacemaker on 11/05/23.   Patient Self Care Activities:  Attend all scheduled provider appointments Attend church or other social activities Call pharmacy for medication refills 3-7 days in advance of running out of medications Call provider office for new concerns or questions  Notify RN Care Manager of TOC call rescheduling needs Participate in Transition of Care Program/Attend TOC scheduled calls Perform all self care activities independently  Take medications as prescribed   Per discussion has restarted taking blood pressure, daily weights, blood sugars, also SpO2% monitoring at least 3X/week now that patient is stable.  Report any new changes or symptoms reviewed and discussed during past weeks to provider   As long as in accordance with any provider instructions that would supercede these instructions.  Review educational materials attached to the After  Visit Summary, some of which may be repeats or known to you, but as a review for ongoing self care management.  Plan:  All goals met for the TOC 30 day program.  Continue with current plan for self care activities.  Declined need for CCM referral.  No further follow up needed at this time.  Patient has contact information via MyChart and has great support at home.  RN CM informed patient this was last call and she had met all goals as of today for recent hospitalization problems/issues and  review/education/discussion of associated problems, care gaps to discuss with PCP, what to call providers for, continued home monitoring of blood pressures, blood sugars, weights, and SpO2% with logs to take to providers as you have been doing so well.  Patient verbalized understanding and was verbally very thankful for the care given.  Approved educational materials included on AVS   Bing Edison MSN, RN RN Case Manager [facility]  VBCI-Population Health Office Hours M-F [phone] Direct Dial: [phone] Main Phone [phone]  Fax: [phone] [email or web address]           Recommendation:   Continue Current Plan of Care  Follow Up Plan:   Patient has met all care management goals. Care Management case will be closed. Patient has been provided contact information should new needs arise.   Patient expressed appreciation and thankfulness for care given during post discharge follow up call over past weeks.    Bing Edison MSN, RN RN Case Sales executive Health  VBCI-Population Health Office Hours M-F [phone] Direct Dial: [phone] Main Phone [phone]  Fax: [phone] [email or web address]

## 2023-10-31 DIAGNOSIS — E7849 Other hyperlipidemia: Secondary | ICD-10-CM | POA: Diagnosis not present

## 2023-10-31 DIAGNOSIS — N1831 Chronic kidney disease, stage 3a: Secondary | ICD-10-CM | POA: Diagnosis not present

## 2023-10-31 DIAGNOSIS — I5022 Chronic systolic (congestive) heart failure: Secondary | ICD-10-CM | POA: Diagnosis not present

## 2023-10-31 DIAGNOSIS — E1122 Type 2 diabetes mellitus with diabetic chronic kidney disease: Secondary | ICD-10-CM | POA: Diagnosis not present

## 2023-11-03 NOTE — Progress Notes (Unsigned)
  Cardiology Office Note:  .   Date:  11/03/2023  ID:  [name] [name], DOB September 06, 1950, MRN [id number] PCP: [name], MD  [facility] Providers Cardiologist:  Lynwood Schilling, MD Electrophysiologist:  Elspeth Sage, MD {  History of Present Illness: .   [name] [name] is a 73 y.o. female w/PMHx of  NICM Hx of LV clot, episode of  amaurosis fugax > maintained on xarelto  LBBB LAMIN cardiomyopathy   Planned for ICD upgrade to CRT though had occluded left subclavian vein and axillary vein   She had f/u with Dr. Cindie 08/26/23, discussed options, with some concerns R sided venous system may as well not be patent/fully patent, planned for epicardial LV lead implant.  09/28/23 > epicardial lead placed w/Dr. Lucas via left thoracotomy In the peri and post op period treated for UTI Post op AKI and lower BPs, was unable to resume all of her GDMT meds (held entresto  and spiro)  Discharged 10/01/23    Today's visit is scheduled as her 90 day visit ROS: ***   Device information *** epicardial LV lead placed 09/28/23  Arrhythmia/AAD hx ***  Studies Reviewed: SABRA    EKG done today and reviewed by myself:  ***  DEVICE interrogation done today and reviewed by myself *** Battery and lead measurements are good ***   ***   Risk Assessment/Calculations:    Physical Exam:   VS:  There were no vitals taken for this visit.   Wt Readings from Last 3 Encounters:  10/29/23 146 lb 12.8 oz (66.6 kg)  10/21/23 151 lb (68.5 kg)  10/13/23 155 lb (70.3 kg)    GEN: Well nourished, well developed in no acute distress NECK: No JVD; No carotid bruits CARDIAC: ***RRR, no murmurs, rubs, gallops RESPIRATORY:  *** CTA b/l without rales, wheezing or rhonchi  ABDOMEN: Soft, non-tender, non-distended EXTREMITIES: *** No edema; No deformity   PPM/ICD/ILR site: *** is stable, no thinning, fluctuation, tethering  ASSESSMENT AND PLAN: .    *** AFib *** Secondary hypercoagulable  state 2/2 AFib     {Are you ordering a CV Procedure (e.g. stress test, cath, DCCV, TEE, etc)?   Press F2        :[id number]}     Dispo: ***  Signed, Charlies Macario Arthur, PA-C

## 2023-11-04 ENCOUNTER — Encounter: Payer: Self-pay | Admitting: Physician Assistant

## 2023-11-04 ENCOUNTER — Ambulatory Visit: Attending: Cardiology | Admitting: Physician Assistant

## 2023-11-04 ENCOUNTER — Ambulatory Visit: Payer: Self-pay | Admitting: Cardiology

## 2023-11-04 VITALS — BP 96/58 | HR 69 | Ht 68.0 in | Wt 145.8 lb

## 2023-11-04 DIAGNOSIS — Z95810 Presence of automatic (implantable) cardiac defibrillator: Secondary | ICD-10-CM

## 2023-11-04 DIAGNOSIS — I428 Other cardiomyopathies: Secondary | ICD-10-CM | POA: Diagnosis not present

## 2023-11-04 DIAGNOSIS — I513 Intracardiac thrombosis, not elsewhere classified: Secondary | ICD-10-CM

## 2023-11-04 DIAGNOSIS — I5022 Chronic systolic (congestive) heart failure: Secondary | ICD-10-CM | POA: Diagnosis not present

## 2023-11-04 LAB — CUP PACEART INCLINIC DEVICE CHECK
Date Time Interrogation Session: 20250924174537
Implantable Lead Connection Status: 753985
Implantable Lead Connection Status: 753985
Implantable Lead Implant Date: 20100416
Implantable Lead Implant Date: 20100416
Implantable Lead Location: 753859
Implantable Lead Location: 753860
Implantable Lead Model: 7122
Implantable Pulse Generator Implant Date: 20170626
Pulse Gen Serial Number: 211052366

## 2023-11-04 NOTE — Patient Instructions (Signed)
 Medication Instructions:   Your physician recommends that you continue on your current medications as directed. Please refer to the Current Medication list given to you today.  *If you need a refill on your cardiac medications before your next appointment, please call your pharmacy*   Lab Work: NONE ORDERED  TODAY    If you have labs (blood work) drawn today and your tests are completely normal, you will receive your results only by: MyChart Message (if you have MyChart) OR A paper copy in the mail If you have any lab test that is abnormal or we need to change your treatment, we will call you to review the results.   Testing/Procedures: NONE ORDERED  TODAY     Follow-Up: At [facility], you and your health needs are our priority.  As part of our continuing mission to provide you with exceptional heart care, our providers are all part of one team.  This team includes your primary Cardiologist (physician) and Advanced Practice Providers or APPs (Physician Assistants and Nurse Practitioners) who all work together to provide you with the care you need, when you need it.  Your next appointment:   2 week(s)  Provider:  Charlies Arthur, PA-C  ( CONTACT  CASSIE HALL/ ANGELINE HAMMER FOR EP SCHEDULING ISSUES )   We recommend signing up for the patient portal called MyChart.  Sign up information is provided on this After Visit Summary.  MyChart is used to connect with patients for Virtual Visits (Telemedicine).  Patients are able to view lab/test results, encounter notes, upcoming appointments, etc.  Non-urgent messages can be sent to your provider as well.   To learn more about what you can do with MyChart, go to [email or web address].   Other Instructions

## 2023-11-10 DIAGNOSIS — E1122 Type 2 diabetes mellitus with diabetic chronic kidney disease: Secondary | ICD-10-CM | POA: Diagnosis not present

## 2023-11-10 DIAGNOSIS — E782 Mixed hyperlipidemia: Secondary | ICD-10-CM | POA: Diagnosis not present

## 2023-11-10 DIAGNOSIS — E559 Vitamin D deficiency, unspecified: Secondary | ICD-10-CM | POA: Diagnosis not present

## 2023-11-10 DIAGNOSIS — I5022 Chronic systolic (congestive) heart failure: Secondary | ICD-10-CM | POA: Diagnosis not present

## 2023-11-19 ENCOUNTER — Ambulatory Visit (INDEPENDENT_AMBULATORY_CARE_PROVIDER_SITE_OTHER): Payer: Medicare Other

## 2023-11-19 DIAGNOSIS — I5022 Chronic systolic (congestive) heart failure: Secondary | ICD-10-CM

## 2023-11-19 NOTE — Progress Notes (Signed)
 Remote ICD Transmission

## 2023-11-19 NOTE — Progress Notes (Unsigned)
 Cardiology Office Note:  .   Date:  11/19/2023  ID:  [name] [name], DOB 02-08-1951, MRN [id number] PCP: [name], MD  [facility] Providers Cardiologist:  Lynwood Schilling, MD Electrophysiologist:  Elspeth Sage, MD (inactive) > Dr. Cindie  History of Present Illness: .   [name] [name] is a 73 y.o. female w/PMHx of  NICM Hx of LV clot, episode of  amaurosis fugax > maintained on xarelto  LBBB LAMIN cardiomyopathy   Planned for ICD upgrade to CRT though had occluded left subclavian vein and axillary vein   08/04/23: occluded LUE subclavian and axillary vein systems   She had f/u with Dr. Cindie 08/26/23, discussed options, with some concerns R sided venous system may as well not be patent/fully patent, planned for epicardial LV lead implant.  09/28/23 > epicardial lead placed w/Dr. Lucas via left thoracotomy In the peri and post op period treated for UTI Post op AKI and lower BPs, was unable to resume all of her GDMT meds (held entresto  and spiro)  Discharged 10/01/23   CT suture removed 8/28 outpt Saw CTS service on 10/13/23, reported some drainage from CT site that was improving L pleural effusion  on CXR advised to take 5 days of lasix   Saw Dr. Lucas on 10/21/23, doing well, sites healing well, to f/u with him as needed Maintained off Entresto  with lower BPs  I saw her 11/04/23 She is accompanied by her husband. She is doing well No CP, palpitations, cardiac awareness Sites have healed well. No SOB at rest or with ADLs No near syncope or syncope No bleeding or signs of bleeding  She has seen neuro ophthalmology at [facility], did not think any further testing needed, treatment would be to stay on her Xarelto  and that is what they recommended Mentioned they offered, or discussed MRI if needed, but did not think that would add to management  I ADVISED HER TODAY THAT SHE CAN NOT HAVE MRI's GOING FORWARD  She had a small retained stitch at the lateral edge of the  pocket that was trimmed off, otherwise well healed, L thoracotomy site well healed LV impedance is good, thresholds up from 0.75/0.5 post implant check to today is 2.5/0.5 (not unusual for endocardial lead for an acute rise in threshold), planned for early follow up for pocket and follow LV threshold to ensure good capture   Today's visit was scheduled as her 90 day post upgrade (June schedule) ROS:   She is accompanied by her husband. She is doing well, thinks a couple weeks ago she felt like she had really turned a corner, feels better energy, generally better. Home BPs all look pretty good, mostly 110's-110/50's (did have one day was lower) She remains off entresto /spironolactone .  She sees Dr. Laree later this month. Had not head back about resuming the meds, so has not.  No near syncope or syncope No CP Slight tenderness at the device site/incision   Device information [employer] dual chamber ICD implanted 05/26/2008, gen change 08/06/2015, upgrade to CRT w/epicardial LV lead  epicardial LV lead placed 09/28/23 -- [employer] bipolar screw-in lead     Studies Reviewed: SABRA    EKG done 09/29/23 and reviewed by myself:  SR, V paced QRS , PVCs  DEVICE interrogation (limited to f/u on LV lead measurements) done today and reviewed by myself Battery and auto lead lead measurements are good RA/RV lead are chronic LV impedance is good, (rechecked in unipolar was 250 Ohms) LV threshold  was 0.75/0.5 post implant  11/05/23 was  2.5/0.5 Today is 3.25/0.5 , 2.75/1.0 In d/w industry >> again, not unusual  Recommended check unipolar Unipolar threshold was 2.25/0.5 Changes recommended: LV lead changed to unipolar Output 3.25/0.5 Impedance alert reduced to 150 Ohms No arrhythmias  BP 97%  06/12/23: TTE 1. Left ventricular ejection fraction, by estimation, is 30 to 35%. The  left ventricle has moderately decreased function. The left ventricle  demonstrates global hypokinesis.  The left ventricular internal cavity size  was moderately dilated. Left  ventricular diastolic parameters are consistent with Grade I diastolic  dysfunction (impaired relaxation).   2. Right ventricular systolic function is normal. The right ventricular  size is normal. There is normal pulmonary artery systolic pressure. The  estimated right ventricular systolic pressure is 24.0 mmHg.   3. Left atrial size was mildly dilated.   4. The mitral valve is normal in structure. Moderate mitral valve  regurgitation. No evidence of mitral stenosis.   5. The aortic valve is tricuspid. Aortic valve regurgitation is not  visualized. No aortic stenosis is present.   6. Pulmonic valve regurgitation is moderate.   7. The inferior vena cava is dilated in size with >50% respiratory  variability, suggesting right atrial pressure of 8 mmHg.   Conclusion(s)/Recommendation(s): No intracardiac source of embolism  detected on this transthoracic study. Consider a transesophageal  echocardiogram to exclude cardiac source of embolism if clinically  indicated.    Risk Assessment/Calculations:    Physical Exam:   VS:  There were no vitals taken for this visit.   Wt Readings from Last 3 Encounters:  11/04/23 145 lb 12.8 oz (66.1 kg)  10/29/23 146 lb 12.8 oz (66.6 kg)  10/21/23 151 lb (68.5 kg)    GEN: Well nourished, well developed in no acute distress NECK: No JVD; No carotid bruits CARDIAC: RRR, no murmurs, rubs, gallops RESPIRATORY: CTA b/l without rales, wheezing or rhonchi  ABDOMEN: Soft, non-tender, non-distended EXTREMITIES: No edema; No deformity   ICD site: wound edges are well healed, no thinning, fluctuation, tethering TODAY note a ~1-46mm circular area of erythema at the MEDIAL corner/edge of the incision, I do not see a suture, no bleeding, drainage.  Lateral edge looks well/stable L thoracotomy site: well healed  ASSESSMENT AND PLAN: .    CRT-D intact function LV lead changes as  above  Site as described, (picture in media tab) no symptoms of illness.  Discussed with the patient that I would review with Dr. Cindie, he may recommend a course of abx to be proactive  In review with Dr. Cindie Keflex 500mg  BID x7 days Will have her back next week to evaluate the site Also discussed LV lead > agreed, not uncommon with the epicardial leads to have rise in thresholds Continue to monitor   NICM Chronic CHF LBBB LAMIN CM No symptoms or exam findings of volume OL corVue looks good She remains off Entresto /spiro, volume stable,  I will send my note to Dr. Lavona, make him aware, not certain her BP will allow resumption of both She sees him in a few weeks C/w Dr. Merilynn  Hx of LV thrombus Amaurosis fugax  On Xarelto , appropriately dosed    Dispo: Ill have her next week, sooner if needed  Signed, Charlies Macario Arthur, PA-C

## 2023-11-20 ENCOUNTER — Ambulatory Visit: Attending: Physician Assistant | Admitting: Physician Assistant

## 2023-11-20 ENCOUNTER — Encounter: Payer: Self-pay | Admitting: Physician Assistant

## 2023-11-20 VITALS — BP 94/54 | HR 64 | Ht 68.0 in | Wt 148.0 lb

## 2023-11-20 DIAGNOSIS — I5022 Chronic systolic (congestive) heart failure: Secondary | ICD-10-CM

## 2023-11-20 DIAGNOSIS — I447 Left bundle-branch block, unspecified: Secondary | ICD-10-CM | POA: Diagnosis not present

## 2023-11-20 DIAGNOSIS — I428 Other cardiomyopathies: Secondary | ICD-10-CM

## 2023-11-20 DIAGNOSIS — Z95810 Presence of automatic (implantable) cardiac defibrillator: Secondary | ICD-10-CM

## 2023-11-20 LAB — CUP PACEART INCLINIC DEVICE CHECK
Battery Remaining Longevity: 60 mo
Brady Statistic RA Percent Paced: 16 %
Brady Statistic RV Percent Paced: 97 %
Date Time Interrogation Session: 20251010130621
HighPow Impedance: 51.75 Ohm
Implantable Lead Connection Status: 753985
Implantable Lead Connection Status: 753985
Implantable Lead Implant Date: 20100416
Implantable Lead Implant Date: 20100416
Implantable Lead Location: 753859
Implantable Lead Location: 753860
Implantable Lead Model: 7122
Implantable Pulse Generator Implant Date: 20170626
Lead Channel Impedance Value: 250 Ohm
Lead Channel Impedance Value: 387.5 Ohm
Lead Channel Impedance Value: 450 Ohm
Lead Channel Pacing Threshold Amplitude: 1.25 V
Lead Channel Pacing Threshold Amplitude: 1.75 V
Lead Channel Pacing Threshold Amplitude: 2.25 V
Lead Channel Pacing Threshold Amplitude: 2.25 V
Lead Channel Pacing Threshold Pulse Width: 0.5 ms
Lead Channel Pacing Threshold Pulse Width: 0.5 ms
Lead Channel Pacing Threshold Pulse Width: 0.5 ms
Lead Channel Pacing Threshold Pulse Width: 0.5 ms
Lead Channel Sensing Intrinsic Amplitude: 0.9 mV
Lead Channel Sensing Intrinsic Amplitude: 12 mV
Lead Channel Setting Pacing Amplitude: 2 V
Lead Channel Setting Pacing Amplitude: 2.25 V
Lead Channel Setting Pacing Amplitude: 3.25 V
Lead Channel Setting Pacing Pulse Width: 0.5 ms
Lead Channel Setting Pacing Pulse Width: 0.5 ms
Lead Channel Setting Sensing Sensitivity: 0.5 mV
Pulse Gen Serial Number: 211052366

## 2023-11-20 MED ORDER — CEPHALEXIN 500 MG PO CAPS: 500.0000 mg | ORAL_CAPSULE | Freq: Two times a day (BID) | ORAL | 0 refills | Status: DC

## 2023-11-20 NOTE — Patient Instructions (Signed)
 Medication Instructions:   Your physician recommends that you continue on your current medications as directed. Please refer to the Current Medication list given to you today.  *If you need a refill on your cardiac medications before your next appointment, please call your pharmacy*   Lab Work: NONE ORDERED  TODAY    If you have labs (blood work) drawn today and your tests are completely normal, you will receive your results only by: MyChart Message (if you have MyChart) OR A paper copy in the mail If you have any lab test that is abnormal or we need to change your treatment, we will call you to review the results.  Testing/Procedures: NONE ORDERED  TODAY    Follow-Up: At [facility], you and your health needs are our priority.  As part of our continuing mission to provide you with exceptional heart care, our providers are all part of one team.  This team includes your primary Cardiologist (physician) and Advanced Practice Providers or APPs (Physician Assistants and Nurse Practitioners) who all work together to provide you with the care you need, when you need it.  Your next appointment:  Wednesday 10-15*25  with Renee  or device clinic  ( CONTACT  CASSIE HALL/ ANGELINE HAMMER FOR EP SCHEDULING ISSUES )   We recommend signing up for the patient portal called MyChart.  Sign up information is provided on this After Visit Summary.  MyChart is used to connect with patients for Virtual Visits (Telemedicine).  Patients are able to view lab/test results, encounter notes, upcoming appointments, etc.  Non-urgent messages can be sent to your provider as well.   To learn more about what you can do with MyChart, go to [email or web address].   Other Instructions

## 2023-11-22 NOTE — Progress Notes (Unsigned)
 Cardiology Office Note:  .   Date:  11/22/2023  ID:  [name] [name], DOB [birth year], MRN [id number] PCP: [name], MD  [facility] Providers Cardiologist:  Lynwood Schilling, MD Electrophysiologist:  Elspeth Sage, MD (inactive) > Dr. Cindie  History of Present Illness: .   [name] [name] is a 73 y.o. female w/PMHx of  NICM Hx of LV clot, episode of  amaurosis fugax > maintained on xarelto  LBBB LAMIN cardiomyopathy   Planned for ICD upgrade to CRT though had occluded left subclavian vein and axillary vein   08/04/23: occluded LUE subclavian and axillary vein systems   She had f/u with Dr. Cindie 08/26/23, discussed options, with some concerns R sided venous system may as well not be patent/fully patent, planned for epicardial LV lead implant.  09/28/23 > epicardial lead placed w/Dr. Lucas via left thoracotomy In the peri and post op period treated for UTI Post op AKI and lower BPs, was unable to resume all of her GDMT meds (held entresto  and spiro)  Discharged 10/01/23   CT suture removed 8/28 outpt Saw CTS service on 10/13/23, reported some drainage from CT site that was improving L pleural effusion  on CXR advised to take 5 days of lasix   Saw Dr. Lucas on 10/21/23, doing well, sites healing well, to f/u with him as needed Maintained off Entresto  with lower BPs  I saw her 11/04/23 She is accompanied by her husband. She is doing well No CP, palpitations, cardiac awareness Sites have healed well. No SOB at rest or with ADLs No near syncope or syncope No bleeding or signs of bleeding  She has seen neuro ophthalmology at [facility], did not think any further testing needed, treatment would be to stay on her Xarelto  and that is what they recommended Mentioned they offered, or discussed MRI if needed, but did not think that would add to management  I ADVISED HER TODAY THAT SHE CAN NOT HAVE MRI's GOING FORWARD  She had a small retained stitch at the lateral edge of  the pocket that was trimmed off, otherwise well healed, L thoracotomy site well healed LV impedance is good, thresholds up from 0.75/0.5 post implant check to today is 2.5/0.5 (not unusual for endocardial lead for an acute rise in threshold), planned for early follow up for pocket and follow LV threshold to ensure good capture  I saw her 11/19/13 She was doing well, had felt like she had mad a turn and starting to have better energy/overall better sense of wellbeing LV lead measurements Were 3.25/0.5 , 2.75/1.0 In d/w industry >> again, not unusual  Recommended check unipolar Unipolar threshold was 2.25/0.5 Changes recommended: LV lead changed to unipolar Output 3.25/0.5 Impedance alert reduced to 150 Ohms She had a small round area of erythema medal incision edge, not open or draining, no appreciated retained stitch In d/w dr. Cindie, in an abundance of caution > started on keflex   Today's visit was scheduled as her 90 day post upgrade (June schedule) ROS:   She started to notice yesterday after/during their drive back from [facility] a feeling like she could feel her heart beating in her abdomen.  Feels it pulsating Not painful. Otherwise no concerns Thinks site looks better  Device information [employer] dual chamber ICD implanted 05/26/2008, gen change 08/06/2015, upgrade to CRT w/epicardial LV lead  epicardial LV lead placed 09/28/23 -- [employer] bipolar screw-in lead     Studies Reviewed: SABRA    EKG not done today  DEVICE interrogation (limited to f/u on LV lead measurements) done today and reviewed by myself Battery and auto lead lead measurements are good RA/RV lead are chronic Visibly note diaphragmatic/chest wall stim LV threshold today unipolar 1.75/0.5 > programmed to 2.75V and 2.5 V with intermittent stim Bipolar 2.5/0.5 and 2.75/0.5 > 3.5/0.5 she felt like she could still feel it 1.75/1.0 > 2.5/1.0 final programming with no noted stim    06/12/23: TTE 1. Left  ventricular ejection fraction, by estimation, is 30 to 35%. The  left ventricle has moderately decreased function. The left ventricle  demonstrates global hypokinesis. The left ventricular internal cavity size  was moderately dilated. Left  ventricular diastolic parameters are consistent with Grade I diastolic  dysfunction (impaired relaxation).   2. Right ventricular systolic function is normal. The right ventricular  size is normal. There is normal pulmonary artery systolic pressure. The  estimated right ventricular systolic pressure is 24.0 mmHg.   3. Left atrial size was mildly dilated.   4. The mitral valve is normal in structure. Moderate mitral valve  regurgitation. No evidence of mitral stenosis.   5. The aortic valve is tricuspid. Aortic valve regurgitation is not  visualized. No aortic stenosis is present.   6. Pulmonic valve regurgitation is moderate.   7. The inferior vena cava is dilated in size with >50% respiratory  variability, suggesting right atrial pressure of 8 mmHg.   Conclusion(s)/Recommendation(s): No intracardiac source of embolism  detected on this transthoracic study. Consider a transesophageal  echocardiogram to exclude cardiac source of embolism if clinically  indicated.    Risk Assessment/Calculations:    Physical Exam:   VS:  There were no vitals taken for this visit.   Wt Readings from Last 3 Encounters:  11/20/23 148 lb (67.1 kg)  11/04/23 145 lb 12.8 oz (66.1 kg)  10/29/23 146 lb 12.8 oz (66.6 kg)    GEN: Well nourished, well developed in no acute distress NECK: No JVD; No carotid bruits CARDIAC:  RRR, no murmurs, rubs, gallops RESPIRATORY:  CTA b/l without rales, wheezing or rhonchi  ABDOMEN: Soft, non-tender, non-distended EXTREMITIES: trace edema (no lasix  taken yesterday in the cal much of the day); No deformity   ICD site: wound edges are well healed, no thinning, fluctuation, tethering Much improvement in medial edge area of prior  concern Minimal if any erythema remains. There is tiny dark dot, I again, do not appreciate retained stitch.   ASSESSMENT AND PLAN: .    CRT-D  intact function Programmed as above  LV thresholds seem to have plateaued/look a bit better   NICM Chronic CHF LBBB LAMIN CM No symptoms or exam findings of volume OL corVue looks good She remains off Entresto /spiro, volume stable She sees Dr. Lavona soon She will take her lasix  today once home Meds deferred to him C/w Dr. Merilynn  Hx of LV thrombus Amaurosis fugax  On Xarelto , appropriately dosed    Dispo: Ill have her back in 3-4 weeks recheck her LV lead, sooner if needed  Signed, Charlies Macario Arthur, PA-C

## 2023-11-23 ENCOUNTER — Ambulatory Visit: Payer: Self-pay | Admitting: Cardiology

## 2023-11-23 ENCOUNTER — Ambulatory Visit: Admitting: Cardiology

## 2023-11-24 ENCOUNTER — Encounter: Payer: Self-pay | Admitting: Physician Assistant

## 2023-11-24 ENCOUNTER — Ambulatory Visit: Attending: Physician Assistant | Admitting: Physician Assistant

## 2023-11-24 VITALS — BP 103/61 | HR 71 | Ht 68.0 in | Wt 151.6 lb

## 2023-11-24 DIAGNOSIS — I428 Other cardiomyopathies: Secondary | ICD-10-CM | POA: Diagnosis not present

## 2023-11-24 DIAGNOSIS — Z95810 Presence of automatic (implantable) cardiac defibrillator: Secondary | ICD-10-CM | POA: Diagnosis not present

## 2023-11-24 DIAGNOSIS — Z5189 Encounter for other specified aftercare: Secondary | ICD-10-CM

## 2023-11-24 NOTE — Patient Instructions (Signed)
 Medication Instructions:   Your physician recommends that you continue on your current medications as directed. Please refer to the Current Medication list given to you today.   *If you need a refill on your cardiac medications before your next appointment, please call your pharmacy*   Lab Work:  NONE ORDERED  TODAY     If you have labs (blood work) drawn today and your tests are completely normal, you will receive your results only by: MyChart Message (if you have MyChart) OR A paper copy in the mail If you have any lab test that is abnormal or we need to change your treatment, we will call you to review the results.    Testing/Procedures: NONE ORDERED  TODAY     Follow-Up: At [facility], you and your health needs are our priority.  As part of our continuing mission to provide you with exceptional heart care, our providers are all part of one team.  This team includes your primary Cardiologist (physician) and Advanced Practice Providers or APPs (Physician Assistants and Nurse Practitioners) who all work together to provide you with the care you need, when you need it.   Your next appointment:   PER [facility]   WEEK OF NOVEMBER  10 TH   Provider:   Charlies Arthur, PA-C      We recommend signing up for the patient portal called MyChart.  Sign up information is provided on this After Visit Summary.  MyChart is used to connect with patients for Virtual Visits (Telemedicine).  Patients are able to view lab/test results, encounter notes, upcoming appointments, etc.  Non-urgent messages can be sent to your provider as well.   To learn more about what you can do with MyChart, go to [email or web address].   Other Instructions

## 2023-11-25 ENCOUNTER — Encounter

## 2023-11-25 ENCOUNTER — Encounter (INDEPENDENT_AMBULATORY_CARE_PROVIDER_SITE_OTHER): Payer: Self-pay | Admitting: Gastroenterology

## 2023-11-25 LAB — CUP PACEART REMOTE DEVICE CHECK
Battery Remaining Longevity: 64 mo
Battery Remaining Percentage: 95 %
Battery Voltage: 3.01 V
Brady Statistic AP VP Percent: 24 %
Brady Statistic AP VS Percent: 1 %
Brady Statistic AS VP Percent: 70 %
Brady Statistic AS VS Percent: 1 %
Brady Statistic RA Percent Paced: 19 %
Date Time Interrogation Session: 20251014102126
HighPow Impedance: 51 Ohm
Lead Channel Impedance Value: 250 Ohm
Lead Channel Impedance Value: 410 Ohm
Lead Channel Impedance Value: 480 Ohm
Lead Channel Pacing Threshold Amplitude: 1 V
Lead Channel Pacing Threshold Amplitude: 1.25 V
Lead Channel Pacing Threshold Amplitude: 2.25 V
Lead Channel Pacing Threshold Pulse Width: 0.5 ms
Lead Channel Pacing Threshold Pulse Width: 0.5 ms
Lead Channel Pacing Threshold Pulse Width: 0.5 ms
Lead Channel Sensing Intrinsic Amplitude: 12 mV
Lead Channel Sensing Intrinsic Amplitude: 2 mV
Lead Channel Setting Pacing Amplitude: 2 V
Lead Channel Setting Pacing Amplitude: 2.25 V
Lead Channel Setting Pacing Amplitude: 3.25 V
Lead Channel Setting Pacing Pulse Width: 0.5 ms
Lead Channel Setting Pacing Pulse Width: 0.5 ms
Lead Channel Setting Sensing Sensitivity: 0.5 mV
Pulse Gen Serial Number: 211052366

## 2023-11-26 ENCOUNTER — Ambulatory Visit: Payer: Self-pay | Admitting: Cardiology

## 2023-11-26 NOTE — Progress Notes (Signed)
 Remote ICD Transmission

## 2023-12-05 ENCOUNTER — Other Ambulatory Visit: Payer: Self-pay | Admitting: Cardiology

## 2023-12-08 NOTE — Progress Notes (Unsigned)
 Cardiology Office Note:   Date:  12/09/2023  ID:  [name] [name], DOB [birth year], MRN [id number] PCP: [name], MD  [facility] Providers Cardiologist:  Lynwood Schilling, MD Electrophysiologist:  Elspeth Sage, MD (Inactive) {  History of Present Illness:   [name] [name] is a 73 y.o. female  who presents for follow up of cardiomyopathy.  She has a history of a nonischemic cardiomyopathy. Her ejection fraction was at one point in time 15 - 20%. A later ejection fraction was about 40% in 2015.     I sent her for a CPX in 2018 which demonstrated a good VO2 Max.  EF in 2023 was 35 - 40%. She has had an episode of amaurosis fugax.  Carotid doppler was unremarkable.  C reactive protein was upper limites of normal.  Sed rate was upper limits but down from previous.   She was on a lower dose of Xarelto  for previous LV clot.  I increased the dose after this .  I saw her after repeat echo that demonstrated the EF to be 30 - 35%.  I sent her to Dr. Cindie to talk about BiV upgrade.  I also referred her to neuro ophthalmology at [facility].      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  She is now status post epicardal lead placement.    She has post op AKI.  This was in August.  She was sent home off of Entresto  and spiro.     Although she had a tough time with surgery she is doing better.  She has not started to walk for exercise.  However, she is doing household chores.  The patient denies any new symptoms such as chest discomfort, neck or arm discomfort. There has been no new shortness of breath, PND or orthopnea. There have been no reported palpitations, presyncope or syncope.   ROS: As stated in the HPI and negative for all other systems.  Studies Reviewed:    EKG:     NA  Risk Assessment/Calculations:    Physical Exam:   VS:  BP (!) 90/56   Pulse 64   Ht 5' 7.5 (1.715 m)   Wt 148 lb (67.1 kg)   BMI  22.84 kg/m    Wt Readings from Last 3 Encounters:  12/09/23 148 lb (67.1 kg)  11/24/23 151 lb 9.6 oz (68.8 kg)  11/20/23 148 lb (67.1 kg)     GEN: Well nourished, well developed in no acute distress NECK: No JVD; No carotid bruits CARDIAC: RRR, no murmurs, rubs, gallops RESPIRATORY:  Clear to auscultation without rales, wheezing or rhonchi  ABDOMEN: Soft, non-tender, non-distended EXTREMITIES:  No edema; No deformity   ASSESSMENT AND PLAN:   CARDIOMYOPATHY, PRIMARY, DILATED:     She is doing symptomatically better than she was.  Her blood pressure will not allow med titration at this point but she will call me if she starts seeing systolics above 100 at which point I would probably restart the Entresto .  She can continue the meds as listed for now.   ICD :   She is now status post up grade.     LV CLOT:   Continue Xarelto  indefinitely she is on target dose Xarelto  and has had no new findings on the echo.    AMAOURSIS FUGAX: There has been no clear etiology.   Carotid has been unremarkable.  Inflammatory markers have been nondiagnostic.  Echo demonstrated no new source of clot.   She saw [facility] neuro-ophthalmology with no new suggestions.    Follow up with me in 3 months.   Signed, Lynwood Schilling, MD

## 2023-12-09 ENCOUNTER — Ambulatory Visit: Attending: Cardiology | Admitting: Cardiology

## 2023-12-09 ENCOUNTER — Encounter: Payer: Self-pay | Admitting: Cardiology

## 2023-12-09 VITALS — BP 90/56 | HR 64 | Ht 67.5 in | Wt 148.0 lb

## 2023-12-09 DIAGNOSIS — Z95810 Presence of automatic (implantable) cardiac defibrillator: Secondary | ICD-10-CM | POA: Diagnosis not present

## 2023-12-09 DIAGNOSIS — G453 Amaurosis fugax: Secondary | ICD-10-CM | POA: Diagnosis not present

## 2023-12-09 DIAGNOSIS — I428 Other cardiomyopathies: Secondary | ICD-10-CM

## 2023-12-09 NOTE — Patient Instructions (Signed)
 Medication Instructions:  Your physician recommends that you continue on your current medications as directed. Please refer to the Current Medication list given to you today.  *If you need a refill on your cardiac medications before your next appointment, please call your pharmacy*  Lab Work: NONE If you have labs (blood work) drawn today and your tests are completely normal, you will receive your results only by: MyChart Message (if you have MyChart) OR A paper copy in the mail If you have any lab test that is abnormal or we need to change your treatment, we will call you to review the results.  Testing/Procedures: NONE  Follow-Up: At [facility], you and your health needs are our priority.  As part of our continuing mission to provide you with exceptional heart care, our providers are all part of one team.  This team includes your primary Cardiologist (physician) and Advanced Practice Providers or APPs (Physician Assistants and Nurse Practitioners) who all work together to provide you with the care you need, when you need it.  Your next appointment:   3 month(s)  Provider:   Eilleen Grates, MD    We recommend signing up for the patient portal called "MyChart".  Sign up information is provided on this After Visit Summary.  MyChart is used to connect with patients for Virtual Visits (Telemedicine).  Patients are able to view lab/test results, encounter notes, upcoming appointments, etc.  Non-urgent messages can be sent to your provider as well.   To learn more about what you can do with MyChart, go to [email or web address].

## 2023-12-15 ENCOUNTER — Telehealth: Payer: Self-pay | Admitting: Cardiology

## 2023-12-15 DIAGNOSIS — I6529 Occlusion and stenosis of unspecified carotid artery: Secondary | ICD-10-CM

## 2023-12-15 NOTE — Telephone Encounter (Signed)
 Pt came in today has had another episode in right eye. Pt saw an optomologist & they said to make sure you are aware she's still having episodes. They stated it's a sign of a blockage in the carotid. It's not a problem with her eyes but a blockage elsewhere. Please contact pt asap to let her know what to do.

## 2023-12-16 NOTE — Telephone Encounter (Signed)
 Spoke with pt regarding a referral. Pt agreeable to plane. Referral placed STAT. Pt verbalized understanding. All questions if any were answered.

## 2023-12-21 ENCOUNTER — Other Ambulatory Visit: Payer: Self-pay | Admitting: Vascular Surgery

## 2023-12-21 DIAGNOSIS — I6529 Occlusion and stenosis of unspecified carotid artery: Secondary | ICD-10-CM

## 2023-12-22 ENCOUNTER — Encounter: Payer: Self-pay | Admitting: Vascular Surgery

## 2023-12-22 ENCOUNTER — Ambulatory Visit: Attending: Vascular Surgery | Admitting: Vascular Surgery

## 2023-12-22 ENCOUNTER — Ambulatory Visit (HOSPITAL_COMMUNITY)
Admission: RE | Admit: 2023-12-22 | Discharge: 2023-12-22 | Disposition: A | Discharge status: Home / Self Care | Source: Ambulatory Visit | Attending: Cardiovascular Disease | Admitting: Cardiovascular Disease

## 2023-12-22 VITALS — BP 99/55 | HR 67 | Temp 97.9°F | Ht 67.5 in | Wt 146.0 lb

## 2023-12-22 DIAGNOSIS — I6529 Occlusion and stenosis of unspecified carotid artery: Secondary | ICD-10-CM | POA: Diagnosis not present

## 2023-12-22 DIAGNOSIS — I6523 Occlusion and stenosis of bilateral carotid arteries: Secondary | ICD-10-CM | POA: Diagnosis not present

## 2023-12-22 NOTE — Progress Notes (Signed)
 [facility] [place]  ASSESSMENT / PLAN: [name] [name] is a 73 y.o. female with asymptomatic mild carotid artery atherosclerosis.   Recommend:  Abstinence from all tobacco products. Blood glucose control with goal A1c < 7%. Blood pressure control with goal blood pressure < 130/80 mmHg. Lipid reduction therapy with goal LDL-C < 55 mg/dL. Aspirin 81mg  by mouth daily. Atorvastatin  40-80mg  PO QD (or other high intensity statin therapy).  No role for surgical therapy for minimal carotid atherosclerosis.  I counseled the patient that this was not likely the cause of her amaurosis fugax.  She should continue her follow-up with ophthalmology and neuro-ophthalmology.  She can follow-up with us  in 3 years for surveillance with repeat carotid artery duplex.  CHIEF COMPLAINT: Amaurosis  HISTORY OF PRESENT ILLNESS: [name] [name] is a 73 y.o. female referred to clinic for evaluation of amaurosis fugax and possible carotid artery stenosis.  The patient has a fairly classic history of amaurosis fugax describing rapid vision loss.  She is seeing a ophthalmologist and a neuro-ophthalmologist without any clear cause.  She is referred today to evaluate for possible cerebrovascular cause.  We reviewed her duplex in detail.  Past Medical History:  Diagnosis Date   AICD (automatic cardioverter/defibrillator) present    Arthritis    thumbs   Breast cancer (HCC)    Right 1994 and Left 2023   Cerebrovascular disease    s/p right MCA cardioemoblic infarct 8/71/89   CHF (congestive heart failure) (HCC)    Chronic kidney disease    History of kidney stones    Nonischemic cardiomyopathy (HCC)    a. +LMNA mutation b. s/p STJ dual chamber ICD   Pneumonia    Pre-diabetes    Presence of permanent cardiac pacemaker    Stroke (HCC) 02/2008   no deficits   Stroke (HCC)    05/27/23 right eye and Left eye 06/2023 strokes no deficits    Past Surgical History:  Procedure Laterality  Date   ABDOMINAL HYSTERECTOMY     BIOPSY  09/09/2022   Procedure: BIOPSY;  Surgeon: Eartha Angelia Sieving, MD;  [facility];  Service: Gastroenterology;;   JUVENTINO LLANO N/A 08/04/2023   Procedure: BIV LLANO;  Surgeon: Cindie Ole DASEN, MD;  Location: [facility] INVASIVE CV LAB;  Service: Cardiovascular;  Laterality: N/A;   BREAST IMPLANT REMOVAL Right 05/31/2015   Procedure: REMOVAL RIGHT  BREAST IMPLANTS;  Surgeon: Alm Sick, MD;  Location[facility] OR;  Service: Plastics;  Laterality: Right;   BREAST RECONSTRUCTION Right 1995   BREAST RECONSTRUCTION Right 05/31/2015   Procedure: RIGHT BREAST RECONSTRUCTION WITH SALINE IMPLANTS ;  Surgeon: Alm Sick, MD;  Location[facility] OR;  Service: Plastics;  Laterality: Right;   CARDIAC DEFIBRILLATOR PLACEMENT  2010   STJ dual chamber ICD implanted for NICM by Dr Fernande   CARDIAC DEFIBRILLATOR PLACEMENT  05/26/2008   CATARACT EXTRACTION W/PHACO Right 03/19/2018   Procedure: CATARACT EXTRACTION PHACO AND INTRAOCULAR LENS PLACEMENT (IOC);  Surgeon: Harrie Agent, MD;  Location: [facility] ORS;  Service: Ophthalmology;  Laterality: Right;  CDE: 5.42   CATARACT EXTRACTION W/PHACO Left 10/01/2018   Procedure: CATARACT EXTRACTION PHACO AND INTRAOCULAR LENS PLACEMENT (IOC);  Surgeon: Harrie Agent, MD;  Location: [facility] ORS;  Service: Ophthalmology;  Laterality: Left;  left, CDE: 6.06   CHOLECYSTECTOMY     COLONOSCOPY     COLONOSCOPY WITH PROPOFOL  N/A 09/09/2022   Procedure: COLONOSCOPY WITH PROPOFOL ;  Surgeon: Eartha Angelia Sieving, MD;  [facility]: [facility];  Service: Gastroenterology;  Laterality: N/A;  8:30am;asa 3, moved to 2:15 per Tanya   EP IMPLANTABLE DEVICE N/A 08/06/2015   Procedure: ICD Generator Changeout;  Surgeon: Elspeth JAYSON Sage, MD;  Location: [facility] INVASIVE CV LAB;  Service: Cardiovascular;  Laterality: N/A;   EPICARDIAL PACING LEAD PLACEMENT Left 09/28/2023   Procedure: INSERTION, EPICARDIAL ELECTRODE LEAD;  Surgeon: Lucas Dorise POUR, MD;  Location: [facility]  OR;  Service: Thoracic;  Laterality: Left;   ESOPHAGOGASTRODUODENOSCOPY     LEAD INSERTION N/A 08/04/2023   Procedure: LEAD INSERTION;  Surgeon: Cindie Ole DASEN, MD;  [facility]: [facility] INVAS[facility] CV LAB;  Service: Cardiovascular;  Laterality: N/A;   MASTECTOMY Right    MASTECTOMY W/ SENTINEL NODE BIOPSY Left 04/05/2021   Procedure: LEFT MASTECTOMY;  Surgeon: Vernetta Berg, MD;  Location: [facility] OR;  Service: General;  Laterality: Left;   MASTOPEXY Left 05/31/2015   Procedure: LEFT BREAST MASTOPEXY;  Surgeon: Alm Sick, MD;  Location: [facility] OR;  Service: Plastics;  Laterality: Left;   PACEMAKER INSERTION  05/2008   POLYPECTOMY  09/09/2022   Procedure: POLYPECTOMY;  Surgeon: Eartha Flavors, Toribio, MD;  [facility];  Service: Gastroenterology;;   SENTINEL NODE BIOPSY Left 04/05/2021   Procedure: SENTINEL LYMPH NODE BIOPSY;  Surgeon: Vernetta Berg, MD;  Location: [facility] OR;  Service: General;  Laterality: Left;   SUBMUCOSAL INJECTION  09/09/2022   Procedure: SUBMUCOSAL INJECTION;  Surgeon: Eartha Flavors Toribio, MD;  [facility]: [facility];  Service: Gastroenterology;;   ROBLEY MEYER INJECTION  09/09/2022   Procedure: SUBMUCOSAL LIFTING INJECTION;  Surgeon: Eartha Flavors Toribio, MD;  [facility];  Service: Gastroenterology;;   THORACOTOMY Left 09/28/2023   Procedure: BRANTLEY CATER;  Surgeon: Lucas Dorise POUR, MD;  Location: [facility] OR;  Service: Thoracic;  Laterality: Left;   TONSILLECTOMY     VARICOSE VEIN SURGERY Left    leg    Family History  Problem Relation Age of Onset   Cardiomyopathy Other        family hx of   Other Brother        s/p heart transplant   Stroke Other        family hx of    Social History   Socioeconomic History   Marital status: Married    Spouse name: Not on file   Number of children: Not on file   Years of education: Not on file   Highest education level: Not on file  Occupational History   Not on file  Tobacco Use    Smoking status: Never   Smokeless tobacco: Never  Vaping Use   Vaping status: Never Used  Substance and Sexual Activity   Alcohol use: No   Drug use: No   Sexual activity: Yes    Birth control/protection: None  Other Topics Concern   Not on file  Social History Narrative   Not on file   Social Drivers of Health   Financial Resource Strain: Low Risk (03/04/2021)   Received from [facility]   Overall Financial Resource Strain (CARDIA)    Difficulty of Paying Living Expenses: Not hard at all  Food Insecurity: No Food Insecurity (10/29/2023)   Hunger Vital Sign    Worried About Running Out of Food in the Last Year: Never true    Ran Out of Food in the Last Year: Never true  Transportation Needs: No Transportation Needs (10/29/2023)   PRAPARE - Transportation    Lack of Transportation (Medical): No    Lack of  Transportation (Non-Medical): No  Physical Activity: Insufficiently Active (03/04/2021)   Received from [facility]   Exercise Vital Sign    On average, how many days per week do you engage in moderate to strenuous exercise (like a brisk walk)?: 3 days    On average, how many minutes do you engage in exercise at this level?: 30 min  Stress: No Stress Concern Present (03/04/2021)   Received from [facility] - Occupational Stress Questionnaire    Feeling of Stress : Not at all  Social Connections: Moderately Isolated (10/29/2023)   Social Connection and Isolation Panel    Frequency of Communication with Friends and Family: More than three times a week    Frequency of Social Gatherings with Friends and Family: Three times a week    Attends Religious Services: Patient declined    Active Member of Clubs or Organizations: No    Attends Banker Meetings: Never    Marital Status: Married  Catering Manager Violence: Not At Risk (10/29/2023)   Humiliation, Afraid, Rape, and Kick questionnaire    Fear of Current or  Ex-Partner: No    Emotionally Abused: No    Physically Abused: No    Sexually Abused: No    No Known Allergies  Current Outpatient Medications  Medication Sig Dispense Refill   ACCU-CHEK GUIDE TEST test strip daily.     Accu-Chek Softclix Lancets lancets daily.     anastrozole  (ARIMIDEX ) 1 MG tablet Take 1 mg by mouth in the morning.     atorvastatin  (LIPITOR) 20 MG tablet Take 1 tablet (20 mg total) by mouth daily. 30 tablet 2   cholecalciferol  (VITAMIN D3) 25 MCG (1000 UNIT) tablet Take 1,000 Units by mouth daily.     Collagen-Vitamin C-Biotin (COLLAGEN PO) Take 1 Scoop by mouth daily.     digoxin  (LANOXIN ) 0.125 MG tablet TAKE 1/2 TABLET BY MOUTH DAILY 45 tablet 3   empagliflozin  (JARDIANCE ) 10 MG TABS tablet TAKE 1 TABLET BY MOUTH DAILY BEFORE BREAKFAST 90 tablet 0   furosemide  (LASIX ) 40 MG tablet Take 40-60 mg by mouth See admin instructions. Take 40 mg by mouth every other day, alternating with 60 mg on alternate days.     metoprolol  succinate (TOPROL  XL) 25 MG 24 hr tablet Take 1 tablet (25 mg total) by mouth in the morning AND 2 tablets (50 mg total) at bedtime. 270 tablet 3   Multiple Vitamin (MULTIVITAMIN WITH MINERALS) TABS tablet Take 1 tablet by mouth in the morning.     multivitamin-lutein (OCUVITE-LUTEIN) CAPS capsule Take 1 capsule by mouth daily.     rivaroxaban  (XARELTO ) 20 MG TABS tablet Take 1 tablet (20 mg total) by mouth daily with supper. 90 tablet 3   No current facility-administered medications for this visit.    PHYSICAL EXAM Vitals:   12/22/23 0920 12/22/23 0921  BP: 96/63 (!) 99/55  Pulse: 67   Temp: 97.9 F (36.6 C)   SpO2: 97%   Weight: 146 lb (66.2 kg)   Height: 5' 7.5 (1.715 m)    Well-appearing elderly woman in no distress Regular rate and rhythm Unlabored breathing Moves all extremities spontaneously No difficulty with gait or station  PERTINENT LABORATORY AND RADIOLOGIC DATA  Most recent CBC    Latest Ref Rng & Units 09/30/2023     2:37 AM 09/29/2023    2:36 AM 09/24/2023   10:40 AM  CBC  WBC 4.0 - 10.5 K/uL 11.3  17.6  7.8   Hemoglobin 12.0 - 15.0 g/dL 89.8  88.7  87.4   Hematocrit 36.0 - 46.0 % 30.6  34.5  38.4   Platelets 150 - 400 K/uL 80  114  112      Most recent CMP    Latest Ref Rng & Units 10/01/2023    3:40 AM 09/30/2023    2:37 AM 09/29/2023    2:36 AM  CMP  Glucose 70 - 99 mg/dL 867  858  567   BUN 8 - 23 mg/dL 32  39  27   Creatinine 0.44 - 1.00 mg/dL 8.66  7.89  8.41   Sodium 135 - 145 mmol/L 139  134  130   Potassium 3.5 - 5.1 mmol/L 3.4  3.6  4.4   Chloride 98 - 111 mmol/L 105  103  101   CO2 22 - 32 mmol/L 21  20  19    Calcium  8.9 - 10.3 mg/dL 8.9  8.6  7.9     Renal function CrCl cannot be calculated (Patient's most recent lab result is older than the maximum 21 days allowed.).  Hgb A1c MFr Bld (%)  Date Value  03/09/2008    6.0 (NOTE)   The ADA recommends the following therapeutic goal for glycemic   control related to Hgb A1C measurement:   Goal of Therapy:   < 7.0% Hgb A1C   Reference: American Diabetes Association: Clinical Practice   Recommendations 2008, Diabetes Care,  2008, 31:(Suppl 1).    LDL Cholesterol  Date Value Ref Range Status  03/10/2008  0 - 99 mg/dL Final   82        Total Cholesterol/HDL:CHD Risk Coronary Heart Disease Risk Table                     Men   Women  1/2 Average Risk   3.4   3.3  Average Risk       5.0   4.4  2 X Average Risk   9.6   7.1  3 X Average Risk  23.4   11.0        Use the calculated Patient Ratio above and the CHD Risk Table to determine the patient's CHD Risk.        ATP III CLASSIFICATION (LDL):  <100     mg/dL   Optimal  899-870  mg/dL   Near or Above                    Optimal  130-159  mg/dL   Borderline  839-810  mg/dL   High  >809     mg/dL   Very High    Carotid Arterial Duplex Study  Patient Name:  [name] [name]  Date of Exam:   12/22/2023 Medical Rec #: [id number]          Accession #:    [id number] Date of  Birth: Jun 05, 1950          Patient Gender: F Patient Age:   43 years Exam Location:  [address] Procedure:      VAS US  CAROTID Referring Phys: DEBBY ROBERTSON   --------------------------------------------------------------------------- -----   Indications:       Carotid artery disease. Risk Factors:      Prior CVA. Comparison Study:  06/04/23  Performing Technologist: Garnette Rockers    Examination Guidelines: A complete evaluation includes B-mode imaging, spectral Doppler, color Doppler, and power Doppler as needed of all accessible portions  of each vessel. Bilateral testing is considered an integral part of a complete examination. Limited examinations for reoccurring indications may be performed as noted.    Right Carotid Findings: +----------+--------+--------+--------+------------------+----------------- -+           PSV cm/sEDV cm/sStenosisPlaque DescriptionComments           +----------+--------+--------+--------+------------------+----------------- -+ CCA Prox  82      13                                                   +----------+--------+--------+--------+------------------+----------------- -+ CCA Distal53      16                                intimal thickening +----------+--------+--------+--------+------------------+----------------- -+ ICA Prox  49      17                                intimal thickening +----------+--------+--------+--------+------------------+----------------- -+ ICA Distal69      25                                                   +----------+--------+--------+--------+------------------+----------------- -+ ECA       48      7                                                    +----------+--------+--------+--------+------------------+----------------- -+  +----------+--------+-------+--------+-------------------+           PSV cm/sEDV cmsDescribeArm Pressure  (mmHG) +----------+--------+-------+--------+-------------------+ [id number]                                         +----------+--------+-------+--------+-------------------+  +---------+--------+--+--------+--+ VertebralPSV cm/s49EDV cm/s16 +---------+--------+--+--------+--+     Left Carotid Findings: +----------+--------+--------+--------+---------------------+-------------- ----+           PSV cm/sEDV cm/sStenosisPlaque Description   Comments            +----------+--------+--------+--------+---------------------+-------------- ----+ CCA Prox  67      16                                                       +----------+--------+--------+--------+---------------------+-------------- ----+ CCA Distal59      15                                   intimal thickening +----------+--------+--------+--------+---------------------+-------------- ----+ ICA Prox  67      23      1-39%   hyperechoic and  smooth                                   +----------+--------+--------+--------+---------------------+-------------- ----+ ICA Distal83      30                                                       +----------+--------+--------+--------+---------------------+-------------- ----+ ECA       45      7                                                        +----------+--------+--------+--------+---------------------+-------------- ----+  +----------+--------+--------+--------+-------------------+           PSV cm/sEDV cm/sDescribeArm Pressure (mmHG) +----------+--------+--------+--------+-------------------+ Subclavian101                                         +----------+--------+--------+--------+-------------------+  +---------+--------+--+--------+--+ VertebralPSV cm/s43EDV cm/s13 +---------+--------+--+--------+--+         Summary: Right Carotid: The extracranial vessels were near-normal with only minimal wall                thickening or plaque.  Left Carotid: Velocities in the left ICA are consistent with a 1-39% stenosis.  Vertebrals:  Bilateral vertebral arteries demonstrate antegrade flow. Subclavians: Normal flow hemodynamics were seen in bilateral subclavian              arteries.    Debby SAILOR. Magda, MD FACS [facility] Phone Number: [phone] 12/22/2023 9:36 AM   Total time spent on preparing this encounter including chart review, data review, collecting history, examining the patient, and coordinating care: 45 min  Portions of this report may have been transcribed using voice recognition software.  Every effort has been made to ensure accuracy; however, inadvertent computerized transcription errors may still be present.

## 2023-12-23 NOTE — Progress Notes (Unsigned)
 Cardiology Office Note:  .   Date:  12/23/2023  ID:  [name] [name], DOB 29-Mar-1950, MRN [id number] PCP: [name], MD  [facility] Providers Cardiologist:  Lynwood Schilling, MD Electrophysiologist:  Elspeth Sage, MD (inactive) > Dr. Cindie  History of Present Illness: .   [name] [name] is a 73 y.o. female w/PMHx of  NICM Hx of LV clot, episode of  amaurosis fugax > maintained on xarelto  LBBB LAMIN cardiomyopathy   Planned for ICD upgrade to CRT though had occluded left subclavian vein and axillary vein   08/04/23: occluded LUE subclavian and axillary vein systems   She had f/u with Dr. Cindie 08/26/23, discussed options, with some concerns R sided venous system may as well not be patent/fully patent, planned for epicardial LV lead implant.  09/28/23 > epicardial lead placed w/Dr. Lucas via left thoracotomy In the peri and post op period treated for UTI Post op AKI and lower BPs, was unable to resume all of her GDMT meds (held entresto  and spiro)  Discharged 10/01/23   CT suture removed 8/28 outpt Saw CTS service on 10/13/23, reported some drainage from CT site that was improving L pleural effusion  on CXR advised to take 5 days of lasix   Saw Dr. Lucas on 10/21/23, doing well, sites healing well, to f/u with him as needed Maintained off Entresto  with lower BPs  I saw her 11/04/23 She is accompanied by her husband. She is doing well No CP, palpitations, cardiac awareness Sites have healed well. No SOB at rest or with ADLs No near syncope or syncope No bleeding or signs of bleeding  She has seen neuro ophthalmology at [facility], did not think any further testing needed, treatment would be to stay on her Xarelto  and that is what they recommended Mentioned they offered, or discussed MRI if needed, but did not think that would add to management  I ADVISED HER TODAY THAT SHE CAN NOT HAVE MRI's GOING FORWARD  She had a small retained stitch at the lateral edge of  the pocket that was trimmed off, otherwise well healed, L thoracotomy site well healed LV impedance is good, thresholds up from 0.75/0.5 post implant check to today is 2.5/0.5 (not unusual for endocardial lead for an acute rise in threshold), planned for early follow up for pocket and follow LV threshold to ensure good capture  I saw her 11/19/13 She was doing well, had felt like she had mad a turn and starting to have better energy/overall better sense of wellbeing LV lead measurements Were 3.25/0.5 , 2.75/1.0 In d/w industry >> again, not unusual  Recommended check unipolar Unipolar threshold was 2.25/0.5 Changes recommended: LV lead changed to unipolar Output 3.25/0.5 Impedance alert reduced to 150 Ohms She had a small round area of erythema medal incision edge, not open or draining, no appreciated retained stitch In d/w Dr. Cindie, in an abundance of caution > started on keflex  I saw her back on 11/24/23 She started to notice yesterday after/during their drive back from [facility] a feeling like she could feel her heart beating in her abdomen.  Feels it pulsating Not painful. Otherwise no concerns Thinks site looks better + diaphragmatic/phrenic stim noted LV threshold today unipolar 1.75/0.5 > programmed to 2.75V and 2.5 V with intermittent stim Bipolar 2.5/0.5 and 2.75/0.5 > 3.5/0.5 she felt like she could still feel it 1.75/1.0 > 2.5/1.0 final programming with no noted stim   She saw Dr. Schilling 12/09/23 Slow recovery post epicardial lead >  not walking again yetm, but doing more house chores BP would not tolerate medication titration Planned 3 mo visit  Today's visit was scheduled planned f/u on LV lead  ROS:   She is accompanied by her husband She reports having recurrent R eye transient vision loss, communicated with Dr. Lavona, and her eye MD > saw Dr. Magda > no significant carotid disease found. She did not reach out to the [facility] team > I suggested that she  does  Otherwise, doing quite well No CP, palpitations Once transient feeling of the soft thumping in her abdomen, this otherwise resolved after her last visit  Energy continues to slowly improve No rest SOB, less DOE   Device information [employer] dual chamber ICD implanted 05/26/2008, gen change 08/06/2015, upgrade to CRT w/epicardial LV lead  epicardial LV lead placed 09/28/23 -- [employer] bipolar screw-in lead     Studies Reviewed: SABRA    EKG not done today  DEVICE interrogation (limited to f/u on LV lead measurements) done today and reviewed by myself Battery and auto lead lead measurements are good RA/RV lead are chronic LV threshold today is unchanged 1.75/1.0  LV lead impedance alert was returned to 200 Ohms, given back to bipolar program No other changes made   06/12/23: TTE 1. Left ventricular ejection fraction, by estimation, is 30 to 35%. The  left ventricle has moderately decreased function. The left ventricle  demonstrates global hypokinesis. The left ventricular internal cavity size  was moderately dilated. Left  ventricular diastolic parameters are consistent with Grade I diastolic  dysfunction (impaired relaxation).   2. Right ventricular systolic function is normal. The right ventricular  size is normal. There is normal pulmonary artery systolic pressure. The  estimated right ventricular systolic pressure is 24.0 mmHg.   3. Left atrial size was mildly dilated.   4. The mitral valve is normal in structure. Moderate mitral valve  regurgitation. No evidence of mitral stenosis.   5. The aortic valve is tricuspid. Aortic valve regurgitation is not  visualized. No aortic stenosis is present.   6. Pulmonic valve regurgitation is moderate.   7. The inferior vena cava is dilated in size with >50% respiratory  variability, suggesting right atrial pressure of 8 mmHg.   Conclusion(s)/Recommendation(s): No intracardiac source of embolism  detected on this  transthoracic study. Consider a transesophageal  echocardiogram to exclude cardiac source of embolism if clinically  indicated.    Risk Assessment/Calculations:    Physical Exam:   VS:  There were no vitals taken for this visit.   Wt Readings from Last 3 Encounters:  12/22/23 146 lb (66.2 kg)  12/09/23 148 lb (67.1 kg)  11/24/23 151 lb 9.6 oz (68.8 kg)    GEN: Well nourished, well developed in no acute distress NECK: No JVD; No carotid bruits CARDIAC: RRR, no murmurs, rubs, gallops RESPIRATORY:  CTA b/l without rales, wheezing or rhonchi  ABDOMEN: Soft, non-tender, non-distended EXTREMITIES: no edema; No deformity   ICD site: wound edges are well healed, no thinning, fluctuation, tethering Well healed   ASSESSMENT AND PLAN: .    CRT-D  intact function Programmed as above LV thresholds seems to have stabaized   NICM Chronic CHF LBBB LAMIN CM No symptoms or exam findings of volume OL corVue looks good BP 95% C/w Dr. Merilynn  Her last labs noted mild hypokalemia > will recheck  Hx of LV thrombus Amaurosis fugax  On Xarelto , appropriately dosed    Dispo: remotes as usual, in clinic with  EP in 6 mo, sooner if needed  Signed, Charlies Macario Arthur, PA-C

## 2023-12-25 ENCOUNTER — Encounter: Payer: Self-pay | Admitting: Physician Assistant

## 2023-12-25 ENCOUNTER — Ambulatory Visit: Attending: Physician Assistant | Admitting: Physician Assistant

## 2023-12-25 VITALS — BP 100/64 | HR 66 | Ht 68.0 in | Wt 150.0 lb

## 2023-12-25 DIAGNOSIS — Z95810 Presence of automatic (implantable) cardiac defibrillator: Secondary | ICD-10-CM

## 2023-12-25 DIAGNOSIS — I513 Intracardiac thrombosis, not elsewhere classified: Secondary | ICD-10-CM

## 2023-12-25 DIAGNOSIS — Z79899 Other long term (current) drug therapy: Secondary | ICD-10-CM | POA: Diagnosis not present

## 2023-12-25 DIAGNOSIS — I428 Other cardiomyopathies: Secondary | ICD-10-CM | POA: Diagnosis not present

## 2023-12-25 LAB — CUP PACEART INCLINIC DEVICE CHECK
Battery Remaining Longevity: 69 mo
Brady Statistic RA Percent Paced: 24 %
Brady Statistic RV Percent Paced: 95 %
Date Time Interrogation Session: 20251114131718
HighPow Impedance: 56.25 Ohm
Implantable Lead Connection Status: 753985
Implantable Lead Connection Status: 753985
Implantable Lead Implant Date: 20100416
Implantable Lead Implant Date: 20100416
Implantable Lead Location: 753859
Implantable Lead Location: 753860
Implantable Lead Model: 7122
Implantable Pulse Generator Implant Date: 20170626
Lead Channel Impedance Value: 250 Ohm
Lead Channel Impedance Value: 412.5 Ohm
Lead Channel Impedance Value: 450 Ohm
Lead Channel Pacing Threshold Amplitude: 1.375 V
Lead Channel Pacing Threshold Amplitude: 1.75 V
Lead Channel Pacing Threshold Amplitude: 1.75 V
Lead Channel Pacing Threshold Amplitude: 1.75 V
Lead Channel Pacing Threshold Pulse Width: 0.5 ms
Lead Channel Pacing Threshold Pulse Width: 0.5 ms
Lead Channel Pacing Threshold Pulse Width: 1 ms
Lead Channel Pacing Threshold Pulse Width: 1 ms
Lead Channel Sensing Intrinsic Amplitude: 0.4 mV
Lead Channel Sensing Intrinsic Amplitude: 12 mV
Lead Channel Setting Pacing Amplitude: 2 V
Lead Channel Setting Pacing Amplitude: 2.375
Lead Channel Setting Pacing Amplitude: 2.5 V
Lead Channel Setting Pacing Pulse Width: 0.5 ms
Lead Channel Setting Pacing Pulse Width: 1 ms
Lead Channel Setting Sensing Sensitivity: 0.5 mV
Pulse Gen Serial Number: 211052366

## 2023-12-25 NOTE — Patient Instructions (Addendum)
 Medication Instructions:   Your physician recommends that you continue on your current medications as directed. Please refer to the Current Medication list given to you today.   *If you need a refill on your cardiac medications before your next appointment, please call your pharmacy*    Lab Work:   PLEASE GO DOWN STAIRS  [facility]  FIRST FLOOR   ( GET OFF ELEVATORS WALK TOWARDS WAITING AREA LAB LOCATED BY PHARMACY):  BMET TODAY         If you have labs (blood work) drawn today and your tests are completely normal, you will receive your results only by: MyChart Message (if you have MyChart) OR A paper copy in the mail If you have any lab test that is abnormal or we need to change your treatment, we will call you to review the results.    Testing/Procedures: NONE ORDERED  TODAY      Follow-Up: At [facility], you and your health needs are our priority.  As part of our continuing mission to provide you with exceptional heart care, our providers are all part of one team.  This team includes your primary Cardiologist (physician) and Advanced Practice Providers or APPs (Physician Assistants and Nurse Practitioners) who all work together to provide you with the care you need, when you need it.  Your next appointment:    6 month(s)   Provider:    Charlies Arthur, PA-C    We recommend signing up for the patient portal called MyChart.  Sign up information is provided on this After Visit Summary.  MyChart is used to connect with patients for Virtual Visits (Telemedicine).  Patients are able to view lab/test results, encounter notes, upcoming appointments, etc.  Non-urgent messages can be sent to your provider as well.   To learn more about what you can do with MyChart, go to [email or web address].   Other Instructions

## 2023-12-26 LAB — BASIC METABOLIC PANEL WITH GFR
BUN/Creatinine Ratio: 24 (ref 12–28)
BUN: 24 mg/dL (ref 8–27)
CO2: 27 mmol/L (ref 20–29)
Calcium: 9.7 mg/dL (ref 8.7–10.3)
Chloride: 101 mmol/L (ref 96–106)
Creatinine, Ser: 0.98 mg/dL (ref 0.57–1.00)
Glucose: 96 mg/dL (ref 70–99)
Potassium: 4.2 mmol/L (ref 3.5–5.2)
Sodium: 140 mmol/L (ref 134–144)
eGFR: 61 mL/min/1.73 (ref >59)

## 2023-12-29 ENCOUNTER — Ambulatory Visit: Payer: Self-pay | Admitting: Physician Assistant

## 2024-01-01 ENCOUNTER — Other Ambulatory Visit: Payer: Self-pay | Admitting: Cardiology

## 2024-02-18 ENCOUNTER — Ambulatory Visit

## 2024-02-18 DIAGNOSIS — I428 Other cardiomyopathies: Secondary | ICD-10-CM | POA: Diagnosis not present

## 2024-02-19 LAB — CUP PACEART REMOTE DEVICE CHECK
Battery Remaining Longevity: 66 mo
Battery Remaining Percentage: 91 %
Battery Voltage: 2.99 V
Brady Statistic AP VP Percent: 28 %
Brady Statistic AP VS Percent: 1 %
Brady Statistic AS VP Percent: 69 %
Brady Statistic AS VS Percent: 1 %
Brady Statistic RA Percent Paced: 27 %
Date Time Interrogation Session: 20260109083848
HighPow Impedance: 56 Ohm
Lead Channel Impedance Value: 250 Ohm
Lead Channel Impedance Value: 390 Ohm
Lead Channel Impedance Value: 440 Ohm
Lead Channel Pacing Threshold Amplitude: 1 V
Lead Channel Pacing Threshold Amplitude: 1.25 V
Lead Channel Pacing Threshold Amplitude: 1.75 V
Lead Channel Pacing Threshold Pulse Width: 0.5 ms
Lead Channel Pacing Threshold Pulse Width: 0.5 ms
Lead Channel Pacing Threshold Pulse Width: 1 ms
Lead Channel Sensing Intrinsic Amplitude: 0.9 mV
Lead Channel Sensing Intrinsic Amplitude: 12 mV
Lead Channel Setting Pacing Amplitude: 2 V
Lead Channel Setting Pacing Amplitude: 2.25 V
Lead Channel Setting Pacing Amplitude: 2.5 V
Lead Channel Setting Pacing Pulse Width: 0.5 ms
Lead Channel Setting Pacing Pulse Width: 1 ms
Lead Channel Setting Sensing Sensitivity: 0.5 mV
Pulse Gen Serial Number: 211052366

## 2024-02-20 ENCOUNTER — Ambulatory Visit: Payer: Self-pay | Admitting: Cardiovascular Disease

## 2024-02-22 ENCOUNTER — Other Ambulatory Visit: Payer: Self-pay

## 2024-02-22 DIAGNOSIS — C50919 Malignant neoplasm of unspecified site of unspecified female breast: Secondary | ICD-10-CM | POA: Insufficient documentation

## 2024-02-22 DIAGNOSIS — M1990 Unspecified osteoarthritis, unspecified site: Secondary | ICD-10-CM | POA: Insufficient documentation

## 2024-02-22 DIAGNOSIS — J189 Pneumonia, unspecified organism: Secondary | ICD-10-CM | POA: Insufficient documentation

## 2024-02-22 DIAGNOSIS — N189 Chronic kidney disease, unspecified: Secondary | ICD-10-CM | POA: Insufficient documentation

## 2024-02-22 DIAGNOSIS — I679 Cerebrovascular disease, unspecified: Secondary | ICD-10-CM | POA: Insufficient documentation

## 2024-02-22 DIAGNOSIS — Z87442 Personal history of urinary calculi: Secondary | ICD-10-CM | POA: Insufficient documentation

## 2024-02-22 DIAGNOSIS — R7303 Prediabetes: Secondary | ICD-10-CM | POA: Insufficient documentation

## 2024-02-22 DIAGNOSIS — Z950 Presence of cardiac pacemaker: Secondary | ICD-10-CM | POA: Insufficient documentation

## 2024-02-23 NOTE — Progress Notes (Signed)
 Remote ICD Transmission

## 2024-03-03 NOTE — Progress Notes (Signed)
 " Cardiology Office Note:   Date:  03/06/2024  ID:  [name] [name], DOB July 19, 1950, MRN [id number] PCP: [name], MD  [facility] Providers Cardiologist:  Lynwood Schilling, MD Electrophysiologist:  Elspeth Sage, MD (Inactive) {  History of Present Illness:   [name] [name] is a 74 y.o. female who presents for follow up of cardiomyopathy.  She has a history of a nonischemic cardiomyopathy. Her ejection fraction was at one point in time 15 - 20%. A later ejection fraction was about 40% in 2015.     I sent her for a CPX in 2018 which demonstrated a good VO2 Max.  EF in 2023 was 35 - 40%. She has had an episode of amaurosis fugax.  Carotid doppler was unremarkable.  C reactive protein was upper limites of normal.  Sed rate was upper limits but down from previous.   She was on a lower dose of Xarelto  for previous LV clot.  I increased the dose after this .  I saw her after repeat echo that demonstrated the EF to be 30 - 35%.  I sent her to Dr. Cindie to talk about BiV upgrade.  I also referred her to neuro ophthalmology at [facility].      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  She is now status post epicardal lead placement.    She has post op AKI.  This was in August.  She was sent home off of Entresto  and spiro.    She presents for follow up.  She did have 1 episode of probably some amaurosis fugax with order of a decreased vision in 1 eye.  She has had extensive workup with no clear etiology and I am wondering if this is just potential atherosclerotic plaque but she is seeing her ophthalmologist again.  She thinks she has been a little stronger since she got her LV lead.  She has not had any new shortness of breath, PND or orthopnea.  She does have low blood pressures but she is actually not had any presyncope or syncope with this.  Even with her systolics in the 80s today she is not having any symptoms.  ROS:  As stated in the HPI and negative for all other systems.  Studies Reviewed:    EKG:     NA  Risk Assessment/Calculations:          Physical Exam:   VS:  BP (!) 84/48   Pulse 66   Ht 5' 8 (1.727 m)   Wt 147 lb (66.7 kg)   SpO2 97%   BMI 22.35 kg/m    Wt Readings from Last 3 Encounters:  03/04/24 147 lb (66.7 kg)  12/25/23 150 lb (68 kg)  12/22/23 146 lb (66.2 kg)     GEN: Well nourished, well developed in no acute distress NECK: No JVD; No carotid bruits CARDIAC: RRR, 3/6 holosystolic murmur radiating to the apex slightly to the axilla, no diastolic murmurs, rubs, gallops RESPIRATORY:  Clear to auscultation without rales, wheezing or rhonchi  ABDOMEN: Soft, non-tender, non-distended EXTREMITIES:  No edema; No deformity   ASSESSMENT AND PLAN:   CARDIOMYOPATHY, PRIMARY, DILATED:     At this point I cannot restart Entresto .  She is hypotensive.   I am going to leave her on Farxiga beta-blocker and spironolactone  unless her blood pressure drops more or she gets symptomatic at which point I stop the spironolactone .  I would like again to try to titrate the Entresto  back on eventually.  I think the Farxiga though the fourth drug added is important to continue because it has controlled her blood sugars.  Of note she would like to have referral to cardiac rehab.  ICD :     She is status post epicardial lead placement via thoracotomy in August 2025.  She is up-to-date with follow-up.    LV CLOT:    She tolerates anticoagulation and is up-to-date with blood work.    AMAOURSIS FUGAX:     She has had extensive workup.  No change in therapy.   Follow up with me in May  Signed, Lynwood Schilling, MD   "

## 2024-03-04 ENCOUNTER — Ambulatory Visit: Attending: Cardiology | Admitting: Cardiology

## 2024-03-04 ENCOUNTER — Encounter: Payer: Self-pay | Admitting: Cardiology

## 2024-03-04 VITALS — BP 84/48 | HR 66 | Ht 68.0 in | Wt 147.0 lb

## 2024-03-04 DIAGNOSIS — I5022 Chronic systolic (congestive) heart failure: Secondary | ICD-10-CM | POA: Diagnosis not present

## 2024-03-04 NOTE — Patient Instructions (Signed)
 Medication Instructions:  Your physician recommends that you continue on your current medications as directed. Please refer to the Current Medication list given to you today.  *If you need a refill on your cardiac medications before your next appointment, please call your pharmacy*  Lab Work: NONE If you have labs (blood work) drawn today and your tests are completely normal, you will receive your results only by: MyChart Message (if you have MyChart) OR A paper copy in the mail If you have any lab test that is abnormal or we need to change your treatment, we will call you to review the results.  Testing/Procedures: NONE  Follow-Up: At [facility], you and your health needs are our priority.  As part of our continuing mission to provide you with exceptional heart care, our providers are all part of one team.  This team includes your primary Cardiologist (physician) and Advanced Practice Providers or APPs (Physician Assistants and Nurse Practitioners) who all work together to provide you with the care you need, when you need it.  Your next appointment:   3 months  Provider:   Lynwood Schilling, MD    We recommend signing up for the patient portal called MyChart.  Sign up information is provided on this After Visit Summary.  MyChart is used to connect with patients for Virtual Visits (Telemedicine).  Patients are able to view lab/test results, encounter notes, upcoming appointments, etc.  Non-urgent messages can be sent to your provider as well.   To learn more about what you can do with MyChart, go to [email or web address].   Other Instructions You have been referred to Cardiopulmonary Rehab. Someone will reach out to you to make an appointment.

## 2024-03-06 ENCOUNTER — Encounter: Payer: Self-pay | Admitting: Cardiology

## 2024-03-09 ENCOUNTER — Encounter (HOSPITAL_COMMUNITY): Payer: Self-pay

## 2024-03-10 ENCOUNTER — Encounter (HOSPITAL_COMMUNITY)
Admission: RE | Admit: 2024-03-10 | Discharge: 2024-03-10 | Disposition: A | Discharge status: Home / Self Care | Source: Ambulatory Visit | Attending: Cardiology | Admitting: Cardiology

## 2024-03-10 DIAGNOSIS — I5022 Chronic systolic (congestive) heart failure: Secondary | ICD-10-CM | POA: Insufficient documentation

## 2024-03-10 NOTE — Progress Notes (Signed)
 Virtual orientation visit completed for cardiac rehab with chronic systolic HF. On-site orientation visit scheduled for 03/31/24 at 1:30.

## 2024-03-14 ENCOUNTER — Other Ambulatory Visit: Payer: Self-pay | Admitting: Cardiology

## 2024-03-14 MED ORDER — EMPAGLIFLOZIN 10 MG PO TABS: 10.0000 mg | ORAL_TABLET | Freq: Every day | ORAL | 0 refills | Status: AC

## 2024-03-15 ENCOUNTER — Telehealth (INDEPENDENT_AMBULATORY_CARE_PROVIDER_SITE_OTHER): Payer: Self-pay

## 2024-03-17 NOTE — Telephone Encounter (Signed)
 Please have patient seen  clinic with me or [facility]

## 2024-03-31 ENCOUNTER — Encounter (HOSPITAL_COMMUNITY)

## 2024-04-29 ENCOUNTER — Ambulatory Visit: Admitting: Physician Assistant

## 2024-05-19 ENCOUNTER — Encounter

## 2024-06-03 ENCOUNTER — Ambulatory Visit: Admitting: Cardiology

## 2024-08-18 ENCOUNTER — Encounter

## 2024-11-17 ENCOUNTER — Encounter

## 2025-02-16 ENCOUNTER — Encounter

## 2025-05-18 ENCOUNTER — Encounter
